# Patient Record
Sex: Male | Born: 1995 | Race: Black or African American | Hispanic: No | Marital: Single | Smoking: Current some day smoker
Health system: Southern US, Community
[De-identification: ages and names within clinical notes are randomized; demographics above are authoritative.]

## PROBLEM LIST (undated history)

## (undated) DIAGNOSIS — F32A Depression, unspecified: Secondary | ICD-10-CM

## (undated) DIAGNOSIS — Z931 Gastrostomy status: Secondary | ICD-10-CM

## (undated) HISTORY — DX: Gastrostomy status: Z93.1

---
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
## 2018-11-01 ENCOUNTER — Other Ambulatory Visit: Payer: Self-pay

## 2018-11-01 ENCOUNTER — Ambulatory Visit: Payer: Self-pay

## 2018-11-01 DIAGNOSIS — Z20822 Contact with and (suspected) exposure to covid-19: Secondary | ICD-10-CM

## 2018-11-02 LAB — NOVEL CORONAVIRUS, NAA: SARS-CoV-2, NAA: NOT DETECTED

## 2018-11-04 ENCOUNTER — Other Ambulatory Visit: Payer: Self-pay

## 2018-11-04 ENCOUNTER — Emergency Department
Admission: EM | Admit: 2018-11-04 | Discharge: 2018-11-04 | Disposition: A | Discharge status: Home / Self Care | Payer: Self-pay | Attending: Student in an Organized Health Care Education/Training Program | Admitting: Student in an Organized Health Care Education/Training Program

## 2018-11-04 ENCOUNTER — Encounter: Payer: Self-pay | Admitting: Emergency Medicine

## 2018-11-04 DIAGNOSIS — K0889 Other specified disorders of teeth and supporting structures: Secondary | ICD-10-CM | POA: Insufficient documentation

## 2018-11-04 MED ORDER — IBUPROFEN 600 MG PO TABS: 600.0000 mg | ORAL_TABLET | Freq: Three times a day (TID) | ORAL | 0 refills | Status: DC | PRN

## 2018-11-04 MED ORDER — IBUPROFEN 600 MG PO TABS
600.0000 mg | ORAL_TABLET | Freq: Once | ORAL | Status: AC
  Administered 2018-11-04: 11:00:00 600 mg via ORAL
  Filled 2018-11-04: qty 1

## 2018-11-04 MED ORDER — AMOXICILLIN 875 MG PO TABS: 875.0000 mg | ORAL_TABLET | Freq: Two times a day (BID) | ORAL | 0 refills | Status: DC

## 2018-11-04 NOTE — ED Triage Notes (Signed)
Pt arrives with complaints of dental pain and possible infection for the last week.

## 2018-11-04 NOTE — ED Provider Notes (Signed)
[facility] Emergency Department Provider Note   ____________________________________________   First MD Initiated Contact with Patient 11/04/18 1005     (approximate)  I have reviewed the triage vital signs and the nursing notes.   HISTORY  Chief Complaint Dental Pain   HPI Jeremy D Polo RileyWillis Jr. is a 23 y.o. male presents to the ED with complaint of dental pain.  Patient states that he chipped his tooth 2 months ago but has not seen a dentist since that time.  He now complains of pain and swelling in the gum surrounding this particular tooth.  He denies any fever or chills.  He continues to smoke cigarettes daily.  He rates his pain as 10/10.      History reviewed. No pertinent past medical history.  There are no active problems to display for this patient.   History reviewed. No pertinent surgical history.  Prior to Admission medications   Medication Sig Start Date End Date Taking? Authorizing Provider  amoxicillin (AMOXIL) 875 MG tablet Take 1 tablet (875 mg total) by mouth 2 (two) times daily. 11/04/18   Tommi RumpsSummers, Gunnison Chahal L, PA-C  ibuprofen (ADVIL) 600 MG tablet Take 1 tablet (600 mg total) by mouth every 8 (eight) hours as needed. 11/04/18   Tommi RumpsSummers, Ariea Rochin L, PA-C    Allergies Patient has no allergy information on record.  No family history on file.  Social History Social History   Tobacco Use  . Smoking status: Not on file  Substance Use Topics  . Alcohol use: Not on file  . Drug use: Not on file    Review of Systems Constitutional: No fever/chills Eyes: No visual changes. ENT: No sore throat.  Positive for dental pain. Cardiovascular: Denies chest pain. Respiratory: Denies shortness of breath. Musculoskeletal: Negative for muscle aches. Skin: Negative for rash. Neurological: Negative for headaches. ___________________________________________   PHYSICAL EXAM:  VITAL SIGNS: ED Triage Vitals  Enc Vitals Group     BP  11/04/18 0953 133/82     Pulse Rate 11/04/18 0953 69     Resp 11/04/18 0953 18     Temp 11/04/18 0953 99 F (37.2 C)     Temp Source 11/04/18 0953 Oral     SpO2 11/04/18 0953 99 %     Weight 11/04/18 0954 140 lb (63.5 kg)     Height 11/04/18 0954 6\' 2"  (1.88 m)     Head Circumference --      Peak Flow --      Pain Score 11/04/18 0951 10     Pain Loc --      Pain Edu? --      Excl. in GC? --     Constitutional: Alert and oriented. Well appearing and in no acute distress. Eyes: Conjunctivae are normal.  Head: Atraumatic. Mouth/Throat: Right lower molar the anterior portion of the tooth is avulsed with a cary present.  No obvious abscess or drainage is noted.  Minimal swelling with the gum in this area. Neck: No stridor.   Cardiovascular: Normal rate, regular rhythm. Grossly normal heart sounds.  Good peripheral circulation. Respiratory: Normal respiratory effort.  No retractions. Lungs CTAB. Musculoskeletal: Moves upper and lower extremities with any difficulty.  Normal gait was noted. Neurologic:  Normal speech and language. No gross focal neurologic deficits are appreciated.  Skin:  Skin is warm, dry and intact.  Psychiatric: Mood and affect are normal. Speech and behavior are normal.  ____________________________________________   LABS (all labs ordered are listed, but  only abnormal results are displayed)  Labs Reviewed - No data to display ____________________________________________    PROCEDURES  Procedure(s) performed (including Critical Care):  Procedures  ___________________________________________   INITIAL IMPRESSION / ASSESSMENT AND PLAN / ED COURSE  As part of my medical decision making, I reviewed the following data within the electronic MEDICAL RECORD NUMBER Notes from prior ED visits and [place] Controlled Substance Database  23 year old male presents to the ED with complaint of a dental injury that occurred 2 months ago and has had increased pain over the last  2 days which he feels is because of infection.  Patient has continued to smoke daily.  On exam there is a partial avulsion and cavity present.  There is no obvious edema or drainage noted at this area.  Patient was given prescription for amoxicillin 875 twice daily for 10 days and ibuprofen to be taken as needed for pain.  He was given a list of dental clinics in the area and encouraged strongly to follow-up with 1 of these.   ____________________________________________   FINAL CLINICAL IMPRESSION(S) / ED DIAGNOSES  Final diagnoses:  Pain, dental     ED Discharge Orders         Ordered    amoxicillin (AMOXIL) 875 MG tablet  2 times daily     11/04/18 1044    ibuprofen (ADVIL) 600 MG tablet  Every 8 hours PRN,   Status:  Discontinued     11/04/18 1044    ibuprofen (ADVIL) 600 MG tablet  Every 8 hours PRN     11/04/18 1045           Note:  This document was prepared using Dragon voice recognition software and may include unintentional dictation errors.    Johnn Hai, PA-C 11/04/18 1123    Merlyn Lot, MD 11/04/18 1341

## 2018-11-04 NOTE — Discharge Instructions (Addendum)
Begin taking the antibiotic and the ibuprofen for your dental pain.  A list of dental clinics are listed on your discharge papers.  Also [facility] has walk-in hours and is listed on a separate piece of paper.  OPTIONS FOR DENTAL FOLLOW UP CARE  [place] Department of Health and Human Services - Local Safety Net Dental Clinics TripDoors.comhttp://www.ncdhhs.gov/dph/oralhealth/services/safetynetclinics.htm   [facility] [phone])  [facility] [phone])  [facility] [place] [phone])  [facility] [phone])  [facility] [phone]) This clinic caters to the indigent population and is on a lottery system. Location: [employer], [address], [place] Clinic Hours: Wednesdays from 6pm - 9pm, patients seen by a lottery system. For dates, call or go to [email or web address] Services: Cleanings, fillings and simple extractions. Payment Options: DENTAL WORK IS FREE OF CHARGE. Bring proof of income or support. Best way to get seen: Arrive at 5:15 pm - this is a lottery, NOT first come/first serve, so arriving earlier will not increase your chances of being seen.     [facility] [phone] Select option 1 for emergencies   Location: [facility], [facility], [address], [place] Clinic Hours: No walk-ins accepted - call the day before to schedule an appointment. Check in times are 9:30 am and 1:30 pm. Services: Simple extractions, temporary fillings, pulpectomy/pulp debridement, uncomplicated abscess drainage. Payment Options: PAYMENT IS DUE AT THE TIME OF SERVICE.  Fee is usually $100-200, additional surgical procedures (e.g. abscess drainage) may be extra. Cash, checks, Visa/MasterCard accepted.  Can file Medicaid if patient is covered for dental - patient should call case worker to check. No discount for [facility] patients. Best way to get seen: MUST call the day before and get onto the schedule. Can usually be seen the next 1-2 days. No walk-ins accepted.     [facility] [phone]   Location: [facility], [address], [place] Clinic Hours: M, W, Th, F 8am or 1:30pm, Tues 9a or 1:30 - first come/first served. Services: Simple extractions, temporary fillings, uncomplicated abscess drainage.  You do not need to be an [place] resident. Payment Options: PAYMENT IS DUE AT THE TIME OF SERVICE. Dental insurance, otherwise sliding scale - bring proof of income or support. Depending on income and treatment needed, cost is usually $50-200. Best way to get seen: Arrive early as it is first come/first served.     [facility] [phone]   Location: [address] Clinic Hours: Mon-Thu 8a-5p Services: Most basic dental services including extractions and fillings. Payment Options: PAYMENT IS DUE AT THE TIME OF SERVICE. Sliding scale, up to 50% off - bring proof if income or support. Medicaid with dental option accepted. Best way to get seen: Call to schedule an appointment, can usually be seen within 2 weeks OR they will try to see walk-ins - show up at 8a or 2p (you may have to wait).     [facility] [phone] [place] RESIDENTS ONLY   Location: [facility], [address], [place], [place] [id number] Clinic Hours: By appointment only. Monday - Thursday 8am-5pm, Friday 8am-12pm Services: Cleanings, fillings, extractions. Payment Options: PAYMENT IS DUE AT THE TIME OF SERVICE. Cash, Visa or MasterCard. Sliding scale - $30 minimum per service. Best way to get seen: Come in to office, complete packet and make an appointment - need  proof of income or support monies for each household member and proof of [facility] residence. Usually takes  about a month to get in.     [facility] [phone]   Location: [address], [place] Clinic Hours: Walk-in Urgent Care Dental Services are offered Monday-Friday mornings only. The numbers of emergencies accepted daily is limited to the number of providers available. Maximum 15 - Mondays, Wednesdays & Thursdays Maximum 10 - Tuesdays & Fridays Services: You do not need to be a [facility] resident to be seen for a dental emergency. Emergencies are defined as pain, swelling, abnormal bleeding, or dental trauma. Walkins will receive x-rays if needed. NOTE: Dental cleaning is not an emergency. Payment Options: PAYMENT IS DUE AT THE TIME OF SERVICE. Minimum co-pay is $40.00 for uninsured patients. Minimum co-pay is $3.00 for Medicaid with dental coverage. Dental Insurance is accepted and must be presented at time of visit. Medicare does not cover dental. Forms of payment: Cash, credit card, checks. Best way to get seen: If not previously registered with the clinic, walk-in dental registration begins at 7:15 am and is on a first come/first serve basis. If previously registered with the clinic, call to make an appointment.     [facility] [place] ONLY   Location: [address] [place], [place] Clinic Hours: Mon-Thu 10a-2p Services: Extractions only! Payment Options: FREE (donations accepted) - bring proof of income or support Best way to get seen: Call and schedule an appointment OR come at 8am on the 1st Monday of every month (except for holidays) when it is first come/first served.     [facility] [phone]   Location: [place], [place] Clinic Hours: Friday mornings Services, Payment Options, Best way to get seen: Call for info

## 2018-11-04 NOTE — ED Notes (Signed)
See triage note  Presents with dental pain   Possible dental abscess  States he chipped a tooth about 2 months ago  But developed increased pain with some gum swelling couple of days ago

## 2018-11-05 ENCOUNTER — Ambulatory Visit: Payer: Self-pay

## 2018-11-06 ENCOUNTER — Ambulatory Visit: Payer: Self-pay

## 2019-07-22 ENCOUNTER — Emergency Department
Admission: EM | Admit: 2019-07-22 | Discharge: 2019-07-22 | Disposition: A | Discharge status: Home / Self Care | Payer: Self-pay | Attending: Emergency Medicine | Admitting: Emergency Medicine

## 2019-07-22 ENCOUNTER — Other Ambulatory Visit: Payer: Self-pay

## 2019-07-22 ENCOUNTER — Encounter: Payer: Self-pay | Admitting: Emergency Medicine

## 2019-07-22 DIAGNOSIS — K047 Periapical abscess without sinus: Secondary | ICD-10-CM

## 2019-07-22 DIAGNOSIS — F17210 Nicotine dependence, cigarettes, uncomplicated: Secondary | ICD-10-CM | POA: Insufficient documentation

## 2019-07-22 LAB — CBC WITH DIFFERENTIAL/PLATELET
Abs Immature Granulocytes: 0.03 10*3/uL (ref 0.00–0.07)
Basophils Absolute: 0 10*3/uL (ref 0.0–0.1)
Basophils Relative: 1 %
Eosinophils Absolute: 0.1 10*3/uL (ref 0.0–0.5)
Eosinophils Relative: 2 %
HCT: 45.5 % (ref 39.0–52.0)
Hemoglobin: 15.3 g/dL (ref 13.0–17.0)
Immature Granulocytes: 1 %
Lymphocytes Relative: 30 %
Lymphs Abs: 1.7 10*3/uL (ref 0.7–4.0)
MCH: 28.4 pg (ref 26.0–34.0)
MCHC: 33.6 g/dL (ref 30.0–36.0)
MCV: 84.6 fL (ref 80.0–100.0)
Monocytes Absolute: 0.7 10*3/uL (ref 0.1–1.0)
Monocytes Relative: 12 %
Neutro Abs: 3.2 10*3/uL (ref 1.7–7.7)
Neutrophils Relative %: 54 %
Platelets: 235 10*3/uL (ref 150–400)
RBC: 5.38 MIL/uL (ref 4.22–5.81)
RDW: 13.1 % (ref 11.5–15.5)
WBC: 5.8 10*3/uL (ref 4.0–10.5)
nRBC: 0 % (ref 0.0–0.2)

## 2019-07-22 LAB — BASIC METABOLIC PANEL
Anion gap: 8 (ref 5–15)
BUN: 10 mg/dL (ref 6–20)
CO2: 25 mmol/L (ref 22–32)
Calcium: 9 mg/dL (ref 8.9–10.3)
Chloride: 106 mmol/L (ref 98–111)
Creatinine, Ser: 0.83 mg/dL (ref 0.61–1.24)
GFR calc Af Amer: >60 mL/min (ref >60)
GFR calc non Af Amer: >60 mL/min (ref >60)
Glucose, Bld: 92 mg/dL (ref 70–99)
Potassium: 4.2 mmol/L (ref 3.5–5.1)
Sodium: 139 mmol/L (ref 135–145)

## 2019-07-22 MED ORDER — SODIUM CHLORIDE 0.9 % IV SOLN
1.0000 g | Freq: Once | INTRAVENOUS | Status: AC
  Administered 2019-07-22: 1 g via INTRAVENOUS
  Filled 2019-07-22: qty 10

## 2019-07-22 MED ORDER — HYDROCODONE-ACETAMINOPHEN 5-325 MG PO TABS: 1.0000 | ORAL_TABLET | Freq: Four times a day (QID) | ORAL | 0 refills | Status: DC | PRN

## 2019-07-22 MED ORDER — CLINDAMYCIN HCL 150 MG PO CAPS
300.0000 mg | ORAL_CAPSULE | Freq: Three times a day (TID) | ORAL | 0 refills | Status: DC
Start: 2019-07-22 — End: 2020-02-12

## 2019-07-22 NOTE — ED Triage Notes (Signed)
Pt here for jaw swelling.  Reports woke up this way.  Hx of dental problems on this side and has needed abx in past.  Has not went to dentist for teeth causing problems.  No fever. No vision changes.

## 2019-07-22 NOTE — Discharge Instructions (Addendum)
OPTIONS FOR DENTAL FOLLOW UP CARE ° °[place] Department of Health and Human Services - Local Safety Net Dental Clinics °http://www.ncdhhs.gov/dph/oralhealth/services/safetynetclinics.htm °  °[facility] ([phone]) ° °[facility] ([phone]) ° °[facility] ([phone]) ° °[facility] ([phone]) ° °[facility] ([phone]) °This clinic caters to the indigent population and is on a lottery system. °Location: °[facility], [facility], [address], [place] °Clinic Hours: °Wednesdays from 6pm - 9pm, patients seen by a lottery system. °For dates, call or go to [email or web address] °Services: °Cleanings, fillings and simple extractions. °Payment Options: °DENTAL WORK IS FREE OF CHARGE. Bring proof of income or support. °Best way to get seen: °Arrive at 5:15 pm - this is a lottery, NOT first come/first serve, so arriving earlier will not increase your chances of being seen. °  °  °[facility] °[phone] °Select option 1 for emergencies °  °Location: °[facility], [address], [place] °Clinic Hours: °No walk-ins accepted - call the day before to schedule an appointment. °Check in times are 9:30 am and 1:30 pm. °Services: °Simple extractions, temporary fillings, pulpectomy/pulp debridement, uncomplicated abscess drainage. °Payment Options: °PAYMENT IS DUE AT THE TIME OF SERVICE.  Fee is usually $100-200, additional surgical procedures (e.g. abscess drainage) may be extra. °Cash, checks, Visa/MasterCard accepted.  Can file Medicaid if patient is covered for dental - patient should call case worker to check. °No discount for [employer] patients. °Best way to get seen: °MUST call the day before and get onto the schedule. Can usually be seen the next 1-2 days. No walk-ins accepted. °  °  °[facility] °[phone] °   °Location: °[facility], [address], [place] °Clinic Hours: °M, W, Th, F 8am or 1:30pm, Tues 9a or 1:30 - first come/first served. °Services: °Simple extractions, temporary fillings, uncomplicated abscess drainage.  You do not need to be an [place] resident. °Payment Options: °PAYMENT IS DUE AT THE TIME OF SERVICE. °Dental insurance, otherwise sliding scale - bring proof of income or support. °Depending on income and treatment needed, cost is usually $50-200. °Best way to get seen: °Arrive early as it is first come/first served. °  °  °[facility] °[phone] °  °Location: °[address] °Clinic Hours: °Mon-Thu 8a-5p °Services: °Most basic dental services including extractions and fillings. °Payment Options: °PAYMENT IS DUE AT THE TIME OF SERVICE. °Sliding scale, up to 50% off - bring proof if income or support. °Medicaid with dental option accepted. °Best way to get seen: °Call to schedule an appointment, can usually be seen within 2 weeks OR they will try to see walk-ins - show up at 8a or 2p (you may have to wait). °  °  °[facility] °[phone] °[place] RESIDENTS ONLY °  °Location: °[facility], [address], [place],  [address] °Clinic Hours: By appointment only. °Monday - Thursday 8am-5pm, Friday 8am-12pm °Services: Cleanings, fillings, extractions. °Payment Options: °PAYMENT IS DUE AT THE TIME OF SERVICE. °Cash, Visa or MasterCard. Sliding scale - $30 minimum per service. °Best way to get seen: °Come in to office, complete packet and make an appointment - need proof of income °or support monies for each household member and proof of [place] residence. °Usually takes about a month to get in. °  °  °[facility] °[phone] °  °Location: °[address]   [place] °Clinic Hours: Walk-in Urgent Care Dental Services are offered Monday-Friday  mornings only. °The numbers of emergencies accepted daily is limited to the number of °providers available. °Maximum 15 - Mondays, Wednesdays & Thursdays °Maximum 10 - Tuesdays & Fridays °Services: °You do not need to be a [place] resident to be seen for a dental emergency. °Emergencies are defined as pain, swelling, abnormal bleeding, or dental trauma. Walkins will receive x-rays if needed. °NOTE: Dental cleaning is not an emergency. °Payment Options: °PAYMENT IS DUE AT THE TIME OF SERVICE. °Minimum co-pay is $40.00 for uninsured patients. °Minimum co-pay is $3.00 for Medicaid with dental coverage. °Dental Insurance is accepted and must be presented at time of visit. °Medicare does not cover dental. °Forms of payment: Cash, credit card, checks. °Best way to get seen: °If not previously registered with the clinic, walk-in dental registration begins at 7:15 am and is on a first come/first serve basis. °If previously registered with the clinic, call to make an appointment. °  °  °[facility] °[phone] °[place] RESIDENTS ONLY °  °Location: °[address], [place],  °Clinic Hours: °Mon-Thu 10a-2p °Services: Extractions only! °Payment Options: °FREE (donations accepted) - bring proof of income or support °Best way to get seen: °Call and schedule an appointment OR come at 8am on the 1st Monday of every month (except for holidays) when it is first come/first served. °  °  °[facility] °[phone] °  °Location: °[address], [place] °Clinic Hours: °Friday mornings °Services, Payment Options, Best way to get seen: °Call for info °

## 2019-07-22 NOTE — ED Provider Notes (Signed)
[facility] Emergency Department Provider Note  ____________________________________________   First MD Initiated Contact with Patient 07/22/19 1035     (approximate)  I have reviewed the triage vital signs and the nursing notes.   HISTORY  Chief Complaint Dental Problem    HPI Jeremy George. is a 24 y.o. male presents emergency department complaining of a swollen face due to a cracked tooth.  States been on amoxicillin for 2 days and swelling has gotten worse.  He denies any fever or chills.   No pain at this time   History reviewed. No pertinent past medical history.  There are no problems to display for this patient.   History reviewed. No pertinent surgical history.  Prior to Admission medications   Medication Sig Start Date End Date Taking? Authorizing Provider  clindamycin (CLEOCIN) 150 MG capsule Take 2 capsules (300 mg total) by mouth 3 (three) times daily. 07/22/19   Oiva Dibari, Roselyn Bering, PA-C  HYDROcodone-acetaminophen (NORCO/VICODIN) 5-325 MG tablet Take 1 tablet by mouth every 6 (six) hours as needed for moderate pain. 07/22/19   Faythe Ghee, PA-C    Allergies Tramadol  History reviewed. No pertinent family history.  Social History Social History   Tobacco Use  . Smoking status: Current Every Day Smoker  . Smokeless tobacco: Never Used  Substance Use Topics  . Alcohol use: Yes  . Drug use: Yes    Types: Marijuana    Review of Systems  Constitutional: No fever/chills Eyes: No visual changes. ENT: No sore throat.  Positive facial swelling Respiratory: Denies cough Cardiovascular: Denies chest pain Gastrointestinal: Denies abdominal pain Genitourinary: Negative for dysuria. Musculoskeletal: Negative for back pain. Skin: Negative for rash. Psychiatric: no mood changes,     ____________________________________________   PHYSICAL EXAM:  VITAL SIGNS: ED Triage Vitals  Enc Vitals Group     BP 07/22/19 1026  108/75     Pulse Rate 07/22/19 1025 93     Resp 07/22/19 1025 16     Temp 07/22/19 1025 98.9 F (37.2 C)     Temp Source 07/22/19 1025 Oral     SpO2 07/22/19 1025 98 %     Weight 07/22/19 1025 140 lb (63.5 kg)     Height 07/22/19 1025 6\' 2"  (1.88 m)     Head Circumference --      Peak Flow --      Pain Score 07/22/19 1025 0     Pain Loc --      Pain Edu? --      Excl. in GC? --     Constitutional: Alert and oriented. Well appearing and in no acute distress. Eyes: Conjunctivae are normal.  Head: Atraumatic.  Large swelling noted to the great side of the face along the right jaw maxillary area Nose: No congestion/rhinnorhea. Mouth/Throat: Mucous membranes are moist.  Poor dentition noted, swelling noted at the gumline and into the right cheek Neck:  supple no lymphadenopathy noted Cardiovascular: Normal rate, regular rhythm. Heart sounds are normal Respiratory: Normal respiratory effort.  No retractions, lungs c t a  GU: deferred Musculoskeletal: FROM all extremities, warm and well perfused Neurologic:  Normal speech and language.  Skin:  Skin is warm, dry and intact. No rash noted. Psychiatric: Mood and affect are normal. Speech and behavior are normal.  ____________________________________________   LABS (all labs ordered are listed, but only abnormal results are displayed)  Labs Reviewed  CBC WITH DIFFERENTIAL/PLATELET  BASIC METABOLIC PANEL   ____________________________________________  ____________________________________________  RADIOLOGY    ____________________________________________   PROCEDURES  Procedure(s) performed: No  Procedures    ____________________________________________   INITIAL IMPRESSION / ASSESSMENT AND PLAN / ED COURSE  Pertinent labs & imaging results that were available during my care of the patient were reviewed by me and considered in my medical decision making (see chart for details).   Patient is 24 year old male  presents emergency department with concerns of dental abscess.  Is been taking amoxicillin for 2 days.  No relief.  See HPI.  Physical exam is consistent with a large dental abscess.  The right side of the face is grossly swollen.  No lymphadenopathy noted in the cervical chain.  Remainder exams are unremarkable  Due to the amount of infection and the fact that the patient has been on amoxicillin we will do IV Rocephin.  Labs ordered for CBC and metabolic panel.  CBC metabolic panel normal which are reassuring.  Is given a prescription for clindamycin and hydrocodone.  He is to follow-up his regular doctor if not improving in 2 to 3 days.  Return emergency department worsening.  Encouraged him to follow-up with one of the discounted dental clinics and a list was provided for him.  He was discharged stable condition.   Jeremy George. was evaluated in Emergency Department on 07/22/2019 for the symptoms described in the history of present illness. He was evaluated in the context of the global COVID-19 pandemic, which necessitated consideration that the patient might be at risk for infection with the SARS-CoV-2 virus that causes COVID-19. Institutional protocols and algorithms that pertain to the evaluation of patients at risk for COVID-19 are in a state of rapid change based on information released by regulatory bodies including the CDC and federal and state organizations. These policies and algorithms were followed during the patient's care in the ED.   As part of my medical decision making, I reviewed the following data within the [place] notes reviewed and incorporated, Labs reviewed , Old chart reviewed, Notes from prior ED visits and [place] Controlled Substance Database  ____________________________________________   FINAL CLINICAL IMPRESSION(S) / ED DIAGNOSES  Final diagnoses:  Dental abscess      NEW MEDICATIONS STARTED DURING THIS VISIT:  New Prescriptions     CLINDAMYCIN (CLEOCIN) 150 MG CAPSULE    Take 2 capsules (300 mg total) by mouth 3 (three) times daily.   HYDROCODONE-ACETAMINOPHEN (NORCO/VICODIN) 5-325 MG TABLET    Take 1 tablet by mouth every 6 (six) hours as needed for moderate pain.     Note:  This document was prepared using Dragon voice recognition software and may include unintentional dictation errors.    Versie Starks, PA-C 07/22/19 1131    Harvest Dark, MD 07/22/19 [phone]

## 2019-07-22 NOTE — ED Notes (Signed)
See triage note  States he has a cracked tooth   Woke up with possible  Facial swelling

## 2019-10-23 ENCOUNTER — Other Ambulatory Visit: Payer: Self-pay

## 2019-10-23 ENCOUNTER — Other Ambulatory Visit: Payer: Self-pay | Admitting: Sleep Medicine

## 2019-10-23 DIAGNOSIS — I471 Supraventricular tachycardia, unspecified: Secondary | ICD-10-CM

## 2019-10-25 LAB — NOVEL CORONAVIRUS, NAA: SARS-CoV-2, NAA: NOT DETECTED

## 2020-02-12 ENCOUNTER — Emergency Department: Payer: Self-pay

## 2020-02-12 ENCOUNTER — Other Ambulatory Visit: Payer: Self-pay

## 2020-02-12 ENCOUNTER — Emergency Department
Admission: EM | Admit: 2020-02-12 | Discharge: 2020-02-12 | Disposition: A | Discharge status: Home / Self Care | Payer: Self-pay | Attending: Emergency Medicine | Admitting: Emergency Medicine

## 2020-02-12 DIAGNOSIS — R4182 Altered mental status, unspecified: Secondary | ICD-10-CM | POA: Insufficient documentation

## 2020-02-12 DIAGNOSIS — Z789 Other specified health status: Secondary | ICD-10-CM

## 2020-02-12 DIAGNOSIS — F17200 Nicotine dependence, unspecified, uncomplicated: Secondary | ICD-10-CM | POA: Insufficient documentation

## 2020-02-12 DIAGNOSIS — F1290 Cannabis use, unspecified, uncomplicated: Secondary | ICD-10-CM | POA: Insufficient documentation

## 2020-02-12 DIAGNOSIS — F1099 Alcohol use, unspecified with unspecified alcohol-induced disorder: Secondary | ICD-10-CM | POA: Insufficient documentation

## 2020-02-12 LAB — COMPREHENSIVE METABOLIC PANEL WITH GFR
ALT: 20 U/L (ref 0–44)
AST: 30 U/L (ref 15–41)
Albumin: 4.4 g/dL (ref 3.5–5.0)
Alkaline Phosphatase: 62 U/L (ref 38–126)
Anion gap: 10 (ref 5–15)
BUN: 10 mg/dL (ref 6–20)
CO2: 24 mmol/L (ref 22–32)
Calcium: 8.9 mg/dL (ref 8.9–10.3)
Chloride: 108 mmol/L (ref 98–111)
Creatinine, Ser: 0.79 mg/dL (ref 0.61–1.24)
GFR, Estimated: >60 mL/min
Glucose, Bld: 96 mg/dL (ref 70–99)
Potassium: 3.9 mmol/L (ref 3.5–5.1)
Sodium: 142 mmol/L (ref 135–145)
Total Bilirubin: 0.7 mg/dL (ref 0.3–1.2)
Total Protein: 7.6 g/dL (ref 6.5–8.1)

## 2020-02-12 LAB — CBC WITH DIFFERENTIAL/PLATELET
Abs Immature Granulocytes: 0.03 K/uL (ref 0.00–0.07)
Basophils Absolute: 0 K/uL (ref 0.0–0.1)
Basophils Relative: 1 %
Eosinophils Absolute: 0.1 K/uL (ref 0.0–0.5)
Eosinophils Relative: 2 %
HCT: 43.6 % (ref 39.0–52.0)
Hemoglobin: 14.6 g/dL (ref 13.0–17.0)
Immature Granulocytes: 1 %
Lymphocytes Relative: 34 %
Lymphs Abs: 1.6 K/uL (ref 0.7–4.0)
MCH: 28.9 pg (ref 26.0–34.0)
MCHC: 33.5 g/dL (ref 30.0–36.0)
MCV: 86.2 fL (ref 80.0–100.0)
Monocytes Absolute: 0.4 K/uL (ref 0.1–1.0)
Monocytes Relative: 7 %
Neutro Abs: 2.7 K/uL (ref 1.7–7.7)
Neutrophils Relative %: 55 %
Platelets: 234 K/uL (ref 150–400)
RBC: 5.06 MIL/uL (ref 4.22–5.81)
RDW: 13.5 % (ref 11.5–15.5)
WBC: 4.8 K/uL (ref 4.0–10.5)
nRBC: 0 % (ref 0.0–0.2)

## 2020-02-12 LAB — URINE DRUG SCREEN, QUALITATIVE (ARMC ONLY)
Amphetamines, Ur Screen: NOT DETECTED
Barbiturates, Ur Screen: NOT DETECTED
Benzodiazepine, Ur Scrn: NOT DETECTED
Cannabinoid 50 Ng, Ur ~~LOC~~: POSITIVE — AB
Cocaine Metabolite,Ur ~~LOC~~: NOT DETECTED
MDMA (Ecstasy)Ur Screen: NOT DETECTED
Methadone Scn, Ur: NOT DETECTED
Opiate, Ur Screen: NOT DETECTED
Phencyclidine (PCP) Ur S: NOT DETECTED
Tricyclic, Ur Screen: NOT DETECTED

## 2020-02-12 LAB — ETHANOL: Alcohol, Ethyl (B): 191 mg/dL — ABNORMAL HIGH

## 2020-02-12 NOTE — ED Notes (Signed)
[name] is at bedside, pt now in agreement with work up as planned

## 2020-02-12 NOTE — Discharge Instructions (Addendum)
Please seek medical attention for any high fevers, chest pain, shortness of breath, change in behavior, persistent vomiting, bloody stool or any other new or concerning symptoms.  

## 2020-02-12 NOTE — ED Notes (Signed)
Pt refusing to go get CT done; Dr. Derrill Kay notified

## 2020-02-12 NOTE — ED Notes (Signed)
Pt able to ambulate to toilet in room, provided urine specimen

## 2020-02-12 NOTE — ED Notes (Signed)
Pt given gingerale and graham crackers, encouraged to try some

## 2020-02-12 NOTE — ED Provider Notes (Signed)
[facility] Emergency Department Provider Note    ____________________________________________   I have reviewed the triage vital signs and the nursing notes.   HISTORY  Chief Complaint Altered Mental Status   History limited by and level 5 caveat due to: Altered Mental Status   HPI Jeremy George. is a 24 y.o. male who presents to the emergency department today via EMS from work because of concern for altered mental status. The patient himself either will not or cannot give any significant history. No similar presentations in [facility] EMR. Per report patient was given narcan during transport without any significant change.   Records reviewed. Per medical record review patient has been seen in the ED for dental pain but no recent record of any altered mental status.   Prior to Admission medications   Medication Sig Start Date End Date Taking? Authorizing Provider  clindamycin (CLEOCIN) 150 MG capsule Take 2 capsules (300 mg total) by mouth 3 (three) times daily. 07/22/19   Fisher, Roselyn Bering, PA-C  HYDROcodone-acetaminophen (NORCO/VICODIN) 5-325 MG tablet Take 1 tablet by mouth every 6 (six) hours as needed for moderate pain. 07/22/19   Faythe Ghee, PA-C    Allergies Tramadol  No family history on file.  Social History Social History   Tobacco Use   Smoking status: Current Every Day Smoker   Smokeless tobacco: Never Used  Substance Use Topics   Alcohol use: Yes   Drug use: Yes    Types: Marijuana    Review of Systems Unable to obtain secondary to altered mental status.  ____________________________________________   PHYSICAL EXAM:  VITAL SIGNS: ED Triage Vitals  Enc Vitals Group     BP 02/12/20 1503 112/70     Pulse Rate 02/12/20 1503 74     Resp 02/12/20 1503 16     Temp 02/12/20 1503 97.7 F (36.5 C)     Temp Source 02/12/20 1503 Oral     SpO2 02/12/20 1503 96 %     Weight --      Height 02/12/20 1506 6\' 2"  (1.88 m)      Head Circumference --      Peak Flow --      Pain Score 02/12/20 1506 0   Constitutional: Somnolent, will awaken to physical stimuli.   Eyes: Conjunctivae are normal. Pupils dilated and reactive.  ENT      Head: Normocephalic and atraumatic.      Nose: No congestion/rhinnorhea.      Mouth/Throat: Mucous membranes are moist.      Neck: No stridor. Hematological/Lymphatic/Immunilogical: No cervical lymphadenopathy. Cardiovascular: Normal rate, regular rhythm.  No murmurs, rubs, or gallops.  Respiratory: Normal respiratory effort without tachypnea nor retractions. Breath sounds are clear and equal bilaterally. No wheezes/rales/rhonchi. Gastrointestinal: Soft and non tender. No rebound. No guarding.  Genitourinary: Deferred Musculoskeletal: Normal range of motion in all extremities. No lower extremity edema. Neurologic:  Somnolent. Responds and awakens to physical stimuli. Will move all extremities. Skin:  Skin is warm, dry and intact. No rash noted.  ____________________________________________    LABS (pertinent positives/negatives)  UDS positive cannabinoid Ethanol 191 CMP wnl CBC wbc 4.8, hgb 14.6, plt 234   ____________________________________________    RADIOLOGY  CT head Normal non contrast head ct  ____________________________________________   PROCEDURES  Procedures  ____________________________________________   INITIAL IMPRESSION / ASSESSMENT AND PLAN / ED COURSE  Pertinent labs & imaging results that were available during my care of the patient were reviewed by me  and considered in my medical decision making (see chart for details).   Patient presented to the emergency department today because of concerns for altered mental status.  Patient was not able to give a good history when he first arrived.  Because this broad altered mental status work-up was initiated.  Head CT was negative for any acute bleed or mass.  Patient's blood work was notable for  elevated ethanol.  UDS positive for cannabinoid.  This point do think polysubstance use could explain the patient's symptoms. Patient was observed in the emergency department for a number of hours. Did become more responsive. Did try to encourage decreased substance use. Will give PCP follow up information.   ____________________________________________   FINAL CLINICAL IMPRESSION(S) / ED DIAGNOSES  Final diagnoses:  Altered mental status, unspecified altered mental status type  Alcohol use  Marijuana use     Note: This dictation was prepared with Dragon dictation. Any transcriptional errors that result from this process are unintentional     Phineas Semen, MD 02/12/20 2237

## 2020-02-12 NOTE — ED Notes (Signed)
Labs recollected and sent to lab 

## 2020-02-12 NOTE — ED Triage Notes (Signed)
Pt found to be with altered mental status at work, on EMS arrival is able to open eyes by not willing to participate in cares, not talking. Received 1mg  Narcan en route

## 2020-02-12 NOTE — ED Notes (Signed)
Patient transported to CT 

## 2020-03-29 ENCOUNTER — Inpatient Hospital Stay (HOSPITAL_COMMUNITY)
Admission: EM | Admit: 2020-03-29 | Discharge: 2020-04-27 | DRG: 003 | Disposition: A | Discharge status: Rehabilitation Facility | Payer: Medicaid Other | Attending: General Surgery | Admitting: General Surgery

## 2020-03-29 ENCOUNTER — Encounter (HOSPITAL_COMMUNITY): Admission: EM | Disposition: A | Discharge status: Rehabilitation Facility | Payer: Self-pay | Source: Home / Self Care

## 2020-03-29 ENCOUNTER — Emergency Department (HOSPITAL_COMMUNITY): Payer: Medicaid Other

## 2020-03-29 ENCOUNTER — Encounter (HOSPITAL_COMMUNITY): Payer: Self-pay

## 2020-03-29 ENCOUNTER — Inpatient Hospital Stay (HOSPITAL_COMMUNITY): Payer: Medicaid Other

## 2020-03-29 ENCOUNTER — Inpatient Hospital Stay (HOSPITAL_COMMUNITY): Payer: Medicaid Other | Admitting: Anesthesiology

## 2020-03-29 DIAGNOSIS — S01511A Laceration without foreign body of lip, initial encounter: Secondary | ICD-10-CM

## 2020-03-29 DIAGNOSIS — T1490XA Injury, unspecified, initial encounter: Secondary | ICD-10-CM

## 2020-03-29 DIAGNOSIS — R451 Restlessness and agitation: Secondary | ICD-10-CM | POA: Diagnosis not present

## 2020-03-29 DIAGNOSIS — J939 Pneumothorax, unspecified: Secondary | ICD-10-CM

## 2020-03-29 DIAGNOSIS — S42402A Unspecified fracture of lower end of left humerus, initial encounter for closed fracture: Secondary | ICD-10-CM

## 2020-03-29 DIAGNOSIS — Z931 Gastrostomy status: Secondary | ICD-10-CM

## 2020-03-29 DIAGNOSIS — R140 Abdominal distension (gaseous): Secondary | ICD-10-CM

## 2020-03-29 DIAGNOSIS — S02412A LeFort II fracture, initial encounter for closed fracture: Secondary | ICD-10-CM

## 2020-03-29 DIAGNOSIS — J9690 Respiratory failure, unspecified, unspecified whether with hypoxia or hypercapnia: Secondary | ICD-10-CM

## 2020-03-29 DIAGNOSIS — S02413A LeFort III fracture, initial encounter for closed fracture: Secondary | ICD-10-CM

## 2020-03-29 DIAGNOSIS — T07XXXA Unspecified multiple injuries, initial encounter: Principal | ICD-10-CM

## 2020-03-29 DIAGNOSIS — S0121XA Laceration without foreign body of nose, initial encounter: Secondary | ICD-10-CM

## 2020-03-29 DIAGNOSIS — S73015A Posterior dislocation of left hip, initial encounter: Secondary | ICD-10-CM

## 2020-03-29 DIAGNOSIS — T68XXXA Hypothermia, initial encounter: Secondary | ICD-10-CM

## 2020-03-29 DIAGNOSIS — S42402B Unspecified fracture of lower end of left humerus, initial encounter for open fracture: Secondary | ICD-10-CM

## 2020-03-29 DIAGNOSIS — Z930 Tracheostomy status: Secondary | ICD-10-CM

## 2020-03-29 DIAGNOSIS — S42412B Displaced simple supracondylar fracture without intercondylar fracture of left humerus, initial encounter for open fracture: Secondary | ICD-10-CM

## 2020-03-29 DIAGNOSIS — R739 Hyperglycemia, unspecified: Secondary | ICD-10-CM

## 2020-03-29 DIAGNOSIS — Z452 Encounter for adjustment and management of vascular access device: Secondary | ICD-10-CM

## 2020-03-29 DIAGNOSIS — S01412A Laceration without foreign body of left cheek and temporomandibular area, initial encounter: Secondary | ICD-10-CM

## 2020-03-29 DIAGNOSIS — D62 Acute posthemorrhagic anemia: Secondary | ICD-10-CM

## 2020-03-29 DIAGNOSIS — Z419 Encounter for procedure for purposes other than remedying health state, unspecified: Secondary | ICD-10-CM

## 2020-03-29 DIAGNOSIS — T148XXA Other injury of unspecified body region, initial encounter: Secondary | ICD-10-CM

## 2020-03-29 DIAGNOSIS — S42001A Fracture of unspecified part of right clavicle, initial encounter for closed fracture: Secondary | ICD-10-CM

## 2020-03-29 DIAGNOSIS — S32409A Unspecified fracture of unspecified acetabulum, initial encounter for closed fracture: Secondary | ICD-10-CM

## 2020-03-29 DIAGNOSIS — S062X9A Diffuse traumatic brain injury with loss of consciousness of unspecified duration, initial encounter: Secondary | ICD-10-CM

## 2020-03-29 DIAGNOSIS — K9423 Gastrostomy malfunction: Secondary | ICD-10-CM

## 2020-03-29 DIAGNOSIS — D72829 Elevated white blood cell count, unspecified: Secondary | ICD-10-CM

## 2020-03-29 DIAGNOSIS — S0292XA Unspecified fracture of facial bones, initial encounter for closed fracture: Secondary | ICD-10-CM

## 2020-03-29 DIAGNOSIS — S52272C Monteggia's fracture of left ulna, initial encounter for open fracture type IIIA, IIIB, or IIIC: Secondary | ICD-10-CM

## 2020-03-29 DIAGNOSIS — J189 Pneumonia, unspecified organism: Secondary | ICD-10-CM

## 2020-03-29 DIAGNOSIS — J9600 Acute respiratory failure, unspecified whether with hypoxia or hypercapnia: Secondary | ICD-10-CM

## 2020-03-29 DIAGNOSIS — R0682 Tachypnea, not elsewhere classified: Secondary | ICD-10-CM

## 2020-03-29 DIAGNOSIS — S73005A Unspecified dislocation of left hip, initial encounter: Secondary | ICD-10-CM

## 2020-03-29 DIAGNOSIS — S32422A Displaced fracture of posterior wall of left acetabulum, initial encounter for closed fracture: Secondary | ICD-10-CM

## 2020-03-29 DIAGNOSIS — L8990 Pressure ulcer of unspecified site, unspecified stage: Secondary | ICD-10-CM | POA: Insufficient documentation

## 2020-03-29 DIAGNOSIS — R030 Elevated blood-pressure reading, without diagnosis of hypertension: Secondary | ICD-10-CM

## 2020-03-29 DIAGNOSIS — S32402A Unspecified fracture of left acetabulum, initial encounter for closed fracture: Secondary | ICD-10-CM

## 2020-03-29 DIAGNOSIS — S81012A Laceration without foreign body, left knee, initial encounter: Secondary | ICD-10-CM

## 2020-03-29 DIAGNOSIS — S270XXA Traumatic pneumothorax, initial encounter: Secondary | ICD-10-CM | POA: Diagnosis not present

## 2020-03-29 DIAGNOSIS — R402212 Coma scale, best verbal response, none, at arrival to emergency department: Secondary | ICD-10-CM | POA: Diagnosis present

## 2020-03-29 DIAGNOSIS — S022XXA Fracture of nasal bones, initial encounter for closed fracture: Secondary | ICD-10-CM | POA: Diagnosis present

## 2020-03-29 DIAGNOSIS — S2231XA Fracture of one rib, right side, initial encounter for closed fracture: Secondary | ICD-10-CM | POA: Diagnosis not present

## 2020-03-29 DIAGNOSIS — X31XXXA Exposure to excessive natural cold, initial encounter: Secondary | ICD-10-CM | POA: Diagnosis not present

## 2020-03-29 DIAGNOSIS — R569 Unspecified convulsions: Secondary | ICD-10-CM | POA: Diagnosis not present

## 2020-03-29 DIAGNOSIS — R402112 Coma scale, eyes open, never, at arrival to emergency department: Secondary | ICD-10-CM | POA: Diagnosis present

## 2020-03-29 DIAGNOSIS — S27322A Contusion of lung, bilateral, initial encounter: Secondary | ICD-10-CM | POA: Diagnosis not present

## 2020-03-29 DIAGNOSIS — S42422B Displaced comminuted supracondylar fracture without intercondylar fracture of left humerus, initial encounter for open fracture: Secondary | ICD-10-CM | POA: Diagnosis not present

## 2020-03-29 DIAGNOSIS — Z20822 Contact with and (suspected) exposure to covid-19: Secondary | ICD-10-CM | POA: Diagnosis present

## 2020-03-29 DIAGNOSIS — R9431 Abnormal electrocardiogram [ECG] [EKG]: Secondary | ICD-10-CM | POA: Diagnosis present

## 2020-03-29 DIAGNOSIS — I959 Hypotension, unspecified: Secondary | ICD-10-CM | POA: Diagnosis present

## 2020-03-29 DIAGNOSIS — K567 Ileus, unspecified: Secondary | ICD-10-CM | POA: Diagnosis not present

## 2020-03-29 DIAGNOSIS — S42001D Fracture of unspecified part of right clavicle, subsequent encounter for fracture with routine healing: Secondary | ICD-10-CM | POA: Diagnosis not present

## 2020-03-29 DIAGNOSIS — S0285XA Fracture of orbit, unspecified, initial encounter for closed fracture: Secondary | ICD-10-CM | POA: Diagnosis not present

## 2020-03-29 DIAGNOSIS — S06A0XD Traumatic brain compression without herniation, subsequent encounter: Secondary | ICD-10-CM | POA: Diagnosis not present

## 2020-03-29 DIAGNOSIS — J45909 Unspecified asthma, uncomplicated: Secondary | ICD-10-CM | POA: Diagnosis present

## 2020-03-29 DIAGNOSIS — E162 Hypoglycemia, unspecified: Secondary | ICD-10-CM | POA: Diagnosis present

## 2020-03-29 DIAGNOSIS — S0240FA Zygomatic fracture, left side, initial encounter for closed fracture: Secondary | ICD-10-CM | POA: Diagnosis present

## 2020-03-29 DIAGNOSIS — Y92410 Unspecified street and highway as the place of occurrence of the external cause: Secondary | ICD-10-CM | POA: Diagnosis not present

## 2020-03-29 DIAGNOSIS — S52022B Displaced fracture of olecranon process without intraarticular extension of left ulna, initial encounter for open fracture type I or II: Secondary | ICD-10-CM | POA: Diagnosis not present

## 2020-03-29 DIAGNOSIS — R6339 Other feeding difficulties: Secondary | ICD-10-CM | POA: Diagnosis not present

## 2020-03-29 DIAGNOSIS — S42402K Unspecified fracture of lower end of left humerus, subsequent encounter for fracture with nonunion: Secondary | ICD-10-CM | POA: Diagnosis not present

## 2020-03-29 DIAGNOSIS — N179 Acute kidney failure, unspecified: Secondary | ICD-10-CM | POA: Diagnosis not present

## 2020-03-29 DIAGNOSIS — S0240CA Maxillary fracture, right side, initial encounter for closed fracture: Secondary | ICD-10-CM | POA: Diagnosis not present

## 2020-03-29 DIAGNOSIS — J9601 Acute respiratory failure with hypoxia: Secondary | ICD-10-CM | POA: Diagnosis not present

## 2020-03-29 DIAGNOSIS — Z430 Encounter for attention to tracheostomy: Secondary | ICD-10-CM | POA: Diagnosis not present

## 2020-03-29 DIAGNOSIS — S066X9A Traumatic subarachnoid hemorrhage with loss of consciousness of unspecified duration, initial encounter: Secondary | ICD-10-CM | POA: Diagnosis present

## 2020-03-29 DIAGNOSIS — S022XXD Fracture of nasal bones, subsequent encounter for fracture with routine healing: Secondary | ICD-10-CM | POA: Diagnosis not present

## 2020-03-29 DIAGNOSIS — I10 Essential (primary) hypertension: Secondary | ICD-10-CM | POA: Diagnosis present

## 2020-03-29 DIAGNOSIS — E876 Hypokalemia: Secondary | ICD-10-CM | POA: Diagnosis present

## 2020-03-29 DIAGNOSIS — S73005D Unspecified dislocation of left hip, subsequent encounter: Secondary | ICD-10-CM | POA: Diagnosis not present

## 2020-03-29 DIAGNOSIS — F10239 Alcohol dependence with withdrawal, unspecified: Secondary | ICD-10-CM | POA: Diagnosis not present

## 2020-03-29 DIAGNOSIS — S0240DA Maxillary fracture, left side, initial encounter for closed fracture: Secondary | ICD-10-CM | POA: Diagnosis not present

## 2020-03-29 DIAGNOSIS — Z23 Encounter for immunization: Secondary | ICD-10-CM

## 2020-03-29 DIAGNOSIS — S02412D LeFort II fracture, subsequent encounter for fracture with routine healing: Secondary | ICD-10-CM | POA: Diagnosis not present

## 2020-03-29 DIAGNOSIS — R402312 Coma scale, best motor response, none, at arrival to emergency department: Secondary | ICD-10-CM | POA: Diagnosis not present

## 2020-03-29 DIAGNOSIS — R404 Transient alteration of awareness: Secondary | ICD-10-CM | POA: Diagnosis not present

## 2020-03-29 DIAGNOSIS — R456 Violent behavior: Secondary | ICD-10-CM | POA: Diagnosis not present

## 2020-03-29 DIAGNOSIS — S52272A Monteggia's fracture of left ulna, initial encounter for closed fracture: Secondary | ICD-10-CM | POA: Diagnosis present

## 2020-03-29 DIAGNOSIS — D696 Thrombocytopenia, unspecified: Secondary | ICD-10-CM | POA: Diagnosis present

## 2020-03-29 DIAGNOSIS — Y906 Blood alcohol level of 120-199 mg/100 ml: Secondary | ICD-10-CM | POA: Diagnosis present

## 2020-03-29 DIAGNOSIS — R1312 Dysphagia, oropharyngeal phase: Secondary | ICD-10-CM | POA: Diagnosis present

## 2020-03-29 DIAGNOSIS — S32059D Unspecified fracture of fifth lumbar vertebra, subsequent encounter for fracture with routine healing: Secondary | ICD-10-CM | POA: Diagnosis not present

## 2020-03-29 DIAGNOSIS — D75839 Thrombocytosis, unspecified: Secondary | ICD-10-CM | POA: Diagnosis present

## 2020-03-29 DIAGNOSIS — S06899A Other specified intracranial injury with loss of consciousness of unspecified duration, initial encounter: Secondary | ICD-10-CM | POA: Diagnosis not present

## 2020-03-29 DIAGNOSIS — L89892 Pressure ulcer of other site, stage 2: Secondary | ICD-10-CM | POA: Diagnosis not present

## 2020-03-29 DIAGNOSIS — S42021A Displaced fracture of shaft of right clavicle, initial encounter for closed fracture: Secondary | ICD-10-CM | POA: Diagnosis present

## 2020-03-29 DIAGNOSIS — S062X9D Diffuse traumatic brain injury with loss of consciousness of unspecified duration, subsequent encounter: Secondary | ICD-10-CM | POA: Diagnosis not present

## 2020-03-29 DIAGNOSIS — E559 Vitamin D deficiency, unspecified: Secondary | ICD-10-CM | POA: Diagnosis present

## 2020-03-29 DIAGNOSIS — S32059A Unspecified fracture of fifth lumbar vertebra, initial encounter for closed fracture: Secondary | ICD-10-CM | POA: Diagnosis not present

## 2020-03-29 DIAGNOSIS — R0902 Hypoxemia: Secondary | ICD-10-CM | POA: Diagnosis not present

## 2020-03-29 DIAGNOSIS — R1310 Dysphagia, unspecified: Secondary | ICD-10-CM | POA: Diagnosis not present

## 2020-03-29 DIAGNOSIS — R4189 Other symptoms and signs involving cognitive functions and awareness: Secondary | ICD-10-CM | POA: Diagnosis present

## 2020-03-29 DIAGNOSIS — F17210 Nicotine dependence, cigarettes, uncomplicated: Secondary | ICD-10-CM | POA: Diagnosis present

## 2020-03-29 DIAGNOSIS — R4020 Unspecified coma: Secondary | ICD-10-CM | POA: Diagnosis not present

## 2020-03-29 HISTORY — PX: I & D EXTREMITY: SHX5045

## 2020-03-29 HISTORY — PX: APPLICATION OF WOUND VAC: SHX5189

## 2020-03-29 HISTORY — PX: IRRIGATION AND DEBRIDEMENT KNEE: SHX5185

## 2020-03-29 HISTORY — PX: HIP CLOSED REDUCTION: SHX983

## 2020-03-29 HISTORY — PX: LACERATION REPAIR: SHX5284

## 2020-03-29 HISTORY — PX: EXTERNAL FIXATION LEG: SHX1549

## 2020-03-29 HISTORY — DX: Unspecified fracture of lower end of left humerus, initial encounter for closed fracture: S42.402A

## 2020-03-29 HISTORY — DX: Fracture of unspecified part of right clavicle, initial encounter for closed fracture: S42.001A

## 2020-03-29 HISTORY — DX: Displaced fracture of posterior wall of left acetabulum, initial encounter for closed fracture: S32.422A

## 2020-03-29 HISTORY — DX: Displaced simple supracondylar fracture without intercondylar fracture of left humerus, initial encounter for open fracture: S42.412B

## 2020-03-29 HISTORY — DX: Monteggia's fracture of left ulna, initial encounter for open fracture type IIIA, IIIB, or IIIC: S52.272C

## 2020-03-29 HISTORY — DX: Unspecified dislocation of left hip, initial encounter: S73.005A

## 2020-03-29 LAB — URINALYSIS, ROUTINE W REFLEX MICROSCOPIC
Bilirubin Urine: NEGATIVE
Glucose, UA: 150 mg/dL — AB
Ketones, ur: NEGATIVE mg/dL
Leukocytes,Ua: NEGATIVE
Nitrite: NEGATIVE
Protein, ur: 100 mg/dL — AB
RBC / HPF: >50 RBC/hpf — ABNORMAL HIGH (ref 0–5)
Specific Gravity, Urine: >1.046 — ABNORMAL HIGH (ref 1.005–1.030)
pH: 5 (ref 5.0–8.0)

## 2020-03-29 LAB — I-STAT CHEM 8, ED
BUN: 10 mg/dL (ref 6–20)
Calcium, Ion: 0.97 mmol/L — ABNORMAL LOW (ref 1.15–1.40)
Chloride: 108 mmol/L (ref 98–111)
Creatinine, Ser: 1.3 mg/dL — ABNORMAL HIGH (ref 0.61–1.24)
Glucose, Bld: 275 mg/dL — ABNORMAL HIGH (ref 70–99)
HCT: 38 % — ABNORMAL LOW (ref 39.0–52.0)
Hemoglobin: 12.9 g/dL — ABNORMAL LOW (ref 13.0–17.0)
Potassium: 2.6 mmol/L — CL (ref 3.5–5.1)
Sodium: 142 mmol/L (ref 135–145)
TCO2: 15 mmol/L — ABNORMAL LOW (ref 22–32)

## 2020-03-29 LAB — CDS SEROLOGY

## 2020-03-29 LAB — I-STAT ARTERIAL BLOOD GAS, ED
Acid-base deficit: 15 mmol/L — ABNORMAL HIGH (ref 0.0–2.0)
Bicarbonate: 15.3 mmol/L — ABNORMAL LOW (ref 20.0–28.0)
Calcium, Ion: 1.12 mmol/L — ABNORMAL LOW (ref 1.15–1.40)
HCT: 32 % — ABNORMAL LOW (ref 39.0–52.0)
Hemoglobin: 10.9 g/dL — ABNORMAL LOW (ref 13.0–17.0)
O2 Saturation: 100 %
Patient temperature: 98
Potassium: 2.5 mmol/L — CL (ref 3.5–5.1)
Sodium: 141 mmol/L (ref 135–145)
TCO2: 17 mmol/L — ABNORMAL LOW (ref 22–32)
pCO2 arterial: 57.1 mmHg — ABNORMAL HIGH (ref 32.0–48.0)
pH, Arterial: 7.033 — CL (ref 7.350–7.450)
pO2, Arterial: 270 mmHg — ABNORMAL HIGH (ref 83.0–108.0)

## 2020-03-29 LAB — POCT I-STAT 7, (LYTES, BLD GAS, ICA,H+H)
Acid-Base Excess: 0 mmol/L (ref 0.0–2.0)
Acid-base deficit: 15 mmol/L — ABNORMAL HIGH (ref 0.0–2.0)
Bicarbonate: 14.5 mmol/L — ABNORMAL LOW (ref 20.0–28.0)
Bicarbonate: 23.6 mmol/L (ref 20.0–28.0)
Calcium, Ion: 1.13 mmol/L — ABNORMAL LOW (ref 1.15–1.40)
Calcium, Ion: 0.81 mmol/L — CL (ref 1.15–1.40)
HCT: 24 % — ABNORMAL LOW (ref 39.0–52.0)
HCT: 31 % — ABNORMAL LOW (ref 39.0–52.0)
Hemoglobin: 8.2 g/dL — ABNORMAL LOW (ref 13.0–17.0)
Hemoglobin: 10.5 g/dL — ABNORMAL LOW (ref 13.0–17.0)
O2 Saturation: 100 %
O2 Saturation: 100 %
Patient temperature: 33.6
Patient temperature: 38.7
Potassium: 4 mmol/L (ref 3.5–5.1)
Potassium: 6.1 mmol/L — ABNORMAL HIGH (ref 3.5–5.1)
Sodium: 145 mmol/L (ref 135–145)
Sodium: 153 mmol/L — ABNORMAL HIGH (ref 135–145)
TCO2: 16 mmol/L — ABNORMAL LOW (ref 22–32)
TCO2: 25 mmol/L (ref 22–32)
pCO2 arterial: 42.7 mmHg (ref 32.0–48.0)
pCO2 arterial: 35.2 mmHg (ref 32.0–48.0)
pH, Arterial: 7.118 — CL (ref 7.350–7.450)
pH, Arterial: 7.441 (ref 7.350–7.450)
pO2, Arterial: 459 mmHg — ABNORMAL HIGH (ref 83.0–108.0)
pO2, Arterial: 185 mmHg — ABNORMAL HIGH (ref 83.0–108.0)

## 2020-03-29 LAB — POCT I-STAT, CHEM 8
BUN: 7 mg/dL (ref 6–20)
Calcium, Ion: 1.06 mmol/L — ABNORMAL LOW (ref 1.15–1.40)
Chloride: 111 mmol/L (ref 98–111)
Creatinine, Ser: 0.9 mg/dL (ref 0.61–1.24)
Glucose, Bld: 96 mg/dL (ref 70–99)
HCT: 20 % — ABNORMAL LOW (ref 39.0–52.0)
Hemoglobin: 6.8 g/dL — CL (ref 13.0–17.0)
Potassium: 3.9 mmol/L (ref 3.5–5.1)
Sodium: 146 mmol/L — ABNORMAL HIGH (ref 135–145)
TCO2: 20 mmol/L — ABNORMAL LOW (ref 22–32)

## 2020-03-29 LAB — GLUCOSE, CAPILLARY
Glucose-Capillary: 57 mg/dL — ABNORMAL LOW (ref 70–99)
Glucose-Capillary: 131 mg/dL — ABNORMAL HIGH (ref 70–99)
Glucose-Capillary: 88 mg/dL (ref 70–99)

## 2020-03-29 LAB — PROTIME-INR
INR: 1.5 — ABNORMAL HIGH (ref 0.8–1.2)
INR: 1.4 — ABNORMAL HIGH (ref 0.8–1.2)
Prothrombin Time: 17.8 seconds — ABNORMAL HIGH (ref 11.4–15.2)
Prothrombin Time: 16.8 seconds — ABNORMAL HIGH (ref 11.4–15.2)

## 2020-03-29 LAB — COMPREHENSIVE METABOLIC PANEL
ALT: 43 U/L (ref 0–44)
AST: 142 U/L — ABNORMAL HIGH (ref 15–41)
Albumin: 2.7 g/dL — ABNORMAL LOW (ref 3.5–5.0)
Alkaline Phosphatase: 53 U/L (ref 38–126)
Anion gap: 18 — ABNORMAL HIGH (ref 5–15)
BUN: 9 mg/dL (ref 6–20)
CO2: 12 mmol/L — ABNORMAL LOW (ref 22–32)
Calcium: 7.1 mg/dL — ABNORMAL LOW (ref 8.9–10.3)
Chloride: 111 mmol/L (ref 98–111)
Creatinine, Ser: 1.07 mg/dL (ref 0.61–1.24)
GFR, Estimated: >60 mL/min (ref >60)
Glucose, Bld: 297 mg/dL — ABNORMAL HIGH (ref 70–99)
Potassium: 2.9 mmol/L — ABNORMAL LOW (ref 3.5–5.1)
Sodium: 141 mmol/L (ref 135–145)
Total Bilirubin: 0.3 mg/dL (ref 0.3–1.2)
Total Protein: 4.5 g/dL — ABNORMAL LOW (ref 6.5–8.1)

## 2020-03-29 LAB — CBC
HCT: 39.2 % (ref 39.0–52.0)
HCT: 31 % — ABNORMAL LOW (ref 39.0–52.0)
Hemoglobin: 12.1 g/dL — ABNORMAL LOW (ref 13.0–17.0)
Hemoglobin: 11 g/dL — ABNORMAL LOW (ref 13.0–17.0)
MCH: 28.6 pg (ref 26.0–34.0)
MCH: 30 pg (ref 26.0–34.0)
MCHC: 30.9 g/dL (ref 30.0–36.0)
MCHC: 35.5 g/dL (ref 30.0–36.0)
MCV: 92.7 fL (ref 80.0–100.0)
MCV: 84.5 fL (ref 80.0–100.0)
Platelets: 210 10*3/uL (ref 150–400)
Platelets: 118 10*3/uL — ABNORMAL LOW (ref 150–400)
RBC: 4.23 MIL/uL (ref 4.22–5.81)
RBC: 3.67 MIL/uL — ABNORMAL LOW (ref 4.22–5.81)
RDW: 13.2 % (ref 11.5–15.5)
RDW: 13.8 % (ref 11.5–15.5)
WBC: 30.7 10*3/uL — ABNORMAL HIGH (ref 4.0–10.5)
WBC: 7.7 10*3/uL (ref 4.0–10.5)
nRBC: 0 % (ref 0.0–0.2)
nRBC: 0 % (ref 0.0–0.2)

## 2020-03-29 LAB — ETHANOL: Alcohol, Ethyl (B): 184 mg/dL — ABNORMAL HIGH (ref <10)

## 2020-03-29 LAB — LACTIC ACID, PLASMA: Lactic Acid, Venous: 8.4 mmol/L (ref 0.5–1.9)

## 2020-03-29 LAB — ABO/RH: ABO/RH(D): O POS

## 2020-03-29 LAB — SARS CORONAVIRUS 2 BY RT PCR (HOSPITAL ORDER, PERFORMED IN ~~LOC~~ HOSPITAL LAB): SARS Coronavirus 2: NEGATIVE

## 2020-03-29 LAB — HEMOGLOBIN A1C
Hgb A1c MFr Bld: 5 % (ref 4.8–5.6)
Mean Plasma Glucose: 96.8 mg/dL

## 2020-03-29 IMAGING — CT CT CHEST-ABD-PELV W/ CM
2 of 5 series · 12 of 36 positions shown, 14 images · IV contrast (APPLIED)
Comparison: Trauma series radiographs today.

CLINICAL DATA: Study discussed by telephone with Dr. FANNI on

EXAM:
CT CHEST, ABDOMEN, AND PELVIS WITH CONTRAST
TECHNIQUE: Multidetector CT imaging of the chest, abdomen and pelvis was
performed following the standard protocol during bolus
administration of intravenous contrast.
CONTRAST:  125mL OMNIPAQUE IOHEXOL 350 MG/ML SOLN

[Series 4: coronal · coronal · 0.82mm/px · 3 of 134 slices shown]
[im 27/134  mediastinal]
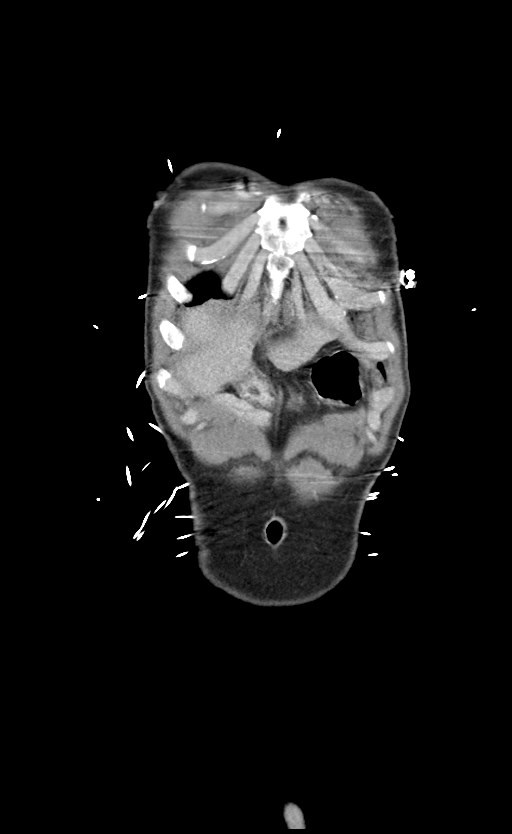
[im 54/134  mediastinal]
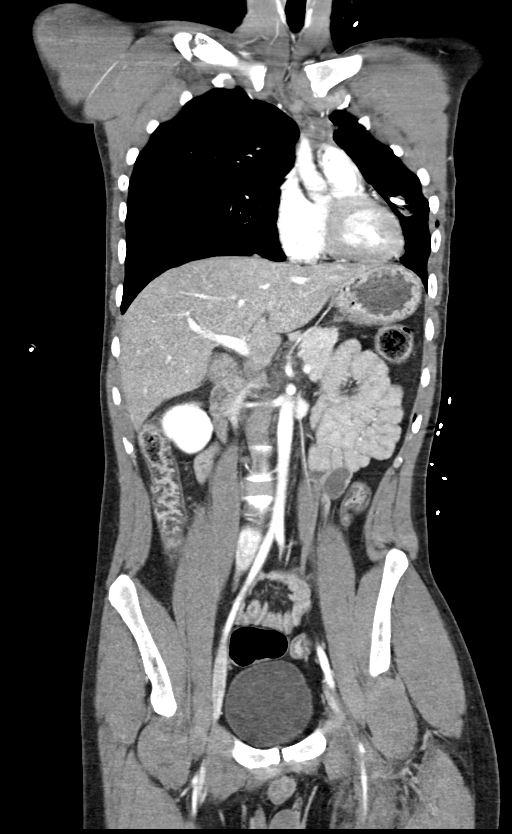
[im 80/134  mediastinal]
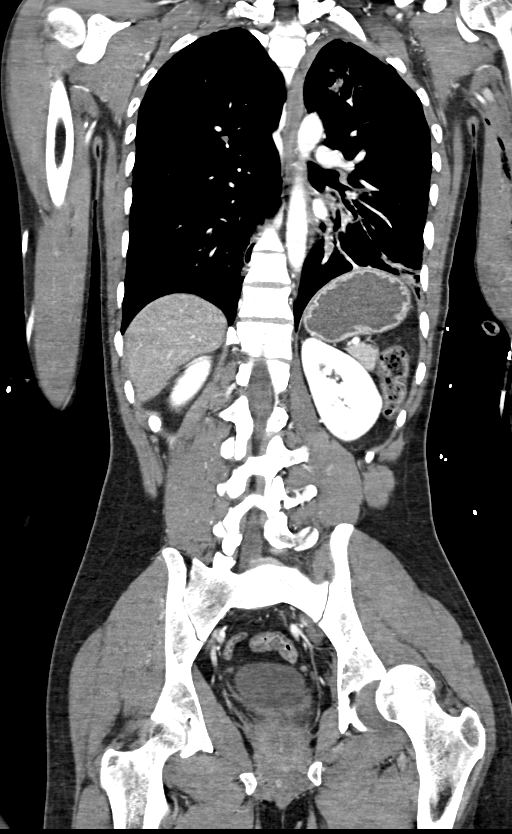

[Series 5: cap 5.0 i31f 2 · axial · 0.76mm/px · z∈[-798,-252]mm · 9 of 137 slices shown, 11 images]
[im 14/137  mediastinal]
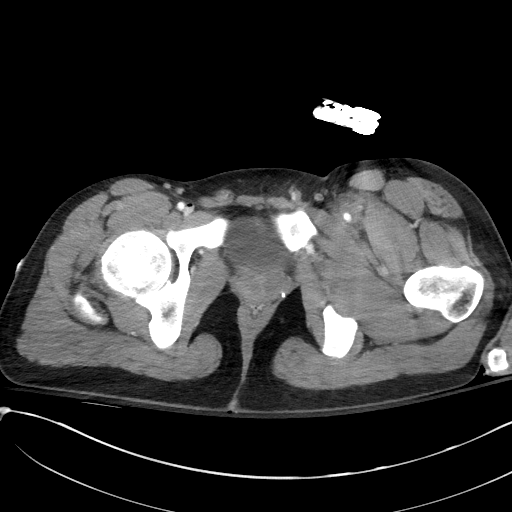
[im 14/137  bone]
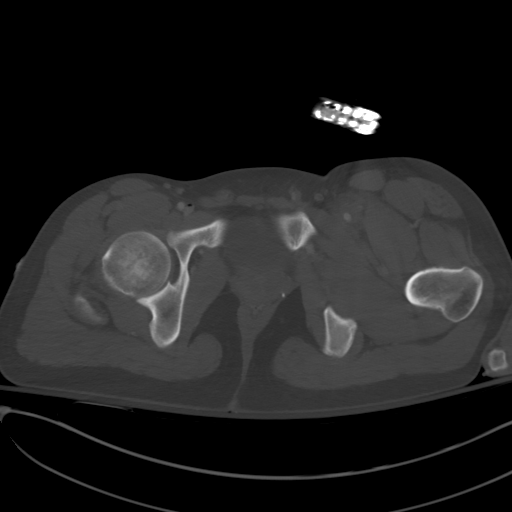
[im 28/137  mediastinal]
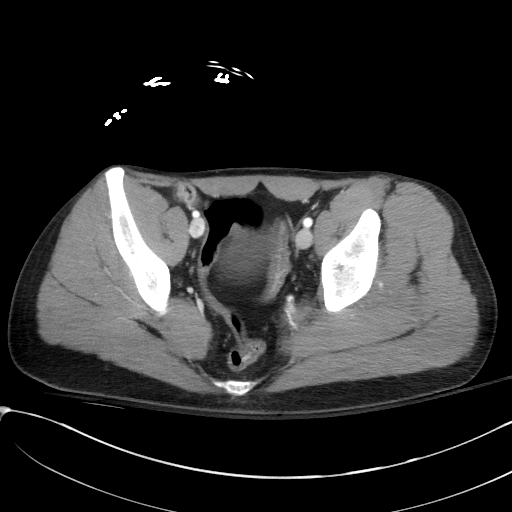
[im 41/137  mediastinal]
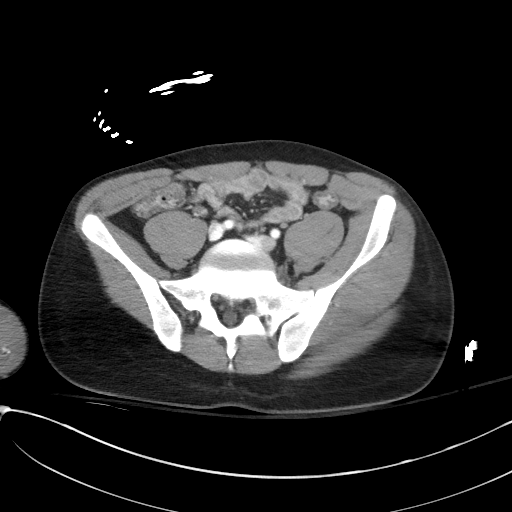
[im 55/137  mediastinal]
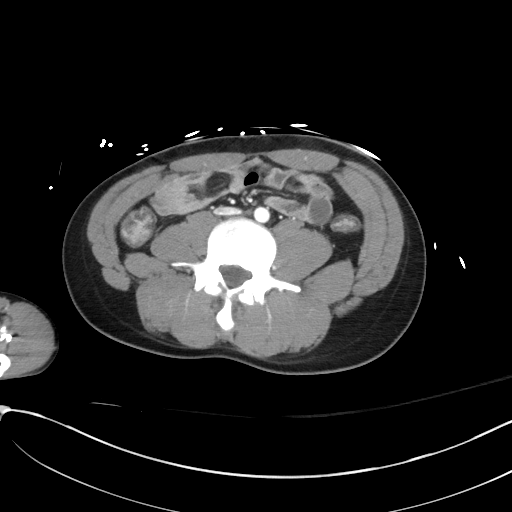
[im 69/137  mediastinal]
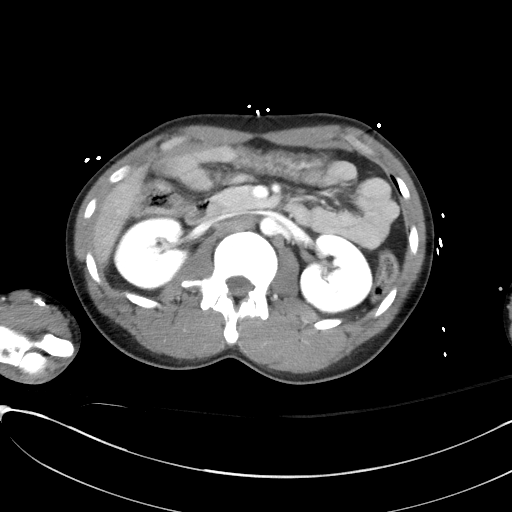
[im 82/137  mediastinal]
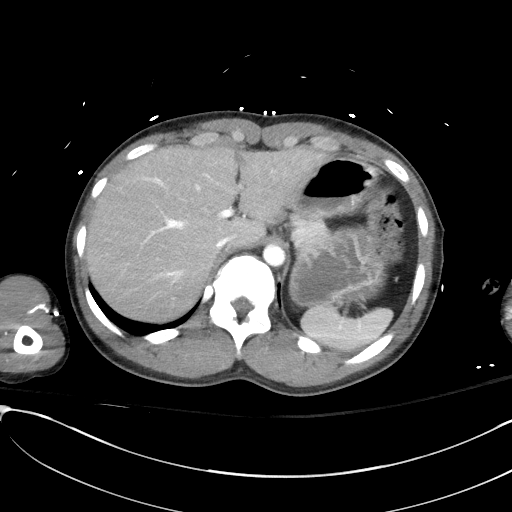
[im 96/137  mediastinal]
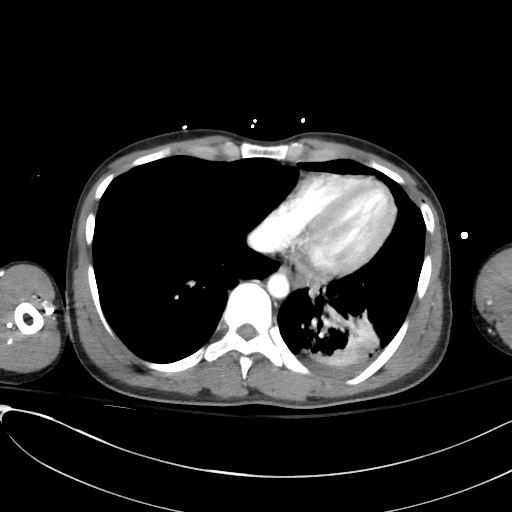
[im 109/137  mediastinal]
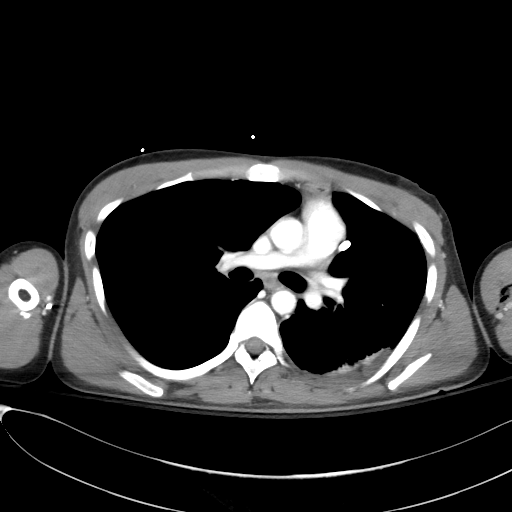
[im 123/137  mediastinal]
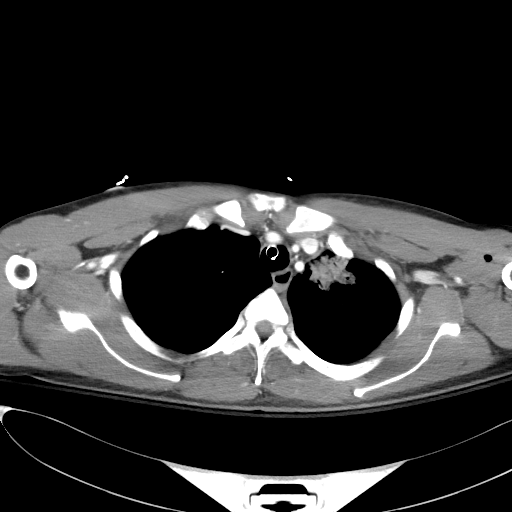
[im 123/137  bone]
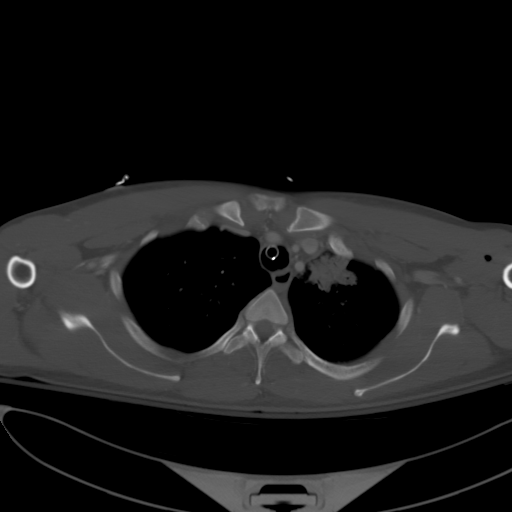

[12 of 36 positions shown; findings below may reference images not displayed]

Cervical spine CT
today reported separately. CTA left lower extremity reported
separately today.
FINDINGS: CT CHEST FINDINGS

Cardiovascular: No cardiomegaly or pericardial effusion. No thoracic
aortic injury identified. Central pulmonary arteries appear patent.

Mediastinum/Nodes: Leftward mediastinal shift. No mediastinal
hematoma identified.

Lungs/Pleura: Endotracheal tube terminates above the carina. Major
airways remain patent.

Confluent patchy contusion in the anterior left lung apex (series 6,
image 36). Additional enhancing left lower lobe consolidation. Left
lateral approach chest tube in place, terminates along the medial
left apex. Small volume residual left pneumothorax at the
cardiophrenic angle (series 6, image 90). Small volume left layering
hemothorax.

Small additional areas of scattered pulmonary contusion in the left
lung, including some along the left lingula course of the tube
(series 6, image 87).

Mildly hyperexpanded right lung with a medial right lower lobe
pulmonary laceration (series 6, image 110), small volume
posttraumatic gas and contusion at that site. But no superimposed
right pneumothorax.

Small areas of additional mild right lower lobe contusion.

Musculoskeletal: Mildly comminuted minimally displaced right mid
clavicle fracture.

Other visible shoulder osseous structures appear intact.

No thoracic vertebral fracture identified.

Motion artifact through the lower sternum. No convincing sternal
fracture.

No displaced left rib fracture. Small volume inter costal space gas
at the level of the left chest tube.

Mildly displaced greenstick type fracture of the right anterior 2nd
rib (series 6, image 53). No other right rib fracture identified.

CT ABDOMEN PELVIS FINDINGS

Hepatobiliary: Mild motion artifact. No liver or gallbladder injury
identified. No perihepatic free fluid.

Pancreas: Homogeneous enhancement, appears intact.

Spleen: Diminutive. No splenic injury identified. No perisplenic
fluid.

Adrenals/Urinary Tract: Normal adrenal glands.

Hyperenhancement of both kidneys. Symmetric renal contrast
excretion. No renal injury or obstruction. No perinephric stranding.
Proximal ureters appear within normal limits.

Diminutive urinary bladder appears intact.

Stomach/Bowel: Decompressed large and small bowel throughout the
abdomen and pelvis. No pneumoperitoneum. No free fluid identified.

Motion artifact at the hepatic flexure. No convincing hepatic
flexure injury.

Mild to moderate fluid distension of the stomach. No convincing
gastric or duodenal injury.

Vascular/Lymphatic: Abdominal aorta and major arterial structures in
the abdomen and pelvis are patent. There is irregularity of the
distal left external iliac artery which continues into the visible
proximal femoral arteries. See left lower extremity CTA reported
separately.

Portal venous system appears patent.  No lymphadenopathy.

Reproductive: Negative.

Other: No pelvic free fluid.

Musculoskeletal: Nondisplaced fracture left L5 transverse process.
Other lumbar levels appear intact.

Sacrum and SI joints intact.

Right hemipelvis, pubic symphysis, proximal right femur intact.

Posteriorly dislocated left femoral head, impacted on the posterior
left acetabulum. Superimposed small curvilinear avulsion bone
fragment is present along the roof of the left acetabulum, donor
site uncertain. No superimposed fracture of the proximal left femur
identified. No other fracture of the left acetabulum or left
hemipelvis. Pubic rami appear intact.

Asymmetric swelling of the musculature about the left hip and
proximal thigh compatible with intramuscular hematoma. Hemorrhage
within the left hip joint space.
IMPRESSION: CHEST:

1. Left chest tube in place with small residual pneumothorax, small
volume layering left hemothorax.
2. Multilobar left lung pulmonary contusions plus lower lobe
aspiration or atelectasis.
3. Medial right lower lobe pulmonary laceration. Scattered small
right lower lobe pulmonary contusions.
4. Right clavicle and anterior right 2nd rib fractures.
5. No other acute traumatic injury identified in the chest. ET tube
in good position.

ABDOMEN:

1.  No acute traumatic injury identified in the abdomen.
2. Mild to moderate fluid distension of the stomach.

PELVIS:

1. Posteriorly dislocated left femoral head, impacted on the
posterior acetabulum. Small curvilinear avulsion fragment along the
roof of the left acetabulum. Associated hemo arthrosis and regional
intramuscular hematoma.
2. Irregularity of left external iliac and proximal femoral
arteries. See Left Lower Extremity CTA reported separately.
3. Nondisplaced left L5 transverse process fracture.
4. No other acute traumatic injury identified in the pelvis.

Study discussed by telephone with Dr. FANNI on [DATE] at

## 2020-03-29 IMAGING — RF DG HIP (WITH PELVIS) OPERATIVE*L*
1 series · 4 of 4 positions shown · non-contrast
Comparison: [DATE]

CLINICAL DATA: Intraoperative images from left hip reduction.

EXAM:
OPERATIVE LEFT HIP (WITH PELVIS IF PERFORMED)
TECHNIQUE: Fluoroscopic spot image(s) were submitted for interpretation
post-operatively.

[Series 1: run · 4 of 4 slices shown]
[im 1/4]
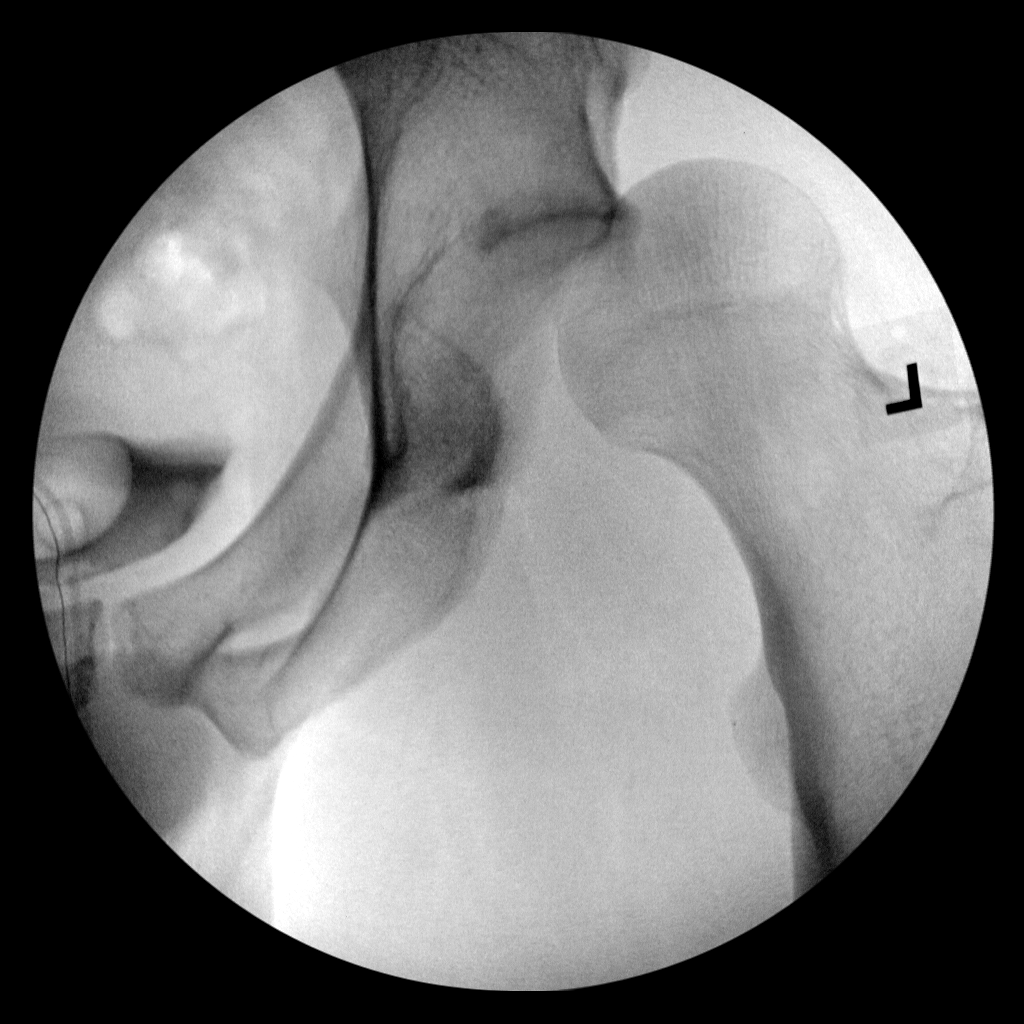
[im 2/4]
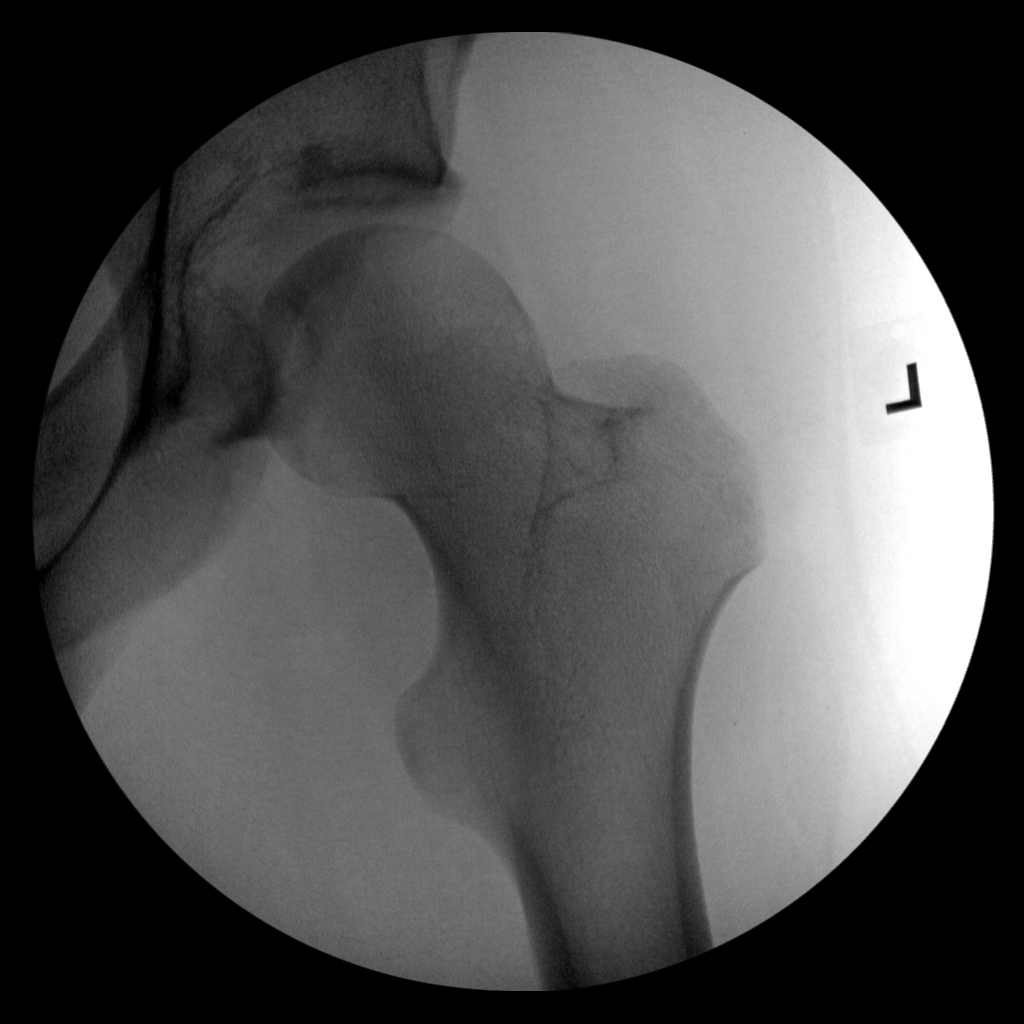
[im 3/4]
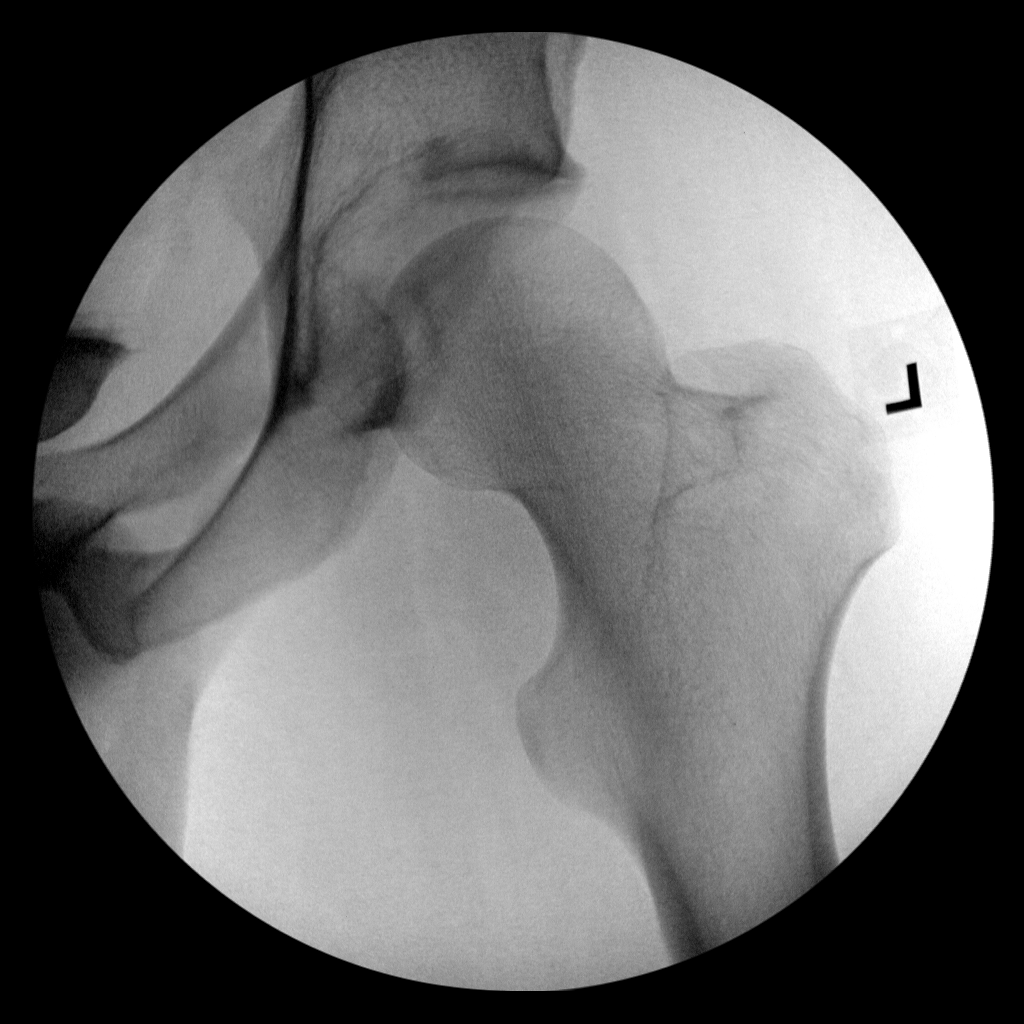
[im 4/4]
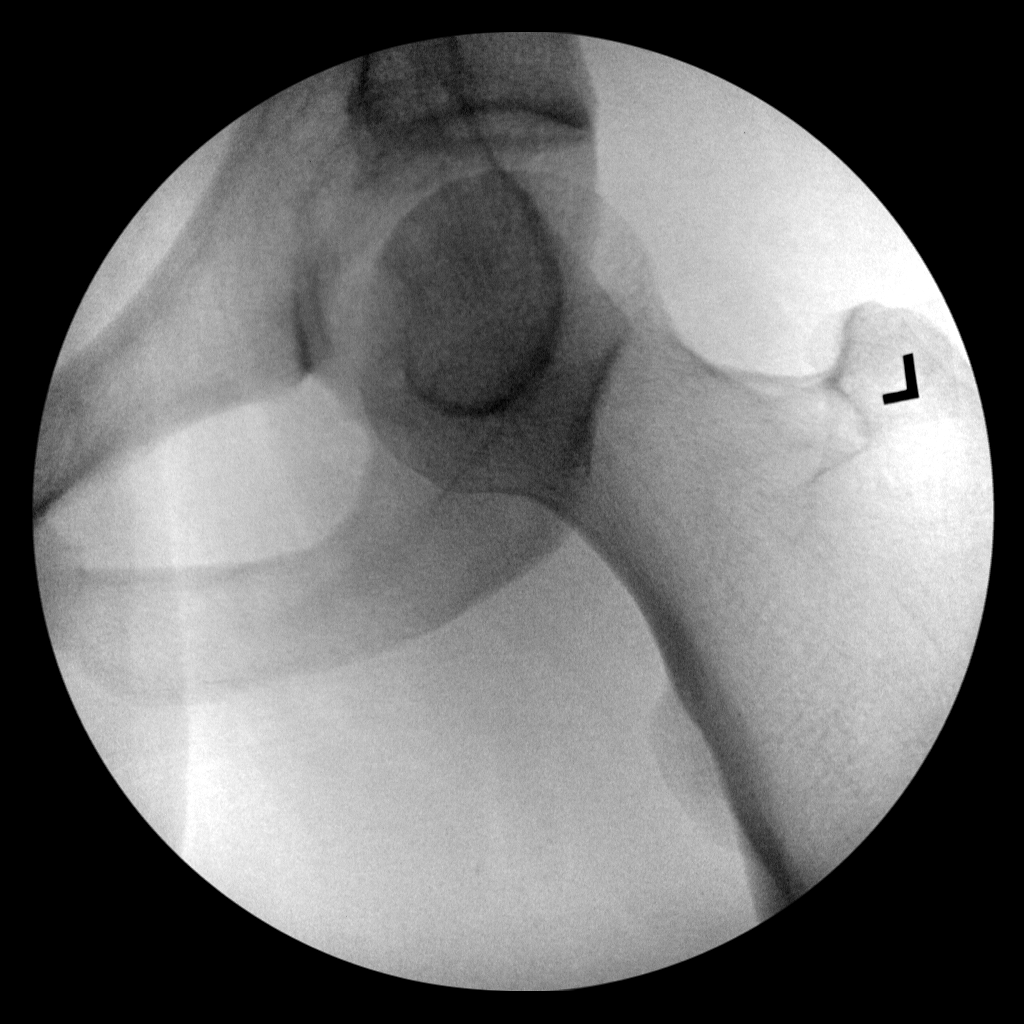

[4 of 4 positions shown; findings below may reference images not displayed]

FINDINGS: Several fluoroscopic intraoperative images demonstrate interval
reduction of previously demonstrated left hip subluxation. The
alignment is anatomic on the final image.

Fluoroscopy time 6 seconds.
IMPRESSION: Intraoperative images from left hip reduction.
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
## 2020-03-29 IMAGING — CT CT 3D ACQUISTION WKST
1 of 2 series · 1 of 30 positions shown · non-contrast
Comparison: Maxillofacial CT same day

CLINICAL DATA: Nonspecific (abnormal) findings on radiological and
other examination of musculoskeletal system. Complex skull fractures

EXAM:
3-DIMENSIONAL CT IMAGE RENDERING ON ACQUISITION WORKSTATION
TECHNIQUE: 3-dimensional CT images were rendered by post-processing of the
original CT data on an acquisition workstation. The 3-dimensional CT
images were interpreted and findings were reported in the
accompanying complete CT report for this study

[Series 603: spin · 1 of 19 slices shown]
[im 10/19]
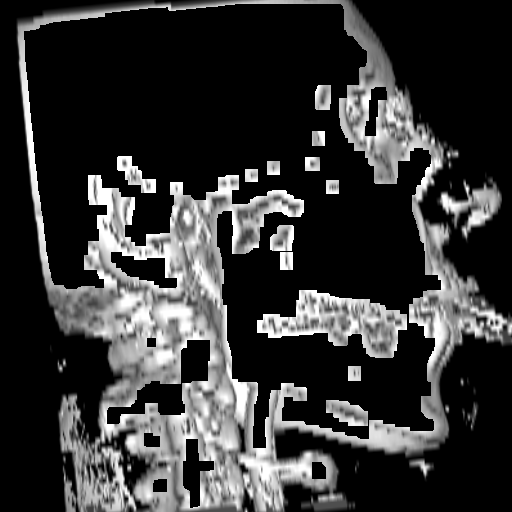

[1 of 30 positions shown; findings below may reference images not displayed]

FINDINGS: 3D images of the skull were generated, both by the technologist and
by the radiologist. 3D images further demonstrate the complex
bilateral facial fractures depicted on the earlier scan.
IMPRESSION: 3D images generated from maxillofacial CT dated [DATE].

Complex facial fractures including bilateral NOMASIBULELE fractures, as
previously described.
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
## 2020-03-29 IMAGING — CT CT HIP*L* W/O CM
2 of 3 series · 16 of 46 positions shown, 18 images · non-contrast
Comparison: None.

CLINICAL DATA: Posterior hip dislocation

EXAM:
CT OF THE LEFT HIP WITHOUT CONTRAST
TECHNIQUE: Multidetector CT imaging of the left hip was performed according to
the standard protocol. Multiplanar CT image reconstructions were
also generated.

[Series 5: soft tissue · axial · 0.47mm/px · z∈[+1018,+1280]mm · 13 of 151 slices shown, 15 images]
[im 10/151  soft-tissue]
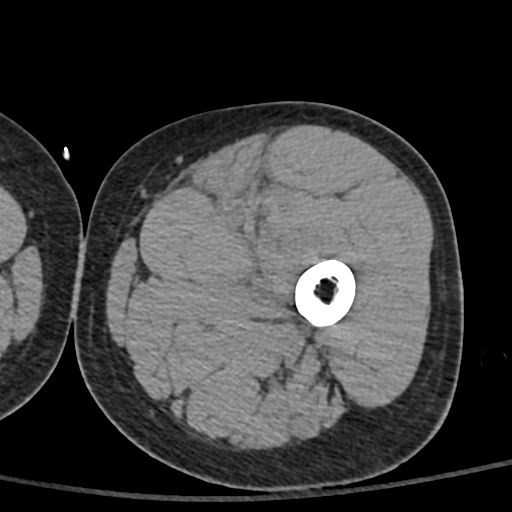
[im 10/151  bone]
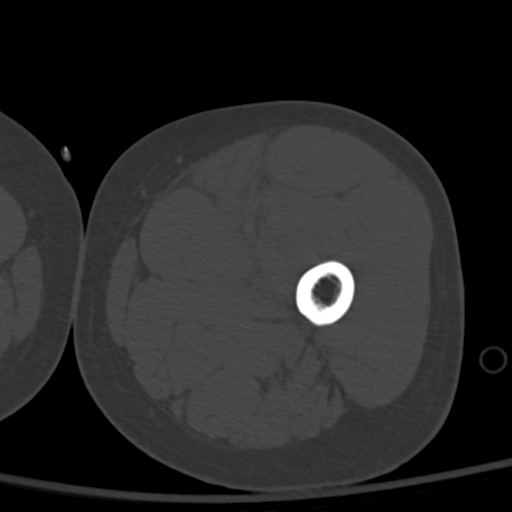
[im 20/151  soft-tissue]
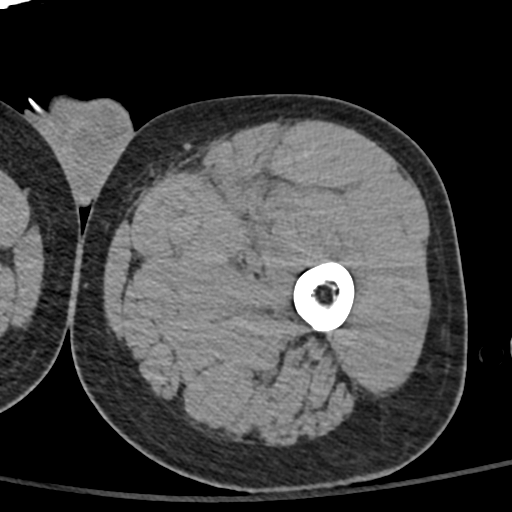
[im 30/151  soft-tissue]
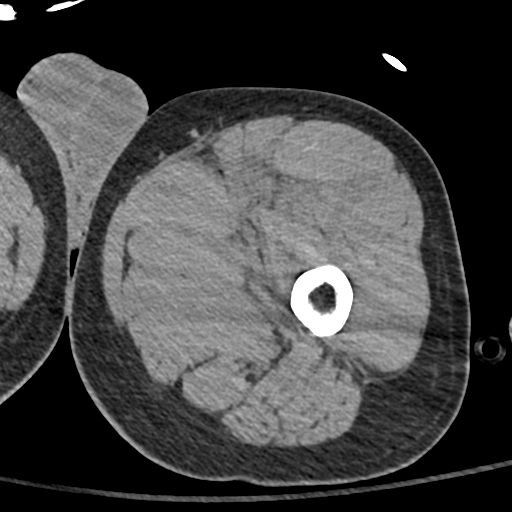
[im 44/151  soft-tissue]
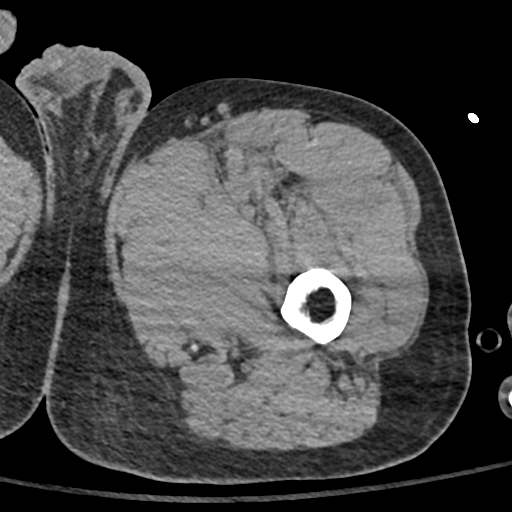
[im 54/151  soft-tissue]
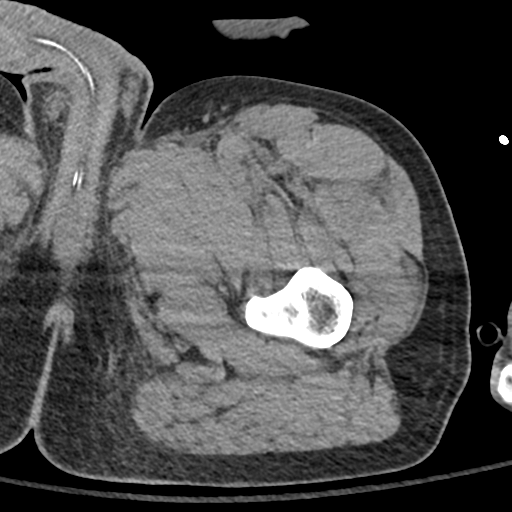
[im 63/151  soft-tissue]
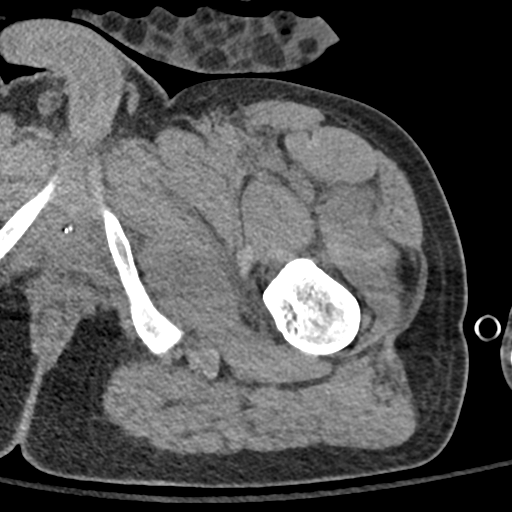
[im 78/151  soft-tissue]
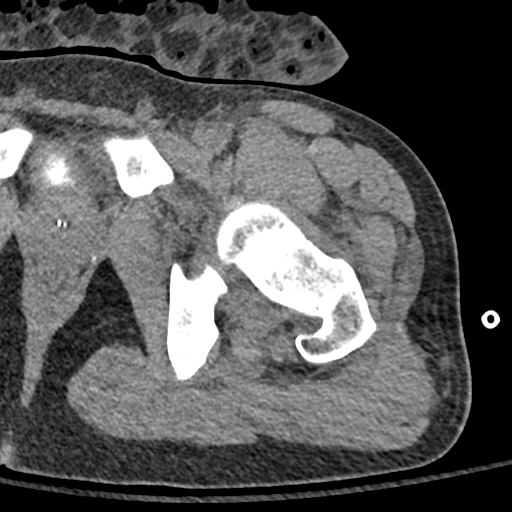
[im 88/151  soft-tissue]
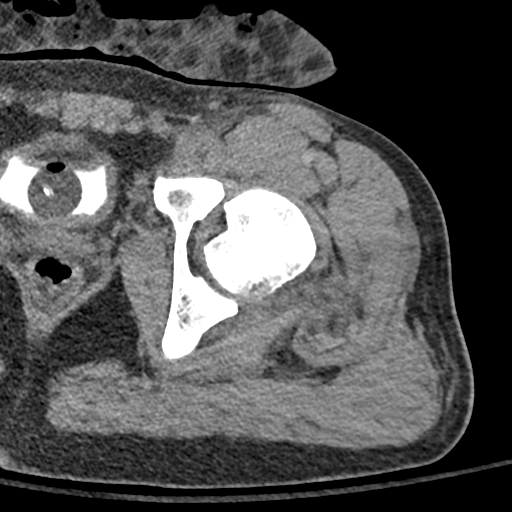
[im 97/151  soft-tissue]
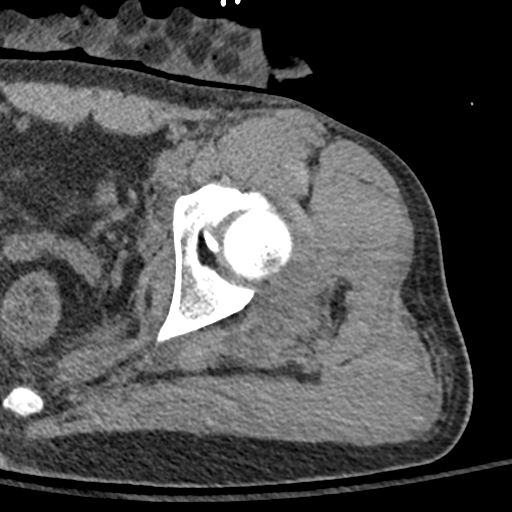
[im 97/151  bone]
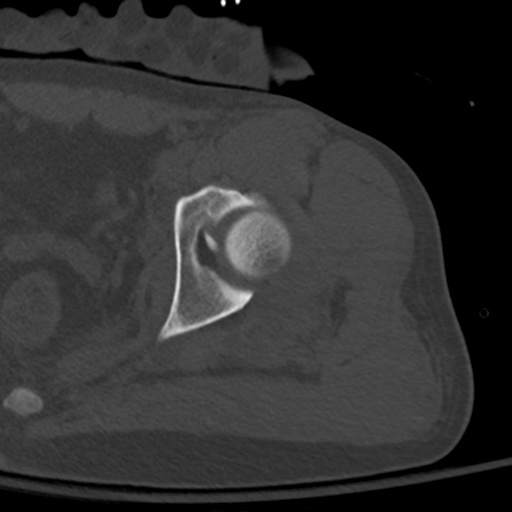
[im 107/151  soft-tissue]
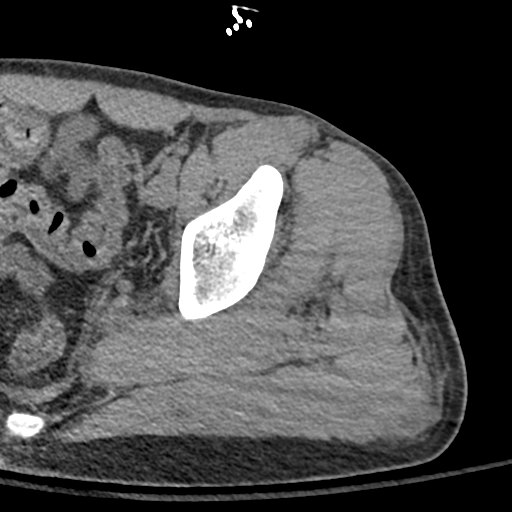
[im 121/151  soft-tissue]
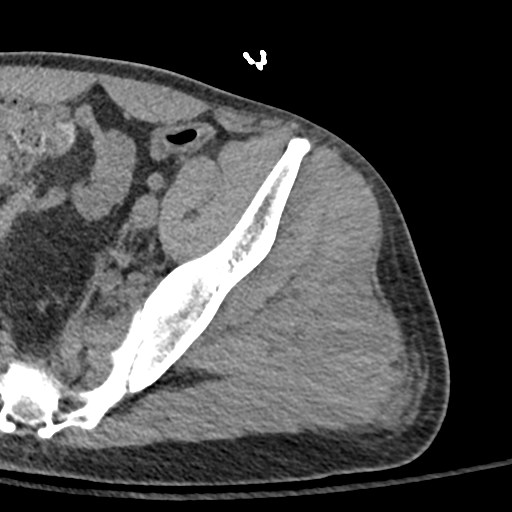
[im 131/151  soft-tissue]
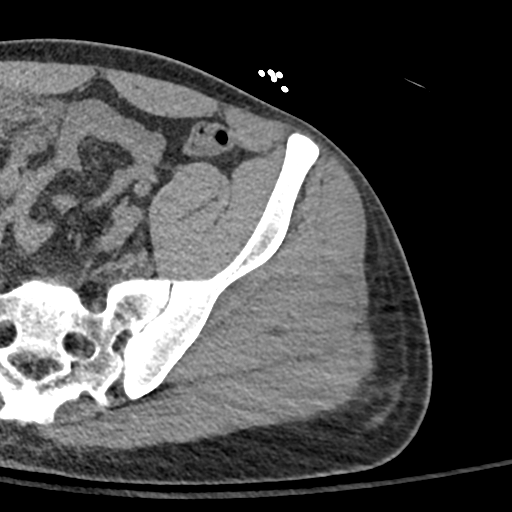
[im 141/151  soft-tissue]
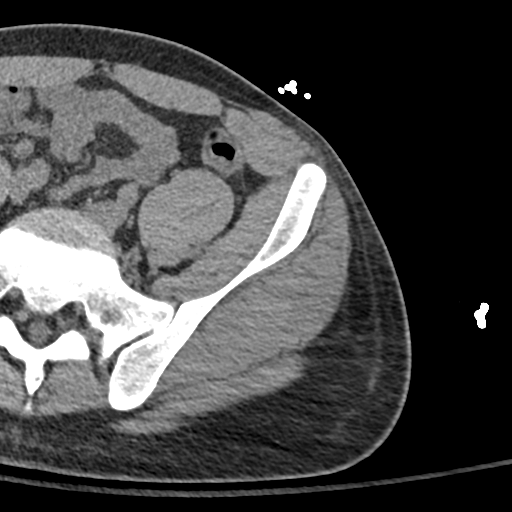

[Series 6: cor soft · coronal · 0.48mm/px · 3 of 120 slices shown]
[im 40/120  soft-tissue]
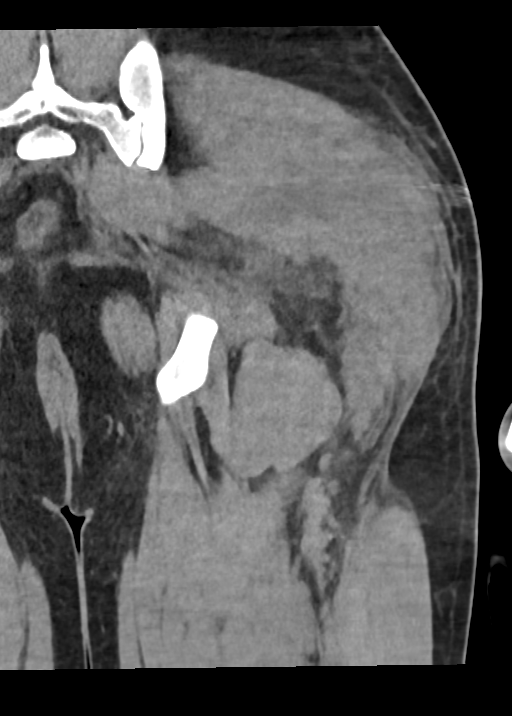
[im 53/120  soft-tissue]
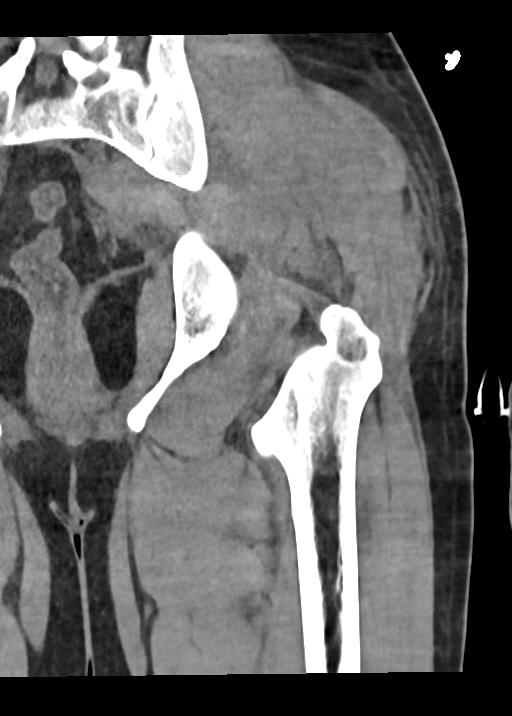
[im 67/120  soft-tissue]
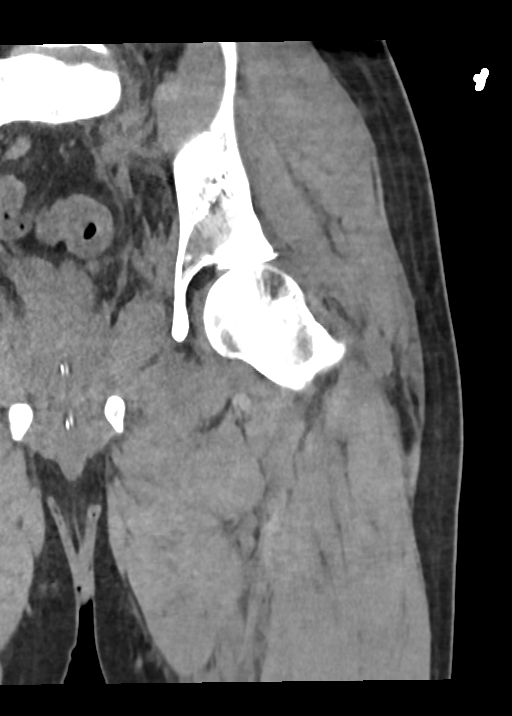

[16 of 46 positions shown; findings below may reference images not displayed]

FINDINGS: Bones/Joint/Cartilage

There is been interval reduction the left hip dislocation. Again
noted is calcification of long the superior posterior acetabular rim
likely calcification of the labrum. Tiny cortical irregularity seen
along the posteroinferior acetabulum which could represent a tiny
cortical fracture. No other fracture is identified. The sacroiliac
joint is intact.

Ligaments

Suboptimally assessed by CT.

Muscles and Tendons

There appears to be edema with probable small hematoma seen within
the piriformis and internal obturator musculature. The remainder of
the muscles appear to be intact. The visualized portions of the
tendons are intact.

Soft tissues

There is soft tissue swelling and contusion seen along the
anterolateral aspect of the hip.
IMPRESSION: Interval reduction of the posterior femoral head dislocation. Slight
cortical irregularity along the posterior acetabular rim which could
represent a tiny impaction fracture.

Calcification of the superior labrum.

Probable small intramuscular hematoma and edema within the
piriformis and internal obturator.
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
## 2020-03-29 IMAGING — DX DG ELBOW 2V*L*
2 series · 2 of 2 positions shown · non-contrast
Comparison: X-ray earlier in the same day.

CLINICAL DATA: External fixation

EXAM:
LEFT ELBOW - 2 VIEW

[elbow ap]
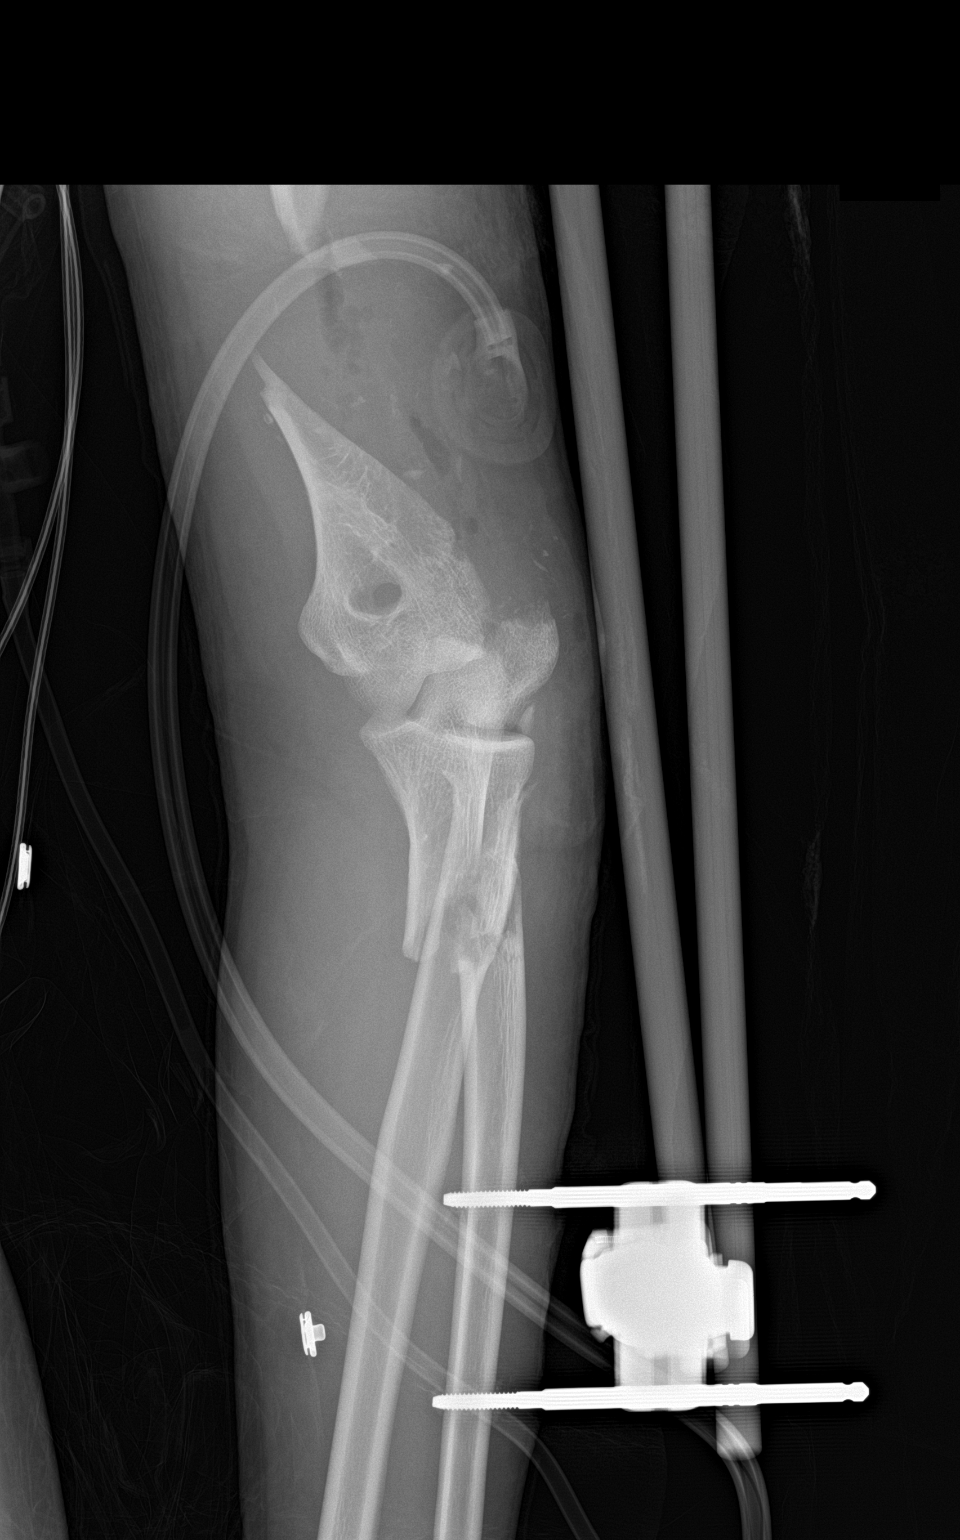

[elbow lat]
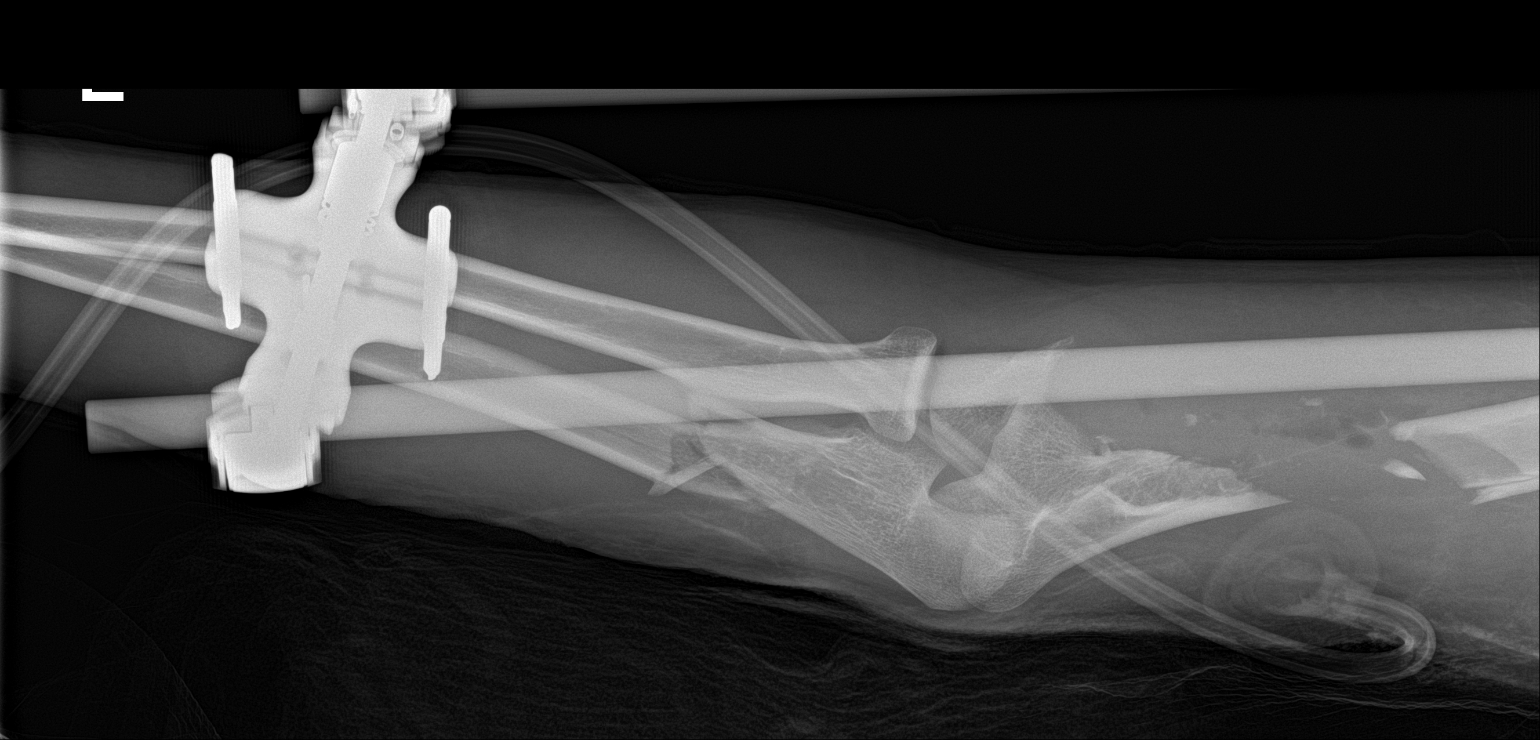

[2 of 2 positions shown; findings below may reference images not displayed]

FINDINGS: The patient has undergone external fixation. Again noted are
significantly displaced and comminuted fractures of the distal
humerus. There is an acute displaced comminuted fracture of the
proximal ulna. There is extensive surrounding soft tissue swelling.
There are pockets of subcutaneous gas consistent with an open
fracture.
IMPRESSION: Status post external fixation of the left elbow with persistently
displaced and comminuted fractures of the distal humerus and
proximal ulna.
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
## 2020-03-29 SURGERY — CLOSED REDUCTION, HIP
Anesthesia: General | Site: Knee

## 2020-03-29 MED ORDER — SODIUM BICARBONATE 8.4 % IV SOLN
INTRAVENOUS | Status: DC | PRN
  Administered 2020-03-29 (×2): 50 meq via INTRAVENOUS

## 2020-03-29 MED ORDER — ONDANSETRON HCL 4 MG/2ML IJ SOLN
4.0000 mg | Freq: Four times a day (QID) | INTRAMUSCULAR | Status: DC | PRN
  Administered 2020-04-09 – 2020-04-21 (×2): 4 mg via INTRAVENOUS
  Filled 2020-03-29 (×3): qty 2

## 2020-03-29 MED ORDER — PANTOPRAZOLE SODIUM 40 MG PO PACK
40.0000 mg | PACK | Freq: Every day | ORAL | Status: DC
  Administered 2020-03-30 – 2020-04-10 (×10): 40 mg
  Filled 2020-03-29 (×11): qty 20

## 2020-03-29 MED ORDER — THIAMINE HCL 100 MG PO TABS: 100.0000 mg | ORAL_TABLET | Freq: Every day | ORAL | Status: DC

## 2020-03-29 MED ORDER — SODIUM CHLORIDE 0.9 % IV SOLN
2.0000 g | INTRAVENOUS | Status: DC
  Filled 2020-03-29: qty 20

## 2020-03-29 MED ORDER — 0.9 % SODIUM CHLORIDE (POUR BTL) OPTIME
TOPICAL | Status: DC | PRN
  Administered 2020-03-29 (×2): 1000 mL

## 2020-03-29 MED ORDER — LEVETIRACETAM IN NACL 500 MG/100ML IV SOLN
500.0000 mg | Freq: Two times a day (BID) | INTRAVENOUS | Status: AC
  Administered 2020-03-29 – 2020-04-04 (×13): 500 mg via INTRAVENOUS
  Filled 2020-03-29 (×13): qty 100

## 2020-03-29 MED ORDER — MIDAZOLAM HCL 2 MG/2ML IJ SOLN
2.0000 mg | INTRAMUSCULAR | Status: DC | PRN
  Administered 2020-04-02 – 2020-04-07 (×14): 2 mg via INTRAVENOUS
  Filled 2020-03-29 (×17): qty 2

## 2020-03-29 MED ORDER — SUCCINYLCHOLINE CHLORIDE 200 MG/10ML IV SOSY
PREFILLED_SYRINGE | INTRAVENOUS | Status: AC
  Filled 2020-03-29: qty 10

## 2020-03-29 MED ORDER — ADULT MULTIVITAMIN W/MINERALS CH
1.0000 | ORAL_TABLET | Freq: Every day | ORAL | Status: DC
  Administered 2020-03-30 – 2020-04-27 (×27): 1
  Filled 2020-03-29 (×27): qty 1

## 2020-03-29 MED ORDER — CEFAZOLIN SODIUM-DEXTROSE 2-4 GM/100ML-% IV SOLN
2.0000 g | Freq: Once | INTRAVENOUS | Status: AC
  Administered 2020-03-29: 2 g via INTRAVENOUS
  Filled 2020-03-29: qty 100

## 2020-03-29 MED ORDER — FENTANYL CITRATE (PF) 250 MCG/5ML IJ SOLN: INTRAMUSCULAR | Status: DC | PRN

## 2020-03-29 MED ORDER — METOPROLOL TARTRATE 5 MG/5ML IV SOLN
5.0000 mg | Freq: Four times a day (QID) | INTRAVENOUS | Status: DC | PRN
  Administered 2020-04-08 – 2020-04-12 (×2): 5 mg via INTRAVENOUS
  Filled 2020-03-29 (×2): qty 5

## 2020-03-29 MED ORDER — POTASSIUM CHLORIDE 10 MEQ/100ML IV SOLN
INTRAVENOUS | Status: DC | PRN
  Administered 2020-03-29: 10 meq via INTRAVENOUS

## 2020-03-29 MED ORDER — DEXTROSE 50 % IV SOLN
INTRAVENOUS | Status: AC
  Administered 2020-03-29: 12.5 g via INTRAVENOUS
  Filled 2020-03-29: qty 50

## 2020-03-29 MED ORDER — VANCOMYCIN HCL 1000 MG IV SOLR
INTRAVENOUS | Status: AC
  Filled 2020-03-29: qty 1000

## 2020-03-29 MED ORDER — CHLORHEXIDINE GLUCONATE CLOTH 2 % EX PADS
6.0000 | MEDICATED_PAD | Freq: Every day | CUTANEOUS | Status: DC
  Administered 2020-03-31 – 2020-04-27 (×28): 6 via TOPICAL

## 2020-03-29 MED ORDER — ROCURONIUM BROMIDE 10 MG/ML (PF) SYRINGE
PREFILLED_SYRINGE | INTRAVENOUS | Status: DC | PRN
  Administered 2020-03-29: 100 mg via INTRAVENOUS
  Administered 2020-03-29: 10 mg via INTRAVENOUS

## 2020-03-29 MED ORDER — FENTANYL CITRATE (PF) 250 MCG/5ML IJ SOLN
INTRAMUSCULAR | Status: AC
  Filled 2020-03-29: qty 5

## 2020-03-29 MED ORDER — IOHEXOL 350 MG/ML SOLN
125.0000 mL | Freq: Once | INTRAVENOUS | Status: AC | PRN
  Administered 2020-03-29: 125 mL via INTRAVENOUS

## 2020-03-29 MED ORDER — SODIUM CHLORIDE 0.9 % IR SOLN
Status: DC | PRN
  Administered 2020-03-29: 3000 mL

## 2020-03-29 MED ORDER — POTASSIUM CHLORIDE 10 MEQ/100ML IV SOLN
10.0000 meq | INTRAVENOUS | Status: AC
  Administered 2020-03-29: 10 meq via INTRAVENOUS
  Filled 2020-03-29 (×4): qty 100

## 2020-03-29 MED ORDER — SODIUM CHLORIDE 0.9 % IV SOLN: INTRAVENOUS | Status: DC

## 2020-03-29 MED ORDER — FENTANYL CITRATE (PF) 100 MCG/2ML IJ SOLN
100.0000 ug | INTRAMUSCULAR | Status: DC | PRN
  Administered 2020-03-29 – 2020-03-30 (×5): 100 ug via INTRAVENOUS
  Filled 2020-03-29 (×5): qty 2

## 2020-03-29 MED ORDER — ORAL CARE MOUTH RINSE
15.0000 mL | OROMUCOSAL | Status: DC
  Administered 2020-03-29 – 2020-04-15 (×150): 15 mL via OROMUCOSAL

## 2020-03-29 MED ORDER — DEXTROSE 50 % IV SOLN: 12.5000 g | INTRAVENOUS | Status: AC

## 2020-03-29 MED ORDER — DEXTROSE 5 % IV SOLN
INTRAVENOUS | Status: DC | PRN
  Administered 2020-03-29: 2 g via INTRAVENOUS

## 2020-03-29 MED ORDER — INSULIN ASPART 100 UNIT/ML ~~LOC~~ SOLN
0.0000 [IU] | SUBCUTANEOUS | Status: DC
  Administered 2020-03-30: 2 [IU] via SUBCUTANEOUS
  Administered 2020-03-31 (×2): 3 [IU] via SUBCUTANEOUS
  Administered 2020-04-02 (×3): 2 [IU] via SUBCUTANEOUS
  Administered 2020-04-02: 3 [IU] via SUBCUTANEOUS
  Administered 2020-04-03 – 2020-04-04 (×5): 2 [IU] via SUBCUTANEOUS
  Administered 2020-04-05: 3 [IU] via SUBCUTANEOUS
  Administered 2020-04-05 (×3): 2 [IU] via SUBCUTANEOUS
  Administered 2020-04-05: 3 [IU] via SUBCUTANEOUS
  Administered 2020-04-05 – 2020-04-07 (×4): 2 [IU] via SUBCUTANEOUS
  Administered 2020-04-08: 3 [IU] via SUBCUTANEOUS
  Administered 2020-04-08 – 2020-04-09 (×6): 2 [IU] via SUBCUTANEOUS

## 2020-03-29 MED ORDER — PROPOFOL 500 MG/50ML IV EMUL
INTRAVENOUS | Status: DC | PRN
  Administered 2020-03-29: 20 ug/kg/min via INTRAVENOUS

## 2020-03-29 MED ORDER — CHLORHEXIDINE GLUCONATE CLOTH 2 % EX PADS
6.0000 | MEDICATED_PAD | Freq: Once | CUTANEOUS | Status: AC
  Administered 2020-03-30: 6 via TOPICAL

## 2020-03-29 MED ORDER — THIAMINE HCL 100 MG PO TABS
100.0000 mg | ORAL_TABLET | Freq: Every day | ORAL | Status: DC
  Administered 2020-03-30 – 2020-04-27 (×24): 100 mg
  Filled 2020-03-29 (×26): qty 1

## 2020-03-29 MED ORDER — TETANUS-DIPHTH-ACELL PERTUSSIS 5-2.5-18.5 LF-MCG/0.5 IM SUSY: 0.5000 mL | PREFILLED_SYRINGE | Freq: Once | INTRAMUSCULAR | Status: DC

## 2020-03-29 MED ORDER — TETANUS-DIPHTH-ACELL PERTUSSIS 5-2.5-18.5 LF-MCG/0.5 IM SUSY
0.5000 mL | PREFILLED_SYRINGE | Freq: Once | INTRAMUSCULAR | Status: AC
  Administered 2020-03-29: 0.5 mL via INTRAMUSCULAR
  Filled 2020-03-29: qty 0.5

## 2020-03-29 MED ORDER — PROPOFOL 10 MG/ML IV BOLUS
INTRAVENOUS | Status: AC
  Filled 2020-03-29: qty 20

## 2020-03-29 MED ORDER — PROPOFOL 1000 MG/100ML IV EMUL
INTRAVENOUS | Status: AC
  Administered 2020-03-29: 10 ug/kg/min via INTRAVENOUS
  Filled 2020-03-29: qty 100

## 2020-03-29 MED ORDER — CHLORHEXIDINE GLUCONATE 0.12% ORAL RINSE (MEDLINE KIT)
15.0000 mL | Freq: Two times a day (BID) | OROMUCOSAL | Status: DC
  Administered 2020-03-29 – 2020-04-26 (×53): 15 mL via OROMUCOSAL

## 2020-03-29 MED ORDER — FOLIC ACID 1 MG PO TABS
1.0000 mg | ORAL_TABLET | Freq: Every day | ORAL | Status: DC
  Administered 2020-03-30 – 2020-04-27 (×27): 1 mg
  Filled 2020-03-29 (×28): qty 1

## 2020-03-29 MED ORDER — LORAZEPAM 2 MG/ML IJ SOLN
1.0000 mg | INTRAMUSCULAR | Status: AC | PRN
Start: 2020-03-29 — End: 2020-04-01
  Administered 2020-03-30: 1 mg via INTRAVENOUS
  Filled 2020-03-29: qty 1

## 2020-03-29 MED ORDER — LORAZEPAM 1 MG PO TABS: 1.0000 mg | ORAL_TABLET | ORAL | Status: AC | PRN

## 2020-03-29 MED ORDER — SODIUM CHLORIDE 0.9 % IV SOLN
2.0000 g | INTRAVENOUS | Status: AC
  Administered 2020-03-29 – 2020-03-31 (×3): 2 g via INTRAVENOUS
  Filled 2020-03-29 (×2): qty 20
  Filled 2020-03-29: qty 0.1

## 2020-03-29 MED ORDER — CEFAZOLIN SODIUM-DEXTROSE 2-4 GM/100ML-% IV SOLN: 2.0000 g | INTRAVENOUS | Status: DC

## 2020-03-29 MED ORDER — CHLORHEXIDINE GLUCONATE CLOTH 2 % EX PADS
6.0000 | MEDICATED_PAD | Freq: Once | CUTANEOUS | Status: AC
  Administered 2020-03-29: 6 via TOPICAL

## 2020-03-29 MED ORDER — PROPOFOL 1000 MG/100ML IV EMUL
5.0000 ug/kg/min | INTRAVENOUS | Status: DC
  Administered 2020-03-29: 80 ug/kg/min via INTRAVENOUS
  Administered 2020-03-29: 10 ug/kg/min via INTRAVENOUS
  Administered 2020-03-29 – 2020-03-30 (×2): 50 ug/kg/min via INTRAVENOUS
  Administered 2020-03-30 (×2): 70 ug/kg/min via INTRAVENOUS
  Administered 2020-03-30: 30 ug/kg/min via INTRAVENOUS
  Administered 2020-03-30: 35 ug/kg/min via INTRAVENOUS
  Administered 2020-03-31: 25 ug/kg/min via INTRAVENOUS
  Administered 2020-03-31: 30 ug/kg/min via INTRAVENOUS
  Administered 2020-04-03: 10 ug/kg/min via INTRAVENOUS
  Administered 2020-04-04 (×2): 60 ug/kg/min via INTRAVENOUS
  Administered 2020-04-04: 30 ug/kg/min via INTRAVENOUS
  Administered 2020-04-04 – 2020-04-05 (×3): 60 ug/kg/min via INTRAVENOUS
  Administered 2020-04-05 (×2): 80 ug/kg/min via INTRAVENOUS
  Administered 2020-04-05 (×2): 60 ug/kg/min via INTRAVENOUS
  Administered 2020-04-06: 80 ug/kg/min via INTRAVENOUS
  Administered 2020-04-06: 40.85 ug/kg/min via INTRAVENOUS
  Administered 2020-04-06 – 2020-04-07 (×8): 80 ug/kg/min via INTRAVENOUS
  Administered 2020-04-07: 20 ug/kg/min via INTRAVENOUS
  Administered 2020-04-07 – 2020-04-08 (×2): 40 ug/kg/min via INTRAVENOUS
  Filled 2020-03-29: qty 100
  Filled 2020-03-29: qty 200
  Filled 2020-03-29 (×16): qty 100
  Filled 2020-03-29: qty 200
  Filled 2020-03-29 (×4): qty 100
  Filled 2020-03-29: qty 200
  Filled 2020-03-29 (×2): qty 100
  Filled 2020-03-29 (×2): qty 200
  Filled 2020-03-29 (×2): qty 100
  Filled 2020-03-29: qty 200
  Filled 2020-03-29: qty 100
  Filled 2020-03-29: qty 200

## 2020-03-29 MED ORDER — LACTATED RINGERS IV SOLN: INTRAVENOUS | Status: DC | PRN

## 2020-03-29 MED ORDER — ROCURONIUM BROMIDE 10 MG/ML (PF) SYRINGE
PREFILLED_SYRINGE | INTRAVENOUS | Status: AC
  Filled 2020-03-29: qty 10

## 2020-03-29 MED ORDER — MIDAZOLAM HCL 2 MG/2ML IJ SOLN
2.0000 mg | INTRAMUSCULAR | Status: AC | PRN
  Administered 2020-03-29 – 2020-04-03 (×3): 2 mg via INTRAVENOUS
  Filled 2020-03-29 (×3): qty 2

## 2020-03-29 MED ORDER — PROPOFOL 1000 MG/100ML IV EMUL: 5.0000 ug/kg/min | INTRAVENOUS | Status: DC

## 2020-03-29 MED ORDER — ALBUMIN HUMAN 5 % IV SOLN: INTRAVENOUS | Status: DC | PRN

## 2020-03-29 MED ORDER — ONDANSETRON 4 MG PO TBDP: 4.0000 mg | ORAL_TABLET | Freq: Four times a day (QID) | ORAL | Status: DC | PRN

## 2020-03-29 MED ORDER — FENTANYL CITRATE (PF) 100 MCG/2ML IJ SOLN
100.0000 ug | INTRAMUSCULAR | Status: AC | PRN
  Administered 2020-03-29: 100 ug via INTRAVENOUS
  Administered 2020-03-29: 250 ug via INTRAVENOUS
  Administered 2020-03-29: 100 ug via INTRAVENOUS
  Filled 2020-03-29 (×2): qty 2

## 2020-03-29 MED ORDER — ETOMIDATE 2 MG/ML IV SOLN
INTRAVENOUS | Status: AC
  Filled 2020-03-29: qty 20

## 2020-03-29 MED ORDER — DEXTROSE-NACL 5-0.9 % IV SOLN: INTRAVENOUS | Status: DC

## 2020-03-29 MED ORDER — SODIUM CHLORIDE 0.9 % IV BOLUS
1000.0000 mL | Freq: Once | INTRAVENOUS | Status: AC
  Administered 2020-03-29: 1000 mL via INTRAVENOUS

## 2020-03-29 MED ORDER — THIAMINE HCL 100 MG/ML IJ SOLN
100.0000 mg | Freq: Every day | INTRAMUSCULAR | Status: DC
  Administered 2020-04-04 – 2020-04-16 (×3): 100 mg via INTRAVENOUS
  Filled 2020-03-29 (×6): qty 2

## 2020-03-29 MED ORDER — MIDAZOLAM HCL 2 MG/2ML IJ SOLN
INTRAMUSCULAR | Status: AC
  Filled 2020-03-29: qty 2

## 2020-03-29 MED ORDER — VANCOMYCIN HCL 1000 MG IV SOLR
INTRAVENOUS | Status: DC | PRN
  Administered 2020-03-29: 1000 mg

## 2020-03-29 SURGICAL SUPPLY — 103 items
ADH SKN CLS APL DERMABOND .7 (GAUZE/BANDAGES/DRESSINGS) ×4
APL PRP STRL LF DISP 70% ISPRP (MISCELLANEOUS)
BAR EXFX 350X11 NS LF (EXFIX) ×4
BAR EXFX 400X11 NS LF (EXFIX) ×4
BAR GLASS FIBER EXFX 11X350 (EXFIX) ×1 IMPLANT
BAR GLASS FIBER EXFX 11X400 (EXFIX) ×1 IMPLANT
BIT DRILL 3.2 XTRAFIX BLUE (BIT) ×1 IMPLANT
BIT DRILL CANN MED FLUTE 4.0 (BIT) IMPLANT
BNDG CMPR MED 15X6 ELC VLCR LF (GAUZE/BANDAGES/DRESSINGS) ×4
BNDG COHESIVE 4X5 TAN STRL (GAUZE/BANDAGES/DRESSINGS) ×5 IMPLANT
BNDG CONFORM 2 STRL LF (GAUZE/BANDAGES/DRESSINGS) IMPLANT
BNDG ELASTIC 4X5.8 VLCR STR LF (GAUZE/BANDAGES/DRESSINGS) ×5 IMPLANT
BNDG ELASTIC 6X15 VLCR STRL LF (GAUZE/BANDAGES/DRESSINGS) ×1 IMPLANT
BNDG ELASTIC 6X5.8 VLCR STR LF (GAUZE/BANDAGES/DRESSINGS) ×4 IMPLANT
BNDG GAUZE ELAST 4 BULKY (GAUZE/BANDAGES/DRESSINGS) ×9 IMPLANT
BRUSH SCRUB EZ PLAIN DRY (MISCELLANEOUS) ×10 IMPLANT
CANISTER SUCT 3000ML PPV (MISCELLANEOUS) IMPLANT
CANISTER WOUND CARE 500ML ATS (WOUND CARE) ×1 IMPLANT
CATH ROBINSON RED A/P 16FR (CATHETERS) IMPLANT
CHLORAPREP W/TINT 26 (MISCELLANEOUS) ×4 IMPLANT
CLAMP PIN 45MM 1 BAR (EXFIX) ×2 IMPLANT
CLEANER TIP ELECTROSURG 2X2 (MISCELLANEOUS) ×5 IMPLANT
CNTNR URN SCR LID CUP LEK RST (MISCELLANEOUS) ×4 IMPLANT
CONT SPEC 4OZ STRL OR WHT (MISCELLANEOUS)
COVER MAYO STAND STRL (DRAPES) ×5 IMPLANT
COVER SURGICAL LIGHT HANDLE (MISCELLANEOUS) ×10 IMPLANT
COVER WAND RF STERILE (DRAPES) ×5 IMPLANT
DERMABOND ADVANCED (GAUZE/BANDAGES/DRESSINGS) ×1
DERMABOND ADVANCED .7 DNX12 (GAUZE/BANDAGES/DRESSINGS) IMPLANT
DRAIN PENROSE 1/4X12 LTX STRL (WOUND CARE) IMPLANT
DRAPE C-ARM 42X72 X-RAY (DRAPES) ×1 IMPLANT
DRAPE C-ARMOR (DRAPES) ×5 IMPLANT
DRAPE IMP U-DRAPE 54X76 (DRAPES) ×10 IMPLANT
DRAPE ORTHO SPLIT 77X108 STRL (DRAPES) ×10
DRAPE SURG 17X23 STRL (DRAPES) ×6 IMPLANT
DRAPE SURG ORHT 6 SPLT 77X108 (DRAPES) ×8 IMPLANT
DRAPE U-SHAPE 47X51 STRL (DRAPES) ×6 IMPLANT
DRESSING PEEL AND PLC PRVNA 13 (GAUZE/BANDAGES/DRESSINGS) IMPLANT
DRILL CANN 4.0MM (BIT) ×5
DRSG ADAPTIC 3X8 NADH LF (GAUZE/BANDAGES/DRESSINGS) ×4 IMPLANT
DRSG EMULSION OIL 3X3 NADH (GAUZE/BANDAGES/DRESSINGS) IMPLANT
DRSG MEPITEL 4X7.2 (GAUZE/BANDAGES/DRESSINGS) ×1 IMPLANT
DRSG PEEL AND PLACE PREVENA 13 (GAUZE/BANDAGES/DRESSINGS) ×5
ELECT COATED BLADE 2.86 ST (ELECTRODE) ×5 IMPLANT
ELECT NDL TIP 2.8 STRL (NEEDLE) IMPLANT
ELECT NEEDLE TIP 2.8 STRL (NEEDLE) ×5 IMPLANT
ELECT REM PT RETURN 9FT ADLT (ELECTROSURGICAL) ×5
ELECTRODE REM PT RTRN 9FT ADLT (ELECTROSURGICAL) ×4 IMPLANT
EVACUATOR 1/8 PVC DRAIN (DRAIN) IMPLANT
GAUZE 4X4 16PLY RFD (DISPOSABLE) IMPLANT
GAUZE SPONGE 4X4 12PLY STRL (GAUZE/BANDAGES/DRESSINGS) ×6 IMPLANT
GLOVE BIO SURGEON STRL SZ 6.5 (GLOVE) ×20 IMPLANT
GLOVE BIO SURGEON STRL SZ7.5 (GLOVE) ×20 IMPLANT
GLOVE BIOGEL PI IND STRL 7.5 (GLOVE) ×4 IMPLANT
GLOVE BIOGEL PI INDICATOR 7.5 (GLOVE) ×1
GLOVE SURG UNDER POLY LF SZ6.5 (GLOVE) ×5 IMPLANT
GOWN STRL REUS W/ TWL LRG LVL3 (GOWN DISPOSABLE) ×8 IMPLANT
GOWN STRL REUS W/TWL LRG LVL3 (GOWN DISPOSABLE) ×20
HANDPIECE INTERPULSE COAX TIP (DISPOSABLE) ×5
KIT TURNOVER KIT B (KITS) ×11 IMPLANT
MANIFOLD NEPTUNE II (INSTRUMENTS) ×5 IMPLANT
NDL 25GX 5/8IN NON SAFETY (NEEDLE) IMPLANT
NEEDLE 22X1 1/2 (OR ONLY) (NEEDLE) IMPLANT
NEEDLE 25GX 5/8IN NON SAFETY (NEEDLE) IMPLANT
NS IRRIG 1000ML POUR BTL (IV SOLUTION) ×10 IMPLANT
PACK ORTHO EXTREMITY (CUSTOM PROCEDURE TRAY) ×5 IMPLANT
PAD ARMBOARD 7.5X6 YLW CONV (MISCELLANEOUS) ×10 IMPLANT
PAD CAST 4YDX4 CTTN HI CHSV (CAST SUPPLIES) IMPLANT
PADDING CAST COTTON 4X4 STRL (CAST SUPPLIES) ×5
PADDING CAST COTTON 6X4 STRL (CAST SUPPLIES) ×9 IMPLANT
PENCIL BUTTON HOLSTER BLD 10FT (ELECTRODE) ×5 IMPLANT
PIN 4X100X20MM EXFIX LG BLUNT (EXFIX) ×2 IMPLANT
PIN HALF 5X160X35 BLUNT TIP (EXFIX) ×2 IMPLANT
PIN STEINMAN 2X229 (PIN) ×1 IMPLANT
SET HNDPC FAN SPRY TIP SCT (DISPOSABLE) IMPLANT
SPONGE LAP 18X18 RF (DISPOSABLE) ×5 IMPLANT
STAPLER VISISTAT 35W (STAPLE) ×4 IMPLANT
STRIP CLOSURE SKIN 1/2X4 (GAUZE/BANDAGES/DRESSINGS) IMPLANT
SUT CHROMIC 4 0 P 3 18 (SUTURE) ×5 IMPLANT
SUT ETHILON 2 0 FS 18 (SUTURE) ×8 IMPLANT
SUT ETHILON 3 0 PS 1 (SUTURE) ×9 IMPLANT
SUT ETHILON 4 0 PS 2 18 (SUTURE) ×5 IMPLANT
SUT ETHILON 5 0 P 3 18 (SUTURE) ×5
SUT MON AB 2-0 CT1 36 (SUTURE) ×4 IMPLANT
SUT MON AB 5-0 PS2 18 (SUTURE) ×2 IMPLANT
SUT NYLON ETHILON 5-0 P-3 1X18 (SUTURE) ×4 IMPLANT
SUT PDS AB 0 CT 36 (SUTURE) IMPLANT
SUT PROLENE 0 CT (SUTURE) IMPLANT
SUT SILK 4 0 (SUTURE) ×5
SUT SILK 4-0 18XBRD TIE 12 (SUTURE) ×4 IMPLANT
SUT VIC AB 4-0 PS2 27 (SUTURE) ×1 IMPLANT
SUT VIC AB 5-0 RB1 27 (SUTURE) ×1 IMPLANT
SWAB COLLECTION DEVICE MRSA (MISCELLANEOUS) IMPLANT
SWAB CULTURE ESWAB REG 1ML (MISCELLANEOUS) IMPLANT
SYR BULB IRRIG 60ML STRL (SYRINGE) IMPLANT
SYR TB 1ML LUER SLIP (SYRINGE) IMPLANT
TOWEL GREEN STERILE (TOWEL DISPOSABLE) ×10 IMPLANT
TOWEL GREEN STERILE FF (TOWEL DISPOSABLE) ×10 IMPLANT
TRAY ENT MC OR (CUSTOM PROCEDURE TRAY) ×5 IMPLANT
TUBE CONNECTING 12X1/4 (SUCTIONS) ×5 IMPLANT
UNDERPAD 30X36 HEAVY ABSORB (UNDERPADS AND DIAPERS) ×5 IMPLANT
WATER STERILE IRR 1000ML POUR (IV SOLUTION) ×5 IMPLANT
YANKAUER SUCT BULB TIP NO VENT (SUCTIONS) ×6 IMPLANT

## 2020-03-29 NOTE — Progress Notes (Signed)
Orthopedic Tech Progress Note Patient Details:  Jeremy George [birth year] [id number]  Musculoskeletal Traction Type of Traction: Skeletal (Balanced Suspension) Traction Location: Left Lower Extremity Traction Weight: 20 lbs       Gerald Stabs 03/29/2020, 3:51 PM
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
## 2020-03-29 NOTE — ED Notes (Signed)
Report called and given

## 2020-03-29 NOTE — Progress Notes (Signed)
CXR placed for Central line confirmation. Imaging results noted. Anesthesiologist aware. May use central line. Placement good.

## 2020-03-29 NOTE — Anesthesia Procedure Notes (Addendum)
Central Venous Catheter Insertion Performed by: Kipp Brood, MD, anesthesiologist Start/End2/07/2020 1:25 PM, 03/29/2020 1:30 PM Patient location: Pre-op. Preanesthetic checklist: patient identified, site marked, monitors and equipment checked, pre-op evaluation, timeout performed and anesthesia consent Lidocaine 1% used for infiltration and patient sedated Hand hygiene performed  and maximum sterile barriers used  Catheter size: 9 Fr Sheath introducer Procedure performed using ultrasound guided technique. Ultrasound Notes:anatomy identified, needle tip was noted to be adjacent to the nerve/plexus identified, no ultrasound evidence of intravascular and/or intraneural injection and image(s) printed for medical record Attempts: 1 Following insertion, line sutured and dressing applied. Post procedure assessment: blood return through all ports, free fluid flow and no air  Patient tolerated the procedure well with no immediate complications.
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
## 2020-03-29 NOTE — Progress Notes (Signed)
Left Chest Tube Insertion Procedure Note  Indications:  Clinically significant Pneumothorax  Pre-operative Diagnosis: Pneumothorax -left  Post-operative Diagnosis: same  Procedure Details  Informed consent was not obtained for the procedure due to the emergent nature of the situation. ,    After sterile skin prep, sterile cap/gloves/drape, using standard technique, a 14 French cook pigtail cook tube was placed in the left lateral 4th rib space. Secured with 0 silk x 2.   Findings: +air, min blood No air leak  Estimated Blood Loss:  Minimal         Specimens:  None              Complications:  None; patient tolerated the procedure well.         Disposition: ED         Condition: stable  Attending Attestation: I performed the procedure.  Mary Sella. Andrey Campanile, MD, FACS General, Bariatric, & Minimally Invasive Surgery [facility] Surgery, [place]

## 2020-03-29 NOTE — ED Notes (Signed)
Patient transported to CT 

## 2020-03-29 NOTE — ED Notes (Signed)
NS at bedside

## 2020-03-29 NOTE — Anesthesia Procedure Notes (Signed)
Arterial Line Insertion Start/End2/07/2020 1:35 PM, 03/29/2020 1:40 PM Performed by: Kipp Brood, MD  Preanesthetic checklist: patient identified, monitors and equipment checked, pre-op evaluation and timeout performed Right, brachial was placed  Attempts: 2 Procedure performed without using ultrasound guided technique. Following insertion, dressing applied and Biopatch. Patient tolerated the procedure well with no immediate complications.
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
## 2020-03-29 NOTE — TOC CAGE-AID Note (Signed)
Transition of Care [facility]) - CAGE-AID Screening   Patient Details  Name: Jeremy George MRN: [id number] Date of Birth: Jul 16, 1995   Contact:    Judie Bonus, RN Phone Number: 03/29/2020, 12:08 PM   Clinical Narrative:  Unable to participate, pt intubated, initial etoh 184  CAGE-AID Screening: Substance Abuse Screening unable to be completed due to: : Patient unable to participate

## 2020-03-29 NOTE — Progress Notes (Signed)
Hypoglycemic Event  CBG: 54  Treatment: D50 amp  Symptoms: N/a  Follow-up CBG: Time:1838 CBG Result: 131  Possible Reasons for Event: NPO  Comments/MD notified:IVF ordered    Jeremy George Wisam Siefring

## 2020-03-29 NOTE — Anesthesia Procedure Notes (Signed)
Date/Time: 03/29/2020 1:00 PM Performed by: Gwenyth Allegra, CRNA Pre-anesthesia Checklist: Patient identified, Emergency Drugs available, Suction available and Patient being monitored Patient Re-evaluated:Patient Re-evaluated prior to induction Oxygen Delivery Method: Circle system utilized Preoxygenation: Pre-oxygenation with 100% oxygen Placement Confirmation: positive ETCO2 and breath sounds checked- equal and bilateral Secured at: 23 cm Tube secured with: Tape Dental Injury: Teeth and Oropharynx as per pre-operative assessment
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
## 2020-03-29 NOTE — Progress Notes (Signed)
Abnormal ABG results reported to MD. Changed RR to 24

## 2020-03-29 NOTE — Transfer of Care (Signed)
Immediate Anesthesia Transfer of Care Note  Patient: Jeremy George  Procedure(s) Performed: CLOSED REDUCTION HIP (Left Hip) IRRIGATION AND DEBRIDEMENT LEFT ELBOW (Left Elbow) EXTERNAL FIXATION ELBOW (Left Elbow) IRRIGATION AND DEBRIDEMENT LEFT KNEE AND TRACTION PIN (Left Knee) APPLICATION OF WOUND VAC LEFT ELBOW (Left Elbow) REPAIR MULTIPLE LACERATIONS FACIAL (N/A )  Patient Location: ICU  Anesthesia Type:General  Level of Consciousness: sedated and Patient remains intubated per anesthesia plan  Airway & Oxygen Therapy: Patient remains intubated per anesthesia plan and Patient placed on Ventilator (see vital sign flow sheet for setting)  Post-op Assessment: Report given to RN and Post -op Vital signs reviewed and stable  Post vital signs: Reviewed and stable  Last Vitals:  Vitals Value Taken Time  BP 114/103 03/29/20 1609  Temp 35.3 C 03/29/20 1611  Pulse    Resp 18 03/29/20 1611  SpO2 100 % 03/29/20 1604  Vitals shown include unvalidated device data.  Last Pain:  Vitals:   03/29/20 1245  TempSrc: Bladder         Complications: No complications documented.

## 2020-03-29 NOTE — ED Notes (Signed)
Ortho consult done with Dr. [name] while he was in OR case, pt to be posted for OR asap.

## 2020-03-29 NOTE — ED Notes (Signed)
NS contacted by Dr. Andrey Campanile while patient in CT  [place], [place] [phone]

## 2020-03-29 NOTE — Anesthesia Procedure Notes (Addendum)
Central Venous Catheter Insertion Performed by: Kipp Brood, MD, anesthesiologist Start/End2/07/2020 1:25 PM, 03/29/2020 1:30 PM Patient location: OR. Preanesthetic checklist: patient identified, IV checked, site marked and timeout performed Position: supine Catheter size: 7 Fr Central line was placed.Triple lumen Procedure performed without using ultrasound guided technique. Attempts: 1 Following insertion, dressing applied and Biopatch. Post procedure assessment: blood return through all ports, free fluid flow and no air  Additional procedure comments: 3 lumen central line inserted through L. IJ 89F introducer.
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
## 2020-03-30 ENCOUNTER — Inpatient Hospital Stay (HOSPITAL_COMMUNITY): Payer: Medicaid Other

## 2020-03-30 ENCOUNTER — Encounter (HOSPITAL_COMMUNITY): Payer: Self-pay | Admitting: Student

## 2020-03-30 LAB — POCT I-STAT 7, (LYTES, BLD GAS, ICA,H+H)
Acid-base deficit: 4 mmol/L — ABNORMAL HIGH (ref 0.0–2.0)
Bicarbonate: 20.9 mmol/L (ref 20.0–28.0)
Calcium, Ion: 1.08 mmol/L — ABNORMAL LOW (ref 1.15–1.40)
HCT: 29 % — ABNORMAL LOW (ref 39.0–52.0)
Hemoglobin: 9.9 g/dL — ABNORMAL LOW (ref 13.0–17.0)
O2 Saturation: 95 %
Patient temperature: 37.8
Potassium: 3.6 mmol/L (ref 3.5–5.1)
Sodium: 147 mmol/L — ABNORMAL HIGH (ref 135–145)
TCO2: 22 mmol/L (ref 22–32)
pCO2 arterial: 35.6 mmHg (ref 32.0–48.0)
pH, Arterial: 7.38 (ref 7.350–7.450)
pO2, Arterial: 81 mmHg — ABNORMAL LOW (ref 83.0–108.0)

## 2020-03-30 LAB — CBC
HCT: 32.2 % — ABNORMAL LOW (ref 39.0–52.0)
HCT: 33.3 % — ABNORMAL LOW (ref 39.0–52.0)
HCT: 31 % — ABNORMAL LOW (ref 39.0–52.0)
Hemoglobin: 11.5 g/dL — ABNORMAL LOW (ref 13.0–17.0)
Hemoglobin: 11.5 g/dL — ABNORMAL LOW (ref 13.0–17.0)
Hemoglobin: 10.4 g/dL — ABNORMAL LOW (ref 13.0–17.0)
MCH: 30.4 pg (ref 26.0–34.0)
MCH: 29.6 pg (ref 26.0–34.0)
MCH: 29.1 pg (ref 26.0–34.0)
MCHC: 35.7 g/dL (ref 30.0–36.0)
MCHC: 34.5 g/dL (ref 30.0–36.0)
MCHC: 33.5 g/dL (ref 30.0–36.0)
MCV: 85.2 fL (ref 80.0–100.0)
MCV: 85.6 fL (ref 80.0–100.0)
MCV: 86.8 fL (ref 80.0–100.0)
Platelets: 129 10*3/uL — ABNORMAL LOW (ref 150–400)
Platelets: 120 10*3/uL — ABNORMAL LOW (ref 150–400)
Platelets: 102 10*3/uL — ABNORMAL LOW (ref 150–400)
RBC: 3.78 MIL/uL — ABNORMAL LOW (ref 4.22–5.81)
RBC: 3.89 MIL/uL — ABNORMAL LOW (ref 4.22–5.81)
RBC: 3.57 MIL/uL — ABNORMAL LOW (ref 4.22–5.81)
RDW: 14.3 % (ref 11.5–15.5)
RDW: 14.5 % (ref 11.5–15.5)
RDW: 14.3 % (ref 11.5–15.5)
WBC: 9.7 10*3/uL (ref 4.0–10.5)
WBC: 11.1 10*3/uL — ABNORMAL HIGH (ref 4.0–10.5)
WBC: 9.3 10*3/uL (ref 4.0–10.5)
nRBC: 0 % (ref 0.0–0.2)
nRBC: 0 % (ref 0.0–0.2)
nRBC: 0 % (ref 0.0–0.2)

## 2020-03-30 LAB — COMPREHENSIVE METABOLIC PANEL
ALT: 54 U/L — ABNORMAL HIGH (ref 0–44)
AST: 163 U/L — ABNORMAL HIGH (ref 15–41)
Albumin: 2.6 g/dL — ABNORMAL LOW (ref 3.5–5.0)
Alkaline Phosphatase: 31 U/L — ABNORMAL LOW (ref 38–126)
Anion gap: 10 (ref 5–15)
BUN: 9 mg/dL (ref 6–20)
CO2: 20 mmol/L — ABNORMAL LOW (ref 22–32)
Calcium: 7.4 mg/dL — ABNORMAL LOW (ref 8.9–10.3)
Chloride: 112 mmol/L — ABNORMAL HIGH (ref 98–111)
Creatinine, Ser: 0.87 mg/dL (ref 0.61–1.24)
GFR, Estimated: >60 mL/min (ref >60)
Glucose, Bld: 126 mg/dL — ABNORMAL HIGH (ref 70–99)
Potassium: 3.8 mmol/L (ref 3.5–5.1)
Sodium: 142 mmol/L (ref 135–145)
Total Bilirubin: 0.6 mg/dL (ref 0.3–1.2)
Total Protein: 4.3 g/dL — ABNORMAL LOW (ref 6.5–8.1)

## 2020-03-30 LAB — HIV ANTIBODY (ROUTINE TESTING W REFLEX): HIV Screen 4th Generation wRfx: NONREACTIVE

## 2020-03-30 LAB — GLUCOSE, CAPILLARY
Glucose-Capillary: 132 mg/dL — ABNORMAL HIGH (ref 70–99)
Glucose-Capillary: 75 mg/dL (ref 70–99)
Glucose-Capillary: 83 mg/dL (ref 70–99)
Glucose-Capillary: 85 mg/dL (ref 70–99)
Glucose-Capillary: 93 mg/dL (ref 70–99)
Glucose-Capillary: 95 mg/dL (ref 70–99)

## 2020-03-30 LAB — VITAMIN D 25 HYDROXY (VIT D DEFICIENCY, FRACTURES): Vit D, 25-Hydroxy: 12.29 ng/mL — ABNORMAL LOW (ref 30–100)

## 2020-03-30 LAB — LACTIC ACID, PLASMA: Lactic Acid, Venous: 1.7 mmol/L (ref 0.5–1.9)

## 2020-03-30 LAB — TRIGLYCERIDES: Triglycerides: 130 mg/dL (ref <150)

## 2020-03-30 LAB — BLOOD PRODUCT ORDER (VERBAL) VERIFICATION

## 2020-03-30 IMAGING — DX DG CHEST 1V PORT
2 series · 2 of 2 positions shown · non-contrast
Comparison: [DATE]

CLINICAL DATA: Pneumothorax

EXAM:
PORTABLE CHEST 1 VIEW

[chest ap (1 of 2)]
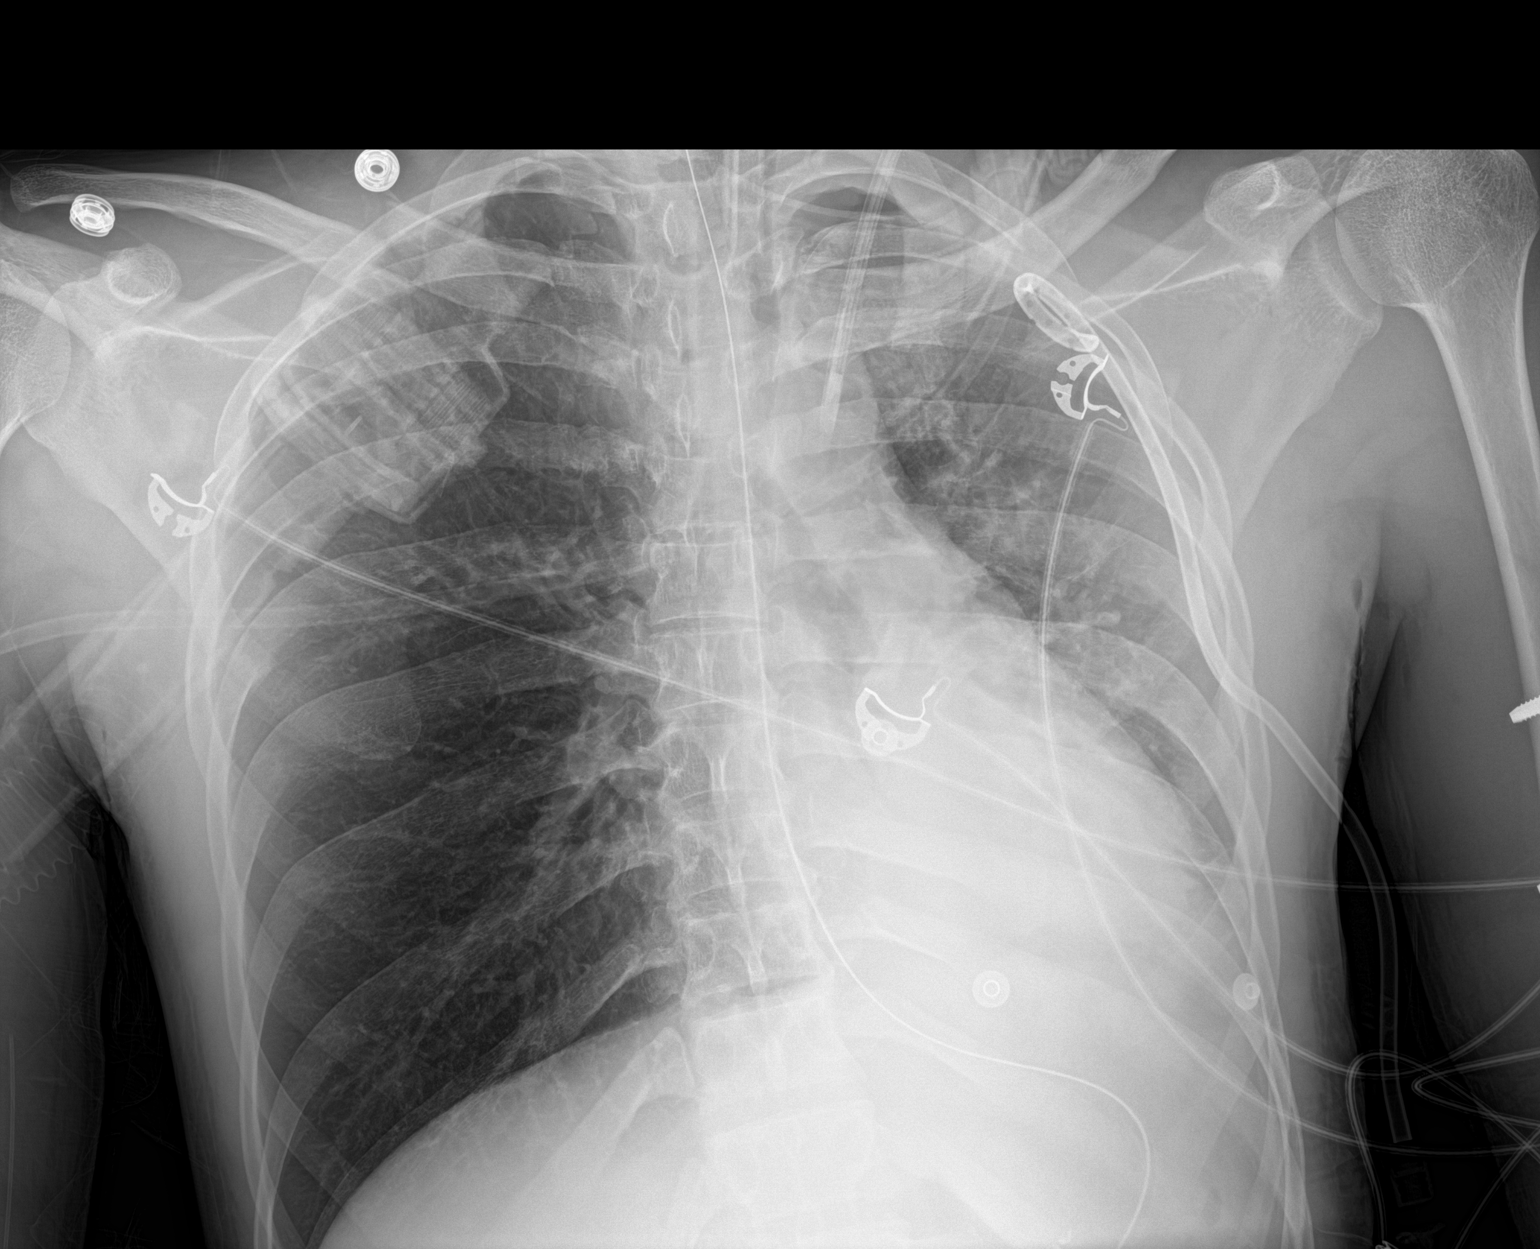

[chest ap (2 of 2)]
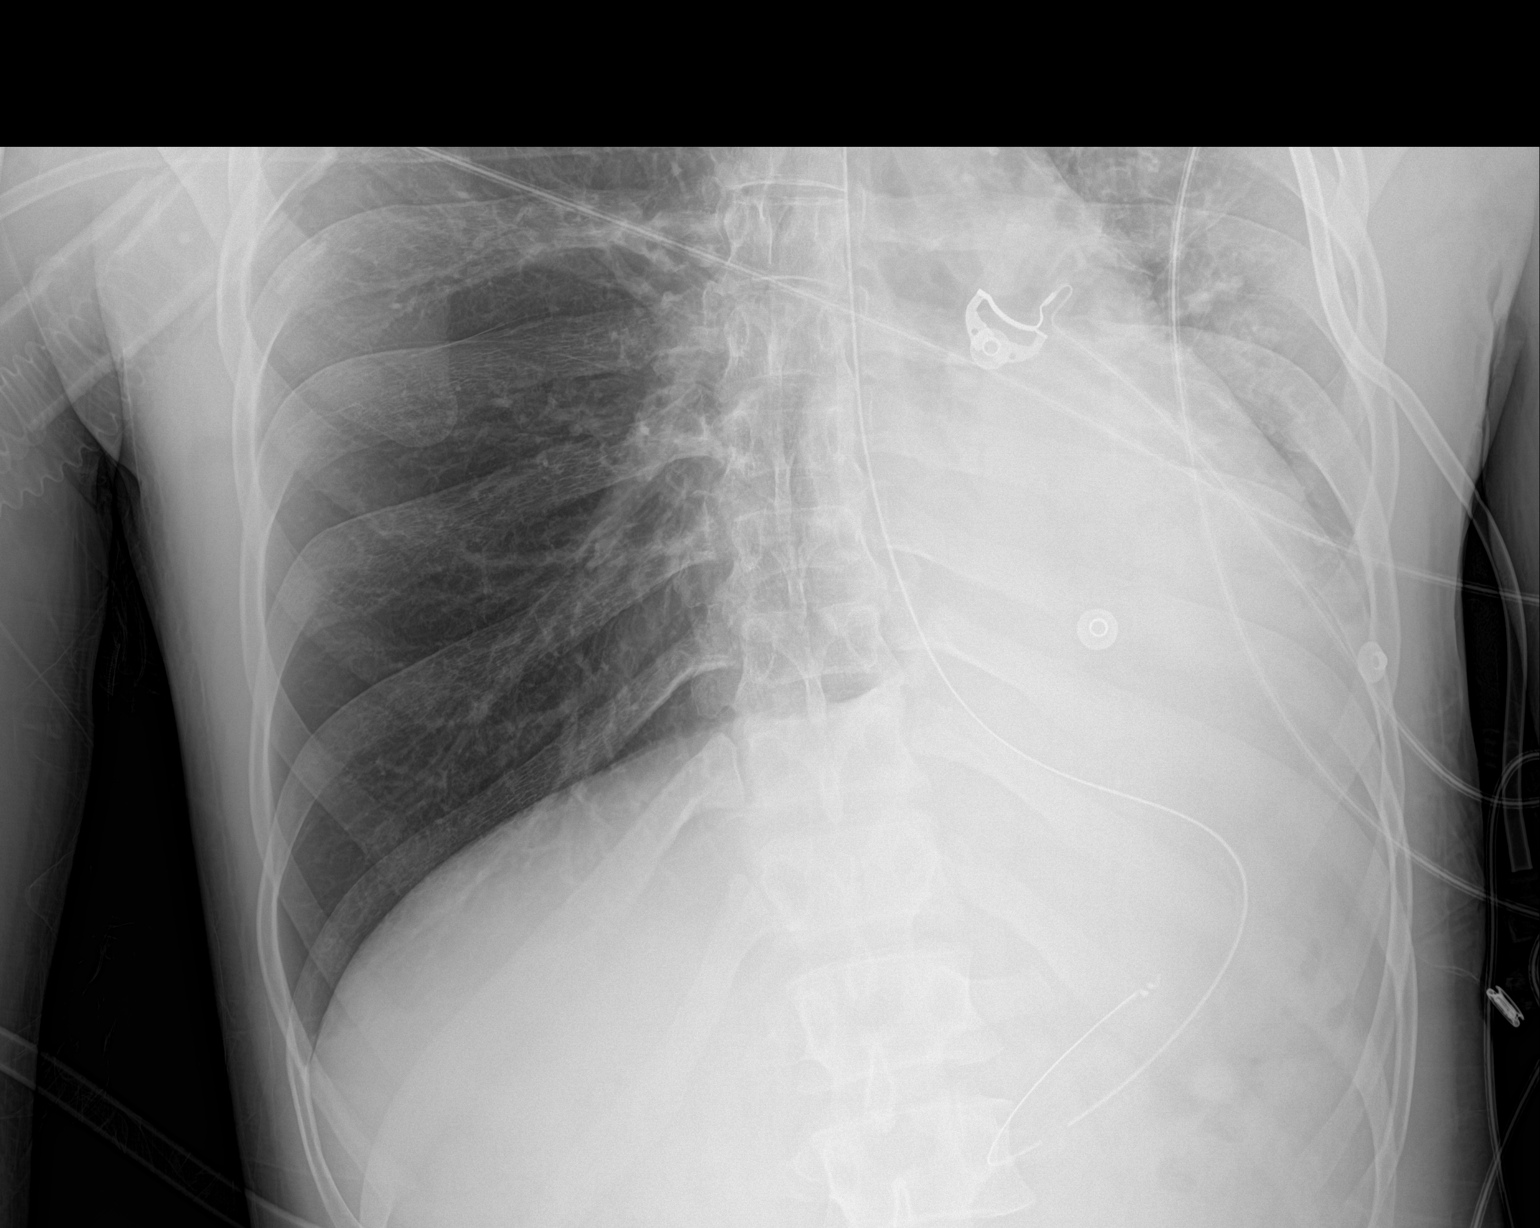

[2 of 2 positions shown; findings below may reference images not displayed]

FINDINGS: Left chest tube remains in place. Small left apical pneumothorax.
High left lung airspace disease most pronounced in the left lower
lobe. Endotracheal tube is 7 cm above the carina. NG tube is in the
stomach. Left central line tip is in stable position, not crossing
the midline. Exact position cannot be determined.
IMPRESSION: Left chest tube in place with small left apical pneumothorax.

Left lung airspace disease, increasing since prior study.

## 2020-03-30 MED ORDER — CLONAZEPAM 0.5 MG PO TABS
0.5000 mg | ORAL_TABLET | Freq: Two times a day (BID) | ORAL | Status: DC
  Administered 2020-03-30 – 2020-04-01 (×5): 0.5 mg
  Filled 2020-03-30 (×5): qty 1

## 2020-03-30 MED ORDER — ACETAMINOPHEN 160 MG/5ML PO SOLN
650.0000 mg | Freq: Four times a day (QID) | ORAL | Status: DC | PRN
  Administered 2020-03-30 – 2020-04-01 (×5): 650 mg
  Filled 2020-03-30 (×5): qty 20.3

## 2020-03-30 MED ORDER — ACETAMINOPHEN 160 MG/5ML PO SOLN: 650.0000 mg | Freq: Four times a day (QID) | ORAL | Status: DC | PRN

## 2020-03-30 MED ORDER — FENTANYL 2500MCG IN NS 250ML (10MCG/ML) PREMIX INFUSION
0.0000 ug/h | INTRAVENOUS | Status: DC
  Administered 2020-03-30 – 2020-04-01 (×3): 100 ug/h via INTRAVENOUS
  Administered 2020-04-02: 200 ug/h via INTRAVENOUS
  Administered 2020-04-02: 125 ug/h via INTRAVENOUS
  Administered 2020-04-03 – 2020-04-05 (×7): 200 ug/h via INTRAVENOUS
  Administered 2020-04-06 – 2020-04-07 (×6): 400 ug/h via INTRAVENOUS
  Administered 2020-04-08 (×2): 300 ug/h via INTRAVENOUS
  Administered 2020-04-09 – 2020-04-10 (×2): 50 ug/h via INTRAVENOUS
  Filled 2020-03-30 (×24): qty 250

## 2020-03-30 MED ORDER — POTASSIUM CHLORIDE IN NACL 20-0.9 MEQ/L-% IV SOLN
INTRAVENOUS | Status: DC
  Filled 2020-03-30 (×13): qty 1000

## 2020-03-30 MED ORDER — ALBUMIN HUMAN 5 % IV SOLN
INTRAVENOUS | Status: AC
  Administered 2020-03-30: 12.5 g via INTRAVENOUS
  Filled 2020-03-30: qty 250

## 2020-03-30 MED ORDER — SODIUM CHLORIDE 0.9 % IV SOLN: INTRAVENOUS | Status: DC

## 2020-03-30 MED ORDER — ALBUMIN HUMAN 5 % IV SOLN: 12.5000 g | Freq: Once | INTRAVENOUS | Status: AC

## 2020-03-30 MED ORDER — PIVOT 1.5 CAL PO LIQD
1000.0000 mL | ORAL | Status: DC
  Administered 2020-03-30: 1000 mL
  Filled 2020-03-30: qty 1000

## 2020-03-30 MED ORDER — OXYCODONE HCL 5 MG/5ML PO SOLN
10.0000 mg | ORAL | Status: DC | PRN
  Administered 2020-03-30 – 2020-04-08 (×13): 10 mg
  Filled 2020-03-30 (×14): qty 10

## 2020-03-30 MED ORDER — FENTANYL BOLUS VIA INFUSION
50.0000 ug | INTRAVENOUS | Status: DC | PRN
  Administered 2020-03-30 – 2020-03-31 (×2): 50 ug via INTRAVENOUS
  Filled 2020-03-30: qty 50

## 2020-03-30 MED ORDER — FENTANYL CITRATE (PF) 100 MCG/2ML IJ SOLN: 50.0000 ug | Freq: Once | INTRAMUSCULAR | Status: DC

## 2020-03-30 MED ORDER — QUETIAPINE FUMARATE 25 MG PO TABS
50.0000 mg | ORAL_TABLET | Freq: Two times a day (BID) | ORAL | Status: DC
  Administered 2020-03-30 – 2020-04-01 (×5): 50 mg
  Filled 2020-03-30 (×5): qty 2
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
## 2020-03-30 NOTE — Anesthesia Postprocedure Evaluation (Signed)
Anesthesia Post Note  Patient: Jeremy George  Procedure(s) Performed: CLOSED REDUCTION HIP (Left Hip) IRRIGATION AND DEBRIDEMENT LEFT ELBOW (Left Elbow) EXTERNAL FIXATION ELBOW (Left Elbow) IRRIGATION AND DEBRIDEMENT LEFT KNEE AND TRACTION PIN (Left Knee) APPLICATION OF WOUND VAC LEFT ELBOW (Left Elbow) REPAIR MULTIPLE LACERATIONS FACIAL (N/A )     Patient location during evaluation: NICU Anesthesia Type: General Level of consciousness: sedated and patient remains intubated per anesthesia plan Pain management: pain level controlled Vital Signs Assessment: post-procedure vital signs reviewed and stable Respiratory status: patient remains intubated per anesthesia plan and patient on ventilator - see flowsheet for VS Cardiovascular status: stable Postop Assessment: no apparent nausea or vomiting Anesthetic complications: no   No complications documented.  Last Vitals:  Vitals:   03/29/20 2045 03/29/20 2100  BP: (!) 142/99 (!) 154/95  Pulse:    Resp: (!) 24 (!) 24  Temp: (!) 38.8 C (!) 38.8 C  SpO2:      Last Pain:  Vitals:   03/29/20 1730  TempSrc: Bladder                 Christopherjohn Schiele COKER
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
## 2020-03-30 NOTE — Anesthesia Preprocedure Evaluation (Signed)
Anesthesia Evaluation  Patient identified by MRN, date of birth, ID band Patient unresponsive    Reviewed: Patient's Chart, lab work & pertinent test results, Unable to perform ROS - Chart review onlyPreop documentation limited or incomplete due to emergent nature of procedure.  Airway Mallampati: Intubated      Comment: Patient intubated in C-collar, multiple facial lacerations Dental   Pulmonary    + rhonchi  + decreased breath sounds      Cardiovascular  Rhythm:Regular Rate:Tachycardia     Neuro/Psych    GI/Hepatic   Endo/Other    Renal/GU      Musculoskeletal   Abdominal   Peds  Hematology   Anesthesia Other Findings   Reproductive/Obstetrics                             Anesthesia Physical Anesthesia Plan  ASA: IV and emergent  Anesthesia Plan: General   Post-op Pain Management:    Induction: Intravenous  PONV Risk Score and Plan:   Airway Management Planned: Oral ETT  Additional Equipment: Arterial line, CVP and Ultrasound Guidance Line Placement  Intra-op Plan:   Post-operative Plan: Post-operative intubation/ventilation  Informed Consent: I have reviewed the patients History and Physical, chart, labs and discussed the procedure including the risks, benefits and alternatives for the proposed anesthesia with the patient or authorized representative who has indicated his/her understanding and acceptance.       Plan Discussed with: CRNA and Anesthesiologist  Anesthesia Plan Comments:         Anesthesia Quick Evaluation
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
## 2020-03-30 NOTE — Progress Notes (Signed)
Paged Dr. Freida Busman regarding HR sustaining in 130's and fever of 102 after tylenol. New order for blood cultures and order to give 1 mg of ativan.
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
## 2020-03-31 ENCOUNTER — Inpatient Hospital Stay (HOSPITAL_COMMUNITY): Payer: Medicaid Other

## 2020-03-31 LAB — CBC
HCT: 28.6 % — ABNORMAL LOW (ref 39.0–52.0)
Hemoglobin: 9.4 g/dL — ABNORMAL LOW (ref 13.0–17.0)
MCH: 29.2 pg (ref 26.0–34.0)
MCHC: 32.9 g/dL (ref 30.0–36.0)
MCV: 88.8 fL (ref 80.0–100.0)
Platelets: 101 10*3/uL — ABNORMAL LOW (ref 150–400)
RBC: 3.22 MIL/uL — ABNORMAL LOW (ref 4.22–5.81)
RDW: 14.2 % (ref 11.5–15.5)
WBC: 9.8 10*3/uL (ref 4.0–10.5)
nRBC: 0 % (ref 0.0–0.2)

## 2020-03-31 LAB — BASIC METABOLIC PANEL
Anion gap: 8 (ref 5–15)
BUN: 7 mg/dL (ref 6–20)
CO2: 21 mmol/L — ABNORMAL LOW (ref 22–32)
Calcium: 7.7 mg/dL — ABNORMAL LOW (ref 8.9–10.3)
Chloride: 112 mmol/L — ABNORMAL HIGH (ref 98–111)
Creatinine, Ser: 0.86 mg/dL (ref 0.61–1.24)
GFR, Estimated: >60 mL/min (ref >60)
Glucose, Bld: 104 mg/dL — ABNORMAL HIGH (ref 70–99)
Potassium: 3.6 mmol/L (ref 3.5–5.1)
Sodium: 141 mmol/L (ref 135–145)

## 2020-03-31 LAB — GLUCOSE, CAPILLARY
Glucose-Capillary: 115 mg/dL — ABNORMAL HIGH (ref 70–99)
Glucose-Capillary: 163 mg/dL — ABNORMAL HIGH (ref 70–99)
Glucose-Capillary: 188 mg/dL — ABNORMAL HIGH (ref 70–99)
Glucose-Capillary: 86 mg/dL (ref 70–99)
Glucose-Capillary: 87 mg/dL (ref 70–99)
Glucose-Capillary: 93 mg/dL (ref 70–99)
Glucose-Capillary: 99 mg/dL (ref 70–99)

## 2020-03-31 IMAGING — DX DG CHEST 1V PORT
1 series · 1 of 1 positions shown · non-contrast
Comparison: [DATE]

CLINICAL DATA: Pneumothorax.

EXAM:
PORTABLE CHEST 1 VIEW

[chest ap]
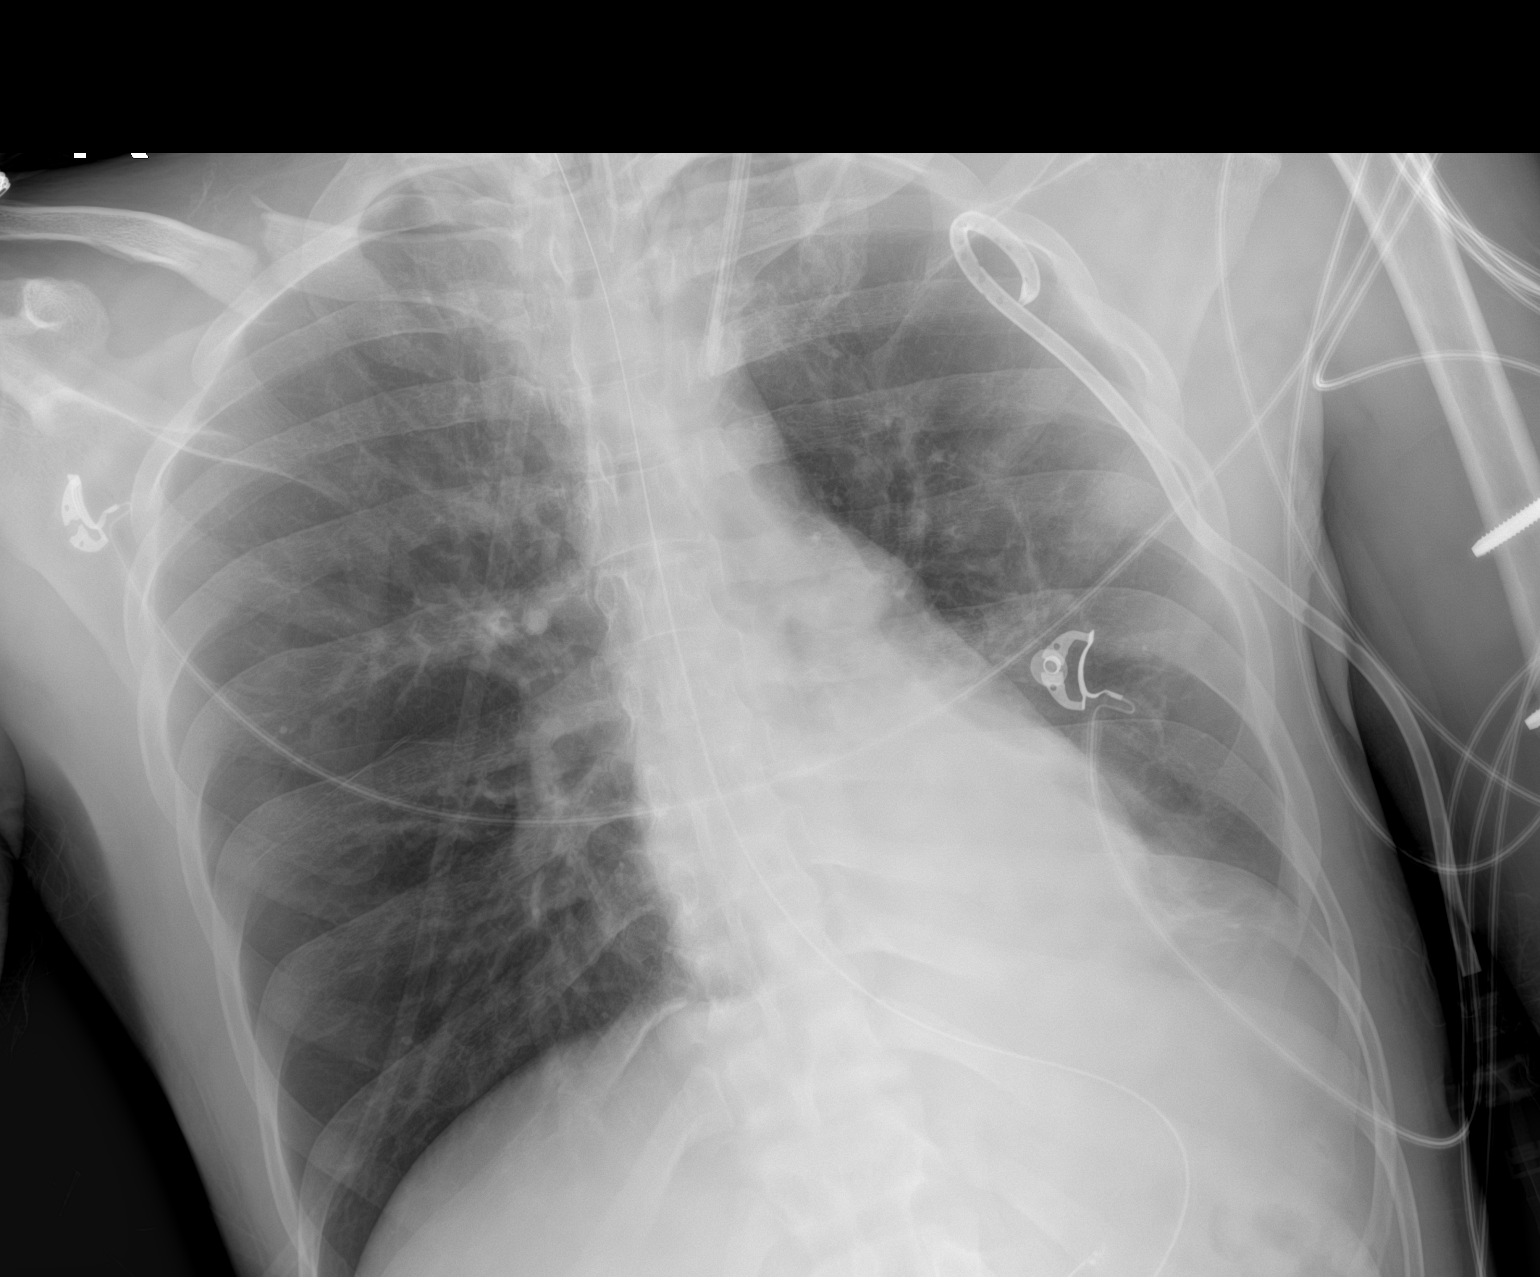

[1 of 1 positions shown; findings below may reference images not displayed]

FINDINGS: [45] hours. Left chest tube remains in place. The tiny apical left
pneumothorax seen previously has almost completely resolved in the
interval. Endotracheal tube tip is 8.3 cm above the base of the
carina. The NG tube passes into the stomach although the distal tip
position is not included on the film. Left central line again noted
in the supraclavicular region, tip position cannot be confirmed on
this film. Persistent retrocardiac left base collapse/consolidation.
Right lung clear.
IMPRESSION: 1. Near complete resolution of tiny left apical pneumothorax seen
previously.
2. Persistent retrocardiac left base collapse/consolidation.

## 2020-03-31 MED ORDER — PIVOT 1.5 CAL PO LIQD
1000.0000 mL | ORAL | Status: DC
  Administered 2020-03-31 – 2020-04-01 (×2): 1000 mL
  Filled 2020-03-31 (×2): qty 1000

## 2020-03-31 MED ORDER — DEXMEDETOMIDINE HCL IN NACL 400 MCG/100ML IV SOLN
0.4000 ug/kg/h | INTRAVENOUS | Status: DC
  Administered 2020-03-31: 0.4 ug/kg/h via INTRAVENOUS
  Administered 2020-03-31: 0.6 ug/kg/h via INTRAVENOUS
  Administered 2020-04-01 (×2): 1 ug/kg/h via INTRAVENOUS
  Administered 2020-04-01: 0.4 ug/kg/h via INTRAVENOUS
  Administered 2020-04-01: 0.9 ug/kg/h via INTRAVENOUS
  Administered 2020-04-02 (×5): 1.2 ug/kg/h via INTRAVENOUS
  Administered 2020-04-03: 1 ug/kg/h via INTRAVENOUS
  Administered 2020-04-03 – 2020-04-08 (×18): 1.2 ug/kg/h via INTRAVENOUS
  Administered 2020-04-08: 2 ug/kg/h via INTRAVENOUS
  Administered 2020-04-08: 1.4 ug/kg/h via INTRAVENOUS
  Administered 2020-04-08: 1.9 ug/kg/h via INTRAVENOUS
  Administered 2020-04-08 – 2020-04-09 (×11): 2 ug/kg/h via INTRAVENOUS
  Administered 2020-04-10: 1.6 ug/kg/h via INTRAVENOUS
  Administered 2020-04-10 (×5): 2 ug/kg/h via INTRAVENOUS
  Administered 2020-04-10: 1.6 ug/kg/h via INTRAVENOUS
  Administered 2020-04-10 – 2020-04-11 (×6): 2 ug/kg/h via INTRAVENOUS
  Administered 2020-04-11: 1.8 ug/kg/h via INTRAVENOUS
  Administered 2020-04-11 (×2): 2 ug/kg/h via INTRAVENOUS
  Administered 2020-04-11: 1.8 ug/kg/h via INTRAVENOUS
  Administered 2020-04-12 (×3): 2 ug/kg/h via INTRAVENOUS
  Administered 2020-04-12: 0.965 ug/kg/h via INTRAVENOUS
  Administered 2020-04-12 – 2020-04-14 (×16): 2 ug/kg/h via INTRAVENOUS
  Administered 2020-04-14: 1.7 ug/kg/h via INTRAVENOUS
  Administered 2020-04-14: 2 ug/kg/h via INTRAVENOUS
  Administered 2020-04-14: 1.6 ug/kg/h via INTRAVENOUS
  Administered 2020-04-14 (×2): 1.7 ug/kg/h via INTRAVENOUS
  Administered 2020-04-14 (×2): 2 ug/kg/h via INTRAVENOUS
  Administered 2020-04-15: 1.6 ug/kg/h via INTRAVENOUS
  Administered 2020-04-15: 2 ug/kg/h via INTRAVENOUS
  Administered 2020-04-15: 1 ug/kg/h via INTRAVENOUS
  Administered 2020-04-15: 1.8 ug/kg/h via INTRAVENOUS
  Administered 2020-04-15: 2 ug/kg/h via INTRAVENOUS
  Administered 2020-04-15: 1 ug/kg/h via INTRAVENOUS
  Administered 2020-04-15: 2 ug/kg/h via INTRAVENOUS
  Administered 2020-04-16: 1.2 ug/kg/h via INTRAVENOUS
  Administered 2020-04-16: 1 ug/kg/h via INTRAVENOUS
  Administered 2020-04-16: 0.7 ug/kg/h via INTRAVENOUS
  Administered 2020-04-16: 0.8 ug/kg/h via INTRAVENOUS
  Administered 2020-04-16: 1 ug/kg/h via INTRAVENOUS
  Administered 2020-04-17: 1.2 ug/kg/h via INTRAVENOUS
  Administered 2020-04-17: 0.7 ug/kg/h via INTRAVENOUS
  Administered 2020-04-17: 0.6 ug/kg/h via INTRAVENOUS
  Administered 2020-04-17: 0.8 ug/kg/h via INTRAVENOUS
  Administered 2020-04-18: 0.6 ug/kg/h via INTRAVENOUS
  Administered 2020-04-18: 0.4 ug/kg/h via INTRAVENOUS
  Administered 2020-04-19 (×3): 0.6 ug/kg/h via INTRAVENOUS
  Administered 2020-04-20: 0.3 ug/kg/h via INTRAVENOUS
  Administered 2020-04-20: 0.8 ug/kg/h via INTRAVENOUS
  Filled 2020-03-31: qty 100
  Filled 2020-03-31: qty 200
  Filled 2020-03-31 (×6): qty 100
  Filled 2020-03-31: qty 200
  Filled 2020-03-31 (×4): qty 100
  Filled 2020-03-31: qty 300
  Filled 2020-03-31: qty 200
  Filled 2020-03-31: qty 100
  Filled 2020-03-31: qty 200
  Filled 2020-03-31 (×4): qty 100
  Filled 2020-03-31: qty 200
  Filled 2020-03-31 (×7): qty 100
  Filled 2020-03-31: qty 200
  Filled 2020-03-31 (×2): qty 100
  Filled 2020-03-31: qty 200
  Filled 2020-03-31 (×9): qty 100
  Filled 2020-03-31 (×2): qty 200
  Filled 2020-03-31 (×12): qty 100
  Filled 2020-03-31: qty 200
  Filled 2020-03-31 (×14): qty 100
  Filled 2020-03-31 (×2): qty 200
  Filled 2020-03-31 (×5): qty 100
  Filled 2020-03-31 (×2): qty 200
  Filled 2020-03-31 (×8): qty 100
  Filled 2020-03-31: qty 200
  Filled 2020-03-31 (×3): qty 100
  Filled 2020-03-31 (×4): qty 200
  Filled 2020-03-31 (×2): qty 100
  Filled 2020-03-31 (×2): qty 200
  Filled 2020-03-31: qty 100

## 2020-03-31 MED ORDER — METOPROLOL TARTRATE 25 MG/10 ML ORAL SUSPENSION
25.0000 mg | Freq: Two times a day (BID) | ORAL | Status: DC
  Administered 2020-03-31 – 2020-04-26 (×51): 25 mg
  Filled 2020-03-31 (×52): qty 10

## 2020-03-31 MED ORDER — ENOXAPARIN SODIUM 30 MG/0.3ML ~~LOC~~ SOLN
30.0000 mg | Freq: Two times a day (BID) | SUBCUTANEOUS | Status: DC
Start: 2020-03-31 — End: 2020-04-01
  Administered 2020-03-31 (×2): 30 mg via SUBCUTANEOUS
  Filled 2020-03-31 (×2): qty 0.3
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
## 2020-03-31 NOTE — Progress Notes (Signed)
Patient ID: Jeremy George, male   DOB: [birth year], 25 y.o.   MRN: [id number] I spoke with his mother at the bedside and his father on the phone. I updated them and answered their questions. His mother reported that Jeremy George drinks several drinks daily, especially when hanging out with his uncle.  Patient examined and I agree with the assessment and plan  Violeta Gelinas, MD, MPH, FACS Please use [email or web address] to contact on call provider 03/31/2020 10:36 AM

## 2020-04-01 ENCOUNTER — Inpatient Hospital Stay (HOSPITAL_COMMUNITY): Payer: Medicaid Other

## 2020-04-01 ENCOUNTER — Inpatient Hospital Stay (HOSPITAL_COMMUNITY): Payer: Medicaid Other | Admitting: Anesthesiology

## 2020-04-01 ENCOUNTER — Encounter (HOSPITAL_COMMUNITY): Admission: EM | Disposition: A | Discharge status: Rehabilitation Facility | Payer: Self-pay | Source: Home / Self Care

## 2020-04-01 HISTORY — PX: OPEN REDUCTION INTERNAL FIXATION ACETABULUM POSTERIOR LATERAL: SHX6834

## 2020-04-01 HISTORY — PX: ORIF ELBOW FRACTURE: SHX5031

## 2020-04-01 LAB — POCT I-STAT 7, (LYTES, BLD GAS, ICA,H+H)
Acid-base deficit: 7 mmol/L — ABNORMAL HIGH (ref 0.0–2.0)
Acid-base deficit: 6 mmol/L — ABNORMAL HIGH (ref 0.0–2.0)
Acid-base deficit: 7 mmol/L — ABNORMAL HIGH (ref 0.0–2.0)
Acid-base deficit: 6 mmol/L — ABNORMAL HIGH (ref 0.0–2.0)
Bicarbonate: 20.5 mmol/L (ref 20.0–28.0)
Bicarbonate: 22 mmol/L (ref 20.0–28.0)
Bicarbonate: 21.2 mmol/L (ref 20.0–28.0)
Bicarbonate: 20.3 mmol/L (ref 20.0–28.0)
Calcium, Ion: 1.23 mmol/L (ref 1.15–1.40)
Calcium, Ion: 1.19 mmol/L (ref 1.15–1.40)
Calcium, Ion: 1.15 mmol/L (ref 1.15–1.40)
Calcium, Ion: 1.12 mmol/L — ABNORMAL LOW (ref 1.15–1.40)
HCT: 24 % — ABNORMAL LOW (ref 39.0–52.0)
HCT: 23 % — ABNORMAL LOW (ref 39.0–52.0)
HCT: 23 % — ABNORMAL LOW (ref 39.0–52.0)
HCT: 21 % — ABNORMAL LOW (ref 39.0–52.0)
Hemoglobin: 8.2 g/dL — ABNORMAL LOW (ref 13.0–17.0)
Hemoglobin: 7.8 g/dL — ABNORMAL LOW (ref 13.0–17.0)
Hemoglobin: 7.8 g/dL — ABNORMAL LOW (ref 13.0–17.0)
Hemoglobin: 7.1 g/dL — ABNORMAL LOW (ref 13.0–17.0)
O2 Saturation: 76 %
O2 Saturation: 93 %
O2 Saturation: 93 %
O2 Saturation: 94 %
Patient temperature: 38.3
Patient temperature: 36.5
Patient temperature: 35.5
Patient temperature: 35.6
Potassium: 3.6 mmol/L (ref 3.5–5.1)
Potassium: 3.8 mmol/L (ref 3.5–5.1)
Potassium: 4.2 mmol/L (ref 3.5–5.1)
Potassium: 4.1 mmol/L (ref 3.5–5.1)
Sodium: 145 mmol/L (ref 135–145)
Sodium: 143 mmol/L (ref 135–145)
Sodium: 143 mmol/L (ref 135–145)
Sodium: 143 mmol/L (ref 135–145)
TCO2: 22 mmol/L (ref 22–32)
TCO2: 24 mmol/L (ref 22–32)
TCO2: 23 mmol/L (ref 22–32)
TCO2: 22 mmol/L (ref 22–32)
pCO2 arterial: 51.4 mmHg — ABNORMAL HIGH (ref 32.0–48.0)
pCO2 arterial: 59.5 mmHg — ABNORMAL HIGH (ref 32.0–48.0)
pCO2 arterial: 50.8 mmHg — ABNORMAL HIGH (ref 32.0–48.0)
pCO2 arterial: 38.6 mmHg (ref 32.0–48.0)
pH, Arterial: 7.216 — ABNORMAL LOW (ref 7.350–7.450)
pH, Arterial: 7.173 — CL (ref 7.350–7.450)
pH, Arterial: 7.219 — ABNORMAL LOW (ref 7.350–7.450)
pH, Arterial: 7.322 — ABNORMAL LOW (ref 7.350–7.450)
pO2, Arterial: 53 mmHg — ABNORMAL LOW (ref 83.0–108.0)
pO2, Arterial: 83 mmHg (ref 83.0–108.0)
pO2, Arterial: 75 mmHg — ABNORMAL LOW (ref 83.0–108.0)
pO2, Arterial: 69 mmHg — ABNORMAL LOW (ref 83.0–108.0)

## 2020-04-01 LAB — BASIC METABOLIC PANEL
Anion gap: 7 (ref 5–15)
BUN: 9 mg/dL (ref 6–20)
CO2: 21 mmol/L — ABNORMAL LOW (ref 22–32)
Calcium: 7.8 mg/dL — ABNORMAL LOW (ref 8.9–10.3)
Chloride: 112 mmol/L — ABNORMAL HIGH (ref 98–111)
Creatinine, Ser: 0.82 mg/dL (ref 0.61–1.24)
GFR, Estimated: >60 mL/min (ref >60)
Glucose, Bld: 98 mg/dL (ref 70–99)
Potassium: 3.3 mmol/L — ABNORMAL LOW (ref 3.5–5.1)
Sodium: 140 mmol/L (ref 135–145)

## 2020-04-01 LAB — CBC
HCT: 23.9 % — ABNORMAL LOW (ref 39.0–52.0)
Hemoglobin: 7.8 g/dL — ABNORMAL LOW (ref 13.0–17.0)
MCH: 28.9 pg (ref 26.0–34.0)
MCHC: 32.6 g/dL (ref 30.0–36.0)
MCV: 88.5 fL (ref 80.0–100.0)
Platelets: 90 10*3/uL — ABNORMAL LOW (ref 150–400)
RBC: 2.7 MIL/uL — ABNORMAL LOW (ref 4.22–5.81)
RDW: 13.9 % (ref 11.5–15.5)
WBC: 6.9 10*3/uL (ref 4.0–10.5)
nRBC: 0 % (ref 0.0–0.2)

## 2020-04-01 LAB — PREPARE RBC (CROSSMATCH)

## 2020-04-01 LAB — TYPE AND SCREEN
ABO/RH(D): O POS
Antibody Screen: NEGATIVE

## 2020-04-01 LAB — HEMOGLOBIN AND HEMATOCRIT, BLOOD
HCT: 27.3 % — ABNORMAL LOW (ref 39.0–52.0)
Hemoglobin: 9.4 g/dL — ABNORMAL LOW (ref 13.0–17.0)

## 2020-04-01 LAB — GLUCOSE, CAPILLARY
Glucose-Capillary: 94 mg/dL (ref 70–99)
Glucose-Capillary: 84 mg/dL (ref 70–99)
Glucose-Capillary: 84 mg/dL (ref 70–99)
Glucose-Capillary: 97 mg/dL (ref 70–99)
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
## 2020-04-01 IMAGING — RF DG C-ARM 1-60 MIN
1 series · 10 of 10 positions shown · non-contrast
Comparison: CT and radiographs [DATE]

CLINICAL DATA: Open reduction internal fixation of a left
acetabular fracture

EXAM:
OPERATIVE LEFT HIP (WITH PELVIS IF PERFORMED) 10 VIEWS
TECHNIQUE: Fluoroscopic spot image(s) were submitted for interpretation
post-operatively.
Fluoroscopy Time:  1 minutes, 27 seconds
Exposure index: 10.2 mGy
Number of Acquired Images:  10

[Series 1: run · 10 of 10 slices shown]
[im 1/10]
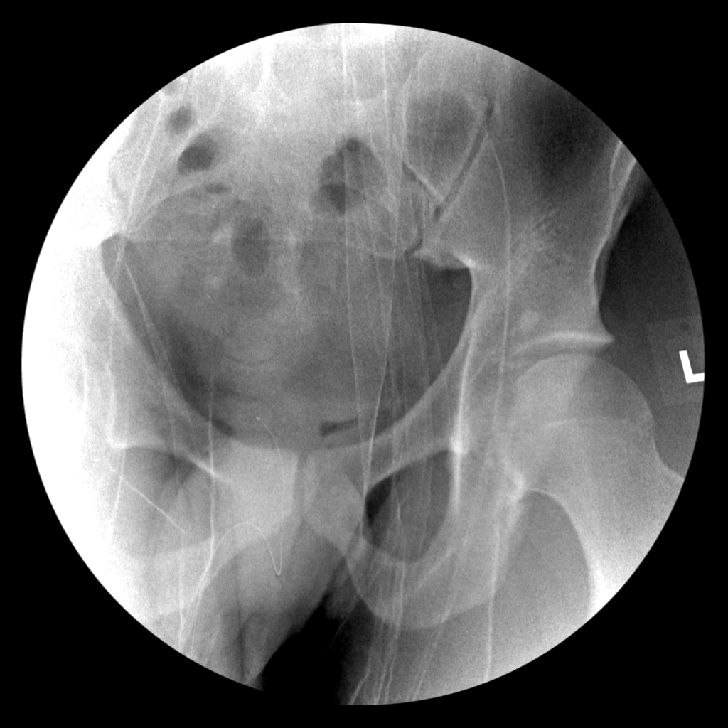
[im 2/10]
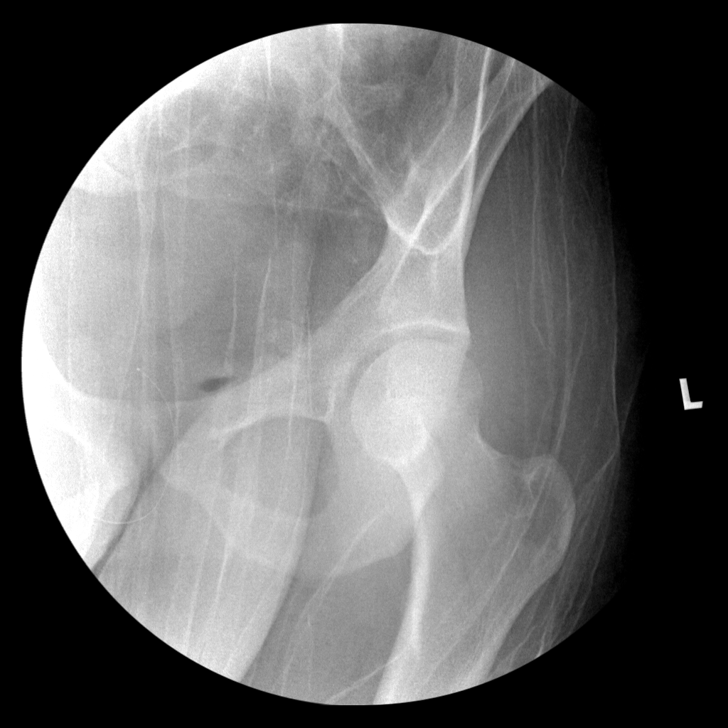
[im 3/10]
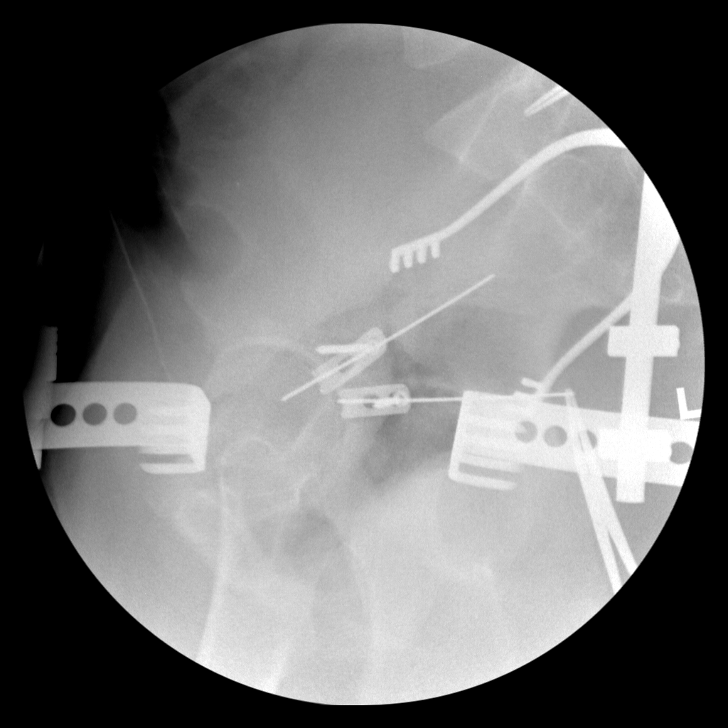
[im 4/10]
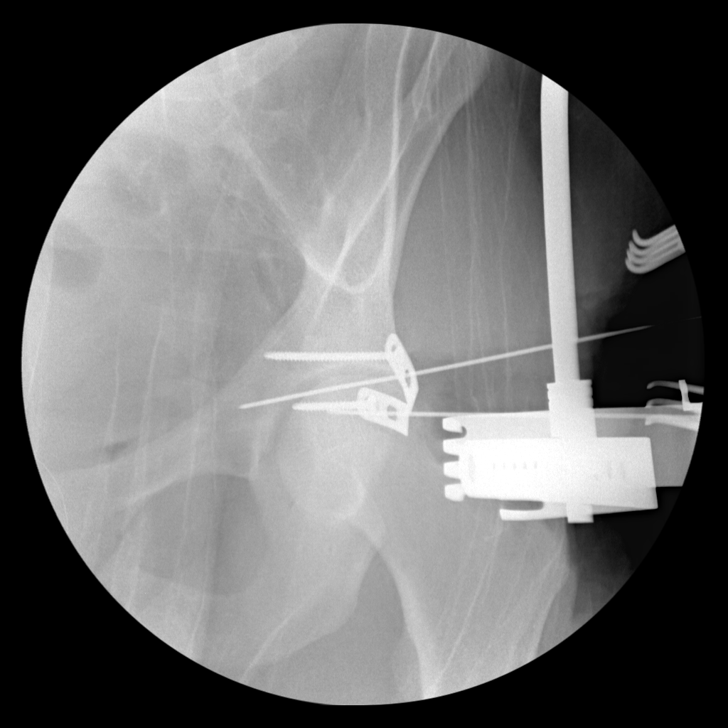
[im 5/10]
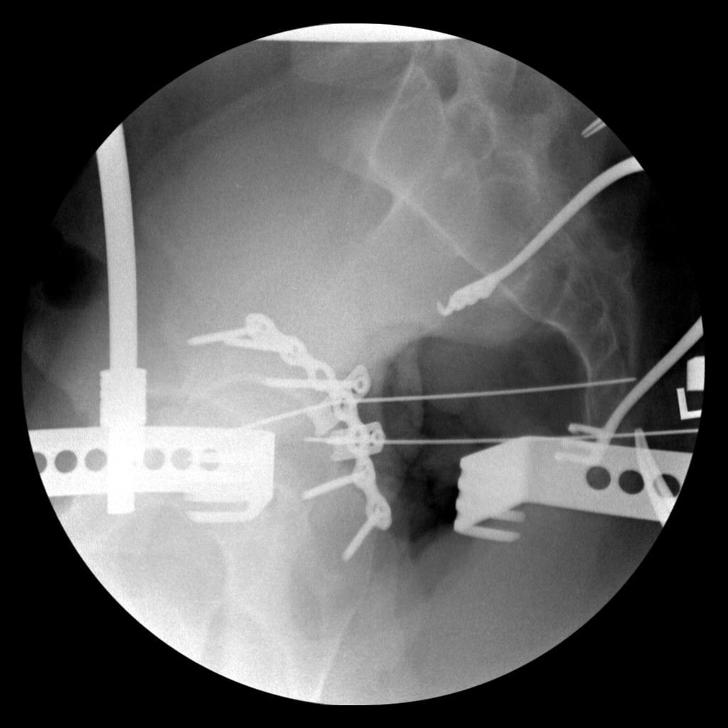
[im 6/10]
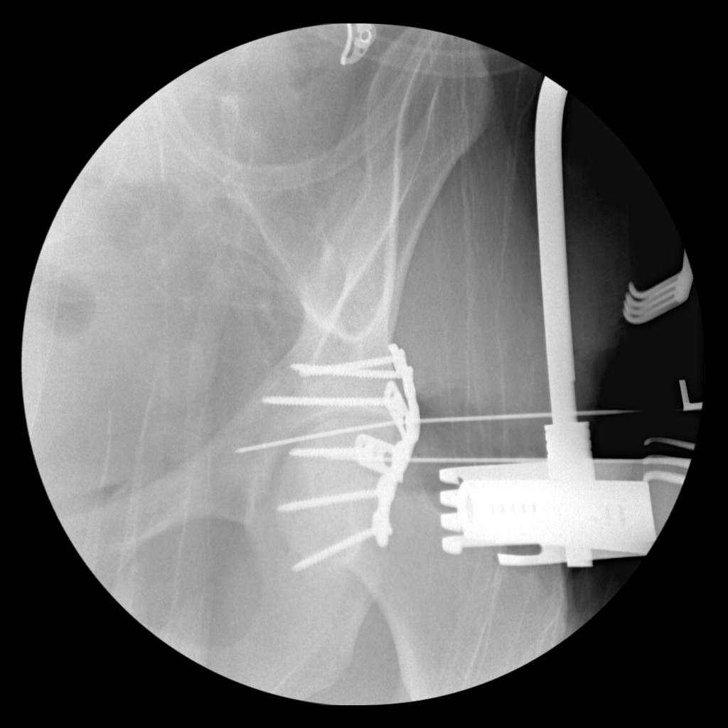
[im 7/10]
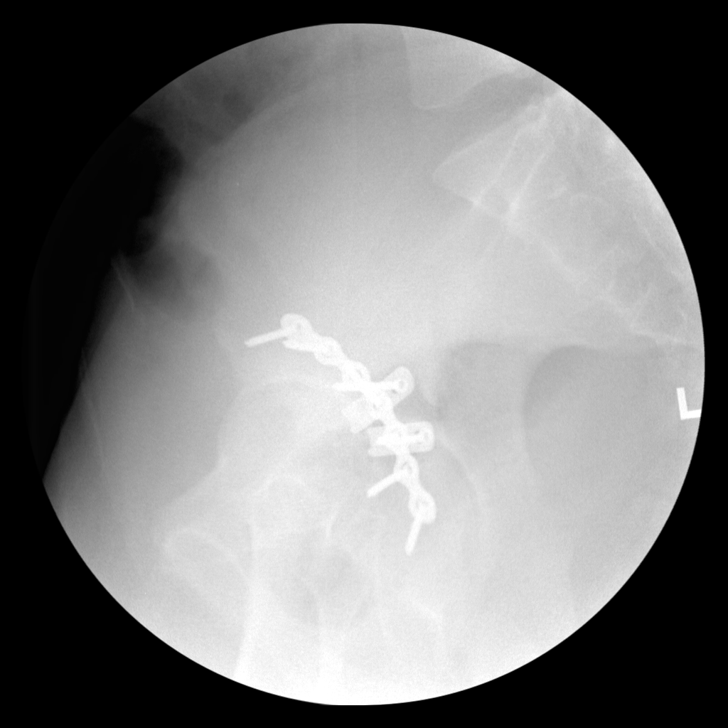
[im 8/10]
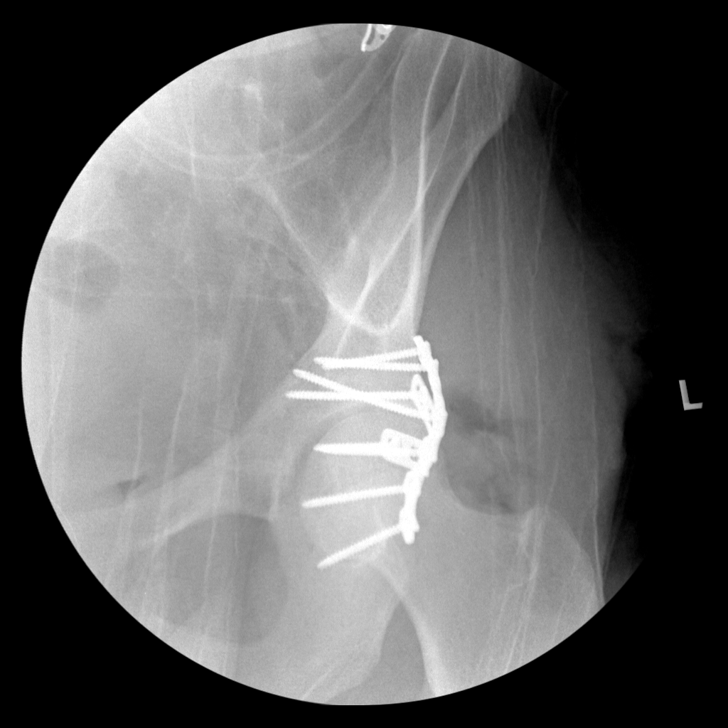
[im 9/10]
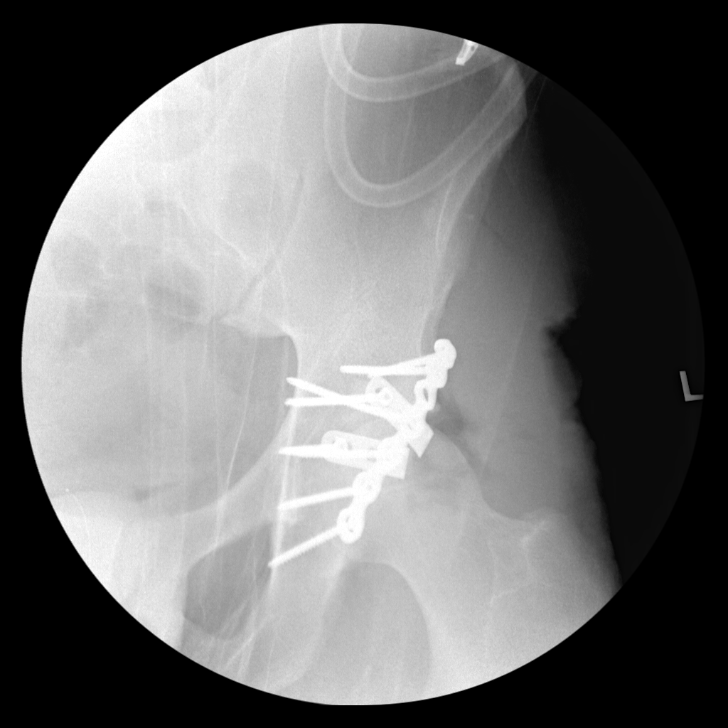
[im 10/10]
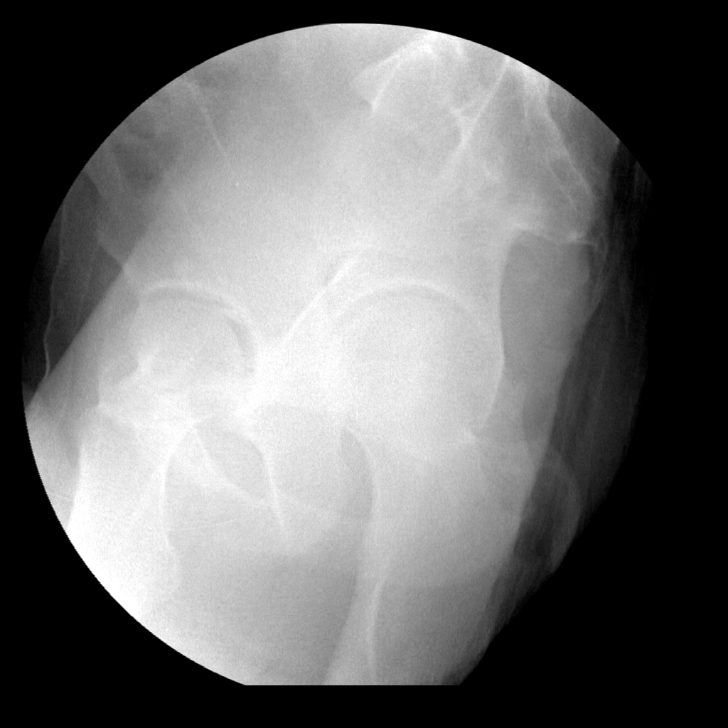

[10 of 10 positions shown; findings below may reference images not displayed]

FINDINGS: Multiple sequential fluoroscopic images were obtained during the
open reduction, internal fixation of the posterior left acetabulum.
Initial images redemonstrate the irregular mineralization along the
superior acetabular margin with normally located femoral heads.
Foley catheter in place. Subsequent images demonstrate placement of
a plate and screw fixation construct along the posterosuperior
acetabular margins without acute complication.
IMPRESSION: Intraoperative fluoroscopy during ORIF of a left acetabular
fracture. No acute complication is evident. Correlate with operative
report.

## 2020-04-01 IMAGING — RF DG HIP (WITH PELVIS) OPERATIVE*L*
1 series · 10 of 10 positions shown · non-contrast
Comparison: CT and radiographs [DATE]

CLINICAL DATA: Open reduction internal fixation of a left
acetabular fracture

EXAM:
OPERATIVE LEFT HIP (WITH PELVIS IF PERFORMED) 10 VIEWS
TECHNIQUE: Fluoroscopic spot image(s) were submitted for interpretation
post-operatively.
Fluoroscopy Time:  1 minutes, 27 seconds
Exposure index: 10.2 mGy
Number of Acquired Images:  10

[Series 1: run · 10 of 10 slices shown]
[im 1/10]
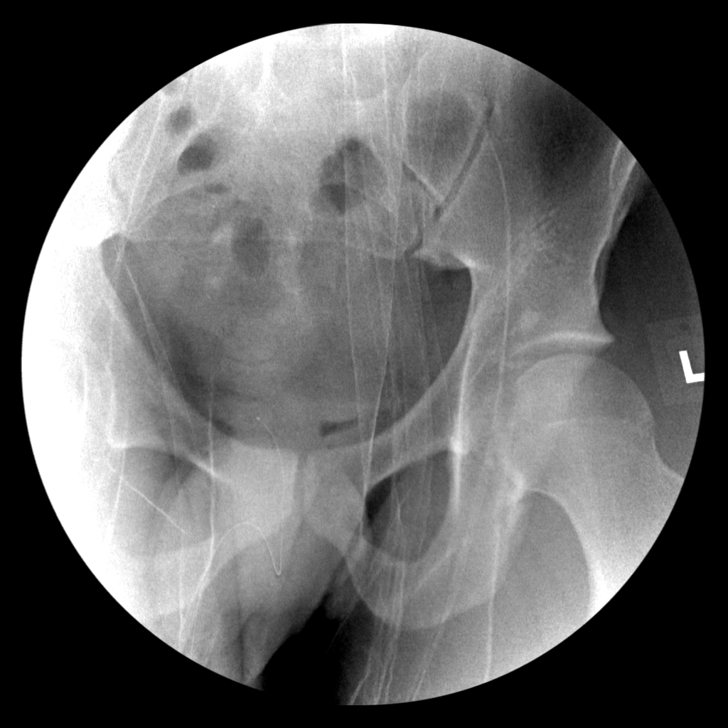
[im 2/10]
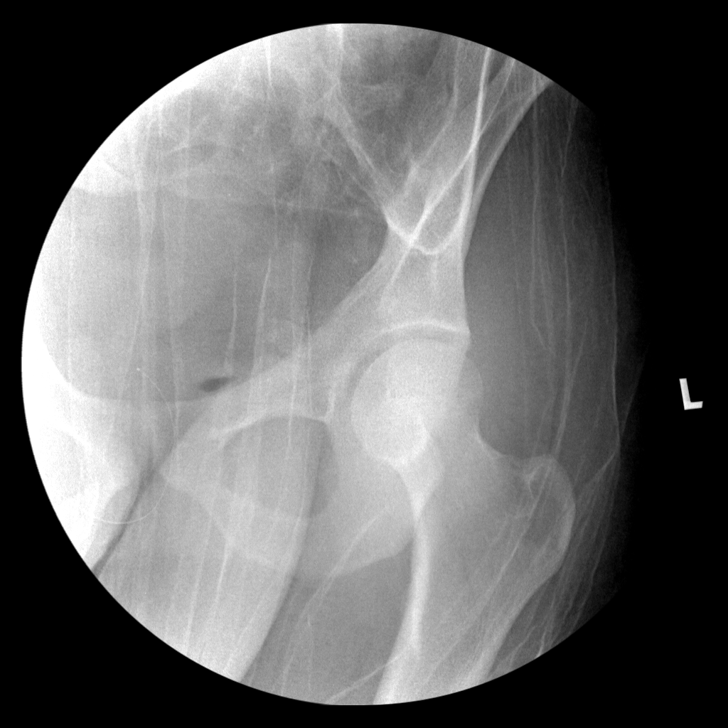
[im 3/10]
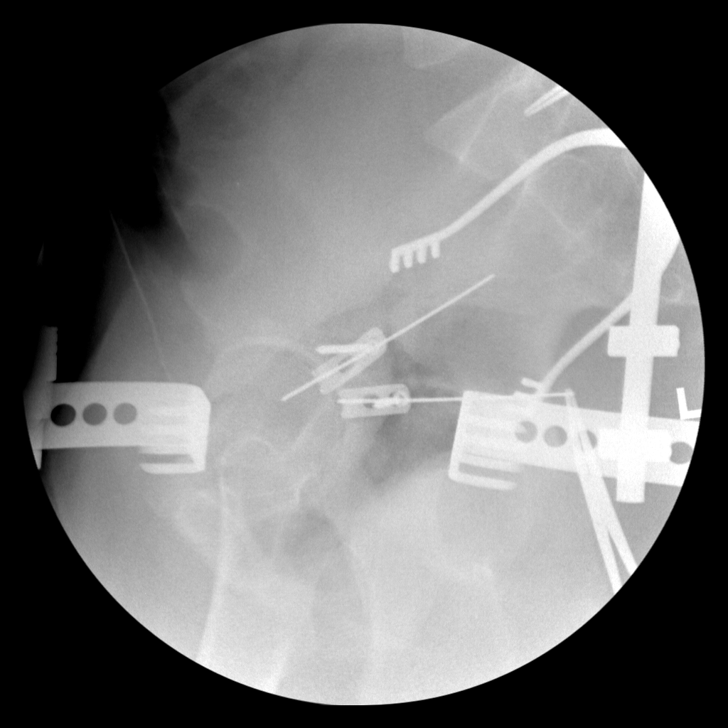
[im 4/10]
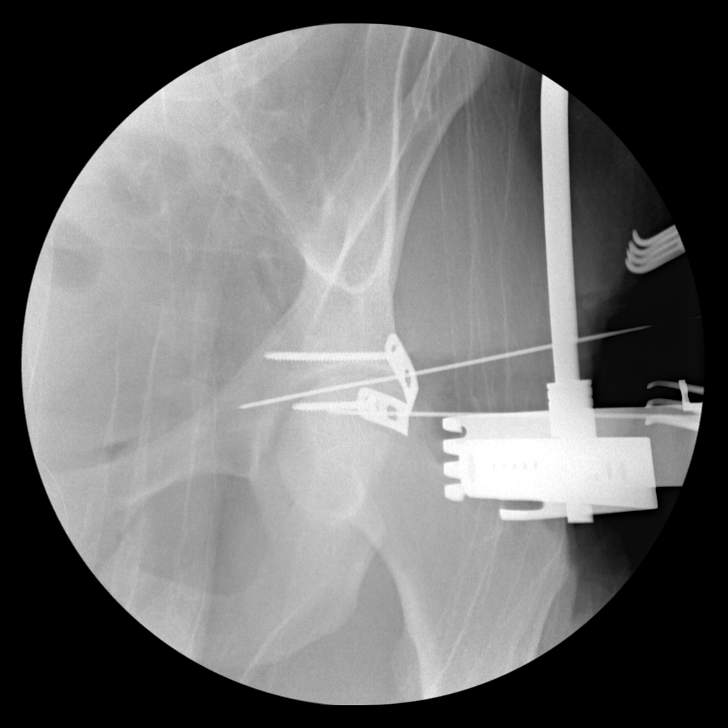
[im 5/10]
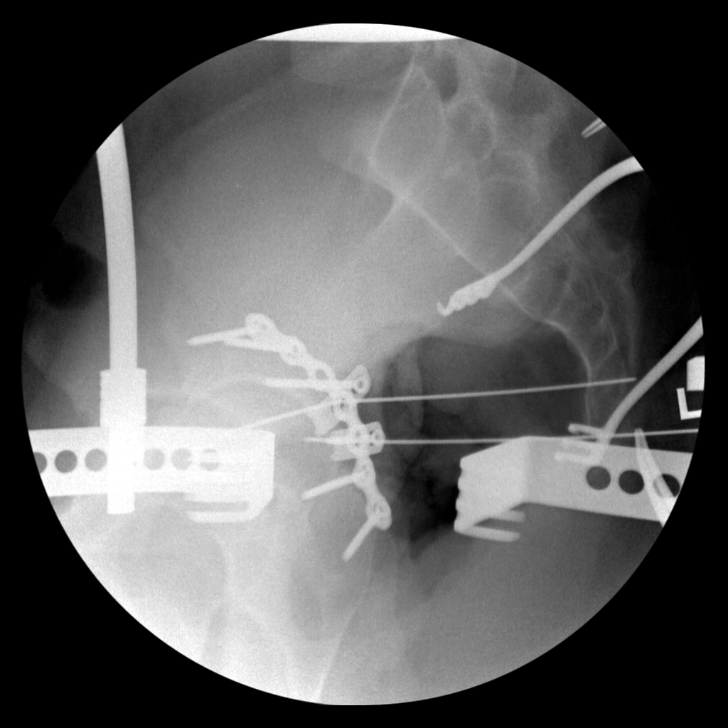
[im 6/10]
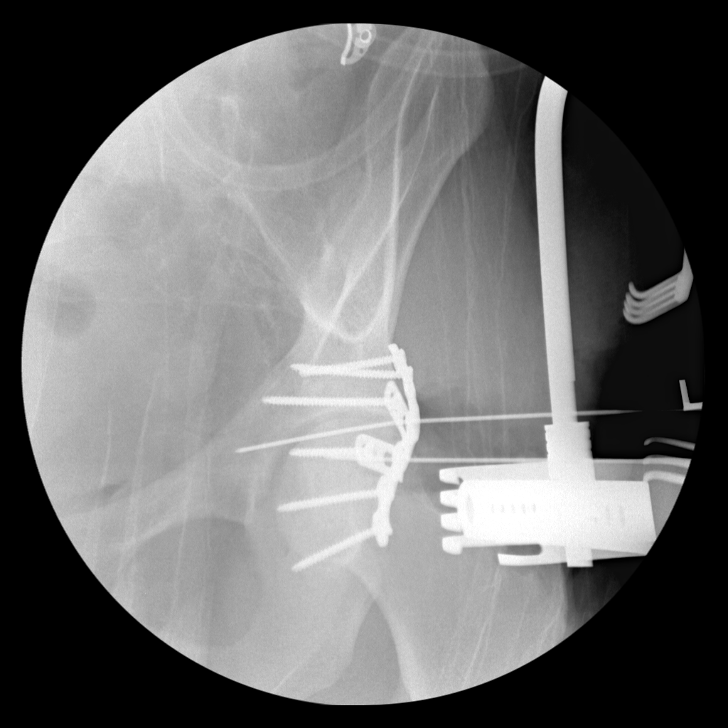
[im 7/10]
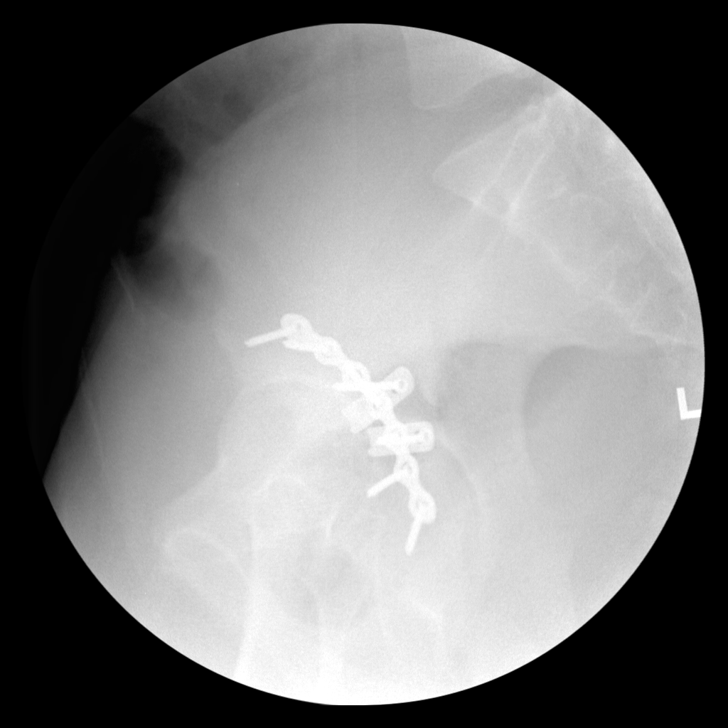
[im 8/10]
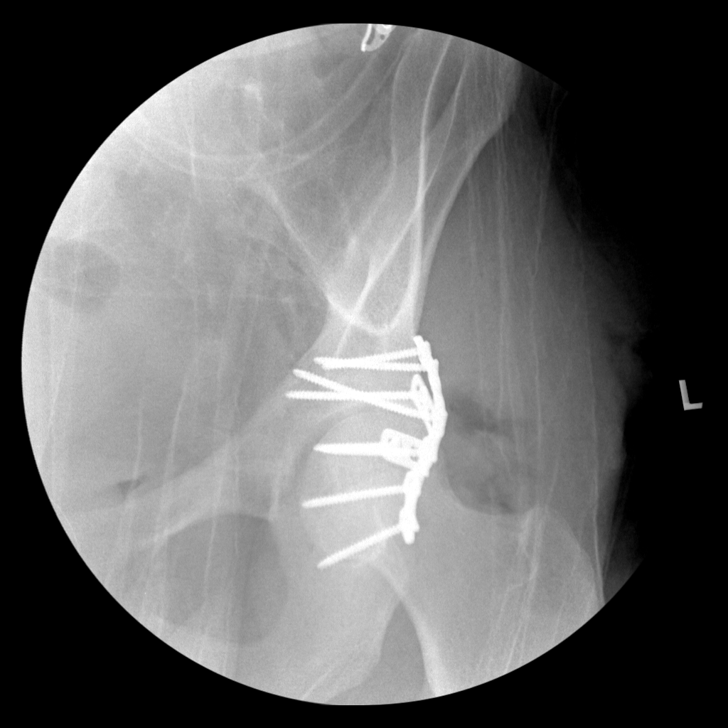
[im 9/10]
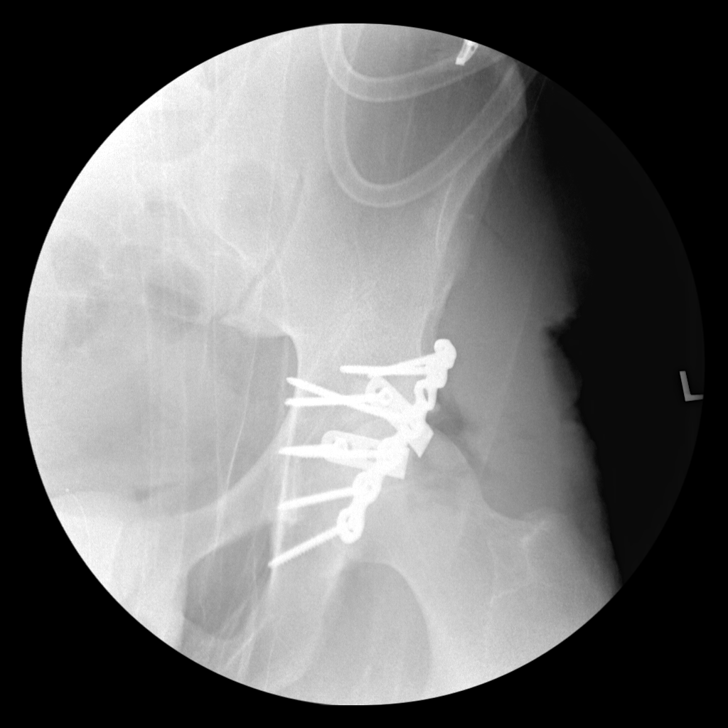
[im 10/10]
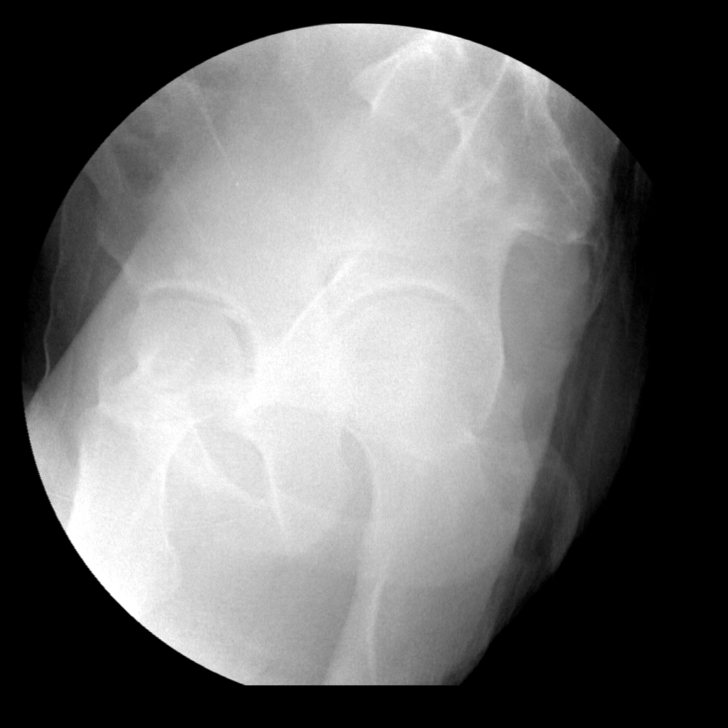

[10 of 10 positions shown; findings below may reference images not displayed]

FINDINGS: Multiple sequential fluoroscopic images were obtained during the
open reduction, internal fixation of the posterior left acetabulum.
Initial images redemonstrate the irregular mineralization along the
superior acetabular margin with normally located femoral heads.
Foley catheter in place. Subsequent images demonstrate placement of
a plate and screw fixation construct along the posterosuperior
acetabular margins without acute complication.
IMPRESSION: Intraoperative fluoroscopy during ORIF of a left acetabular
fracture. No acute complication is evident. Correlate with operative
report.
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
## 2020-04-01 IMAGING — DX DG PELVIS 3+V JUDET
3 series · 3 of 3 positions shown · non-contrast
Comparison: Radiographs [DATE], CT [DATE]

CLINICAL DATA: Pain, prior ORIF

EXAM:
JUDET PELVIS - 3+ VIEW

[pelvis ap]
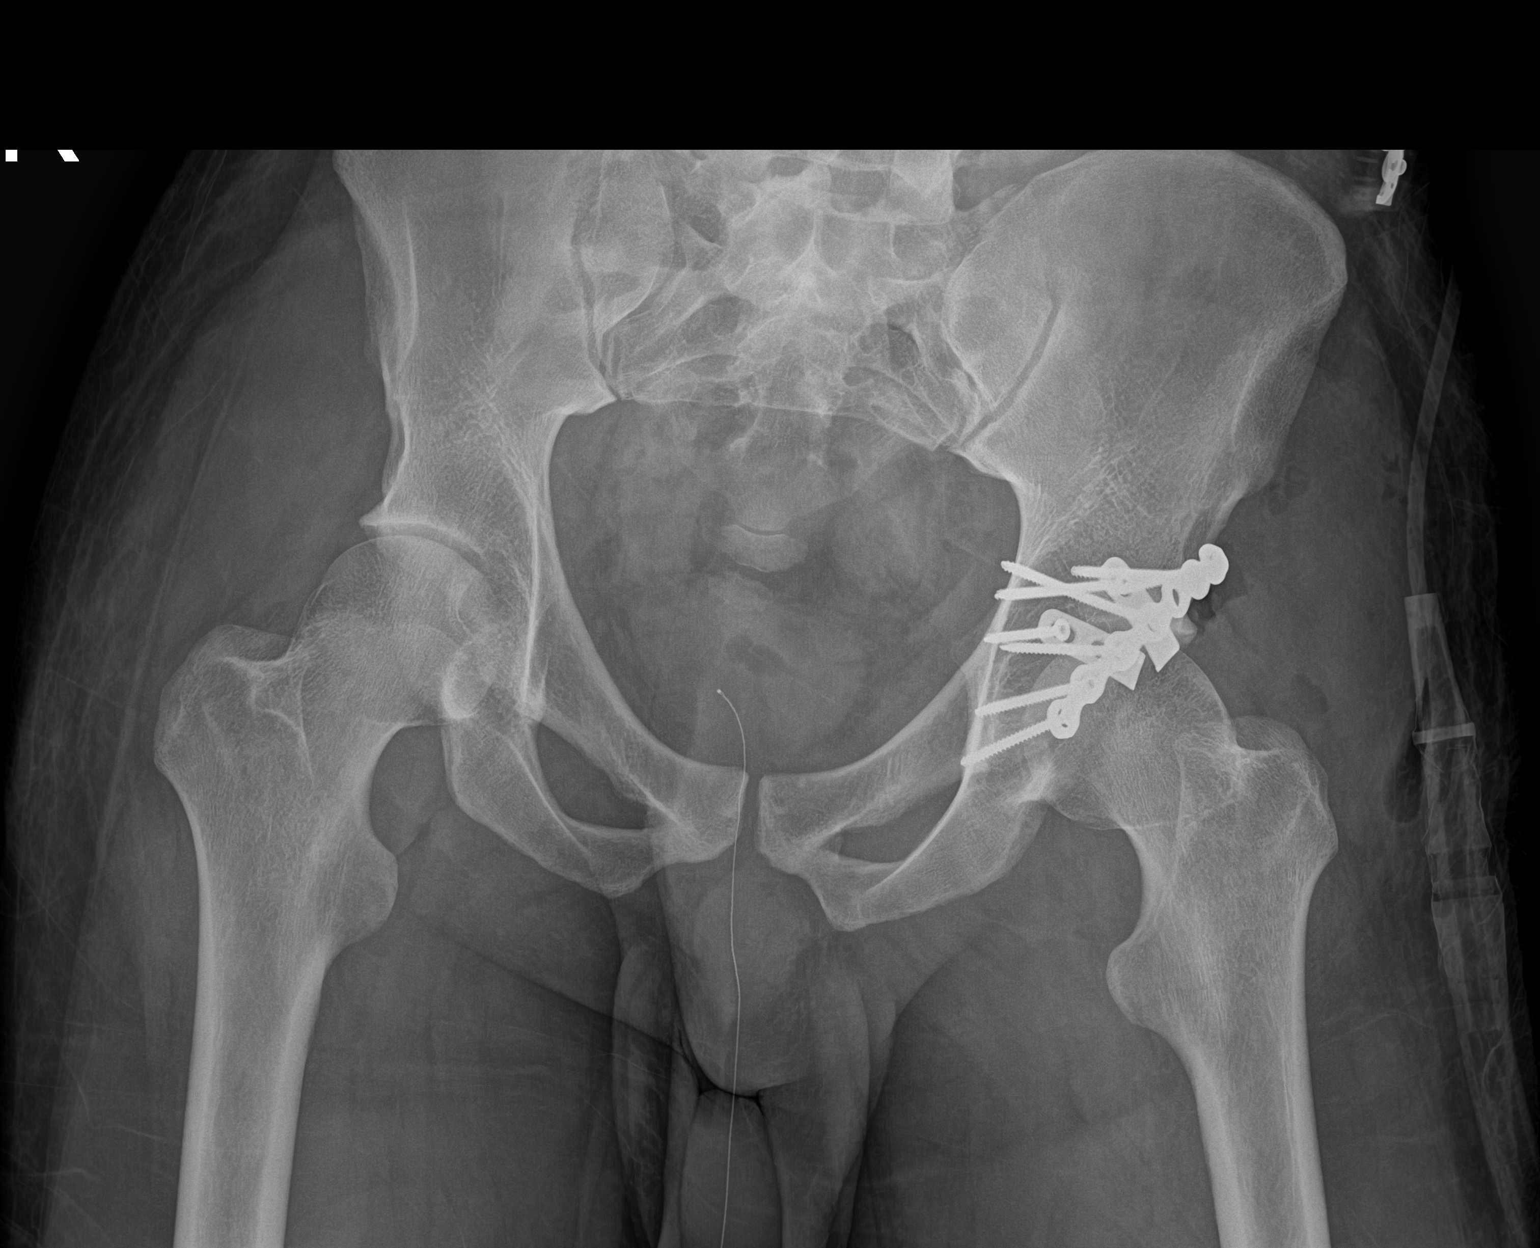

[pelvis obl (1 of 2)]
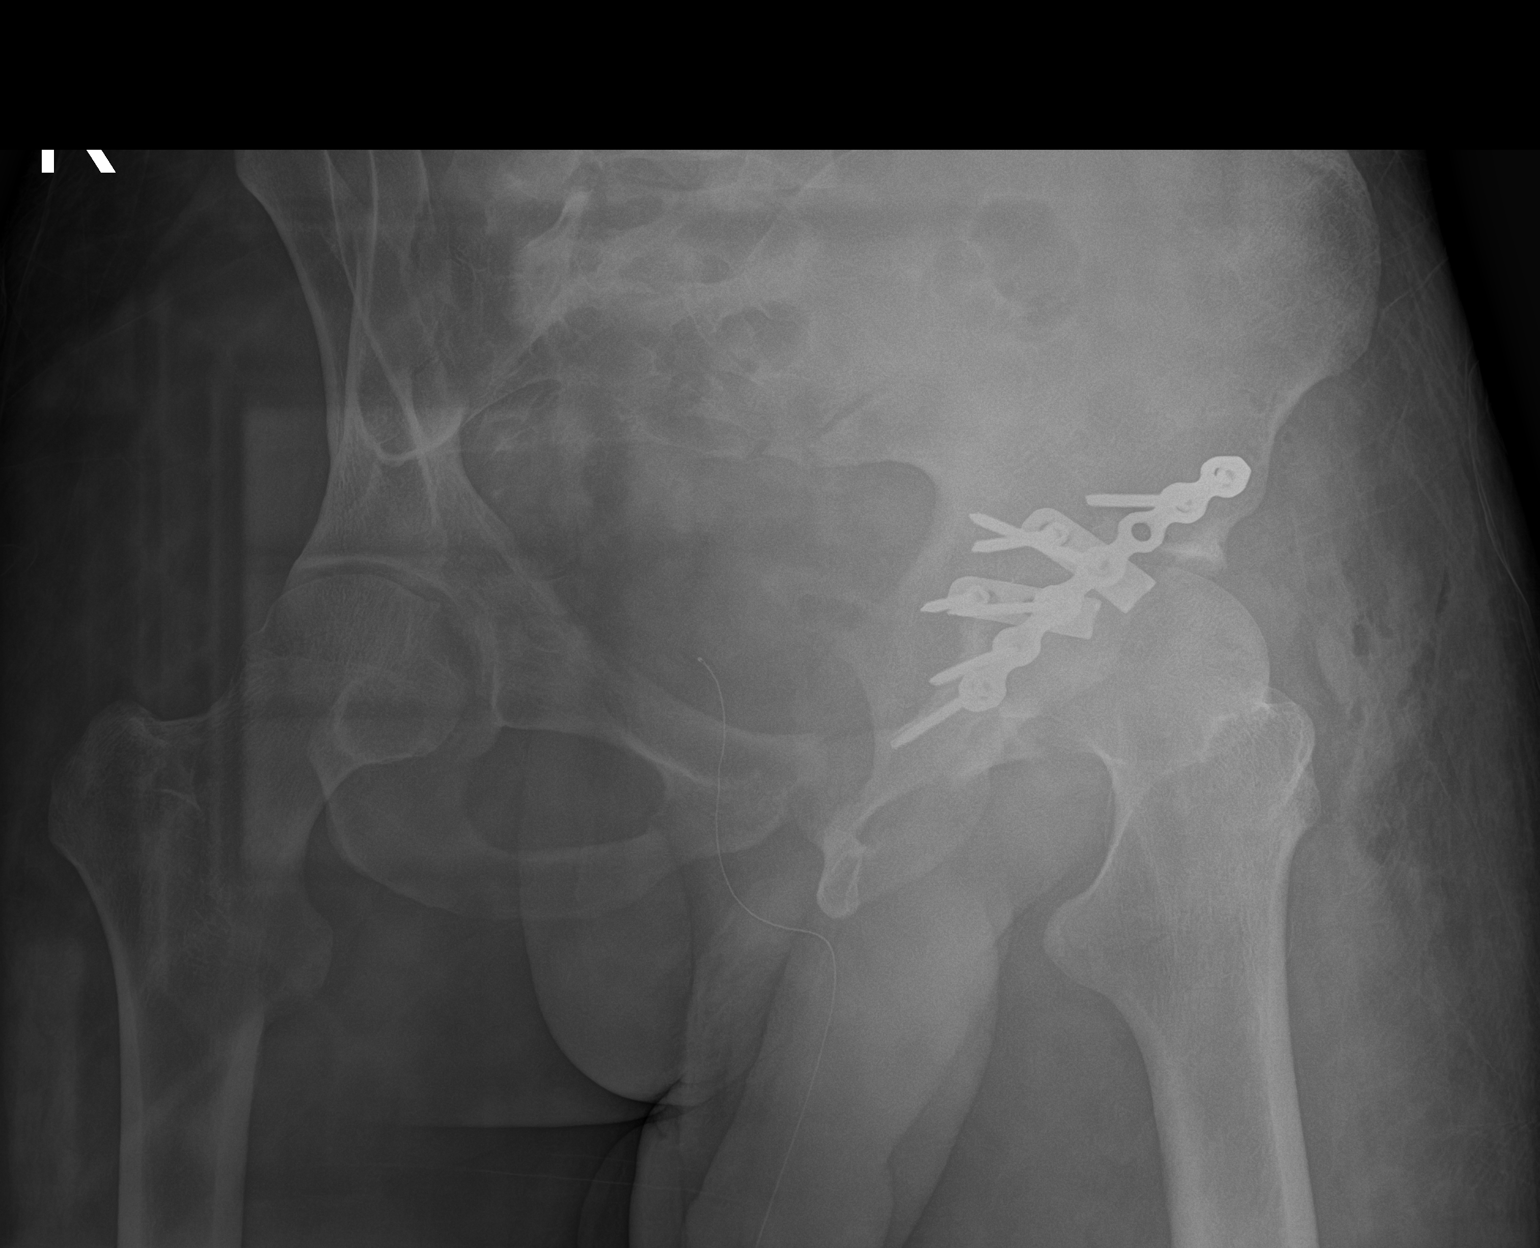

[pelvis obl (2 of 2)]
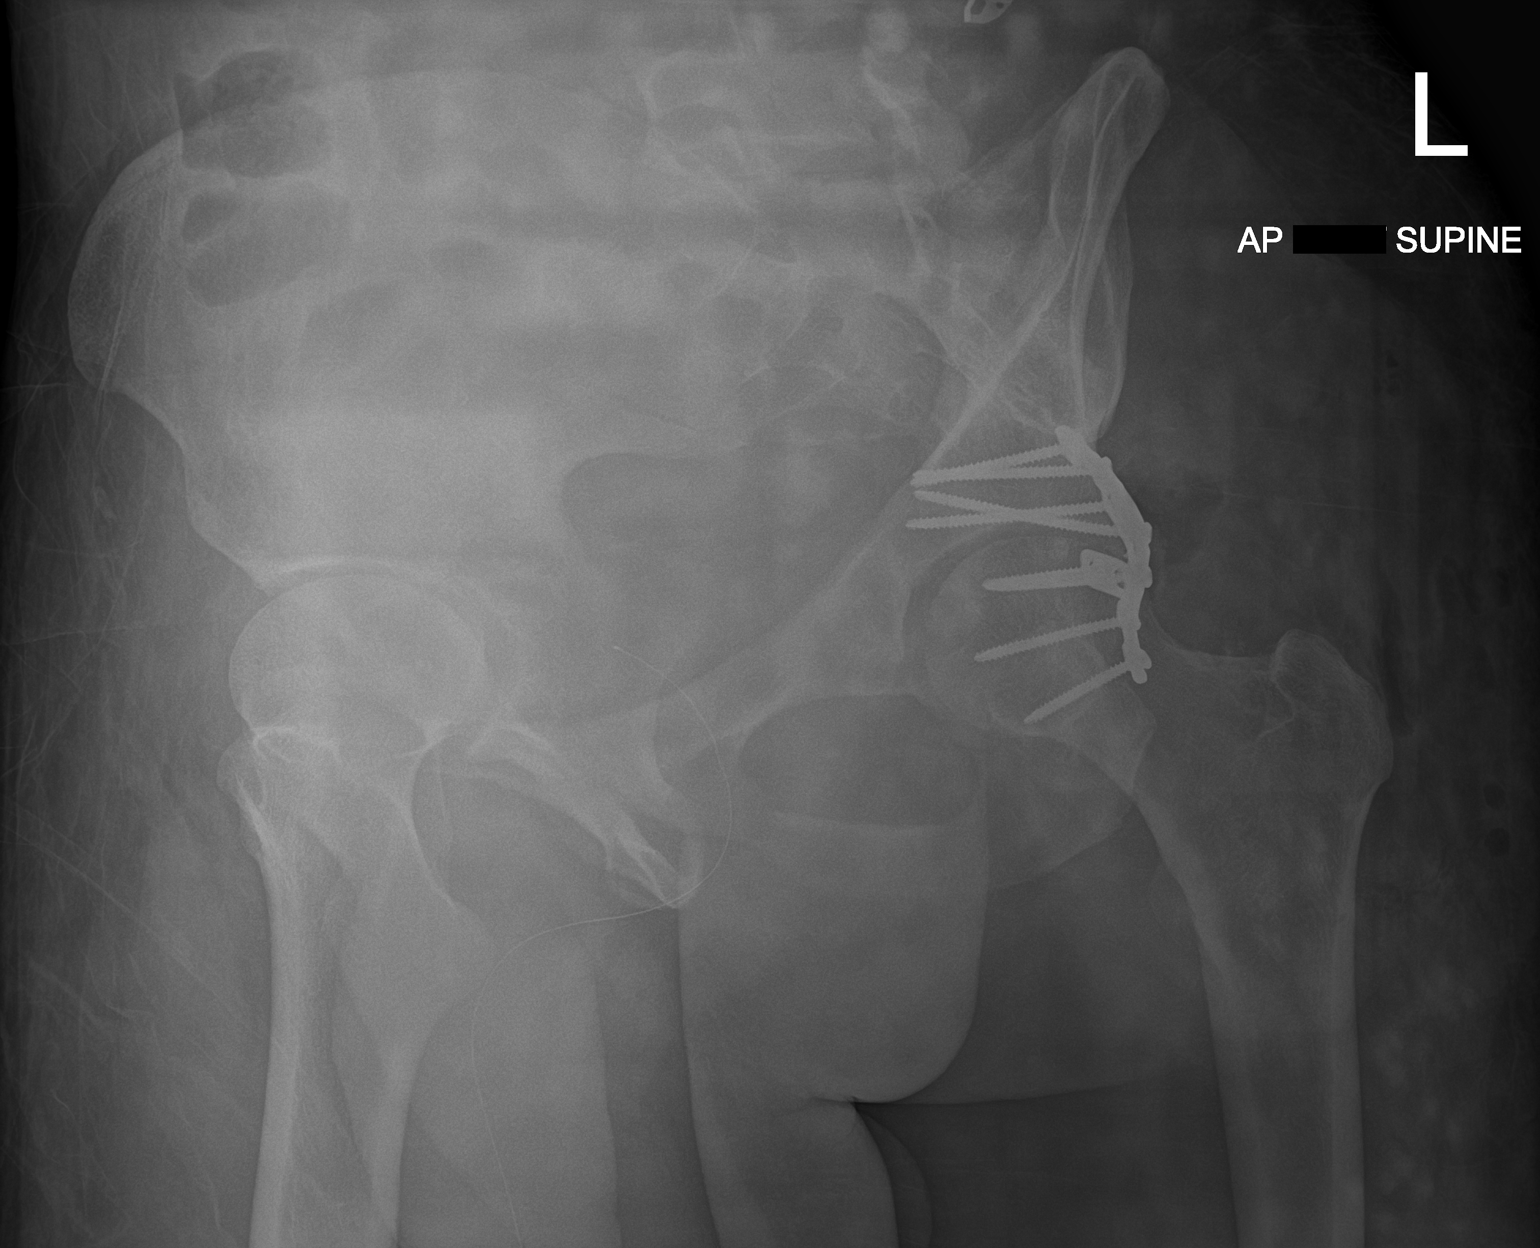

[3 of 3 positions shown; findings below may reference images not displayed]

FINDINGS: Postsurgical changes from ORIF of the previously described left
acetabular fracture. No acute hardware complication or failure.
Expected postsurgical soft tissue changes including swelling, soft
tissue and intra-articular gas at the left hip. No other acute or
conspicuous osseous abnormalities are identified.
IMPRESSION: Postsurgical changes from ORIF of the previously described left
acetabular fracture. No acute hardware complication or failure.

## 2020-04-01 SURGERY — OPEN REDUCTION INTERNAL FIXATION ACETABULUM POSTERIOR LATERAL
Anesthesia: General | Site: Pelvis | Laterality: Left

## 2020-04-01 MED ORDER — VANCOMYCIN HCL 1000 MG IV SOLR
INTRAVENOUS | Status: AC
  Filled 2020-04-01: qty 1000

## 2020-04-01 MED ORDER — VANCOMYCIN HCL 1000 MG IV SOLR
INTRAVENOUS | Status: DC | PRN
  Administered 2020-04-01: 2000 mg via TOPICAL
  Administered 2020-04-01: 1000 mg via TOPICAL

## 2020-04-01 MED ORDER — ACETAMINOPHEN 10 MG/ML IV SOLN
INTRAVENOUS | Status: AC
  Filled 2020-04-01: qty 100

## 2020-04-01 MED ORDER — ROCURONIUM BROMIDE 10 MG/ML (PF) SYRINGE
PREFILLED_SYRINGE | INTRAVENOUS | Status: DC | PRN
  Administered 2020-04-01: 30 mg via INTRAVENOUS
  Administered 2020-04-01: 40 mg via INTRAVENOUS
  Administered 2020-04-01 (×2): 30 mg via INTRAVENOUS
  Administered 2020-04-01: 50 mg via INTRAVENOUS
  Administered 2020-04-01 (×2): 30 mg via INTRAVENOUS
  Administered 2020-04-01: 40 mg via INTRAVENOUS
  Administered 2020-04-01: 50 mg via INTRAVENOUS

## 2020-04-01 MED ORDER — ROCURONIUM BROMIDE 10 MG/ML (PF) SYRINGE
PREFILLED_SYRINGE | INTRAVENOUS | Status: AC
  Filled 2020-04-01: qty 10

## 2020-04-01 MED ORDER — SODIUM CHLORIDE 0.9 % IV SOLN
2.0000 g | Freq: Three times a day (TID) | INTRAVENOUS | Status: DC
  Administered 2020-04-01 – 2020-04-03 (×5): 2 g via INTRAVENOUS
  Filled 2020-04-01 (×5): qty 2

## 2020-04-01 MED ORDER — ACETAMINOPHEN 10 MG/ML IV SOLN
INTRAVENOUS | Status: DC | PRN
  Administered 2020-04-01: 1000 mg via INTRAVENOUS

## 2020-04-01 MED ORDER — FENTANYL CITRATE (PF) 100 MCG/2ML IJ SOLN
INTRAMUSCULAR | Status: DC | PRN
  Administered 2020-04-01 (×2): 100 ug via INTRAVENOUS
  Administered 2020-04-01: 50 ug via INTRAVENOUS

## 2020-04-01 MED ORDER — PHENYLEPHRINE 40 MCG/ML (10ML) SYRINGE FOR IV PUSH (FOR BLOOD PRESSURE SUPPORT)
PREFILLED_SYRINGE | INTRAVENOUS | Status: DC | PRN
  Administered 2020-04-01: 120 ug via INTRAVENOUS
  Administered 2020-04-01 (×6): 80 ug via INTRAVENOUS
  Administered 2020-04-01: 40 ug via INTRAVENOUS
  Administered 2020-04-01 (×2): 80 ug via INTRAVENOUS

## 2020-04-01 MED ORDER — SODIUM CHLORIDE 0.9% IV SOLUTION: Freq: Once | INTRAVENOUS | Status: DC

## 2020-04-01 MED ORDER — PHENYLEPHRINE 40 MCG/ML (10ML) SYRINGE FOR IV PUSH (FOR BLOOD PRESSURE SUPPORT)
PREFILLED_SYRINGE | INTRAVENOUS | Status: AC
  Filled 2020-04-01: qty 20

## 2020-04-01 MED ORDER — METHYLENE BLUE 0.5 % INJ SOLN
INTRAVENOUS | Status: AC
  Filled 2020-04-01: qty 10

## 2020-04-01 MED ORDER — ALBUTEROL SULFATE HFA 108 (90 BASE) MCG/ACT IN AERS
INHALATION_SPRAY | RESPIRATORY_TRACT | Status: DC | PRN
  Administered 2020-04-01: 6 via RESPIRATORY_TRACT

## 2020-04-01 MED ORDER — ROCURONIUM BROMIDE 10 MG/ML (PF) SYRINGE
PREFILLED_SYRINGE | INTRAVENOUS | Status: AC
  Filled 2020-04-01: qty 20

## 2020-04-01 MED ORDER — TOBRAMYCIN SULFATE 1.2 G IJ SOLR
INTRAMUSCULAR | Status: AC
  Filled 2020-04-01: qty 1.2

## 2020-04-01 MED ORDER — EPHEDRINE SULFATE-NACL 50-0.9 MG/10ML-% IV SOSY
PREFILLED_SYRINGE | INTRAVENOUS | Status: DC | PRN
  Administered 2020-04-01 (×2): 10 mg via INTRAVENOUS
  Administered 2020-04-01: 15 mg via INTRAVENOUS

## 2020-04-01 MED ORDER — POTASSIUM CHLORIDE 10 MEQ/50ML IV SOLN
10.0000 meq | INTRAVENOUS | Status: AC
  Administered 2020-04-01 (×2): 10 meq via INTRAVENOUS
  Filled 2020-04-01 (×2): qty 50

## 2020-04-01 MED ORDER — EPHEDRINE 5 MG/ML INJ
INTRAVENOUS | Status: AC
  Filled 2020-04-01: qty 10

## 2020-04-01 MED ORDER — DEXMEDETOMIDINE HCL IN NACL 400 MCG/100ML IV SOLN
0.4000 ug/kg/h | INTRAVENOUS | Status: DC
  Filled 2020-04-01: qty 100

## 2020-04-01 MED ORDER — 0.9 % SODIUM CHLORIDE (POUR BTL) OPTIME
TOPICAL | Status: DC | PRN
  Administered 2020-04-01: 1000 mL

## 2020-04-01 MED ORDER — VANCOMYCIN HCL 1000 MG IV SOLR
INTRAVENOUS | Status: AC
  Filled 2020-04-01: qty 2000

## 2020-04-01 MED ORDER — TOBRAMYCIN SULFATE 1.2 G IJ SOLR
INTRAMUSCULAR | Status: AC
  Filled 2020-04-01: qty 2.4

## 2020-04-01 MED ORDER — LACTATED RINGERS IV SOLN: INTRAVENOUS | Status: DC | PRN

## 2020-04-01 MED ORDER — TOBRAMYCIN SULFATE 1.2 G IJ SOLR
INTRAMUSCULAR | Status: DC | PRN
  Administered 2020-04-01: 2.4 g via TOPICAL

## 2020-04-01 MED ORDER — SODIUM CHLORIDE 0.9 % IV SOLN: INTRAVENOUS | Status: DC | PRN

## 2020-04-01 MED ORDER — SODIUM CHLORIDE 0.9 % IV SOLN: 2.0000 g | Freq: Two times a day (BID) | INTRAVENOUS | Status: DC

## 2020-04-01 MED ORDER — DEXMEDETOMIDINE HCL IN NACL 200 MCG/50ML IV SOLN
INTRAVENOUS | Status: AC
  Filled 2020-04-01: qty 50

## 2020-04-01 MED ORDER — PROPOFOL 10 MG/ML IV BOLUS
INTRAVENOUS | Status: AC
  Filled 2020-04-01: qty 20

## 2020-04-01 MED ORDER — FENTANYL CITRATE (PF) 250 MCG/5ML IJ SOLN
INTRAMUSCULAR | Status: AC
  Filled 2020-04-01: qty 5

## 2020-04-01 MED ORDER — ALBUMIN HUMAN 5 % IV SOLN: INTRAVENOUS | Status: DC | PRN

## 2020-04-01 MED ORDER — PHENYLEPHRINE HCL-NACL 10-0.9 MG/250ML-% IV SOLN
INTRAVENOUS | Status: DC | PRN
  Administered 2020-04-01: 25 ug/min via INTRAVENOUS

## 2020-04-01 SURGICAL SUPPLY — 145 items
APL PRP STRL LF DISP 70% ISPRP (MISCELLANEOUS) ×8
APL SKNCLS STERI-STRIP NONHPOA (GAUZE/BANDAGES/DRESSINGS)
BENZOIN TINCTURE PRP APPL 2/3 (GAUZE/BANDAGES/DRESSINGS) IMPLANT
BIT DRILL 2.5 X LONG (BIT) ×2
BIT DRILL 2.8 (BIT) ×2
BIT DRILL CALIBR QC 2.5X250 (BIT) ×1 IMPLANT
BIT DRILL CANN QC 2.8X165 (BIT) IMPLANT
BIT DRILL LCP QC 2X140 (BIT) ×2 IMPLANT
BIT DRILL X LONG 2.5 (BIT) IMPLANT
BLADE AVERAGE 25X9 (BLADE) IMPLANT
BLADE CLIPPER SURG (BLADE) ×2 IMPLANT
BLADE SURG 10 STRL SS (BLADE) IMPLANT
BNDG CMPR 9X4 STRL LF SNTH (GAUZE/BANDAGES/DRESSINGS)
BNDG COHESIVE 4X5 TAN STRL (GAUZE/BANDAGES/DRESSINGS) ×3 IMPLANT
BNDG ELASTIC 3X5.8 VLCR STR LF (GAUZE/BANDAGES/DRESSINGS) ×3 IMPLANT
BNDG ELASTIC 4X5.8 VLCR STR LF (GAUZE/BANDAGES/DRESSINGS) ×3 IMPLANT
BNDG ELASTIC 6X5.8 VLCR STR LF (GAUZE/BANDAGES/DRESSINGS) ×2 IMPLANT
BNDG ESMARK 4X9 LF (GAUZE/BANDAGES/DRESSINGS) IMPLANT
BNDG GAUZE ELAST 4 BULKY (GAUZE/BANDAGES/DRESSINGS) ×2 IMPLANT
BOWL SMART MIX CTS (DISPOSABLE) ×1 IMPLANT
CEMENT BONE R 1X40 (Cement) ×1 IMPLANT
CHLORAPREP W/TINT 26 (MISCELLANEOUS) ×6 IMPLANT
CLEANER TIP ELECTROSURG 2X2 (MISCELLANEOUS) ×3 IMPLANT
COVER SURGICAL LIGHT HANDLE (MISCELLANEOUS) ×6 IMPLANT
CUFF TOURN SGL QUICK 18X4 (TOURNIQUET CUFF) IMPLANT
CUFF TOURN SGL QUICK 24 (TOURNIQUET CUFF)
CUFF TRNQT CYL 24X4X16.5-23 (TOURNIQUET CUFF) IMPLANT
DECANTER SPIKE VIAL GLASS SM (MISCELLANEOUS) IMPLANT
DRAPE C-ARM 42X72 X-RAY (DRAPES) ×4 IMPLANT
DRAPE C-ARMOR (DRAPES) ×4 IMPLANT
DRAPE IMP U-DRAPE 54X76 (DRAPES) ×1 IMPLANT
DRAPE INCISE IOBAN 66X45 STRL (DRAPES) ×2 IMPLANT
DRAPE INCISE IOBAN 85X60 (DRAPES) ×1 IMPLANT
DRAPE ORTHO SPLIT 77X108 STRL (DRAPES) ×12
DRAPE SURG ORHT 6 SPLT 77X108 (DRAPES) ×4 IMPLANT
DRAPE U-SHAPE 47X51 STRL (DRAPES) ×3 IMPLANT
DRILL BIT 2.8MM (BIT) ×3
DRILL BIT X LONG 2.5 (BIT) ×3
DRSG ADAPTIC 3X8 NADH LF (GAUZE/BANDAGES/DRESSINGS) IMPLANT
DRSG MEPILEX BORDER 4X12 (GAUZE/BANDAGES/DRESSINGS) ×1 IMPLANT
DRSG PAD ABDOMINAL 8X10 ST (GAUZE/BANDAGES/DRESSINGS) ×2 IMPLANT
ELECT REM PT RETURN 9FT ADLT (ELECTROSURGICAL) ×3
ELECTRODE REM PT RTRN 9FT ADLT (ELECTROSURGICAL) ×2 IMPLANT
GAUZE SPONGE 4X4 12PLY STRL (GAUZE/BANDAGES/DRESSINGS) ×4 IMPLANT
GAUZE XEROFORM 5X9 LF (GAUZE/BANDAGES/DRESSINGS) ×2 IMPLANT
GLOVE BIO SURGEON STRL SZ 6.5 (GLOVE) ×9 IMPLANT
GLOVE BIO SURGEON STRL SZ7.5 (GLOVE) ×12 IMPLANT
GLOVE BIOGEL PI IND STRL 7.5 (GLOVE) ×2 IMPLANT
GLOVE BIOGEL PI INDICATOR 7.5 (GLOVE) ×2
GLOVE SURG UNDER POLY LF SZ6.5 (GLOVE) ×4 IMPLANT
GOWN STRL REUS W/ TWL LRG LVL3 (GOWN DISPOSABLE) ×4 IMPLANT
GOWN STRL REUS W/TWL LRG LVL3 (GOWN DISPOSABLE) ×18
HANDPIECE INTERPULSE COAX TIP (DISPOSABLE) ×3
K-WIRE 1.6X150 (WIRE) ×3
KIT BASIN OR (CUSTOM PROCEDURE TRAY) ×3 IMPLANT
KIT TURNOVER KIT B (KITS) ×3 IMPLANT
KWIRE 1.6X150 (WIRE) IMPLANT
LOOP VESSEL MAXI BLUE (MISCELLANEOUS) ×1 IMPLANT
MANIFOLD NEPTUNE II (INSTRUMENTS) ×3 IMPLANT
NDL FILTER BLUNT 18X1 1/2 (NEEDLE) IMPLANT
NEEDLE FILTER BLUNT 18X 1/2SAF (NEEDLE) ×1
NEEDLE FILTER BLUNT 18X1 1/2 (NEEDLE) ×2 IMPLANT
NS IRRIG 1000ML POUR BTL (IV SOLUTION) ×3 IMPLANT
PACK ORTHO EXTREMITY (CUSTOM PROCEDURE TRAY) ×3 IMPLANT
PACK UNIVERSAL I (CUSTOM PROCEDURE TRAY) ×1 IMPLANT
PAD ARMBOARD 7.5X6 YLW CONV (MISCELLANEOUS) ×6 IMPLANT
PAD CAST 4YDX4 CTTN HI CHSV (CAST SUPPLIES) ×2 IMPLANT
PADDING CAST COTTON 4X4 STRL (CAST SUPPLIES)
PLATE BONE 91MM 7HOLE PELVIC (Plate) ×1 IMPLANT
PLATE DIST HUM LCP 3.5X230 10H (Plate) ×1 IMPLANT
PLATE DIST HUM VA 2.7X199 11H (Plate) ×1 IMPLANT
PLATE LOCK VA-LCP F2.7X142 6H (Plate) ×1 IMPLANT
PLATE SPRING 2H 3.5MM PELVIC (Plate) ×2 IMPLANT
RETRIEVER SUT HEWSON (MISCELLANEOUS) ×1 IMPLANT
SCREW CORTEX 3.5 16MM (Screw) ×2 IMPLANT
SCREW CORTEX 3.5 18MM (Screw) ×2 IMPLANT
SCREW CORTEX 3.5 22MM (Screw) ×1 IMPLANT
SCREW CORTEX 3.5 24MM (Screw) ×1 IMPLANT
SCREW CORTEX 3.5 26MM (Screw) ×2 IMPLANT
SCREW CORTEX 3.5 28MM (Screw) ×3 IMPLANT
SCREW CORTEX 3.5 36MM (Screw) ×2 IMPLANT
SCREW CORTEX 3.5 38MM (Screw) ×1 IMPLANT
SCREW CORTEX 3.5 40MM (Screw) ×1 IMPLANT
SCREW CORTEX 3.5X45MM (Screw) ×1 IMPLANT
SCREW LOCK CORT ST 3.5X16 (Screw) IMPLANT
SCREW LOCK CORT ST 3.5X18 (Screw) IMPLANT
SCREW LOCK CORT ST 3.5X22 (Screw) IMPLANT
SCREW LOCK CORT ST 3.5X24 (Screw) IMPLANT
SCREW LOCK CORT ST 3.5X26 (Screw) IMPLANT
SCREW LOCK CORT ST 3.5X28 (Screw) IMPLANT
SCREW LOCK CORT ST 3.5X36 (Screw) IMPLANT
SCREW LOCK CORT ST 3.5X38 (Screw) IMPLANT
SCREW LOCK CORT ST 3.5X40 (Screw) IMPLANT
SCREW LOCK T15 FT 24X3.5X2.9X (Screw) IMPLANT
SCREW LOCK T15 FT 28X3.5X2.9X (Screw) IMPLANT
SCREW LOCK T8 22X2.7XST VA (Screw) IMPLANT
SCREW LOCK T8 24X2.7XSTVA (Screw) IMPLANT
SCREW LOCK VA ST 2.7X14 (Screw) ×1 IMPLANT
SCREW LOCK VA ST 2.7X20 (Screw) ×1 IMPLANT
SCREW LOCK VA ST 2.7X26 (Screw) ×2 IMPLANT
SCREW LOCKING 2.7X16MM VA (Screw) ×1 IMPLANT
SCREW LOCKING 2.7X22MM (Screw) ×6 IMPLANT
SCREW LOCKING 2.7X24MM (Screw) ×9 IMPLANT
SCREW LOCKING 3.5X24 (Screw) ×3 IMPLANT
SCREW LOCKING 3.5X26 (Screw) ×2 IMPLANT
SCREW LOCKING 3.5X28 (Screw) ×3 IMPLANT
SCREW LOCKING VA 2.7X40MM (Screw) ×2 IMPLANT
SCREW LOCKING VA 2.7X42 (Screw) ×1 IMPLANT
SCREW METAPHYSCAL 34MM (Screw) ×1 IMPLANT
SCREW METAPHYSCAL 46MM (Screw) ×1 IMPLANT
SCREW METAPHYSEAL 2.7X42MM (Screw) ×1 IMPLANT
SCREW SELF-TAP 2.7X26MM (Screw) ×1 IMPLANT
SET HNDPC FAN SPRY TIP SCT (DISPOSABLE) IMPLANT
SLEEVE SURGEON STRL (DRAPES) ×1 IMPLANT
SPONGE LAP 18X18 RF (DISPOSABLE) ×9 IMPLANT
STAPLER VISISTAT 35W (STAPLE) ×3 IMPLANT
STRIP CLOSURE SKIN 1/2X4 (GAUZE/BANDAGES/DRESSINGS) IMPLANT
SUCTION FRAZIER HANDLE 10FR (MISCELLANEOUS) ×3
SUCTION FRAZIER TIP 8 FR DISP (SUCTIONS) ×3
SUCTION TUBE FRAZIER 10FR DISP (MISCELLANEOUS) ×2 IMPLANT
SUCTION TUBE FRAZIER 8FR DISP (SUCTIONS) IMPLANT
SUT ETHILON 3 0 PS 1 (SUTURE) ×5 IMPLANT
SUT FIBERWIRE #2 38 REV NDL BL (SUTURE) ×6
SUT MNCRL AB 3-0 PS2 18 (SUTURE) ×2 IMPLANT
SUT MNCRL AB 3-0 PS2 27 (SUTURE) ×1 IMPLANT
SUT MON AB 2-0 CT1 36 (SUTURE) ×6 IMPLANT
SUT PDS AB 0 CT1 27 (SUTURE) ×2 IMPLANT
SUT PDS AB 2-0 CT1 27 (SUTURE) IMPLANT
SUT PROLENE 0 CT (SUTURE) IMPLANT
SUT PROLENE 3 0 PS 2 (SUTURE) ×4 IMPLANT
SUT VIC AB 0 CT1 27 (SUTURE) ×15
SUT VIC AB 0 CT1 27XBRD ANBCTR (SUTURE) ×4 IMPLANT
SUT VIC AB 0 CT1 36 (SUTURE) ×1 IMPLANT
SUT VIC AB 2-0 CT1 27 (SUTURE) ×6
SUT VIC AB 2-0 CT1 TAPERPNT 27 (SUTURE) ×4 IMPLANT
SUT VIC AB 2-0 CT3 27 (SUTURE) IMPLANT
SUTURE FIBERWR#2 38 REV NDL BL (SUTURE) IMPLANT
SYR 3ML LL SCALE MARK (SYRINGE) ×1 IMPLANT
SYR CONTROL 10ML LL (SYRINGE) ×3 IMPLANT
TOWEL GREEN STERILE (TOWEL DISPOSABLE) ×6 IMPLANT
TOWEL GREEN STERILE FF (TOWEL DISPOSABLE) IMPLANT
TUBE CONNECTING 12X1/4 (SUCTIONS) ×4 IMPLANT
UNDERPAD 30X36 HEAVY ABSORB (UNDERPADS AND DIAPERS) ×4 IMPLANT
WATER STERILE IRR 1000ML POUR (IV SOLUTION) ×3 IMPLANT
YANKAUER SUCT BULB TIP NO VENT (SUCTIONS) ×4 IMPLANT

## 2020-04-01 NOTE — Anesthesia Postprocedure Evaluation (Signed)
Anesthesia Post Note  Patient: [name]  Procedure(s) Performed: OPEN REDUCTION INTERNAL FIXATION ACETABULUM POSTERIOR LATERAL (Left Pelvis) OPEN REDUCTION INTERNAL FIXATION (ORIF) ELBOW/OLECRANON FRACTURE (Left Elbow)     Patient location during evaluation: PACU Anesthesia Type: General Level of consciousness: awake and alert Pain management: pain level controlled Vital Signs Assessment: post-procedure vital signs reviewed and stable Respiratory status: spontaneous breathing, nonlabored ventilation, respiratory function stable and patient connected to nasal cannula oxygen Cardiovascular status: blood pressure returned to baseline and stable Postop Assessment: no apparent nausea or vomiting Anesthetic complications: no   No complications documented.  Last Vitals:  Vitals:   04/01/20 1900 04/01/20 2007  BP:  113/79  Pulse: 81 82  Resp: (!) 24 (!) 25  Temp: (!) 35.8 C (!) 36.2 C  SpO2: 100% 100%    Last Pain:  Vitals:   04/01/20 1026  TempSrc: Core                 Jeremy George
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
## 2020-04-01 NOTE — Transfer of Care (Signed)
Immediate Anesthesia Transfer of Care Note  Patient: Jeremy George  Procedure(s) Performed: OPEN REDUCTION INTERNAL FIXATION ACETABULUM POSTERIOR LATERAL (Left Pelvis) OPEN REDUCTION INTERNAL FIXATION (ORIF) ELBOW/OLECRANON FRACTURE (Left Elbow)  Patient Location: ICU  Anesthesia Type:General  Level of Consciousness: Patient remains intubated per anesthesia plan  Airway & Oxygen Therapy: Patient remains intubated per anesthesia plan and Patient placed on Ventilator (see vital sign flow sheet for setting)  Post-op Assessment: Report given to RN  Post vital signs: Reviewed and stable  Last Vitals:  Vitals Value Taken Time  BP 121/88 04/01/20 1744  Temp 35.7 C 04/01/20 1745  Pulse 85 04/01/20 1745  Resp 16 04/01/20 1745  SpO2 100 % 04/01/20 1745  Vitals shown include unvalidated device data.  Last Pain:  Vitals:   04/01/20 1026  TempSrc: Core         Complications: No complications documented.
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
## 2020-04-01 NOTE — Progress Notes (Signed)
Pt left for the OR at 1130 and returned at 1730.  Restraint was re applied to his right hand once he was back in his room.

## 2020-04-01 NOTE — Interval H&P Note (Signed)
We will plan to perform open reduction internal fixation of his left acetabulum and likely open reduction internal fixation of his left elbow. Risks and benefits were discussed with the patient's mother at bedside. She agrees to proceed with surgery and consent was obtained. If we have time we will potentially fix his clavicle today as well depending on the time it takes to fix his acetabulum and elbow. The patient has been febrile however his blood cultures have remained negative. It potentially is from his head bleed. There is also potential coming from a pulmonary source. He has been on ceftriaxone for open fracture prophylaxis. His incisional wound VAC is put out no output. We will continue to follow this but I do not feel that delaying the surgery is warranted. I do not feel that he is bacteremic as his white blood cell count is within normal limits.  Roby Lofts, MD [facility] [phone] (office) [email or web address]

## 2020-04-02 ENCOUNTER — Inpatient Hospital Stay (HOSPITAL_COMMUNITY): Payer: Medicaid Other

## 2020-04-02 ENCOUNTER — Encounter (HOSPITAL_COMMUNITY): Payer: Self-pay | Admitting: Student

## 2020-04-02 LAB — CBC
HCT: 26.3 % — ABNORMAL LOW (ref 39.0–52.0)
Hemoglobin: 8.7 g/dL — ABNORMAL LOW (ref 13.0–17.0)
MCH: 29.3 pg (ref 26.0–34.0)
MCHC: 33.1 g/dL (ref 30.0–36.0)
MCV: 88.6 fL (ref 80.0–100.0)
Platelets: 102 10*3/uL — ABNORMAL LOW (ref 150–400)
RBC: 2.97 MIL/uL — ABNORMAL LOW (ref 4.22–5.81)
RDW: 14.2 % (ref 11.5–15.5)
WBC: 5.1 10*3/uL (ref 4.0–10.5)
nRBC: 0 % (ref 0.0–0.2)

## 2020-04-02 LAB — BASIC METABOLIC PANEL
Anion gap: 10 (ref 5–15)
BUN: 11 mg/dL (ref 6–20)
CO2: 19 mmol/L — ABNORMAL LOW (ref 22–32)
Calcium: 6.9 mg/dL — ABNORMAL LOW (ref 8.9–10.3)
Chloride: 110 mmol/L (ref 98–111)
Creatinine, Ser: 0.78 mg/dL (ref 0.61–1.24)
GFR, Estimated: >60 mL/min (ref >60)
Glucose, Bld: 110 mg/dL — ABNORMAL HIGH (ref 70–99)
Potassium: 3.7 mmol/L (ref 3.5–5.1)
Sodium: 139 mmol/L (ref 135–145)

## 2020-04-02 LAB — GLUCOSE, CAPILLARY
Glucose-Capillary: 116 mg/dL — ABNORMAL HIGH (ref 70–99)
Glucose-Capillary: 123 mg/dL — ABNORMAL HIGH (ref 70–99)
Glucose-Capillary: 127 mg/dL — ABNORMAL HIGH (ref 70–99)
Glucose-Capillary: 145 mg/dL — ABNORMAL HIGH (ref 70–99)
Glucose-Capillary: 151 mg/dL — ABNORMAL HIGH (ref 70–99)
Glucose-Capillary: 96 mg/dL (ref 70–99)

## 2020-04-02 LAB — TRIGLYCERIDES: Triglycerides: 260 mg/dL — ABNORMAL HIGH (ref <150)

## 2020-04-02 IMAGING — DX DG CHEST 1V PORT
1 series · 1 of 1 positions shown · non-contrast
Comparison: [DATE]

CLINICAL DATA: Left pneumothorax.

EXAM:
PORTABLE CHEST 1 VIEW

[chest ap]
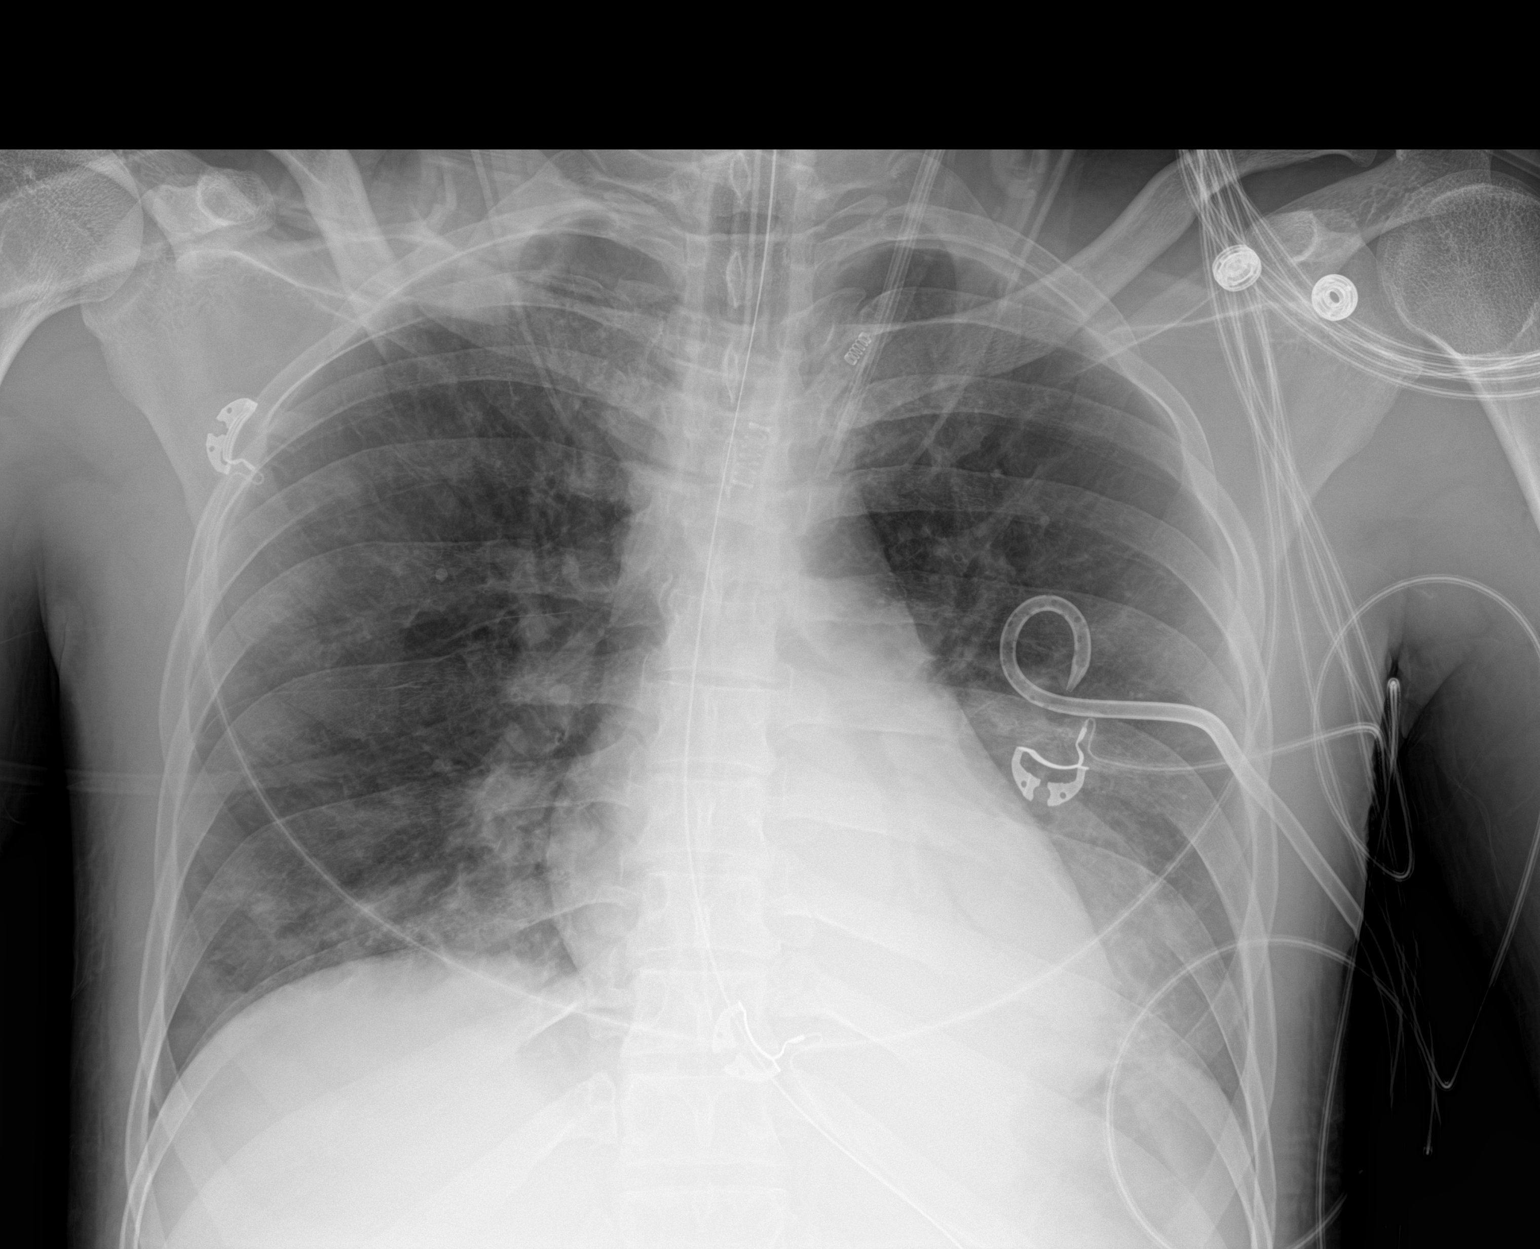

[1 of 1 positions shown; findings below may reference images not displayed]

FINDINGS: Pigtail chest tube on the left remains in place. No pneumothorax.
Left lower lobe consolidation with mild progression. Minimal left
effusion.

Endotracheal tube in good position. NG tube enters the stomach.
Right external jugular catheter tip in the region of the right
innominate vein. Left jugular central venous catheter tip in the
left innominate vein.

Progression of right lower lobe airspace disease.
IMPRESSION: Left chest tube in place.  No pneumothorax

Progression of bibasilar airspace disease left greater than right.

## 2020-04-02 MED ORDER — PIVOT 1.5 CAL PO LIQD
1000.0000 mL | ORAL | Status: DC
  Administered 2020-04-02 – 2020-04-21 (×19): 1000 mL
  Filled 2020-04-02: qty 1000

## 2020-04-02 MED ORDER — CLONAZEPAM 1 MG PO TABS
1.0000 mg | ORAL_TABLET | Freq: Two times a day (BID) | ORAL | Status: DC
  Administered 2020-04-02 – 2020-04-07 (×10): 1 mg
  Filled 2020-04-02 (×10): qty 1

## 2020-04-02 MED ORDER — ACETAMINOPHEN 160 MG/5ML PO SOLN
650.0000 mg | Freq: Four times a day (QID) | ORAL | Status: DC
  Administered 2020-04-02 – 2020-04-08 (×21): 650 mg
  Filled 2020-04-02 (×21): qty 20.3

## 2020-04-02 MED ORDER — CALCIUM GLUCONATE-NACL 2-0.675 GM/100ML-% IV SOLN
2.0000 g | Freq: Once | INTRAVENOUS | Status: AC
  Administered 2020-04-02: 2000 mg via INTRAVENOUS
  Filled 2020-04-02: qty 100

## 2020-04-02 MED ORDER — QUETIAPINE FUMARATE 100 MG PO TABS
100.0000 mg | ORAL_TABLET | Freq: Two times a day (BID) | ORAL | Status: DC
  Administered 2020-04-02 – 2020-04-07 (×10): 100 mg
  Filled 2020-04-02 (×10): qty 1

## 2020-04-02 NOTE — Progress Notes (Signed)
Orthopaedic Trauma Progress Note ° °SUBJECTIVE: Intubated and sedated. No acute events overnight.  ° °OBJECTIVE:  °Vitals:  ° 04/02/20 0800 04/02/20 0900  °BP: 115/63 (!) 112/57  °Pulse: 83 90  °Resp: (!) 24 (!) 24  °Temp: 99.5 °F (37.5 °C) 100.22 °F (37.9 °C)  °SpO2: 100% 97%  ° ° °General: Intubated and sedated °Respiratory: Mechanically ventilated, no increased work of breathing.  °LUE: Dressing CDI.  Compartments soft and compressible.  Unable to obtain reliable motor or sensory exam.  + Radial pulse °LLE: Dressings clean, dry, intact.  Unable to obtain reliable motor or sensory exam.  Compartments are soft and compressible.  +DP pulse ° °IMAGING: Stable post op imaging.  ° °LABS:  °Results for orders placed or performed during the hospital encounter of 03/29/20 (from the past 24 hour(s))  °I-STAT 7, (LYTES, BLD GAS, ICA, H+H)     Status: Abnormal  ° Collection Time: 04/01/20 11:36 AM  °Result Value Ref Range  ° pH, Arterial 7.216 (L) 7.350 - 7.450  ° pCO2 arterial 51.4 (H) 32.0 - 48.0 mmHg  ° pO2, Arterial 53 (L) 83.0 - 108.0 mmHg  ° Bicarbonate 20.5 20.0 - 28.0 mmol/L  ° TCO2 22 22 - 32 mmol/L  ° O2 Saturation 76.0 %  ° Acid-base deficit 7.0 (H) 0.0 - 2.0 mmol/L  ° Sodium 145 135 - 145 mmol/L  ° Potassium 3.6 3.5 - 5.1 mmol/L  ° Calcium, Ion 1.23 1.15 - 1.40 mmol/L  ° HCT 24.0 (L) 39.0 - 52.0 %  ° Hemoglobin 8.2 (L) 13.0 - 17.0 g/dL  ° Patient temperature 38.3 C   ° Sample type ARTERIAL   °I-STAT 7, (LYTES, BLD GAS, ICA, H+H)     Status: Abnormal  ° Collection Time: 04/01/20  1:22 PM  °Result Value Ref Range  ° pH, Arterial 7.173 (LL) 7.350 - 7.450  ° pCO2 arterial 59.5 (H) 32.0 - 48.0 mmHg  ° pO2, Arterial 83 83.0 - 108.0 mmHg  ° Bicarbonate 22.0 20.0 - 28.0 mmol/L  ° TCO2 24 22 - 32 mmol/L  ° O2 Saturation 93.0 %  ° Acid-base deficit 6.0 (H) 0.0 - 2.0 mmol/L  ° Sodium 143 135 - 145 mmol/L  ° Potassium 3.8 3.5 - 5.1 mmol/L  ° Calcium, Ion 1.19 1.15 - 1.40 mmol/L  ° HCT 23.0 (L) 39.0 - 52.0 %  ° Hemoglobin 7.8  (L) 13.0 - 17.0 g/dL  ° Patient temperature 36.5 C   ° Sample type ARTERIAL   ° Comment VALUES EXPECTED, NO REPEAT   °Prepare RBC (crossmatch)     Status: None  ° Collection Time: 04/01/20  1:25 PM  °Result Value Ref Range  ° Order Confirmation    °  ORDER PROCESSED BY BLOOD BANK °Performed at [facility], [address] , [place] [address] °  °I-STAT 7, (LYTES, BLD GAS, ICA, H+H)     Status: Abnormal  ° Collection Time: 04/01/20  3:26 PM  °Result Value Ref Range  ° pH, Arterial 7.219 (L) 7.350 - 7.450  ° pCO2 arterial 50.8 (H) 32.0 - 48.0 mmHg  ° pO2, Arterial 75 (L) 83.0 - 108.0 mmHg  ° Bicarbonate 21.2 20.0 - 28.0 mmol/L  ° TCO2 23 22 - 32 mmol/L  ° O2 Saturation 93.0 %  ° Acid-base deficit 7.0 (H) 0.0 - 2.0 mmol/L  ° Sodium 143 135 - 145 mmol/L  ° Potassium 4.2 3.5 - 5.1 mmol/L  ° Calcium, Ion 1.15 1.15 - 1.40 mmol/L  °   HCT 23.0 (L) 39.0 - 52.0 %  ° Hemoglobin 7.8 (L) 13.0 - 17.0 g/dL  ° Patient temperature 35.5 C   ° Sample type ARTERIAL   °I-STAT 7, (LYTES, BLD GAS, ICA, H+H)     Status: Abnormal  ° Collection Time: 04/01/20  4:30 PM  °Result Value Ref Range  ° pH, Arterial 7.322 (L) 7.350 - 7.450  ° pCO2 arterial 38.6 32.0 - 48.0 mmHg  ° pO2, Arterial 69 (L) 83.0 - 108.0 mmHg  ° Bicarbonate 20.3 20.0 - 28.0 mmol/L  ° TCO2 22 22 - 32 mmol/L  ° O2 Saturation 94.0 %  ° Acid-base deficit 6.0 (H) 0.0 - 2.0 mmol/L  ° Sodium 143 135 - 145 mmol/L  ° Potassium 4.1 3.5 - 5.1 mmol/L  ° Calcium, Ion 1.12 (L) 1.15 - 1.40 mmol/L  ° HCT 21.0 (L) 39.0 - 52.0 %  ° Hemoglobin 7.1 (L) 13.0 - 17.0 g/dL  ° Patient temperature 35.6 C   ° Sample type ARTERIAL   °Prepare RBC (crossmatch)     Status: None  ° Collection Time: 04/01/20  4:47 PM  °Result Value Ref Range  ° Order Confirmation    °  ORDER PROCESSED BY BLOOD BANK °Performed at [facility], [address] [place], [place] [address] °  °Hemoglobin and hematocrit, blood     Status: Abnormal  ° Collection Time: 04/01/20  6:53 PM  °Result Value Ref Range   ° Hemoglobin 9.4 (L) 13.0 - 17.0 g/dL  ° HCT 27.3 (L) 39.0 - 52.0 %  °Type and screen     Status: None  ° Collection Time: 04/01/20  7:29 PM  °Result Value Ref Range  ° ABO/RH(D) O POS   ° Antibody Screen NEG   ° Sample Expiration    °  04/04/2020,2359 °Performed at [facility], [address] , [place] [address] °  °Glucose, capillary     Status: None  ° Collection Time: 04/01/20  7:36 PM  °Result Value Ref Range  ° Glucose-Capillary 84 70 - 99 mg/dL  °Glucose, capillary     Status: None  ° Collection Time: 04/01/20 11:16 PM  °Result Value Ref Range  ° Glucose-Capillary 97 70 - 99 mg/dL  °Glucose, capillary     Status: Abnormal  ° Collection Time: 04/02/20  3:29 AM  °Result Value Ref Range  ° Glucose-Capillary 127 (H) 70 - 99 mg/dL  °Triglycerides     Status: Abnormal  ° Collection Time: 04/02/20  5:45 AM  °Result Value Ref Range  ° Triglycerides 260 (H) <150 mg/dL  °CBC     Status: Abnormal  ° Collection Time: 04/02/20  5:45 AM  °Result Value Ref Range  ° WBC 5.1 4.0 - 10.5 K/uL  ° RBC 2.97 (L) 4.22 - 5.81 MIL/uL  ° Hemoglobin 8.7 (L) 13.0 - 17.0 g/dL  ° HCT 26.3 (L) 39.0 - 52.0 %  ° MCV 88.6 80.0 - 100.0 fL  ° MCH 29.3 26.0 - 34.0 pg  ° MCHC 33.1 30.0 - 36.0 g/dL  ° RDW 14.2 11.5 - 15.5 %  ° Platelets 102 (L) 150 - 400 K/uL  ° nRBC 0.0 0.0 - 0.2 %  °Basic metabolic panel     Status: Abnormal  ° Collection Time: 04/02/20  5:45 AM  °Result Value Ref Range  ° Sodium 139 135 - 145 mmol/L  ° Potassium 3.7 3.5 - 5.1 mmol/L  ° Chloride 110 98 - 111 mmol/L  ° CO2 19 (  L) 22 - 32 mmol/L  ° Glucose, Bld 110 (H) 70 - 99 mg/dL  ° BUN 11 6 - 20 mg/dL  ° Creatinine, Ser 0.78 0.61 - 1.24 mg/dL  ° Calcium 6.9 (L) 8.9 - 10.3 mg/dL  ° GFR, Estimated >60 >60 mL/min  ° Anion gap 10 5 - 15  °Glucose, capillary     Status: Abnormal  ° Collection Time: 04/02/20  7:52 AM  °Result Value Ref Range  ° Glucose-Capillary 145 (H) 70 - 99 mg/dL  ° ° °ASSESSMENT: Jeremy George is a 25 y.o. male, 1 Day Post-Op s/p  Procedure(s): °CLOSED REDUCTION HIP °IRRIGATION AND DEBRIDEMENT LEFT ELBOW °EXTERNAL FIXATION ELBOW °REPAIR MULTIPLE LACERATIONS FACIAL °IRRIGATION AND DEBRIDEMENT LEFT KNEE AND TRACTION PIN °APPLICATION OF WOUND VAC LEFT ELBOW ° °CV/Blood loss: Acute blood loss anemia, Hgb 8.7 this morning. Received total of 5 units PRBCs 04/01/20 perioperative. ° °PLAN: °Weightbearing: Bedrest °Incisional and dressing care: Plan to change dressings tomorrow °Orthopedic device(s): None  °Pain management: per primary team °VTE prophylaxis: Lovenox once cleared by trauma team, SCDs °ID: Continue Cefepime. No additional abx needed from ortho standpoint °Foley/Lines: foley, continue IVFs °Impediments to Fracture Healing: Polytrauma, significant bone loss. Vit D level 12, will start supplementation  °Dispo: Intubated and sedated currently. Patient will require open reduction internal fixation of his right clavicle fracture, plan proceed with this Friday 04/03/20  °Follow - up plan: TBD ° °Contact information:  Jeremy Haddix MD, Jeremy Trower PA-C. After hours and holidays please check [email or web address] for group call information for [facility] ° ° °Jeremy Lafauci A. Arcadia Gorgas, PA-C °[phone] (office) °[email or web address] ° °

## 2020-04-02 NOTE — Progress Notes (Signed)
Foley discontinued at this time with 250cc urine noted. External cath placed. Will monitor for void.

## 2020-04-02 NOTE — Plan of Care (Signed)
  Problem: Nutrition: Goal: Adequate nutrition will be maintained Outcome: Progressing   
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
## 2020-04-02 NOTE — H&P (View-Only) (Signed)
Orthopaedic Trauma Progress Note  SUBJECTIVE: Intubated and sedated. No acute events overnight.   OBJECTIVE:  Vitals:   04/02/20 0800 04/02/20 0900  BP: 115/63 (!) 112/57  Pulse: 83 90  Resp: (!) 24 (!) 24  Temp: 99.5 F (37.5 C) 100.22 F (37.9 C)  SpO2: 100% 97%    General: Intubated and sedated Respiratory: Mechanically ventilated, no increased work of breathing.  LUE: Dressing CDI.  Compartments soft and compressible.  Unable to obtain reliable motor or sensory exam.  + Radial pulse LLE: Dressings clean, dry, intact.  Unable to obtain reliable motor or sensory exam.  Compartments are soft and compressible.  +DP pulse  IMAGING: Stable post op imaging.   LABS:  Results for orders placed or performed during the hospital encounter of 03/29/20 (from the past 24 hour(s))  I-STAT 7, (LYTES, BLD GAS, ICA, H+H)     Status: Abnormal   Collection Time: 04/01/20 11:36 AM  Result Value Ref Range   pH, Arterial 7.216 (L) 7.350 - 7.450   pCO2 arterial 51.4 (H) 32.0 - 48.0 mmHg   pO2, Arterial 53 (L) 83.0 - 108.0 mmHg   Bicarbonate 20.5 20.0 - 28.0 mmol/L   TCO2 22 22 - 32 mmol/L   O2 Saturation 76.0 %   Acid-base deficit 7.0 (H) 0.0 - 2.0 mmol/L   Sodium 145 135 - 145 mmol/L   Potassium 3.6 3.5 - 5.1 mmol/L   Calcium, Ion 1.23 1.15 - 1.40 mmol/L   HCT 24.0 (L) 39.0 - 52.0 %   Hemoglobin 8.2 (L) 13.0 - 17.0 g/dL   Patient temperature 62.7 C    Sample type ARTERIAL   I-STAT 7, (LYTES, BLD GAS, ICA, H+H)     Status: Abnormal   Collection Time: 04/01/20  1:22 PM  Result Value Ref Range   pH, Arterial 7.173 (LL) 7.350 - 7.450   pCO2 arterial 59.5 (H) 32.0 - 48.0 mmHg   pO2, Arterial 83 83.0 - 108.0 mmHg   Bicarbonate 22.0 20.0 - 28.0 mmol/L   TCO2 24 22 - 32 mmol/L   O2 Saturation 93.0 %   Acid-base deficit 6.0 (H) 0.0 - 2.0 mmol/L   Sodium 143 135 - 145 mmol/L   Potassium 3.8 3.5 - 5.1 mmol/L   Calcium, Ion 1.19 1.15 - 1.40 mmol/L   HCT 23.0 (L) 39.0 - 52.0 %   Hemoglobin 7.8  (L) 13.0 - 17.0 g/dL   Patient temperature 03.5 C    Sample type ARTERIAL    Comment VALUES EXPECTED, NO REPEAT   Prepare RBC (crossmatch)     Status: None   Collection Time: 04/01/20  1:25 PM  Result Value Ref Range   Order Confirmation      ORDER PROCESSED BY BLOOD BANK Performed at [facility], [address], [place], [place] [address]   I-STAT 7, (LYTES, BLD GAS, ICA, H+H)     Status: Abnormal   Collection Time: 04/01/20  3:26 PM  Result Value Ref Range   pH, Arterial 7.219 (L) 7.350 - 7.450   pCO2 arterial 50.8 (H) 32.0 - 48.0 mmHg   pO2, Arterial 75 (L) 83.0 - 108.0 mmHg   Bicarbonate 21.2 20.0 - 28.0 mmol/L   TCO2 23 22 - 32 mmol/L   O2 Saturation 93.0 %   Acid-base deficit 7.0 (H) 0.0 - 2.0 mmol/L   Sodium 143 135 - 145 mmol/L   Potassium 4.2 3.5 - 5.1 mmol/L   Calcium, Ion 1.15 1.15 - 1.40 mmol/L  HCT 23.0 (L) 39.0 - 52.0 %   Hemoglobin 7.8 (L) 13.0 - 17.0 g/dL   Patient temperature 40.9 C    Sample type ARTERIAL   I-STAT 7, (LYTES, BLD GAS, ICA, H+H)     Status: Abnormal   Collection Time: 04/01/20  4:30 PM  Result Value Ref Range   pH, Arterial 7.322 (L) 7.350 - 7.450   pCO2 arterial 38.6 32.0 - 48.0 mmHg   pO2, Arterial 69 (L) 83.0 - 108.0 mmHg   Bicarbonate 20.3 20.0 - 28.0 mmol/L   TCO2 22 22 - 32 mmol/L   O2 Saturation 94.0 %   Acid-base deficit 6.0 (H) 0.0 - 2.0 mmol/L   Sodium 143 135 - 145 mmol/L   Potassium 4.1 3.5 - 5.1 mmol/L   Calcium, Ion 1.12 (L) 1.15 - 1.40 mmol/L   HCT 21.0 (L) 39.0 - 52.0 %   Hemoglobin 7.1 (L) 13.0 - 17.0 g/dL   Patient temperature 73.5 C    Sample type ARTERIAL   Prepare RBC (crossmatch)     Status: None   Collection Time: 04/01/20  4:47 PM  Result Value Ref Range   Order Confirmation      ORDER PROCESSED BY BLOOD BANK Performed at [facility], [address], [place], [place] [address]   Hemoglobin and hematocrit, blood     Status: Abnormal   Collection Time: 04/01/20  6:53 PM  Result Value Ref Range    Hemoglobin 9.4 (L) 13.0 - 17.0 g/dL   HCT 42.6 (L) 83.4 - 19.6 %  Type and screen     Status: None   Collection Time: 04/01/20  7:29 PM  Result Value Ref Range   ABO/RH(D) O POS    Antibody Screen NEG    Sample Expiration      04/04/2020,2359 Performed at [facility], [address], [place], [place] [address]   Glucose, capillary     Status: None   Collection Time: 04/01/20  7:36 PM  Result Value Ref Range   Glucose-Capillary 84 70 - 99 mg/dL  Glucose, capillary     Status: None   Collection Time: 04/01/20 11:16 PM  Result Value Ref Range   Glucose-Capillary 97 70 - 99 mg/dL  Glucose, capillary     Status: Abnormal   Collection Time: 04/02/20  3:29 AM  Result Value Ref Range   Glucose-Capillary 127 (H) 70 - 99 mg/dL  Triglycerides     Status: Abnormal   Collection Time: 04/02/20  5:45 AM  Result Value Ref Range   Triglycerides 260 (H) <150 mg/dL  CBC     Status: Abnormal   Collection Time: 04/02/20  5:45 AM  Result Value Ref Range   WBC 5.1 4.0 - 10.5 K/uL   RBC 2.97 (L) 4.22 - 5.81 MIL/uL   Hemoglobin 8.7 (L) 13.0 - 17.0 g/dL   HCT 98.9 (L) 21.1 - 94.1 %   MCV 88.6 80.0 - 100.0 fL   MCH 29.3 26.0 - 34.0 pg   MCHC 33.1 30.0 - 36.0 g/dL   RDW 74.0 81.4 - 48.1 %   Platelets 102 (L) 150 - 400 K/uL   nRBC 0.0 0.0 - 0.2 %  Basic metabolic panel     Status: Abnormal   Collection Time: 04/02/20  5:45 AM  Result Value Ref Range   Sodium 139 135 - 145 mmol/L   Potassium 3.7 3.5 - 5.1 mmol/L   Chloride 110 98 - 111 mmol/L   CO2 19 (  L) 22 - 32 mmol/L   Glucose, Bld 110 (H) 70 - 99 mg/dL   BUN 11 6 - 20 mg/dL   Creatinine, Ser 5.70 0.61 - 1.24 mg/dL   Calcium 6.9 (L) 8.9 - 10.3 mg/dL   GFR, Estimated >17 >79 mL/min   Anion gap 10 5 - 15  Glucose, capillary     Status: Abnormal   Collection Time: 04/02/20  7:52 AM  Result Value Ref Range   Glucose-Capillary 145 (H) 70 - 99 mg/dL    ASSESSMENT: Jeremy George is a 25 y.o. male, 1 Day Post-Op s/p  Procedure(s): CLOSED REDUCTION HIP IRRIGATION AND DEBRIDEMENT LEFT ELBOW EXTERNAL FIXATION ELBOW REPAIR MULTIPLE LACERATIONS FACIAL IRRIGATION AND DEBRIDEMENT LEFT KNEE AND TRACTION PIN APPLICATION OF WOUND VAC LEFT ELBOW  CV/Blood loss: Acute blood loss anemia, Hgb 8.7 this morning. Received total of 5 units PRBCs 04/01/20 perioperative.  PLAN: Weightbearing: Bedrest Incisional and dressing care: Plan to change dressings tomorrow Orthopedic device(s): None  Pain management: per primary team VTE prophylaxis: Lovenox once cleared by trauma team, SCDs ID: Continue Cefepime. No additional abx needed from ortho standpoint Foley/Lines: foley, continue IVFs Impediments to Fracture Healing: Polytrauma, significant bone loss. Vit D level 12, will start supplementation  Dispo: Intubated and sedated currently. Patient will require open reduction internal fixation of his right clavicle fracture, plan proceed with this Friday 04/03/20  Follow - up plan: TBD  Contact information:  Truitt Merle MD, Ulyses Southward PA-C. After hours and holidays please check [email or web address] for group call information for [facility]   Decorey Wahlert A. Michaelyn Barter, PA-C [phone] (office) [email or web address]

## 2020-04-03 ENCOUNTER — Inpatient Hospital Stay (HOSPITAL_COMMUNITY): Payer: Medicaid Other

## 2020-04-03 ENCOUNTER — Inpatient Hospital Stay: Payer: Self-pay

## 2020-04-03 ENCOUNTER — Inpatient Hospital Stay (HOSPITAL_COMMUNITY): Payer: Medicaid Other | Admitting: Anesthesiology

## 2020-04-03 ENCOUNTER — Encounter (HOSPITAL_COMMUNITY): Admission: EM | Disposition: A | Discharge status: Rehabilitation Facility | Payer: Self-pay | Source: Home / Self Care

## 2020-04-03 HISTORY — PX: ORIF CLAVICULAR FRACTURE: SHX5055

## 2020-04-03 LAB — BPAM RBC
Blood Product Expiration Date: 202202222359
Blood Product Expiration Date: 202203082359
Blood Product Expiration Date: 202203082359
Blood Product Expiration Date: 202203082359
Blood Product Expiration Date: 202203082359
Blood Product Expiration Date: 202203102359
Blood Product Expiration Date: 202203102359
Blood Product Expiration Date: 202203112359
ISSUE DATE / TIME: 202202060905
ISSUE DATE / TIME: 202202061458
ISSUE DATE / TIME: 202202061458
ISSUE DATE / TIME: 202202061608
ISSUE DATE / TIME: 202202062005
ISSUE DATE / TIME: 202202091003
ISSUE DATE / TIME: 202202091333
ISSUE DATE / TIME: 202202091649
Unit Type and Rh: 5100
Unit Type and Rh: 5100
Unit Type and Rh: 5100
Unit Type and Rh: 5100
Unit Type and Rh: 5100
Unit Type and Rh: 5100
Unit Type and Rh: 5100
Unit Type and Rh: 5100

## 2020-04-03 LAB — TYPE AND SCREEN
ABO/RH(D): O POS
Antibody Screen: NEGATIVE
Unit division: 0
Unit division: 0
Unit division: 0
Unit division: 0
Unit division: 0
Unit division: 0
Unit division: 0
Unit division: 0

## 2020-04-03 LAB — BASIC METABOLIC PANEL
Anion gap: 7 (ref 5–15)
BUN: 13 mg/dL (ref 6–20)
CO2: 21 mmol/L — ABNORMAL LOW (ref 22–32)
Calcium: 7.8 mg/dL — ABNORMAL LOW (ref 8.9–10.3)
Chloride: 114 mmol/L — ABNORMAL HIGH (ref 98–111)
Creatinine, Ser: 0.83 mg/dL (ref 0.61–1.24)
GFR, Estimated: >60 mL/min (ref >60)
Glucose, Bld: 129 mg/dL — ABNORMAL HIGH (ref 70–99)
Potassium: 3.2 mmol/L — ABNORMAL LOW (ref 3.5–5.1)
Sodium: 142 mmol/L (ref 135–145)

## 2020-04-03 LAB — CBC
HCT: 24.6 % — ABNORMAL LOW (ref 39.0–52.0)
Hemoglobin: 8.6 g/dL — ABNORMAL LOW (ref 13.0–17.0)
MCH: 30.5 pg (ref 26.0–34.0)
MCHC: 35 g/dL (ref 30.0–36.0)
MCV: 87.2 fL (ref 80.0–100.0)
Platelets: 148 10*3/uL — ABNORMAL LOW (ref 150–400)
RBC: 2.82 MIL/uL — ABNORMAL LOW (ref 4.22–5.81)
RDW: 14.3 % (ref 11.5–15.5)
WBC: 8.9 10*3/uL (ref 4.0–10.5)
nRBC: 0 % (ref 0.0–0.2)

## 2020-04-03 LAB — CULTURE, RESPIRATORY W GRAM STAIN

## 2020-04-03 LAB — GLUCOSE, CAPILLARY
Glucose-Capillary: 122 mg/dL — ABNORMAL HIGH (ref 70–99)
Glucose-Capillary: 115 mg/dL — ABNORMAL HIGH (ref 70–99)
Glucose-Capillary: 86 mg/dL (ref 70–99)
Glucose-Capillary: 88 mg/dL (ref 70–99)

## 2020-04-03 IMAGING — MR MR CERVICAL SPINE W/O CM
2 series · 30 of 30 positions shown · non-contrast
Comparison: None.

CLINICAL DATA: Trauma to the head and neck.

EXAM:
MRI CERVICAL SPINE WITHOUT CONTRAST
TECHNIQUE: Multiplanar, multisequence MR imaging of the cervical spine was
performed. No intravenous contrast was administered.

[Series 13: T1 · sagittal · 3.0mm · 0.78mm/px · 15 of 15 slices shown]
[im 1/15]
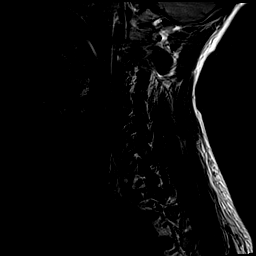
[im 2/15]
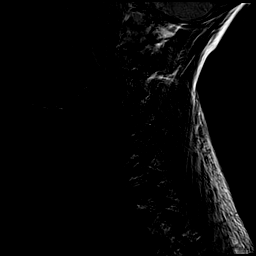
[im 3/15]
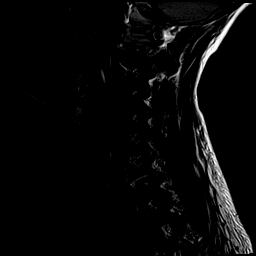
[im 4/15]
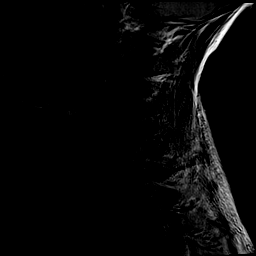
[im 5/15]
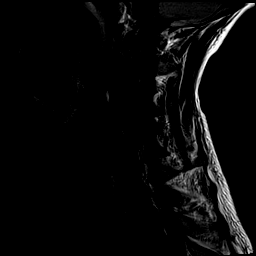
[im 6/15]
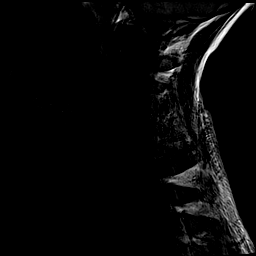
[im 7/15]
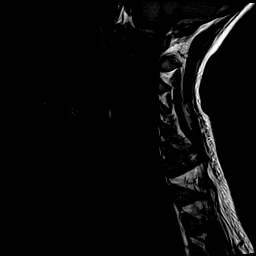
[im 8/15]
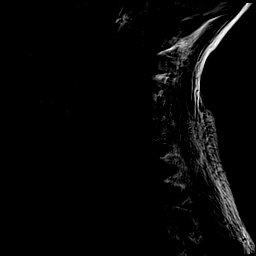
[im 9/15]
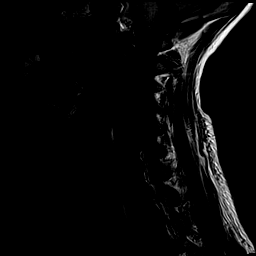
[im 10/15]
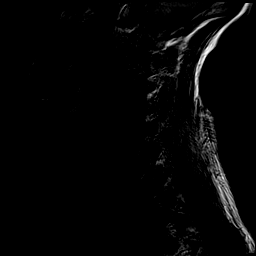
[im 11/15]
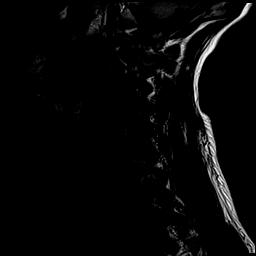
[im 12/15]
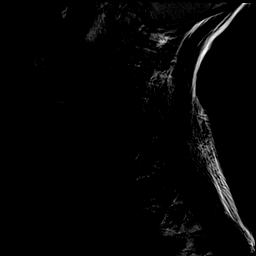
[im 13/15]
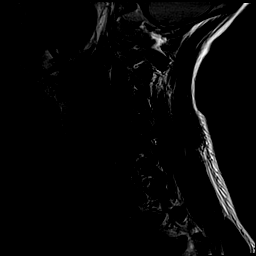
[im 14/15]
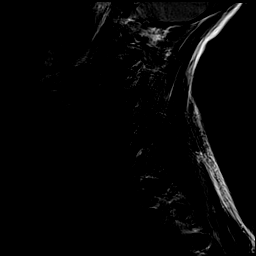
[im 15/15]
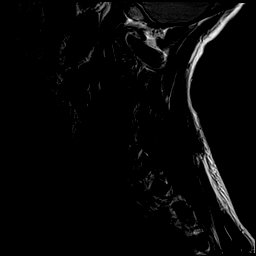

[Series 14: T2 · sagittal · 3.0mm · 0.66mm/px · 15 of 15 slices shown]
[im 1/15]
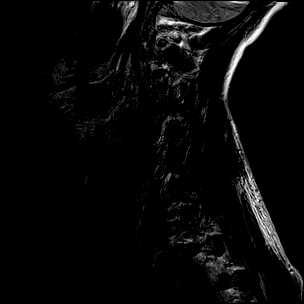
[im 2/15]
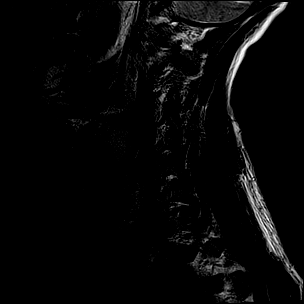
[im 3/15]
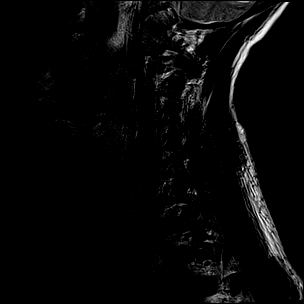
[im 4/15]
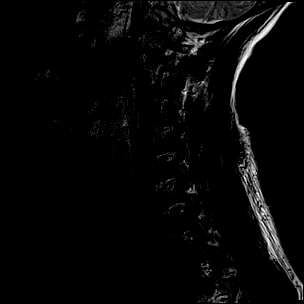
[im 5/15]
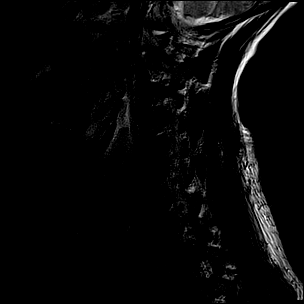
[im 6/15]
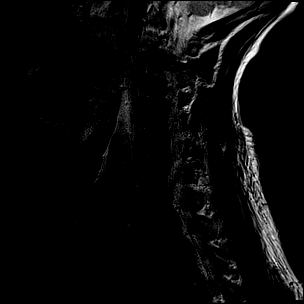
[im 7/15]
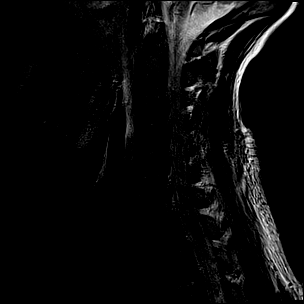
[im 8/15]
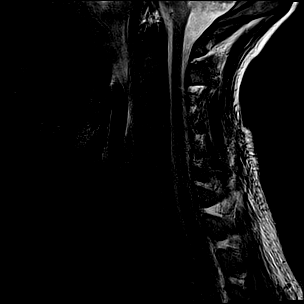
[im 9/15]
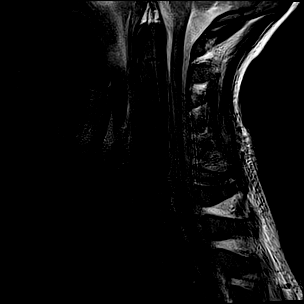
[im 10/15]
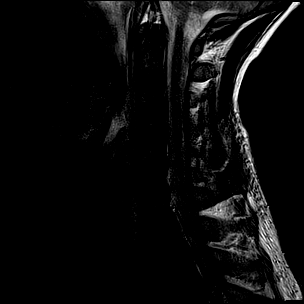
[im 11/15]
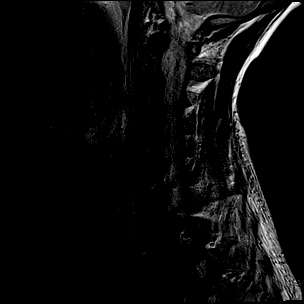
[im 12/15]
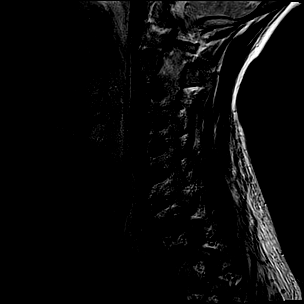
[im 13/15]
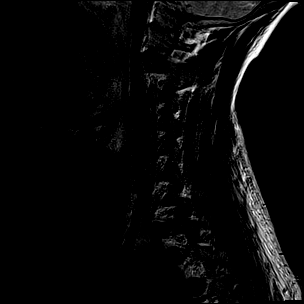
[im 14/15]
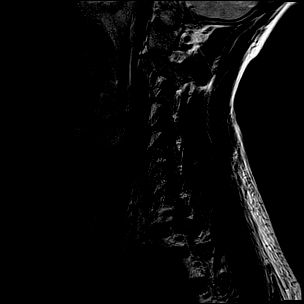
[im 15/15]
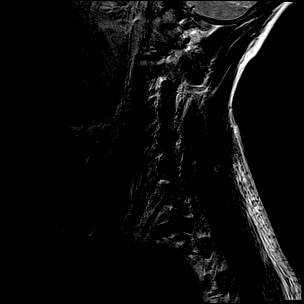

[30 of 30 positions shown; findings below may reference images not displayed]

FINDINGS: The study is limited only to sagittal T1 and sagittal T2 imaging.
There is considerable motion degradation. Water sensitive imaging
was not achieved.

Alignment: No malalignment

Vertebrae: No evidence of fracture.

Cord: No sign of cord injury or hemorrhage.

Posterior Fossa, vertebral arteries, paraspinal tissues: See results
of brain study.

Disc levels:

No evidence of degenerative disc disease or traumatic disc
herniation.
IMPRESSION: Motion degraded and abbreviated exam. No evidence of cord injury or
hemorrhage. No significant degenerative disease.

## 2020-04-03 IMAGING — RF DG C-ARM 1-60 MIN
1 series · 7 of 7 positions shown · non-contrast
Comparison: Film from earlier in the same day.

CLINICAL DATA: Right clavicular fracture

EXAM:
DG C-ARM 1-60 MIN; RIGHT CLAVICLE - 2+ VIEWS

[Series 1: run · 7 of 7 slices shown]
[im 1/7]
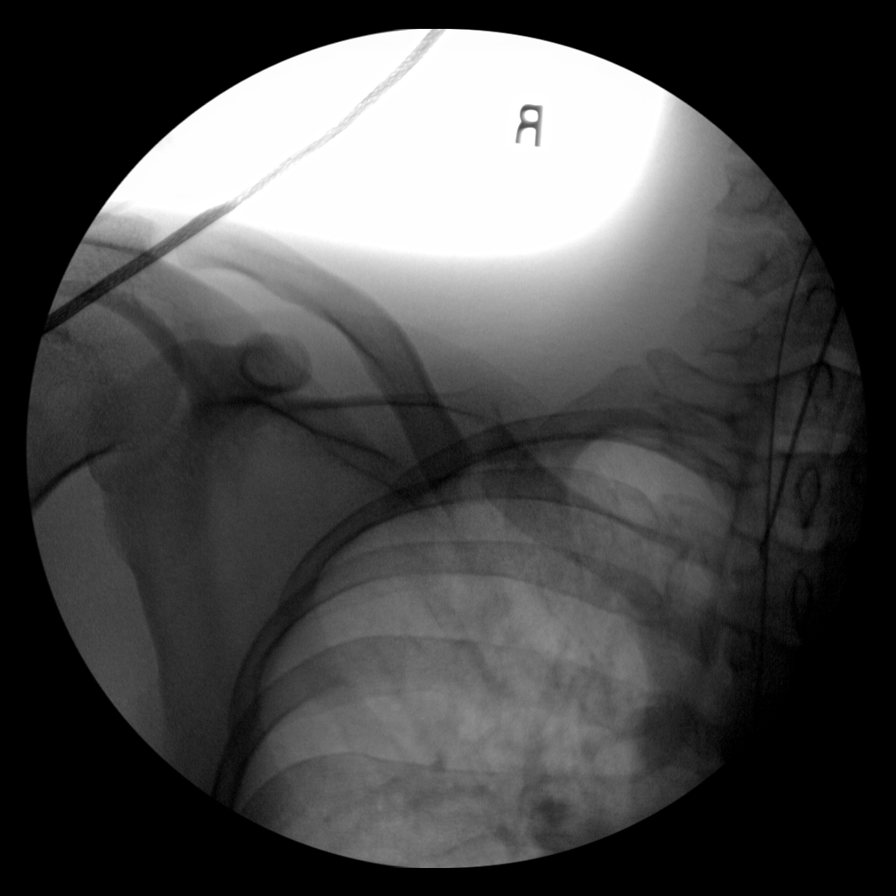
[im 2/7]
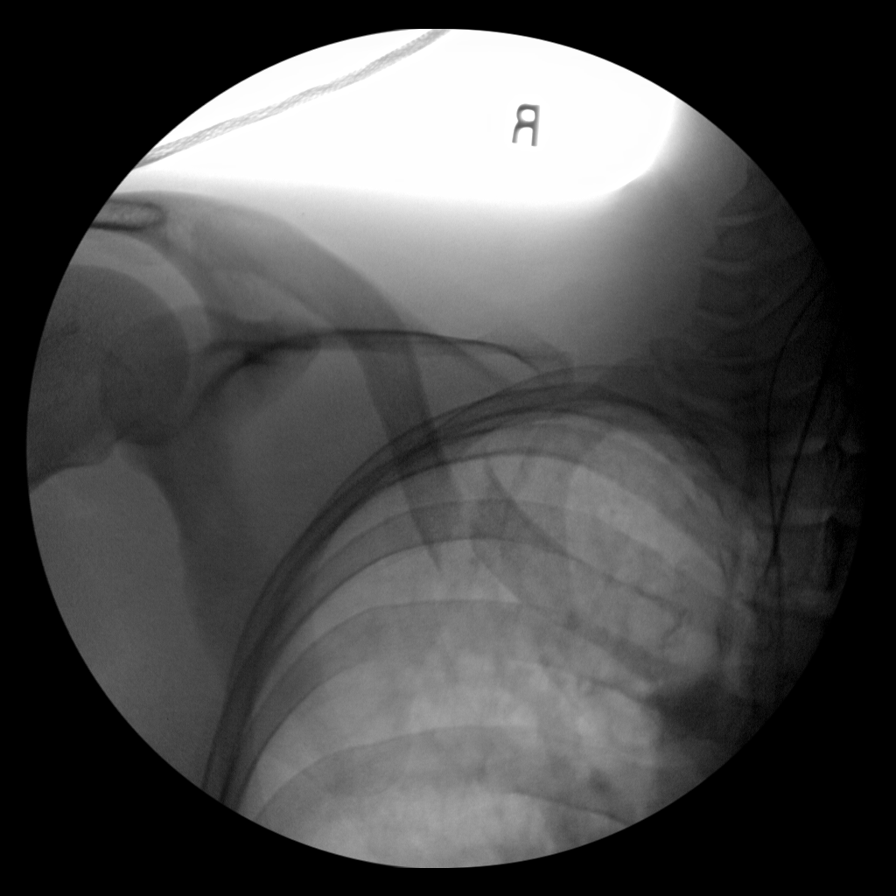
[im 3/7]
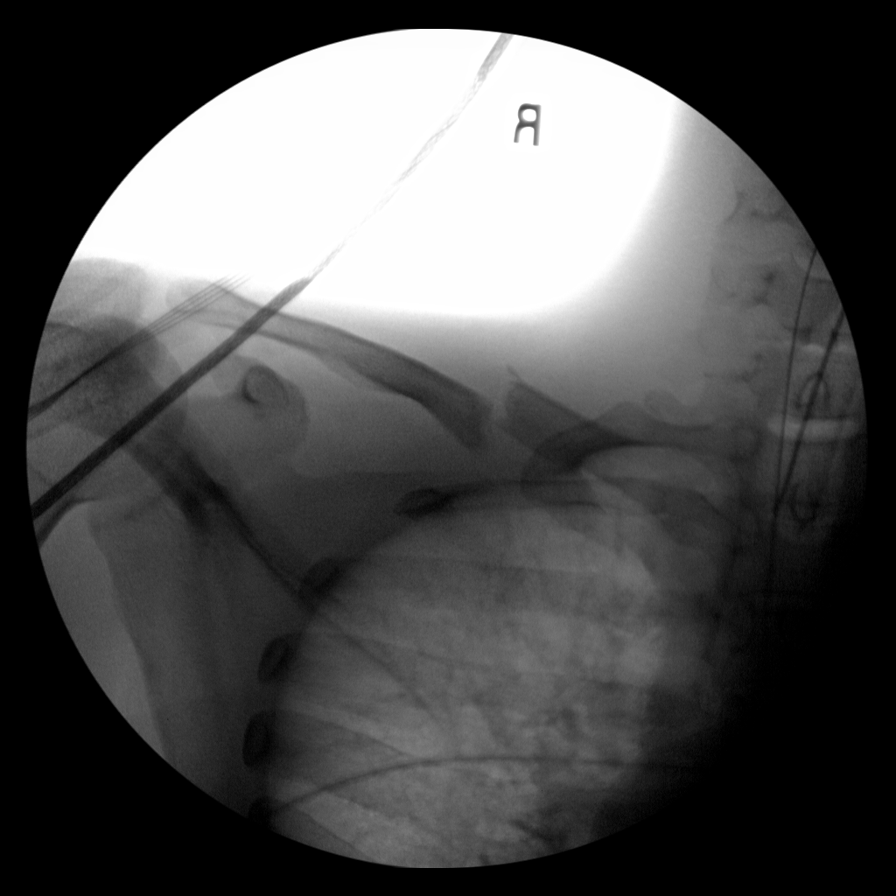
[im 4/7]
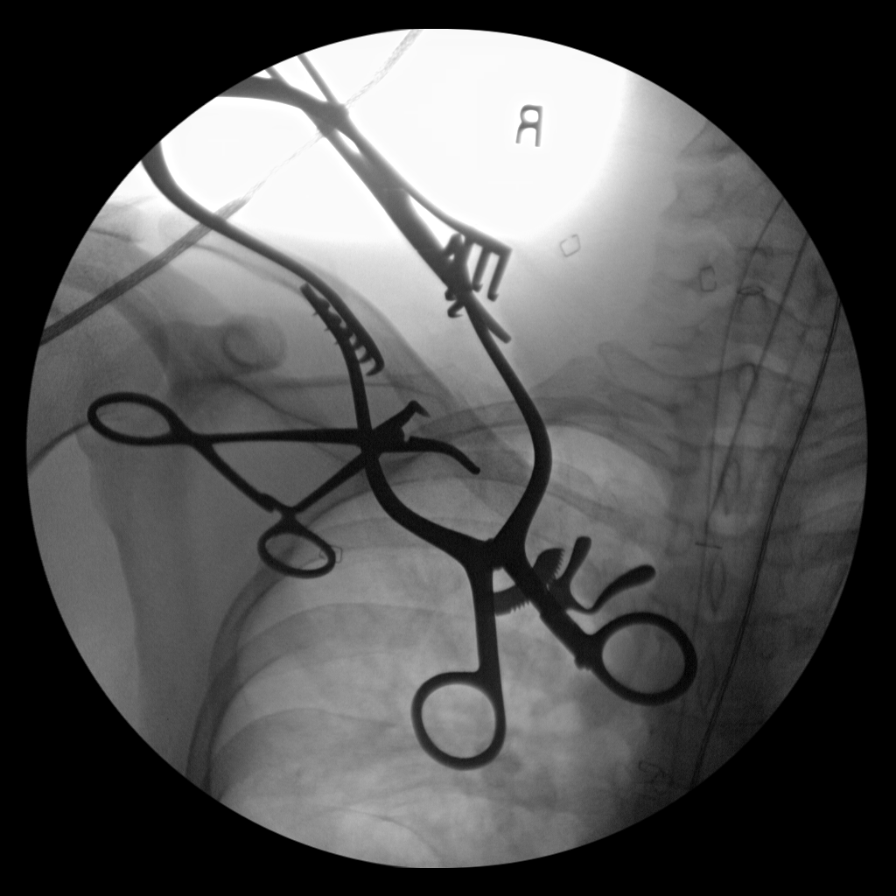
[im 5/7]
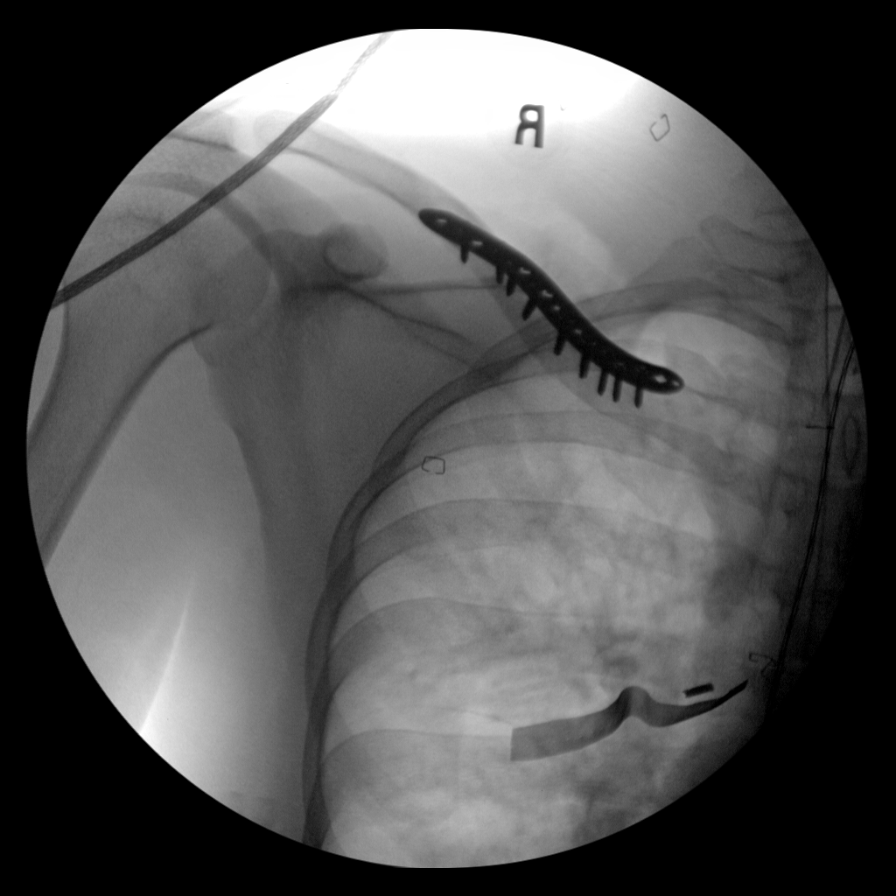
[im 6/7]
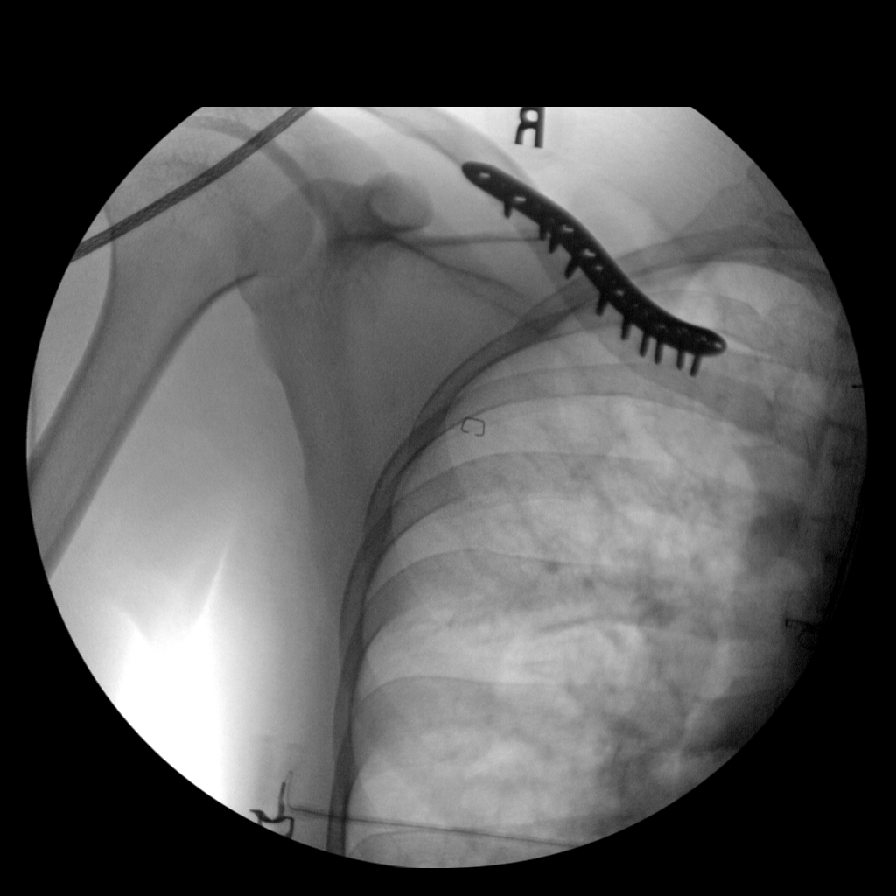
[im 7/7]
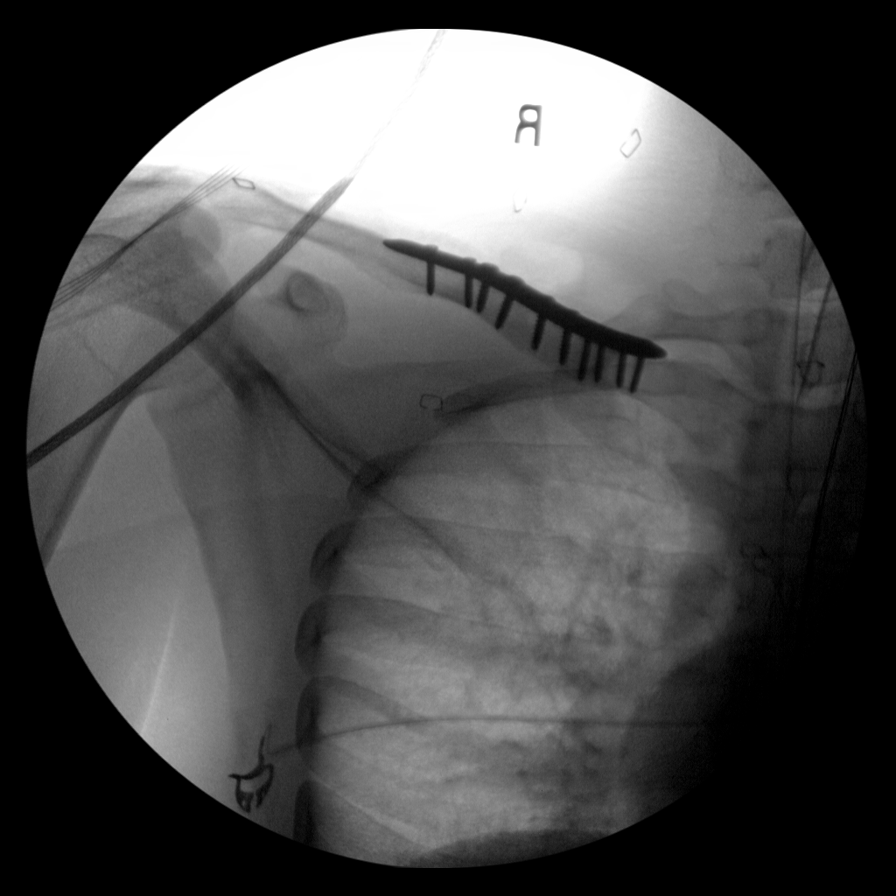

[7 of 7 positions shown; findings below may reference images not displayed]

FLUOROSCOPY TIME:  Fluoroscopy Time:  7 seconds

Radiation Exposure Index (if provided by the fluoroscopic device):
0.67 mGy

Number of Acquired Spot Images: 7
FINDINGS: Initial images again demonstrate the right midshaft clavicular
fracture with mild downward displacement of the distal fracture
fragment. Fracture fragments were aligned and fixation plate placed.
IMPRESSION: ORIF of right clavicular fracture.

## 2020-04-03 IMAGING — MR MR HEAD W/O CM
11 series · 48 of 48 positions shown · non-contrast
Comparison: Head CT [DATE]

CLINICAL DATA: Head trauma.  Abnormal mental status.

EXAM:
MRI HEAD WITHOUT CONTRAST
TECHNIQUE: Multiplanar, multiecho pulse sequences of the brain and surrounding
structures were obtained without intravenous contrast.

[Series 5: DWI · axial · 3.0mm · 0.88mm/px · z∈[-87,+60]mm · 10 of 100 slices shown (1 of 4)]
[im 1/100]
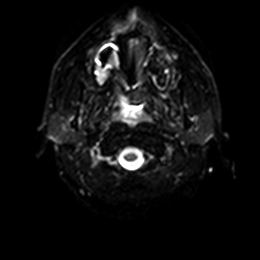
[im 12/100]
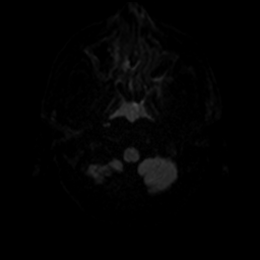
[im 23/100]
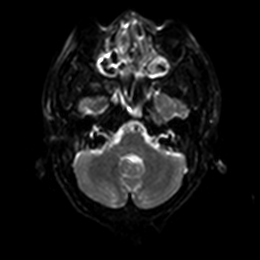
[im 34/100]
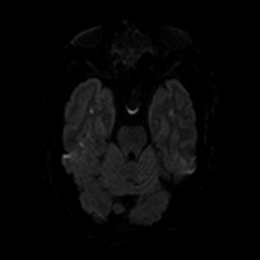
[im 45/100]
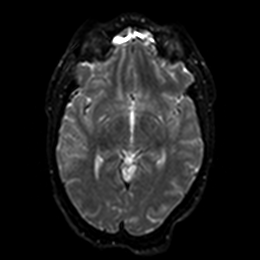
[im 56/100]
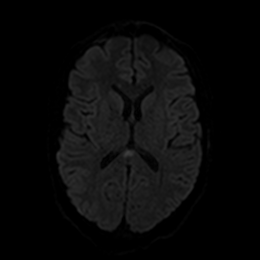
[im 67/100]
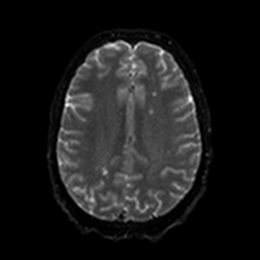
[im 78/100]
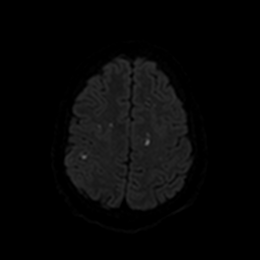
[im 89/100]
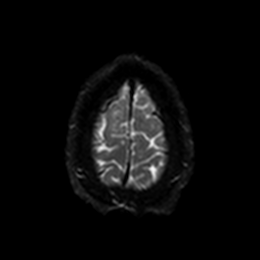
[im 100/100]
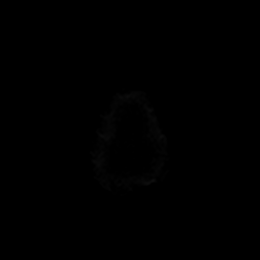

[Series 5: FLAIR · axial · 5.0mm · 0.90mm/px · z∈[-86,+58]mm · 3 of 25 slices shown (1 of 3)]
[im 1/25]
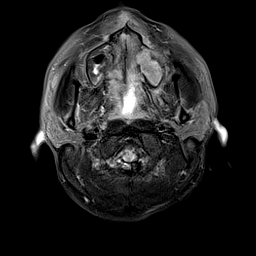
[im 13/25]
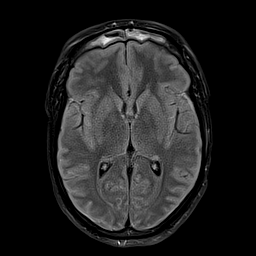
[im 25/25]
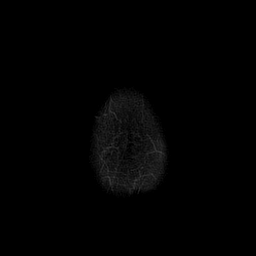

[Series 6: ax hemo · axial · 5.0mm · 0.86mm/px · z∈[-86,+58]mm · 3 of 25 slices shown]
[im 1/25]
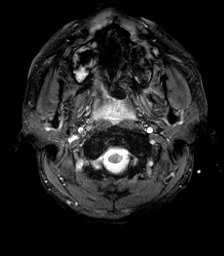
[im 13/25]
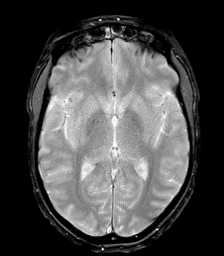
[im 25/25]
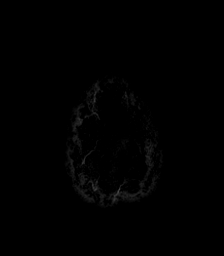

[Series 6: DWI · axial · 3.0mm · 0.88mm/px · z∈[-87,+60]mm · 5 of 50 slices shown (2 of 4)]
[im 1/50]
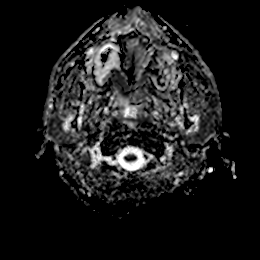
[im 13/50]
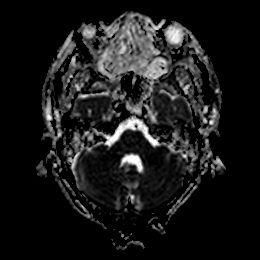
[im 25/50]
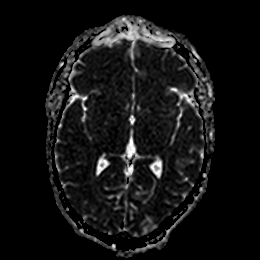
[im 37/50]
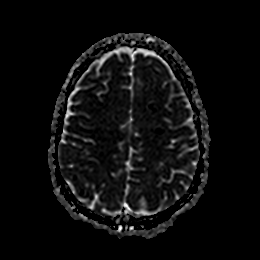
[im 50/50]
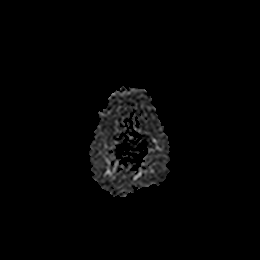

[Series 7: FLAIR · axial · 5.0mm · 0.45mm/px · z∈[-85,+58]mm · 3 of 25 slices shown (2 of 3)]
[im 1/25]
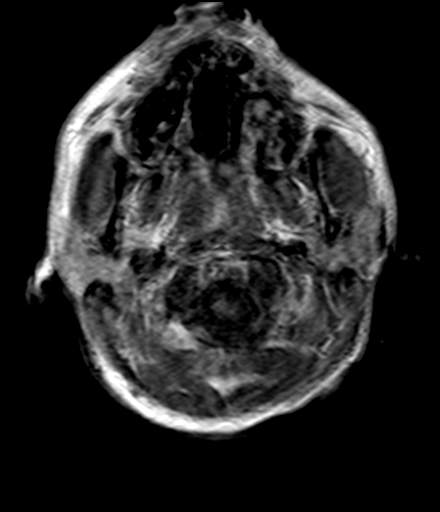
[im 13/25]
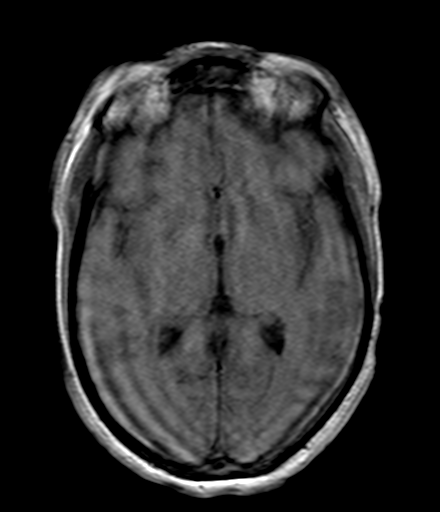
[im 25/25]
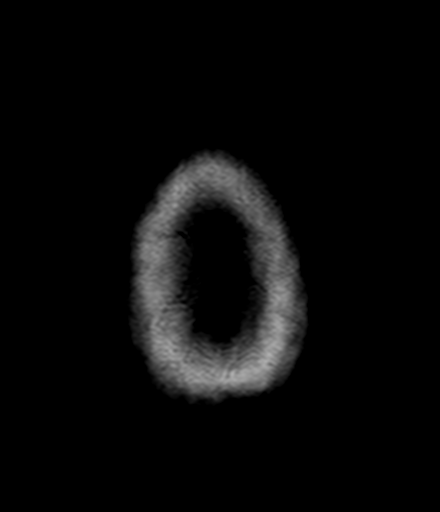

[Series 7: DWI · coronal · 4.0mm · 0.88mm/px · 8 of 72 slices shown (3 of 4)]
[im 1/72]
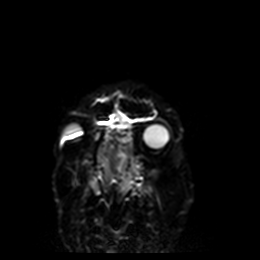
[im 11/72]
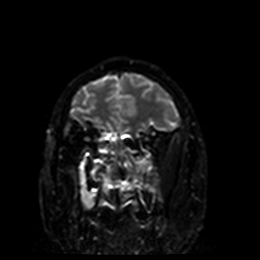
[im 21/72]
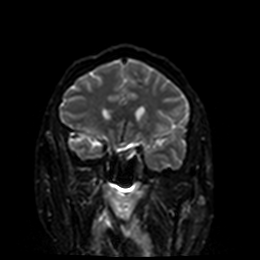
[im 31/72]
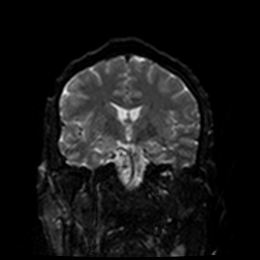
[im 41/72]
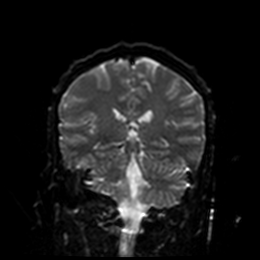
[im 51/72]
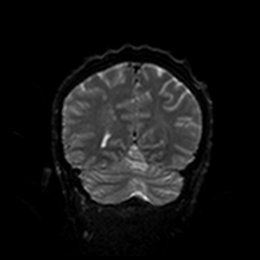
[im 61/72]
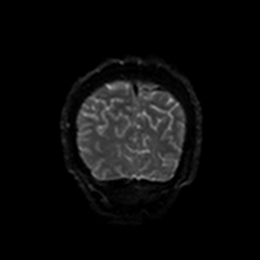
[im 72/72]
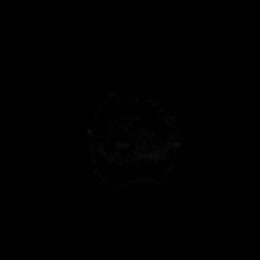

[Series 8: DWI · coronal · 4.0mm · 0.88mm/px · 4 of 36 slices shown (4 of 4)]
[im 1/36]
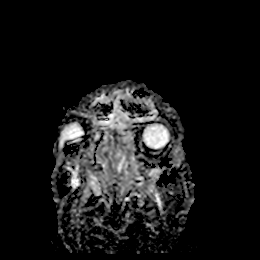
[im 12/36]
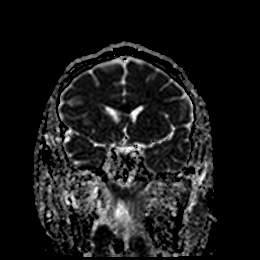
[im 24/36]
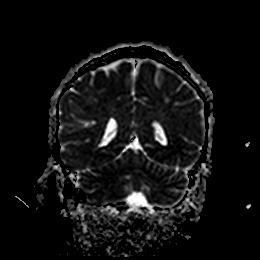
[im 36/36]
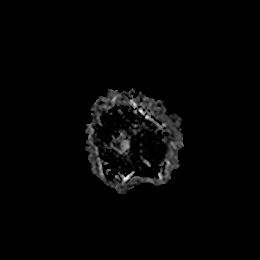

[Series 8: T2 · coronal · 5.0mm · 0.72mm/px · 3 of 31 slices shown (1 of 2)]
[im 1/31]
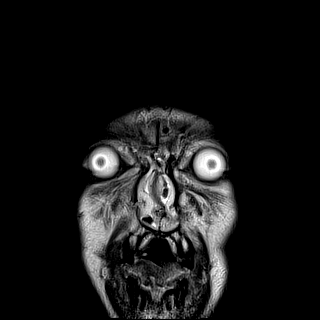
[im 16/31]
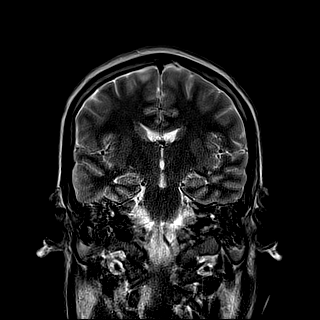
[im 31/31]
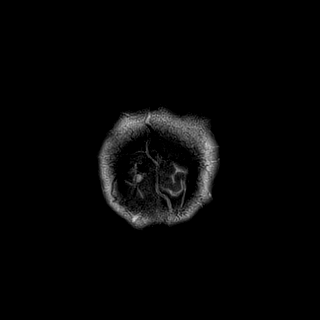

[Series 13: FLAIR · axial · 5.0mm · 0.45mm/px · z∈[-85,+59]mm · 3 of 25 slices shown (3 of 3)]
[im 1/25]
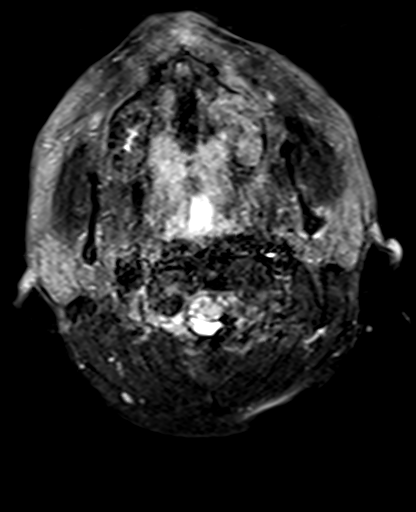
[im 13/25]
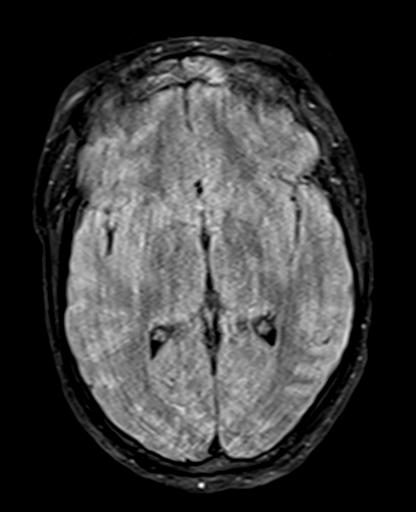
[im 25/25]
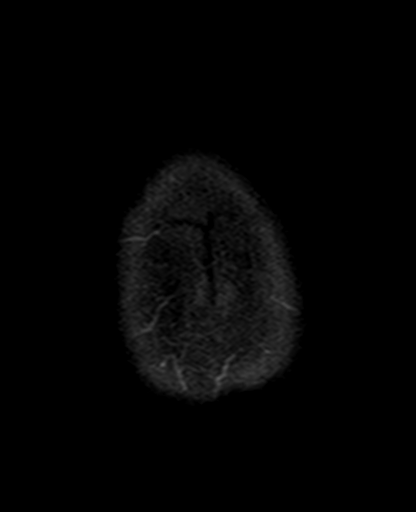

[Series 14: T1 · sagittal · 5.0mm · 0.90mm/px · 3 of 25 slices shown]
[im 1/25]
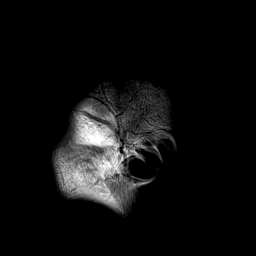
[im 13/25]
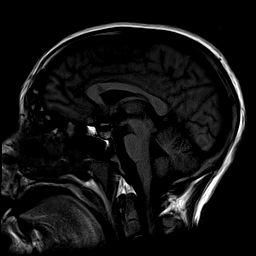
[im 25/25]
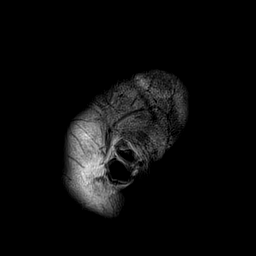

[Series 15: T2 · axial · 5.0mm · 0.72mm/px · z∈[-85,+58]mm · 3 of 25 slices shown (2 of 2)]
[im 1/25]
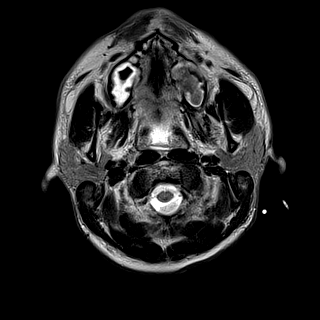
[im 13/25]
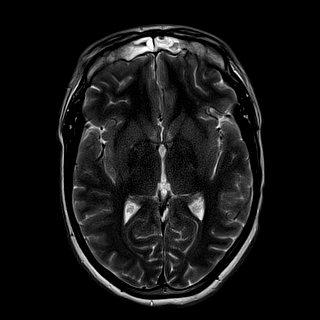
[im 25/25]
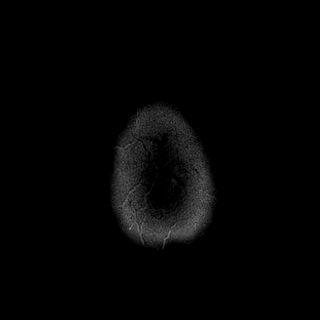

[48 of 48 positions shown; findings below may reference images not displayed]

FINDINGS: Brain: The study suffers from motion degradation. No abnormality is
seen affecting the brainstem or cerebellum. Within the cerebral
hemispheres, there are widespread shear injuries consistent with
diffuse axonal injury. Foci restricted diffusion, T2 and FLAIR
signal and focal hemosiderin deposition present scattered throughout
the brain with involvement of all lobes on both sides as well as the
corpus callosum. No evidence large intraparenchymal hematoma. No
asymmetric mass effect or shift. No subdural or epidural hematoma.
No visible subarachnoid or intraventricular blood products on
today's exam.

Vascular: Major vessels at the base of the brain show flow.

Skull and upper cervical spine: Negative

Sinuses/Orbits: Mucosal inflammatory change in opacification
throughout the paranasal sinuses. Bilateral mastoid effusions.
Orbits negative.

Other: None
IMPRESSION: 1. Severely motion degraded exam.
2. Widespread shear injuries throughout the brain with involvement
of all lobes as well as the corpus callosum. Scattered foci of
restricted diffusion, T2 and FLAIR signal and focal hemosiderin
deposition consistent with diffuse axonal injury. No large
intraparenchymal hematoma. No mass effect or shift. No brainstem
involvement. Findings consistent with grade 2 injury.
3. Mucosal inflammatory change throughout the paranasal sinuses.
Bilateral mastoid effusions.

## 2020-04-03 IMAGING — DX DG CHEST 1V PORT
1 series · 1 of 1 positions shown · non-contrast
Comparison: [DATE]

CLINICAL DATA: History of left pneumothorax.

EXAM:
PORTABLE CHEST 1 VIEW

[chest ap]
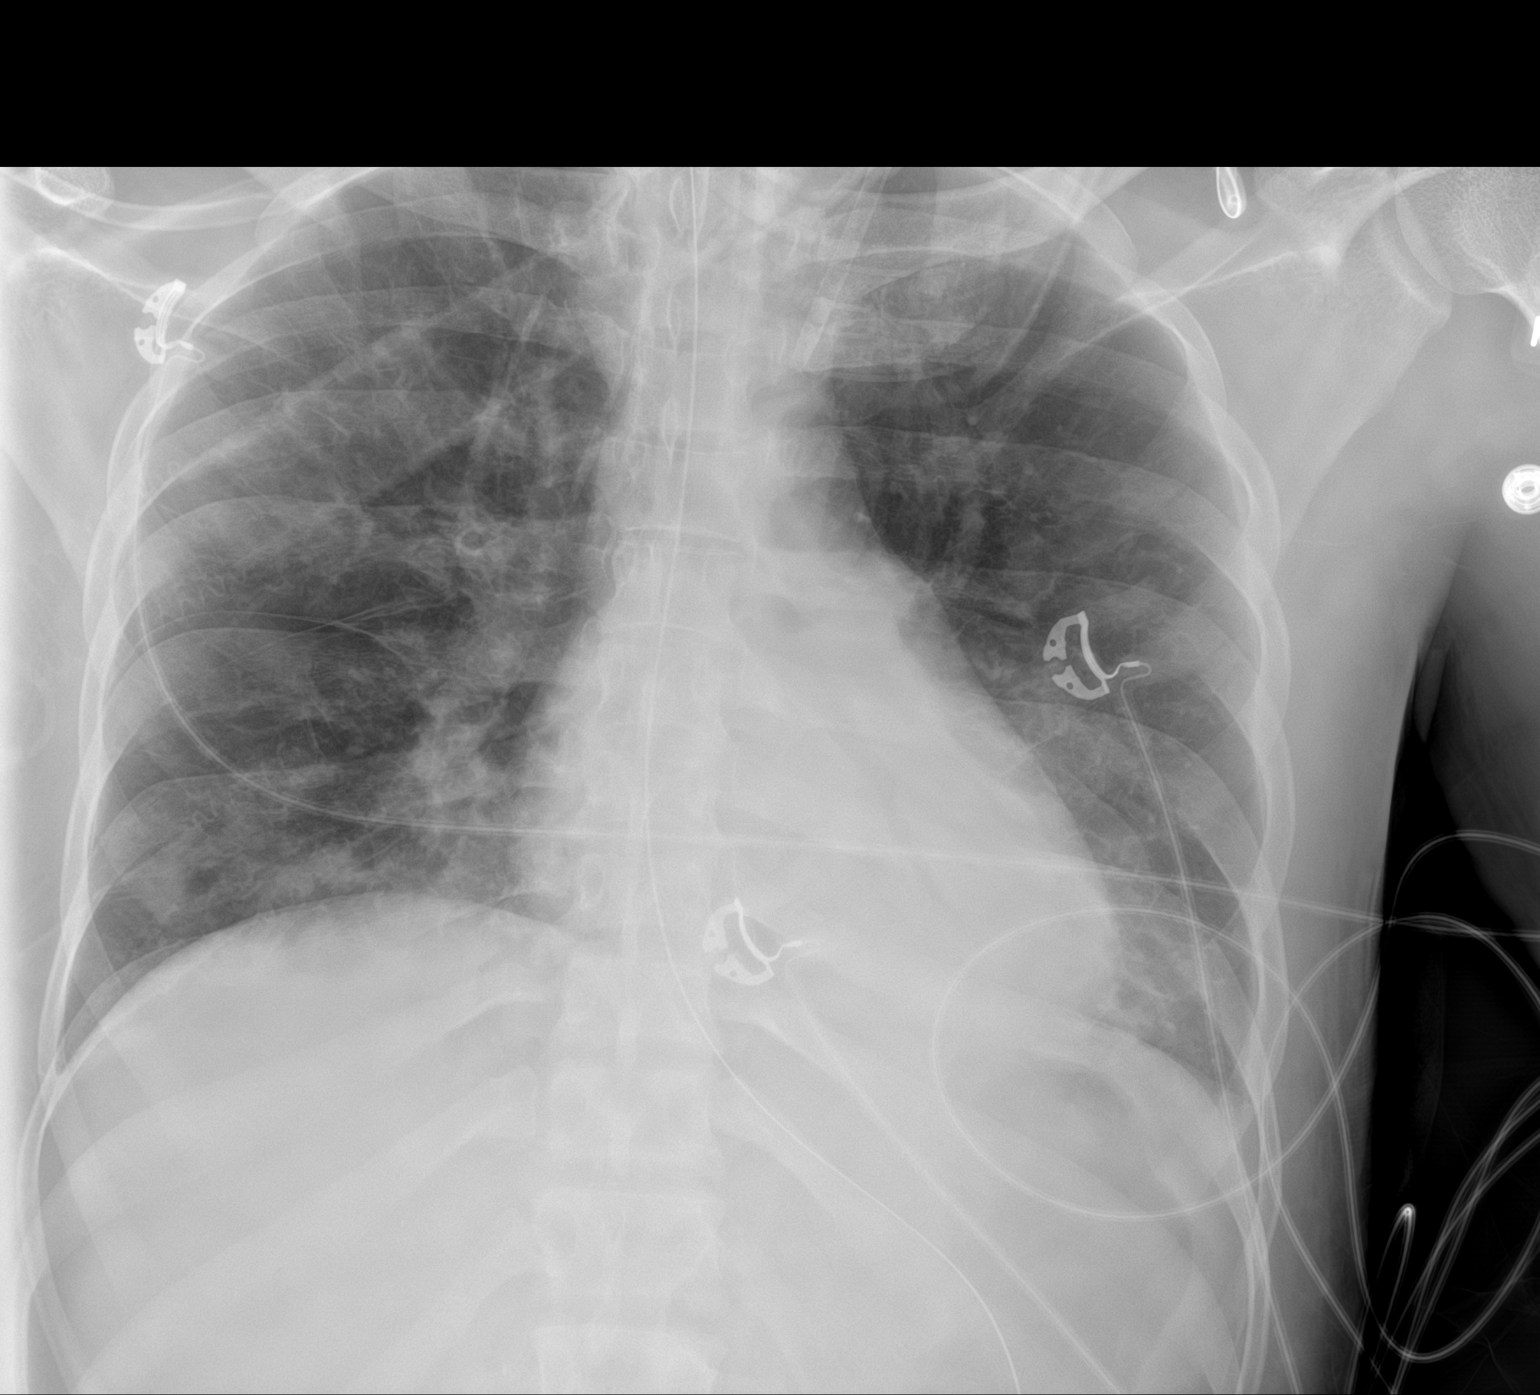

[1 of 1 positions shown; findings below may reference images not displayed]

FINDINGS: [N6] hours. Slight interval increase in patchy airspace disease at
the right base with similar retrocardiac left base
collapse/consolidative opacity. No substantialu pleural effusion.
Endotracheal tube tip is 7.5 cm above the base of the carina. NG
tube noted with distal tip positioned in the stomach. Left IJ sheath
again noted. Left chest tube has been removed in the interval
without evidence for residual pneumothorax.
IMPRESSION: 1. Interval removal of left chest tube without evidence for residual
pneumothorax.
2. Slight interval increase in patchy airspace disease at the right
base.

## 2020-04-03 IMAGING — DX DG CLAVICLE*R*
2 series · 2 of 2 positions shown · non-contrast
Comparison: Intraoperative films from earlier in the same day.

CLINICAL DATA: Status post ORIF of right clavicular fracture

EXAM:
RIGHT CLAVICLE - 2+ VIEWS

[clavicle ap]
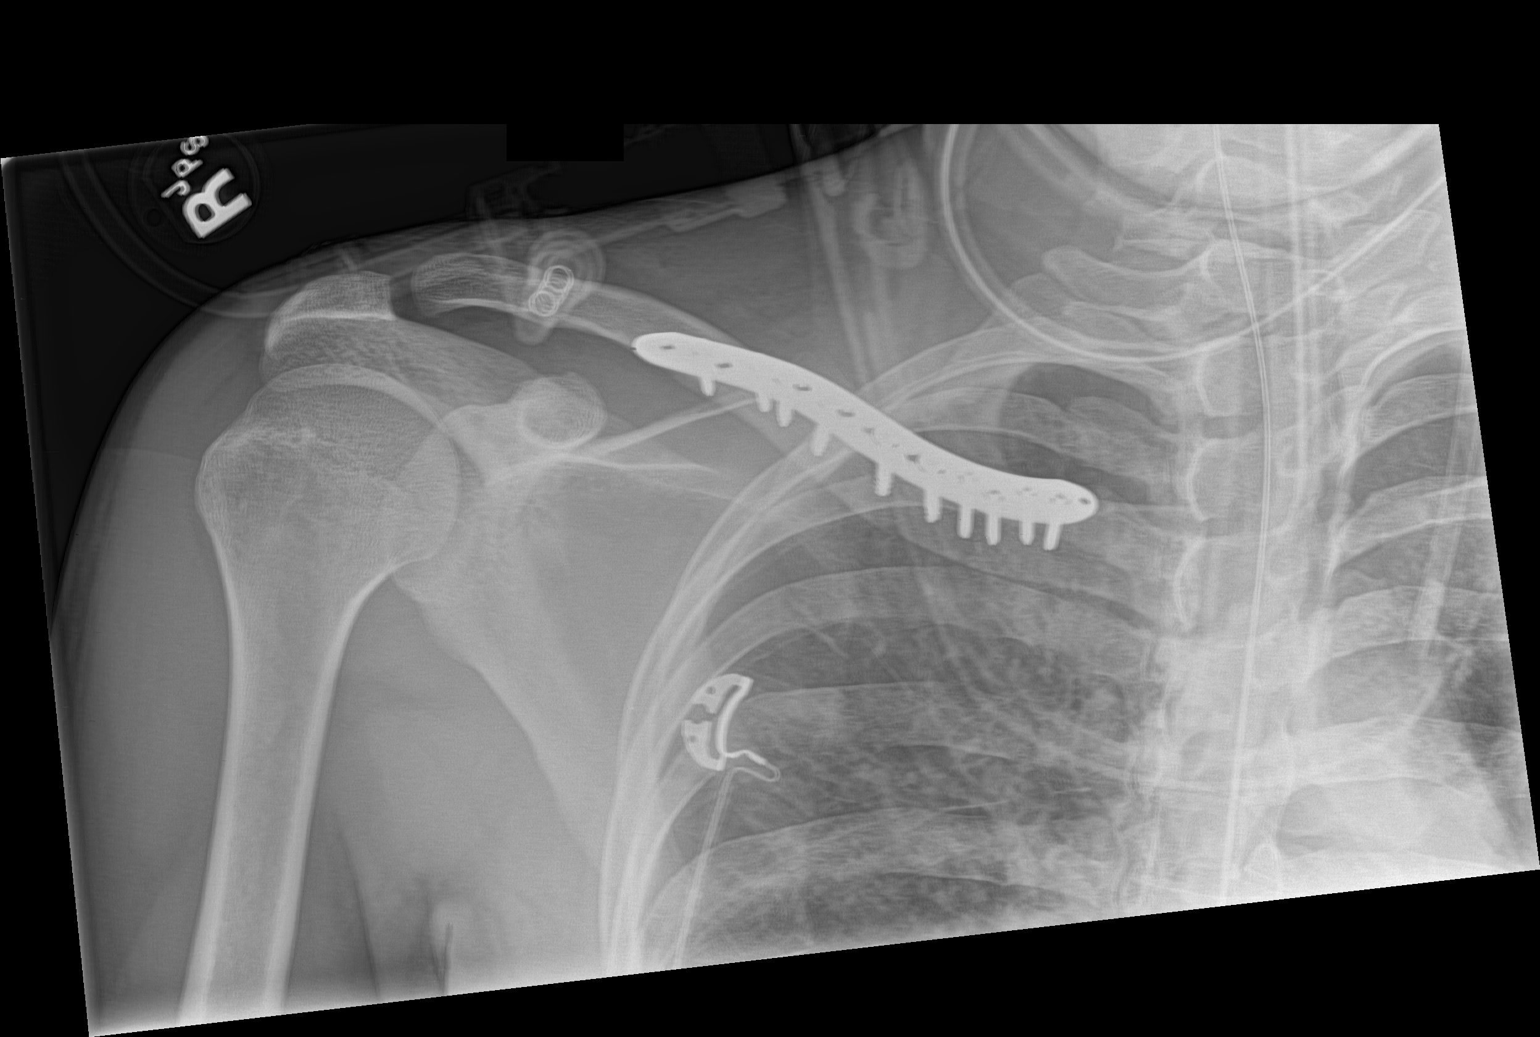

[clavicle axial]
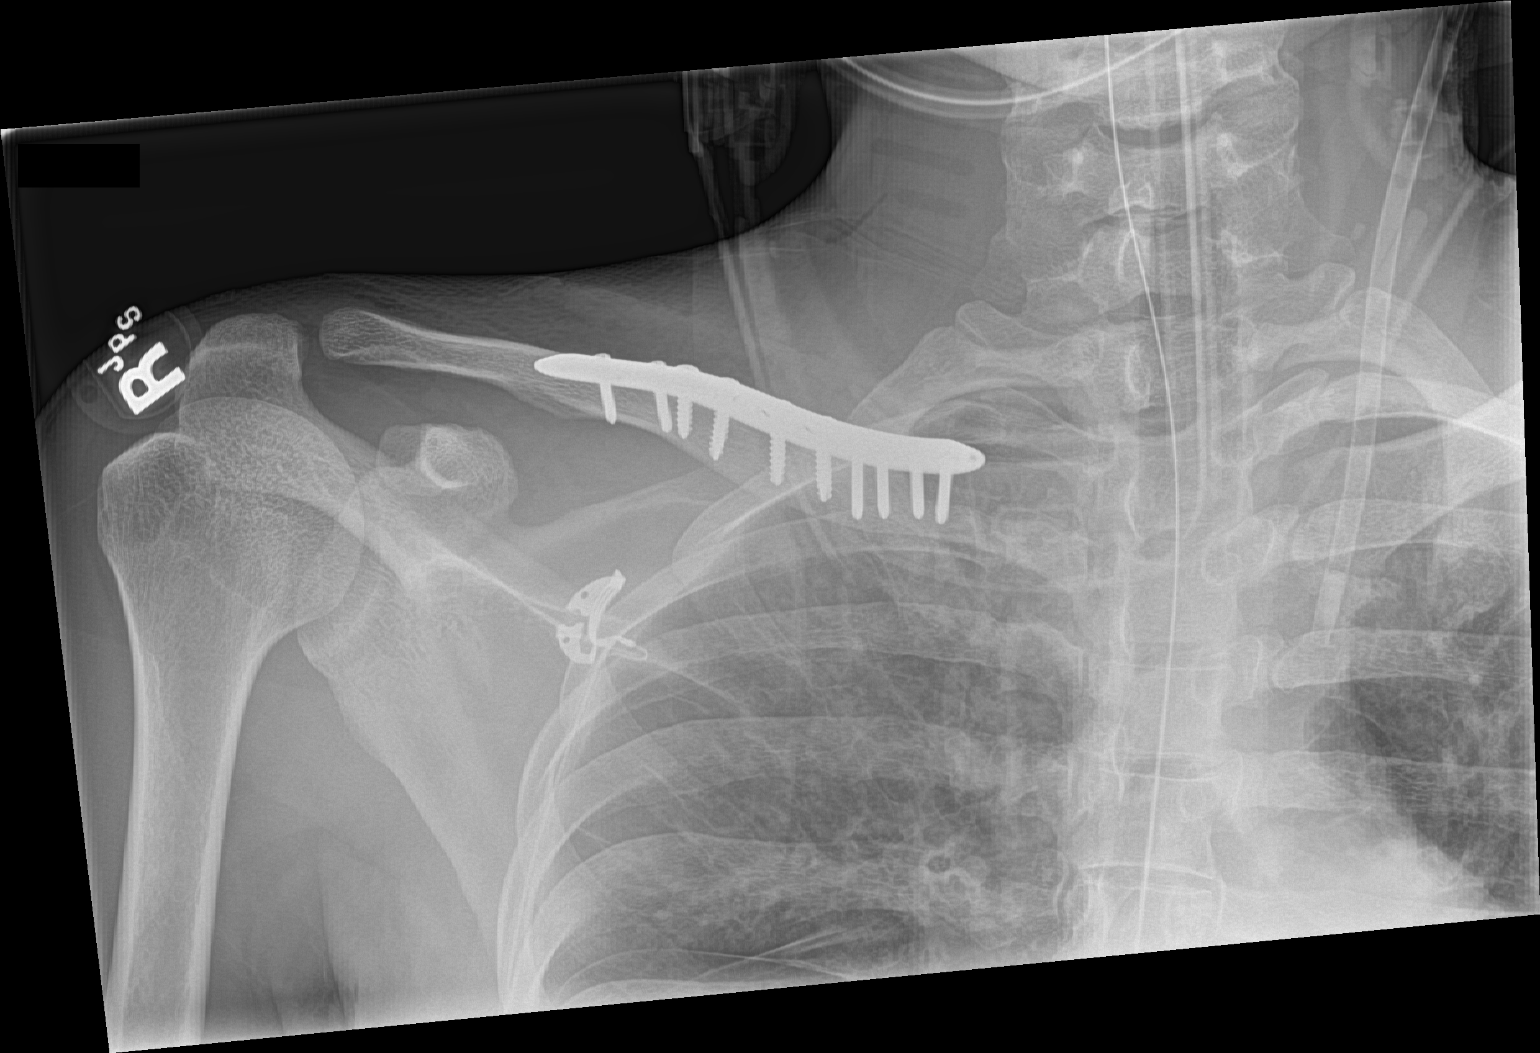

[2 of 2 positions shown; findings below may reference images not displayed]

FINDINGS: Fixation sideplate is seen in the midshaft of the right clavicle.
Clavicle fracture fragments are in anatomic alignment. No soft
tissue abnormality is seen. Endotracheal tube and gastric catheter
are noted.
IMPRESSION: Status post ORIF of right clavicular fracture.

## 2020-04-03 IMAGING — RF DG CLAVICLE*R*
1 series · 7 of 7 positions shown · non-contrast
Comparison: Film from earlier in the same day.

CLINICAL DATA: Right clavicular fracture

EXAM:
DG C-ARM 1-60 MIN; RIGHT CLAVICLE - 2+ VIEWS

[Series 1: run · 7 of 7 slices shown]
[im 1/7]
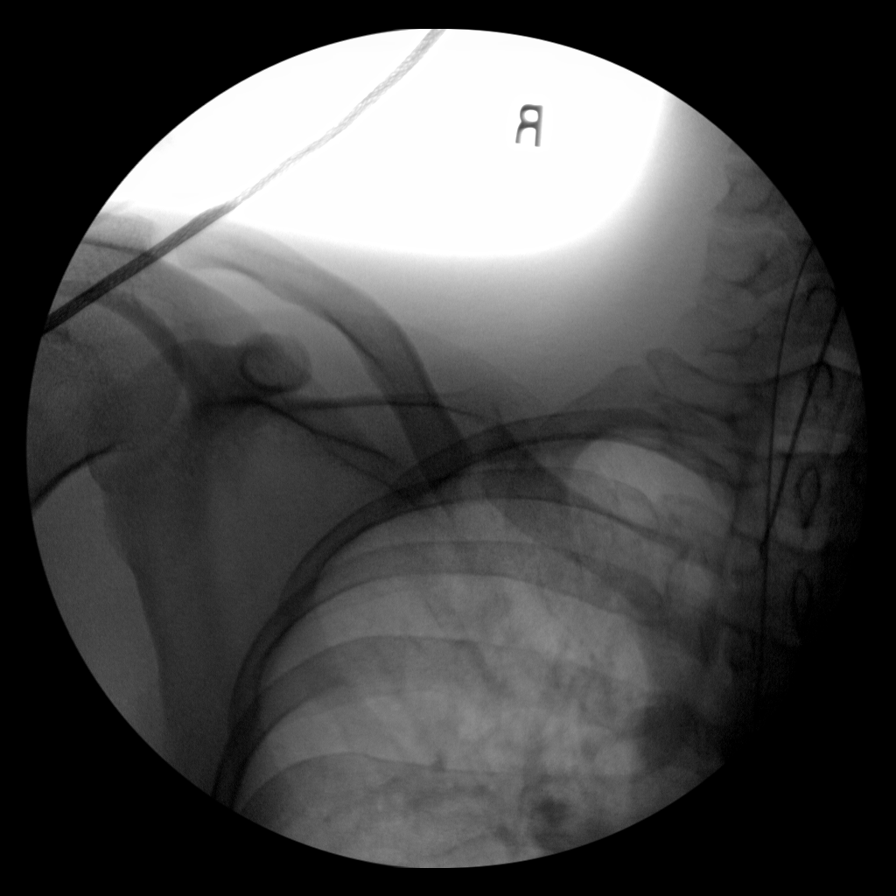
[im 2/7]
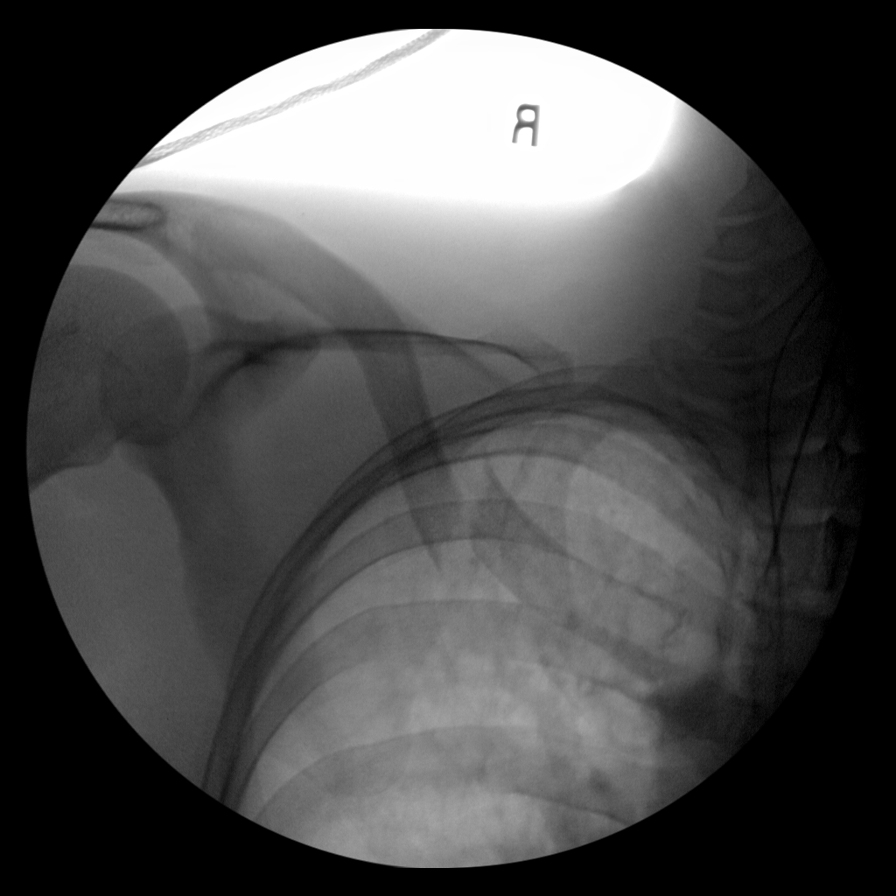
[im 3/7]
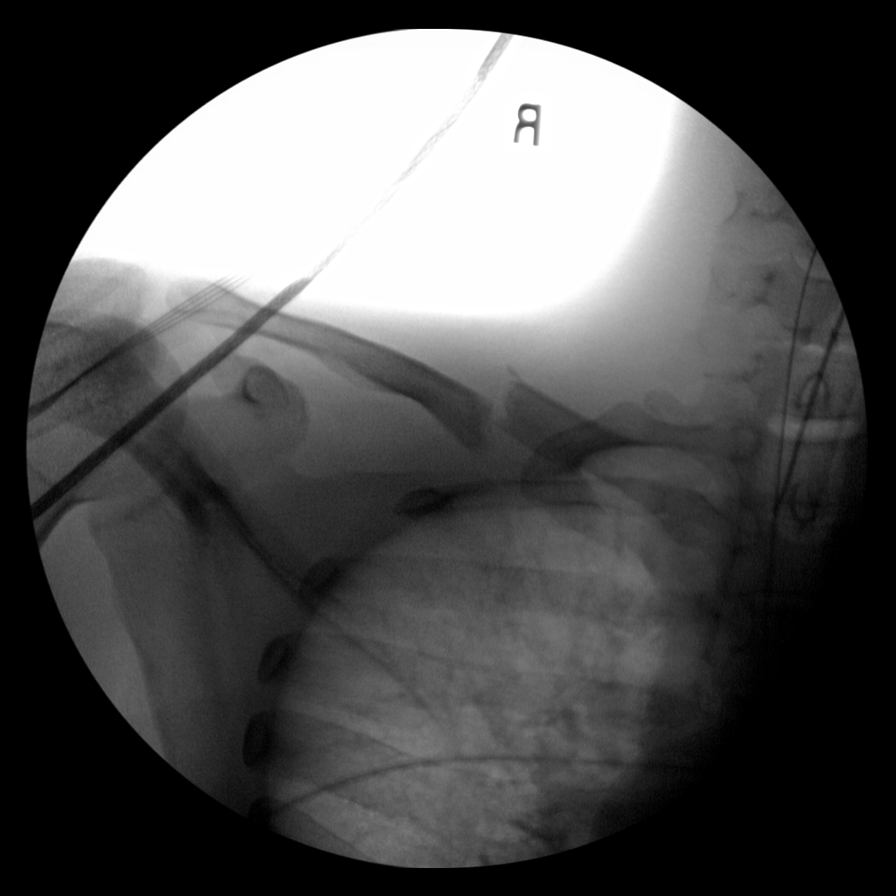
[im 4/7]
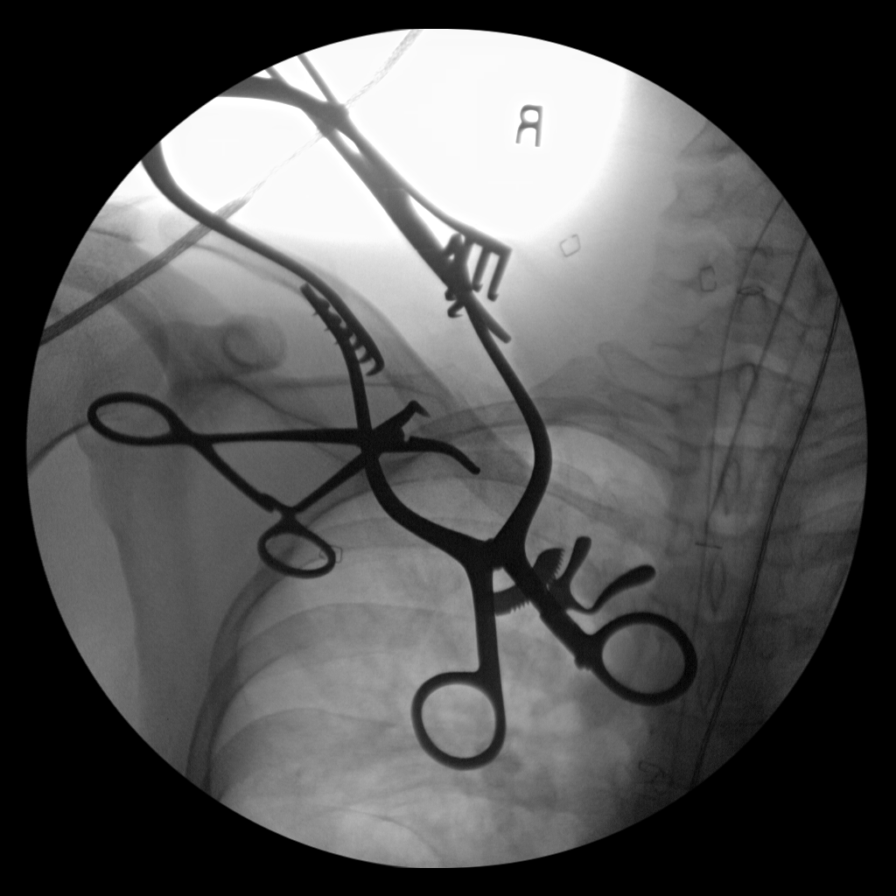
[im 5/7]
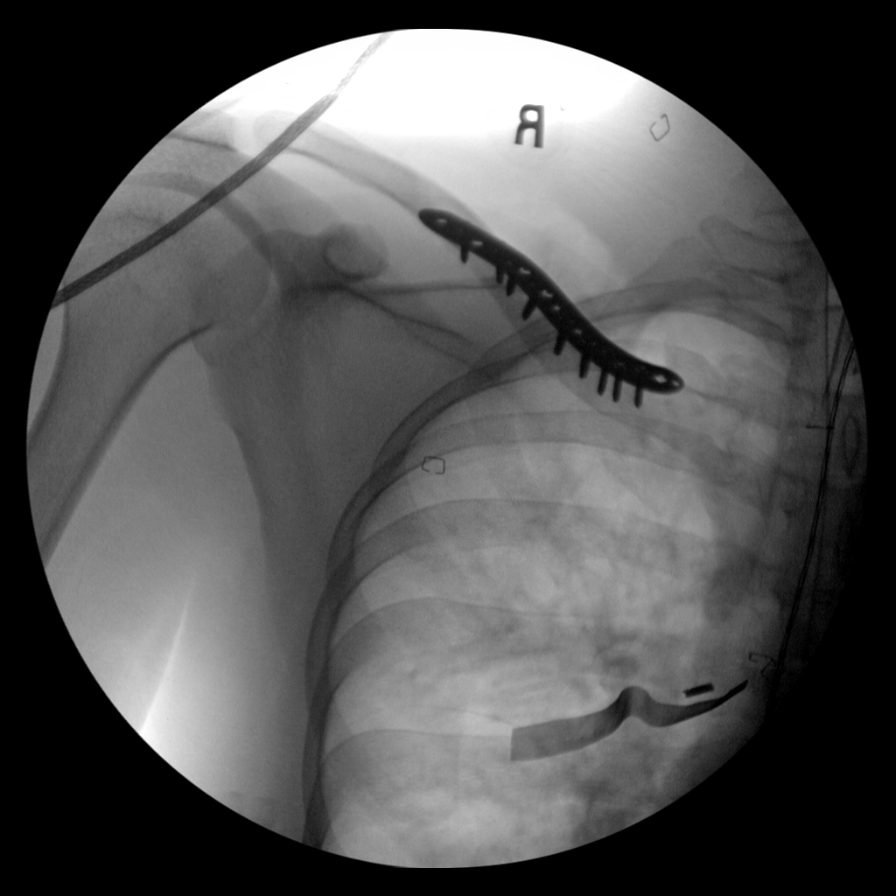
[im 6/7]
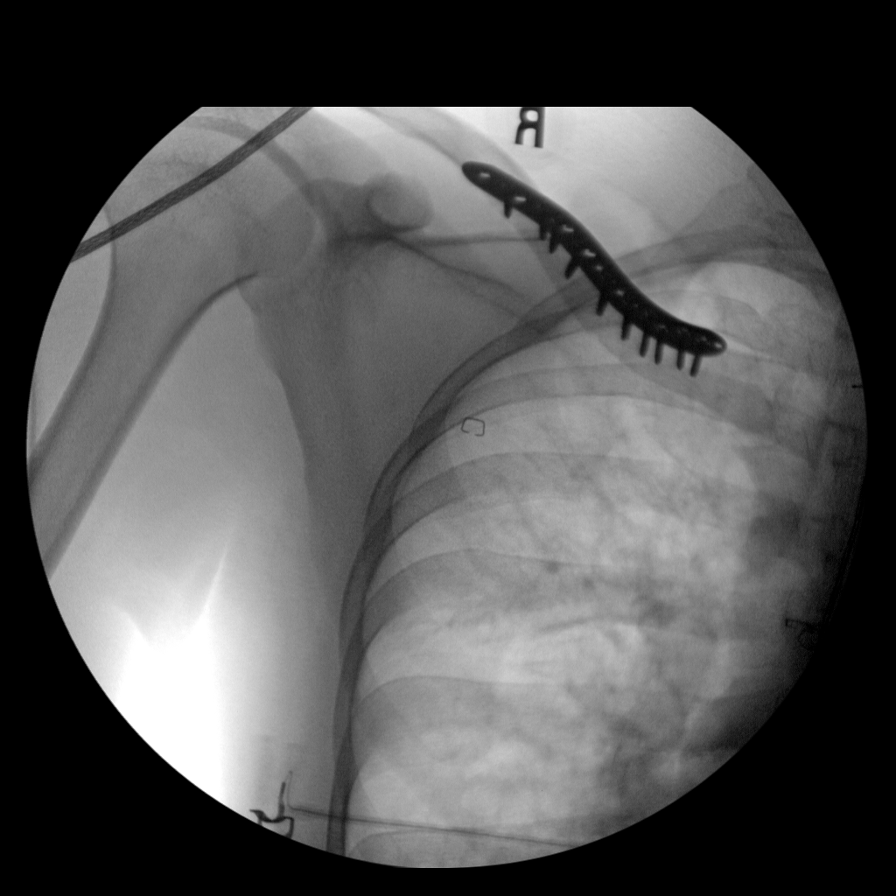
[im 7/7]
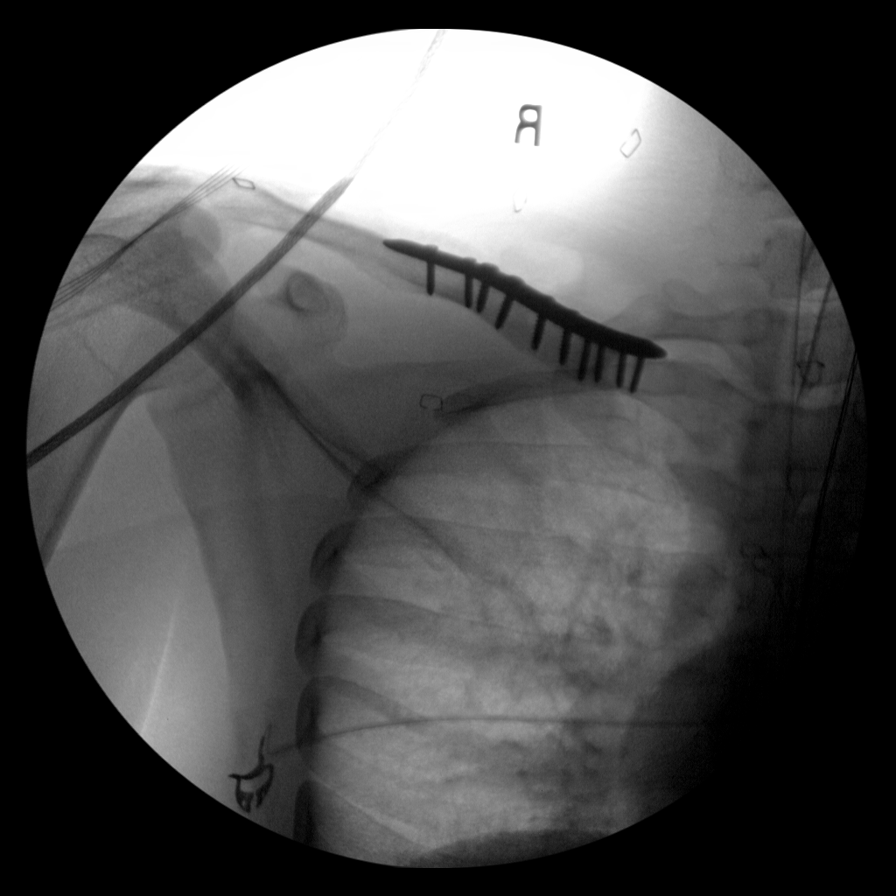

[7 of 7 positions shown; findings below may reference images not displayed]

FLUOROSCOPY TIME:  Fluoroscopy Time:  7 seconds

Radiation Exposure Index (if provided by the fluoroscopic device):
0.67 mGy

Number of Acquired Spot Images: 7
FINDINGS: Initial images again demonstrate the right midshaft clavicular
fracture with mild downward displacement of the distal fracture
fragment. Fracture fragments were aligned and fixation plate placed.
IMPRESSION: ORIF of right clavicular fracture.

## 2020-04-03 SURGERY — OPEN REDUCTION INTERNAL FIXATION (ORIF) CLAVICULAR FRACTURE
Anesthesia: General | Laterality: Right

## 2020-04-03 MED ORDER — POTASSIUM CHLORIDE 20 MEQ PO PACK
40.0000 meq | PACK | Freq: Once | ORAL | Status: AC
  Administered 2020-04-03: 40 meq
  Filled 2020-04-03: qty 2

## 2020-04-03 MED ORDER — SODIUM CHLORIDE 0.9% FLUSH
10.0000 mL | Freq: Two times a day (BID) | INTRAVENOUS | Status: DC
  Administered 2020-04-03: 20 mL
  Administered 2020-04-03: 10 mL
  Administered 2020-04-04: 40 mL
  Administered 2020-04-04: 10 mL
  Administered 2020-04-05: 40 mL
  Administered 2020-04-05 – 2020-04-15 (×21): 10 mL
  Administered 2020-04-16: 20 mL
  Administered 2020-04-17: 10 mL
  Administered 2020-04-17: 20 mL

## 2020-04-03 MED ORDER — PHENYLEPHRINE HCL-NACL 10-0.9 MG/250ML-% IV SOLN
INTRAVENOUS | Status: DC | PRN
  Administered 2020-04-03: 50 ug/min via INTRAVENOUS

## 2020-04-03 MED ORDER — ACETAMINOPHEN 10 MG/ML IV SOLN
INTRAVENOUS | Status: DC | PRN
  Administered 2020-04-03: 1000 mg via INTRAVENOUS

## 2020-04-03 MED ORDER — DEXMEDETOMIDINE HCL IN NACL 200 MCG/50ML IV SOLN
0.4000 ug/kg/h | INTRAVENOUS | Status: DC
  Filled 2020-04-03: qty 50

## 2020-04-03 MED ORDER — LACTATED RINGERS IV SOLN: INTRAVENOUS | Status: DC | PRN

## 2020-04-03 MED ORDER — ROCURONIUM 10MG/ML (10ML) SYRINGE FOR MEDFUSION PUMP - OPTIME
INTRAVENOUS | Status: DC | PRN
  Administered 2020-04-03: 100 mg via INTRAVENOUS

## 2020-04-03 MED ORDER — SUFENTANIL CITRATE 50 MCG/ML IV SOLN
INTRAVENOUS | Status: AC
  Filled 2020-04-03: qty 1

## 2020-04-03 MED ORDER — PROPOFOL 10 MG/ML IV BOLUS
INTRAVENOUS | Status: AC
  Filled 2020-04-03: qty 20

## 2020-04-03 MED ORDER — ALBUMIN HUMAN 5 % IV SOLN: INTRAVENOUS | Status: DC | PRN

## 2020-04-03 MED ORDER — CEFAZOLIN SODIUM-DEXTROSE 2-4 GM/100ML-% IV SOLN
2.0000 g | Freq: Three times a day (TID) | INTRAVENOUS | Status: AC
  Administered 2020-04-03 – 2020-04-04 (×5): 2 g via INTRAVENOUS
  Filled 2020-04-03 (×5): qty 100

## 2020-04-03 MED ORDER — MIDAZOLAM HCL 2 MG/2ML IJ SOLN
INTRAMUSCULAR | Status: AC
  Filled 2020-04-03: qty 2

## 2020-04-03 MED ORDER — MIDAZOLAM HCL 2 MG/2ML IJ SOLN
INTRAMUSCULAR | Status: DC | PRN
  Administered 2020-04-03: 2 mg via INTRAVENOUS

## 2020-04-03 MED ORDER — 0.9 % SODIUM CHLORIDE (POUR BTL) OPTIME
TOPICAL | Status: DC | PRN
  Administered 2020-04-03: 1000 mL

## 2020-04-03 MED ORDER — DOCUSATE SODIUM 50 MG/5ML PO LIQD
100.0000 mg | Freq: Two times a day (BID) | ORAL | Status: DC
  Administered 2020-04-03 – 2020-04-16 (×19): 100 mg
  Filled 2020-04-03 (×17): qty 10

## 2020-04-03 MED ORDER — POLYETHYLENE GLYCOL 3350 17 G PO PACK
17.0000 g | PACK | Freq: Every day | ORAL | Status: DC
  Administered 2020-04-03 – 2020-04-05 (×3): 17 g via ORAL
  Filled 2020-04-03 (×5): qty 1

## 2020-04-03 MED ORDER — VANCOMYCIN HCL 1000 MG IV SOLR
INTRAVENOUS | Status: DC | PRN
  Administered 2020-04-03: 1000 mg

## 2020-04-03 MED ORDER — SODIUM CHLORIDE 0.9% FLUSH: 10.0000 mL | INTRAVENOUS | Status: DC | PRN

## 2020-04-03 MED ORDER — VANCOMYCIN HCL 1000 MG IV SOLR
INTRAVENOUS | Status: AC
  Filled 2020-04-03: qty 1000

## 2020-04-03 SURGICAL SUPPLY — 69 items
ADH SKN CLS APL DERMABOND .7 (GAUZE/BANDAGES/DRESSINGS) ×2
ADH SKN CLS LQ APL DERMABOND (GAUZE/BANDAGES/DRESSINGS) ×1
APL PRP STRL LF DISP 70% ISPRP (MISCELLANEOUS) ×1
BIT DRILL QC 2X140 (BIT) ×1 IMPLANT
BIT DRILL QC SFS 2.5X170 (BIT) ×1 IMPLANT
BNDG COHESIVE 4X5 TAN STRL (GAUZE/BANDAGES/DRESSINGS) IMPLANT
BNDG ELASTIC 3X5.8 VLCR STR LF (GAUZE/BANDAGES/DRESSINGS) ×1 IMPLANT
BNDG ELASTIC 4X5.8 VLCR STR LF (GAUZE/BANDAGES/DRESSINGS) ×1 IMPLANT
BNDG ELASTIC 6X5.8 VLCR STR LF (GAUZE/BANDAGES/DRESSINGS) ×1 IMPLANT
BRUSH SCRUB EZ PLAIN DRY (MISCELLANEOUS) ×3 IMPLANT
CHLORAPREP W/TINT 26 (MISCELLANEOUS) ×2 IMPLANT
COVER SURGICAL LIGHT HANDLE (MISCELLANEOUS) ×4 IMPLANT
DERMABOND ADHESIVE PROPEN (GAUZE/BANDAGES/DRESSINGS) ×1
DERMABOND ADVANCED (GAUZE/BANDAGES/DRESSINGS) ×2
DERMABOND ADVANCED .7 DNX12 (GAUZE/BANDAGES/DRESSINGS) ×2 IMPLANT
DERMABOND ADVANCED .7 DNX6 (GAUZE/BANDAGES/DRESSINGS) IMPLANT
DRAPE C-ARM 42X72 X-RAY (DRAPES) ×2 IMPLANT
DRAPE INCISE IOBAN 66X45 STRL (DRAPES) ×2 IMPLANT
DRAPE ORTHO SPLIT 77X108 STRL (DRAPES) ×4
DRAPE SURG 17X23 STRL (DRAPES) ×3 IMPLANT
DRAPE SURG ORHT 6 SPLT 77X108 (DRAPES) ×2 IMPLANT
DRAPE U-SHAPE 47X51 STRL (DRAPES) ×4 IMPLANT
DRSG MEPILEX BORDER 4X8 (GAUZE/BANDAGES/DRESSINGS) ×2 IMPLANT
ELECT REM PT RETURN 9FT ADLT (ELECTROSURGICAL) ×2
ELECTRODE REM PT RTRN 9FT ADLT (ELECTROSURGICAL) ×1 IMPLANT
GAUZE SPONGE 4X4 12PLY STRL (GAUZE/BANDAGES/DRESSINGS) ×1 IMPLANT
GLOVE BIO SURGEON STRL SZ 6.5 (GLOVE) ×6 IMPLANT
GLOVE BIO SURGEON STRL SZ7.5 (GLOVE) ×6 IMPLANT
GLOVE BIOGEL PI IND STRL 7.5 (GLOVE) ×1 IMPLANT
GLOVE BIOGEL PI INDICATOR 7.5 (GLOVE) ×1
GLOVE SURG UNDER POLY LF SZ6.5 (GLOVE) ×2 IMPLANT
GOWN STRL REUS W/ TWL LRG LVL3 (GOWN DISPOSABLE) ×2 IMPLANT
GOWN STRL REUS W/TWL LRG LVL3 (GOWN DISPOSABLE) ×4
KIT BASIN OR (CUSTOM PROCEDURE TRAY) ×2 IMPLANT
KIT TURNOVER KIT B (KITS) ×2 IMPLANT
MANIFOLD NEPTUNE II (INSTRUMENTS) ×2 IMPLANT
NDL HYPO 25GX1X1/2 BEV (NEEDLE) IMPLANT
NEEDLE HYPO 25GX1X1/2 BEV (NEEDLE) IMPLANT
NS IRRIG 1000ML POUR BTL (IV SOLUTION) ×2 IMPLANT
PACK GENERAL/GYN (CUSTOM PROCEDURE TRAY) ×2 IMPLANT
PAD ABD 7.5X8 STRL (GAUZE/BANDAGES/DRESSINGS) ×1 IMPLANT
PAD ARMBOARD 7.5X6 YLW CONV (MISCELLANEOUS) ×4 IMPLANT
PADDING CAST ABS 6INX4YD NS (CAST SUPPLIES) ×1
PADDING CAST ABS COTTON 6X4 NS (CAST SUPPLIES) IMPLANT
PADDING CAST SYNTHETIC 4 (CAST SUPPLIES) ×1
PADDING CAST SYNTHETIC 4X4 STR (CAST SUPPLIES) IMPLANT
PLATE VA LCP CS2 RT (Plate) ×1 IMPLANT
PLATE VA-LCP CS1 RT (Plate) ×1 IMPLANT
SCREW CORTEX 2.7X14 (Screw) ×2 IMPLANT
SCREW CORTEX 3.5 14MM (Screw) ×1 IMPLANT
SCREW CORTEX 3.5 16MM (Screw) ×1 IMPLANT
SCREW LOCK CORT ST 3.5X14 (Screw) IMPLANT
SCREW LOCK CORT ST 3.5X16 (Screw) IMPLANT
SCREW METAPHYSCAL 18MM (Screw) ×1 IMPLANT
SCREW NON LOCK 2.7X16MM (Screw) ×2 IMPLANT
SLING ARM IMMOBILIZER LRG (SOFTGOODS) IMPLANT
SLING ARM IMMOBILIZER MED (SOFTGOODS) IMPLANT
STAPLER VISISTAT 35W (STAPLE) ×2 IMPLANT
STOCKINETTE IMPERVIOUS 9X36 MD (GAUZE/BANDAGES/DRESSINGS) IMPLANT
SUCTION FRAZIER HANDLE 10FR (MISCELLANEOUS) ×2
SUCTION TUBE FRAZIER 10FR DISP (MISCELLANEOUS) ×1 IMPLANT
SUT MNCRL AB 3-0 PS2 27 (SUTURE) ×2 IMPLANT
SUT VIC AB 0 CT1 27 (SUTURE) ×2
SUT VIC AB 0 CT1 27XBRD ANBCTR (SUTURE) ×1 IMPLANT
SUT VIC AB 2-0 CT1 27 (SUTURE) ×2
SUT VIC AB 2-0 CT1 TAPERPNT 27 (SUTURE) ×1 IMPLANT
SYR CONTROL 10ML LL (SYRINGE) IMPLANT
TOWEL GREEN STERILE (TOWEL DISPOSABLE) ×2 IMPLANT
WATER STERILE IRR 1000ML POUR (IV SOLUTION) ×2 IMPLANT

## 2020-04-03 NOTE — Transfer of Care (Signed)
Immediate Anesthesia Transfer of Care Note  Patient: Jeremy George  Procedure(s) Performed: OPEN REDUCTION INTERNAL FIXATION (ORIF) CLAVICULAR FRACTURE (Right )  Patient Location: SICU  Anesthesia Type:General  Level of Consciousness: sedated and Patient remains intubated per anesthesia plan  Airway & Oxygen Therapy: Patient remains intubated per anesthesia plan and Patient placed on Ventilator (see vital sign flow sheet for setting)  Post-op Assessment: Report given to RN and Post -op Vital signs reviewed and stable  Post vital signs: Reviewed and stable  Last Vitals:  Vitals Value Taken Time  BP 91/50 04/03/20 0931  Temp    Pulse 64 04/03/20 0941  Resp 24 04/03/20 0941  SpO2 100 % 04/03/20 0941  Vitals shown include unvalidated device data.  Last Pain:  Vitals:   04/03/20 0400  TempSrc: Oral         Complications: No complications documented.

## 2020-04-03 NOTE — Progress Notes (Signed)
Peripherally Inserted Central Catheter Placement  The IV Nurse has discussed with the patient and/or persons authorized to consent for the patient, the purpose of this procedure and the potential benefits and risks involved with this procedure.  The benefits include less needle sticks, lab draws from the catheter, and the patient may be discharged home with the catheter. Risks include, but not limited to, infection, bleeding, blood clot (thrombus formation), and puncture of an artery; nerve damage and irregular heartbeat and possibility to perform a PICC exchange if needed/ordered by physician.  Alternatives to this procedure were also discussed.  Bard Power PICC patient education guide, fact sheet on infection prevention and patient information card has been provided to patient /or left at bedside.    PICC Placement Documentation  PICC Triple Lumen 04/03/20 Right Brachial 40 cm 1 cm (Active)  Site Assessment Clean;Dry;Intact 04/03/20 1428  Lumen #1 Status Blood return noted;Flushed;Saline locked 04/03/20 1428  Lumen #2 Status Blood return noted;Flushed;Saline locked 04/03/20 1428  Lumen #3 Status Flushed;Blood return noted;Saline locked 04/03/20 1428  Dressing Type Transparent;Securing device 04/03/20 1428  Dressing Status Clean;Dry;Intact 04/03/20 1428  Antimicrobial disc in place? Yes 04/03/20 1428  Safety Lock Not Applicable 04/03/20 1428  Dressing Change Due 04/10/20 04/03/20 1428       Romie Jumper 04/03/2020, 2:30 PM

## 2020-04-03 NOTE — Progress Notes (Signed)
   Providing Compassionate, Quality Care - Together  NEUROSURGERY PROGRESS NOTE   S: No issues overnight. Back from OR now  O: EXAM:  BP 116/62   Pulse 76   Temp 99.4 F (37.4 C) (Oral)   Resp (!) 24   Ht 6' (1.829 m)   Wt 87.3 kg   SpO2 99%   BMI 26.10 kg/m   Intubated Sedated Eyes closed  PERRL Collar in place Min motor response to pain, LUE/LLE dressing/wrapped  ASSESSMENT:  25 y.o. male with   1. DAI/TBI  PLAN: - continue supportive care, does wake up but is heavily sedated - MRI brain/C spine for eval of injury as cannot clear his neck, able to obtain now that ex fix devices are removed - dvt ppx  - further recs once mri completed    Thank you for allowing me to participate in this patient's care.  Please do not hesitate to call with questions or concerns.   Monia Pouch, DO Neurosurgeon [facility] Cell: [phone]
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
## 2020-04-03 NOTE — Interval H&P Note (Signed)
History and Physical Interval Note:  04/03/2020 7:23 AM  Jeremy George  has presented today for surgery, with the diagnosis of Right clavicle fracture.  The various methods of treatment have been discussed with the patient and family. After consideration of risks, benefits and other options for treatment, the patient has consented to  Procedure(s): OPEN REDUCTION INTERNAL FIXATION (ORIF) CLAVICULAR FRACTURE (Right) as a surgical intervention.  The patient's history has been reviewed, patient examined, no change in status, stable for surgery.  I have reviewed the patient's chart and labs.  Questions were answered to the patient's satisfaction.     Caryn Bee P Saud Bail

## 2020-04-03 NOTE — Anesthesia Postprocedure Evaluation (Signed)
Anesthesia Post Note  Patient: Jeremy George  Procedure(s) Performed: OPEN REDUCTION INTERNAL FIXATION (ORIF) CLAVICULAR FRACTURE (Right )     Patient location during evaluation: SICU Anesthesia Type: General Level of consciousness: sedated Pain management: pain level controlled Vital Signs Assessment: post-procedure vital signs reviewed and stable Respiratory status: patient remains intubated per anesthesia plan Cardiovascular status: stable Postop Assessment: no apparent nausea or vomiting Anesthetic complications: no   No complications documented.  Last Vitals:  Vitals:   04/03/20 0700 04/03/20 0723  BP: 115/67 116/62  Pulse: 70 76  Resp: (!) 24 (!) 24  Temp:    SpO2: 100% 99%    Last Pain:  Vitals:   04/03/20 0400  TempSrc: Oral                 Lucretia Kern

## 2020-04-03 NOTE — Addendum Note (Signed)
Addendum  created 04/03/20 1107 by Elliot Dally, CRNA   Intraprocedure Staff edited
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
## 2020-04-04 ENCOUNTER — Inpatient Hospital Stay (HOSPITAL_COMMUNITY): Payer: Medicaid Other

## 2020-04-04 LAB — CULTURE, BLOOD (ROUTINE X 2)
Culture: NO GROWTH
Culture: NO GROWTH
Special Requests: ADEQUATE

## 2020-04-04 LAB — BASIC METABOLIC PANEL
Anion gap: 8 (ref 5–15)
BUN: 12 mg/dL (ref 6–20)
CO2: 22 mmol/L (ref 22–32)
Calcium: 7.9 mg/dL — ABNORMAL LOW (ref 8.9–10.3)
Chloride: 111 mmol/L (ref 98–111)
Creatinine, Ser: 0.78 mg/dL (ref 0.61–1.24)
GFR, Estimated: >60 mL/min (ref >60)
Glucose, Bld: 123 mg/dL — ABNORMAL HIGH (ref 70–99)
Potassium: 3.4 mmol/L — ABNORMAL LOW (ref 3.5–5.1)
Sodium: 141 mmol/L (ref 135–145)

## 2020-04-04 LAB — CBC
HCT: 26.3 % — ABNORMAL LOW (ref 39.0–52.0)
Hemoglobin: 8.7 g/dL — ABNORMAL LOW (ref 13.0–17.0)
MCH: 29.4 pg (ref 26.0–34.0)
MCHC: 33.1 g/dL (ref 30.0–36.0)
MCV: 88.9 fL (ref 80.0–100.0)
Platelets: 192 10*3/uL (ref 150–400)
RBC: 2.96 MIL/uL — ABNORMAL LOW (ref 4.22–5.81)
RDW: 14.7 % (ref 11.5–15.5)
WBC: 10.8 10*3/uL — ABNORMAL HIGH (ref 4.0–10.5)
nRBC: 0 % (ref 0.0–0.2)

## 2020-04-04 LAB — GLUCOSE, CAPILLARY
Glucose-Capillary: 105 mg/dL — ABNORMAL HIGH (ref 70–99)
Glucose-Capillary: 131 mg/dL — ABNORMAL HIGH (ref 70–99)
Glucose-Capillary: 132 mg/dL — ABNORMAL HIGH (ref 70–99)
Glucose-Capillary: 146 mg/dL — ABNORMAL HIGH (ref 70–99)
Glucose-Capillary: 150 mg/dL — ABNORMAL HIGH (ref 70–99)
Glucose-Capillary: 151 mg/dL — ABNORMAL HIGH (ref 70–99)
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
## 2020-04-04 MED ORDER — POTASSIUM CHLORIDE 20 MEQ PO PACK
20.0000 meq | PACK | Freq: Once | ORAL | Status: AC
  Administered 2020-04-04: 20 meq
  Filled 2020-04-04: qty 1

## 2020-04-04 MED ORDER — BISACODYL 10 MG RE SUPP
10.0000 mg | Freq: Every day | RECTAL | Status: DC | PRN
  Administered 2020-04-07: 10 mg via RECTAL
  Filled 2020-04-04 (×2): qty 1

## 2020-04-04 MED ORDER — BISACODYL 10 MG RE SUPP
10.0000 mg | Freq: Once | RECTAL | Status: AC
  Administered 2020-04-04: 10 mg via RECTAL
  Filled 2020-04-04: qty 1
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
## 2020-04-05 LAB — GLUCOSE, CAPILLARY
Glucose-Capillary: 123 mg/dL — ABNORMAL HIGH (ref 70–99)
Glucose-Capillary: 126 mg/dL — ABNORMAL HIGH (ref 70–99)
Glucose-Capillary: 132 mg/dL — ABNORMAL HIGH (ref 70–99)
Glucose-Capillary: 138 mg/dL — ABNORMAL HIGH (ref 70–99)
Glucose-Capillary: 144 mg/dL — ABNORMAL HIGH (ref 70–99)
Glucose-Capillary: 162 mg/dL — ABNORMAL HIGH (ref 70–99)

## 2020-04-05 LAB — CBC
HCT: 25.8 % — ABNORMAL LOW (ref 39.0–52.0)
Hemoglobin: 8.7 g/dL — ABNORMAL LOW (ref 13.0–17.0)
MCH: 29.9 pg (ref 26.0–34.0)
MCHC: 33.7 g/dL (ref 30.0–36.0)
MCV: 88.7 fL (ref 80.0–100.0)
Platelets: 263 10*3/uL (ref 150–400)
RBC: 2.91 MIL/uL — ABNORMAL LOW (ref 4.22–5.81)
RDW: 15.1 % (ref 11.5–15.5)
WBC: 12.2 10*3/uL — ABNORMAL HIGH (ref 4.0–10.5)
nRBC: 0 % (ref 0.0–0.2)

## 2020-04-05 LAB — BASIC METABOLIC PANEL
Anion gap: 8 (ref 5–15)
BUN: 14 mg/dL (ref 6–20)
CO2: 24 mmol/L (ref 22–32)
Calcium: 7.7 mg/dL — ABNORMAL LOW (ref 8.9–10.3)
Chloride: 110 mmol/L (ref 98–111)
Creatinine, Ser: 0.73 mg/dL (ref 0.61–1.24)
GFR, Estimated: >60 mL/min (ref >60)
Glucose, Bld: 154 mg/dL — ABNORMAL HIGH (ref 70–99)
Potassium: 3.6 mmol/L (ref 3.5–5.1)
Sodium: 142 mmol/L (ref 135–145)

## 2020-04-05 LAB — TRIGLYCERIDES: Triglycerides: 344 mg/dL — ABNORMAL HIGH (ref <150)

## 2020-04-05 MED ORDER — ENOXAPARIN SODIUM 30 MG/0.3ML ~~LOC~~ SOLN
30.0000 mg | Freq: Two times a day (BID) | SUBCUTANEOUS | Status: DC
Start: 2020-04-05 — End: 2020-04-27
  Administered 2020-04-05 – 2020-04-27 (×43): 30 mg via SUBCUTANEOUS
  Filled 2020-04-05 (×42): qty 0.3

## 2020-04-05 NOTE — Progress Notes (Signed)
Witnessed patient's mother providing informed consent for trach/PEG placement tomorrow by Dr. Bedelia Person.   Verified that blood consent had already been completed in preparation for tomorrow's procedures.  Placed forms in chart.
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
## 2020-04-06 ENCOUNTER — Inpatient Hospital Stay (HOSPITAL_COMMUNITY): Payer: Medicaid Other | Admitting: Certified Registered Nurse Anesthetist

## 2020-04-06 ENCOUNTER — Encounter (HOSPITAL_COMMUNITY): Admission: EM | Disposition: A | Discharge status: Rehabilitation Facility | Payer: Self-pay | Source: Home / Self Care

## 2020-04-06 ENCOUNTER — Inpatient Hospital Stay (HOSPITAL_COMMUNITY): Payer: Medicaid Other

## 2020-04-06 DIAGNOSIS — S0240BB Malar fracture, left side, initial encounter for open fracture: Secondary | ICD-10-CM

## 2020-04-06 DIAGNOSIS — S022XXA Fracture of nasal bones, initial encounter for closed fracture: Secondary | ICD-10-CM

## 2020-04-06 DIAGNOSIS — S02412B LeFort II fracture, initial encounter for open fracture: Secondary | ICD-10-CM

## 2020-04-06 HISTORY — PX: ORIF ORBITAL FRACTURE: SHX5312

## 2020-04-06 HISTORY — PX: TRACHEOSTOMY TUBE PLACEMENT: SHX814

## 2020-04-06 HISTORY — PX: PEG PLACEMENT: SHX5437

## 2020-04-06 HISTORY — PX: ESOPHAGOGASTRODUODENOSCOPY: SHX5428

## 2020-04-06 HISTORY — PX: ORIF NASAL FRACTURE: SHX5359

## 2020-04-06 HISTORY — PX: ORIF MANDIBULAR FRACTURE: SHX2127

## 2020-04-06 LAB — CBC
HCT: 27.1 % — ABNORMAL LOW (ref 39.0–52.0)
Hemoglobin: 9.1 g/dL — ABNORMAL LOW (ref 13.0–17.0)
MCH: 30.2 pg (ref 26.0–34.0)
MCHC: 33.6 g/dL (ref 30.0–36.0)
MCV: 90 fL (ref 80.0–100.0)
Platelets: 363 10*3/uL (ref 150–400)
RBC: 3.01 MIL/uL — ABNORMAL LOW (ref 4.22–5.81)
RDW: 15.3 % (ref 11.5–15.5)
WBC: 15.5 10*3/uL — ABNORMAL HIGH (ref 4.0–10.5)
nRBC: 0 % (ref 0.0–0.2)

## 2020-04-06 LAB — BASIC METABOLIC PANEL
Anion gap: 9 (ref 5–15)
BUN: 16 mg/dL (ref 6–20)
CO2: 24 mmol/L (ref 22–32)
Calcium: 8 mg/dL — ABNORMAL LOW (ref 8.9–10.3)
Chloride: 109 mmol/L (ref 98–111)
Creatinine, Ser: 0.62 mg/dL (ref 0.61–1.24)
GFR, Estimated: >60 mL/min (ref >60)
Glucose, Bld: 119 mg/dL — ABNORMAL HIGH (ref 70–99)
Potassium: 3.8 mmol/L (ref 3.5–5.1)
Sodium: 142 mmol/L (ref 135–145)

## 2020-04-06 LAB — GLUCOSE, CAPILLARY
Glucose-Capillary: 103 mg/dL — ABNORMAL HIGH (ref 70–99)
Glucose-Capillary: 83 mg/dL (ref 70–99)
Glucose-Capillary: 99 mg/dL (ref 70–99)
Glucose-Capillary: 97 mg/dL (ref 70–99)

## 2020-04-06 LAB — TRIGLYCERIDES: Triglycerides: 427 mg/dL — ABNORMAL HIGH (ref <150)

## 2020-04-06 LAB — TYPE AND SCREEN
ABO/RH(D): O POS
Antibody Screen: NEGATIVE

## 2020-04-06 IMAGING — DX DG CHEST 1V PORT
1 series · 1 of 1 positions shown · non-contrast
Comparison: [DATE]

CLINICAL DATA: Respiratory failure.

EXAM:
PORTABLE CHEST 1 VIEW

[chest ap]
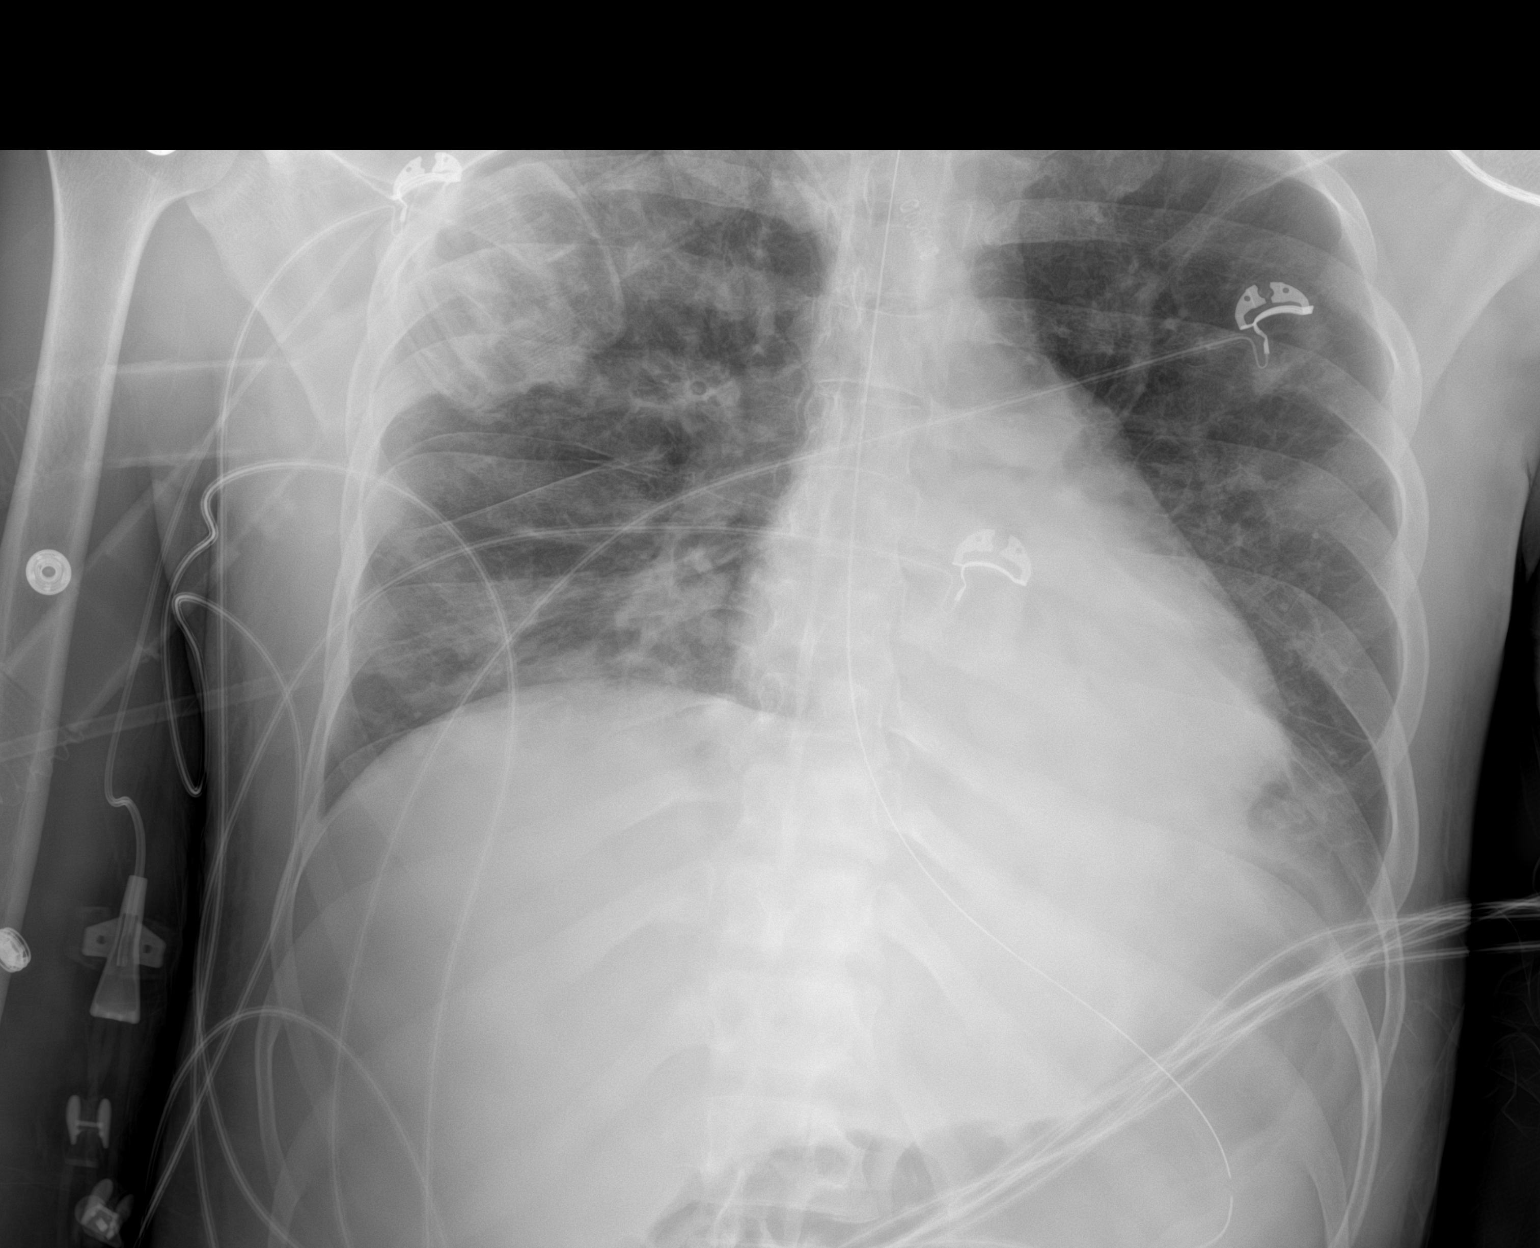

[1 of 1 positions shown; findings below may reference images not displayed]

FINDINGS: Gastric tube courses below the diaphragm with the tip outside of the
field of view. Endotracheal tube tip is at the level of clavicular
heads. Low lung volumes. Similar versus slightly improved left
greater than right bibasilar opacities, more confluent at the left
lung base. No visible pleural effusions or pneumothorax.
Cardiomediastinal silhouette is within normal limits. Partially
imaged right clavicular fixation.
IMPRESSION: Similar versus slightly improved left greater than right opacities
(more confluent at the left lung base), concerning for pneumonia. A
component of left lower lobe atelectasis/collapse is possible.
Recommend imaging follow-up to resolution.

## 2020-04-06 SURGERY — EGD (ESOPHAGOGASTRODUODENOSCOPY)
Anesthesia: Moderate Sedation

## 2020-04-06 SURGERY — OPEN REDUCTION INTERNAL FIXATION (ORIF) MANDIBULAR FRACTURE
Anesthesia: General | Site: Nose

## 2020-04-06 MED ORDER — LIDOCAINE-EPINEPHRINE 1 %-1:100000 IJ SOLN
INTRAMUSCULAR | Status: DC | PRN
  Administered 2020-04-06: 30 mL

## 2020-04-06 MED ORDER — ONDANSETRON HCL 4 MG/2ML IJ SOLN
INTRAMUSCULAR | Status: AC
  Filled 2020-04-06: qty 2

## 2020-04-06 MED ORDER — ROCURONIUM BROMIDE 10 MG/ML (PF) SYRINGE
PREFILLED_SYRINGE | INTRAVENOUS | Status: AC
  Filled 2020-04-06: qty 10

## 2020-04-06 MED ORDER — 0.9 % SODIUM CHLORIDE (POUR BTL) OPTIME
TOPICAL | Status: DC | PRN
  Administered 2020-04-06: 1000 mL

## 2020-04-06 MED ORDER — MIDAZOLAM HCL 2 MG/2ML IJ SOLN
20.0000 mg | Freq: Once | INTRAMUSCULAR | Status: AC
  Administered 2020-04-06: 4 mg via INTRAVENOUS
  Filled 2020-04-06: qty 20

## 2020-04-06 MED ORDER — ROCURONIUM BROMIDE 100 MG/10ML IV SOLN
INTRAVENOUS | Status: DC | PRN
  Administered 2020-04-06: 50 mg via INTRAVENOUS
  Administered 2020-04-06: 40 mg via INTRAVENOUS
  Administered 2020-04-06: 60 mg via INTRAVENOUS
  Administered 2020-04-06: 40 mg via INTRAVENOUS

## 2020-04-06 MED ORDER — FENTANYL BOLUS VIA INFUSION
100.0000 ug | INTRAVENOUS | Status: DC | PRN
  Administered 2020-04-06 (×2): 100 ug via INTRAVENOUS
  Filled 2020-04-06: qty 100

## 2020-04-06 MED ORDER — OXYMETAZOLINE HCL 0.05 % NA SOLN
NASAL | Status: AC
  Filled 2020-04-06: qty 30

## 2020-04-06 MED ORDER — MIDAZOLAM HCL 2 MG/2ML IJ SOLN
INTRAMUSCULAR | Status: AC
  Filled 2020-04-06: qty 2

## 2020-04-06 MED ORDER — FENTANYL CITRATE (PF) 250 MCG/5ML IJ SOLN
INTRAMUSCULAR | Status: AC
  Filled 2020-04-06: qty 5

## 2020-04-06 MED ORDER — PROPOFOL 1000 MG/100ML IV EMUL
INTRAVENOUS | Status: AC
  Filled 2020-04-06: qty 100

## 2020-04-06 MED ORDER — MIDAZOLAM HCL 2 MG/2ML IJ SOLN
INTRAMUSCULAR | Status: AC
  Administered 2020-04-06: 4 mg via INTRAVENOUS
  Filled 2020-04-06: qty 18

## 2020-04-06 MED ORDER — MIDAZOLAM HCL 2 MG/2ML IJ SOLN: 4.0000 mg | Freq: Once | INTRAMUSCULAR | Status: AC

## 2020-04-06 MED ORDER — BACITRACIN-POLYMYXIN B 500-10000 UNIT/GM OP OINT
TOPICAL_OINTMENT | OPHTHALMIC | Status: AC
  Filled 2020-04-06: qty 3.5

## 2020-04-06 MED ORDER — CLINDAMYCIN PHOSPHATE 900 MG/50ML IV SOLN
INTRAVENOUS | Status: DC | PRN
  Administered 2020-04-06: 900 mg via INTRAVENOUS

## 2020-04-06 MED ORDER — FENTANYL CITRATE (PF) 250 MCG/5ML IJ SOLN
INTRAMUSCULAR | Status: DC | PRN
  Administered 2020-04-06 (×3): 50 ug via INTRAVENOUS

## 2020-04-06 MED ORDER — FENTANYL CITRATE (PF) 100 MCG/2ML IJ SOLN: 300.0000 ug | Freq: Once | INTRAMUSCULAR | Status: DC

## 2020-04-06 MED ORDER — MIDAZOLAM HCL 5 MG/5ML IJ SOLN
INTRAMUSCULAR | Status: DC | PRN
  Administered 2020-04-06: 2 mg via INTRAVENOUS

## 2020-04-06 MED ORDER — LIDOCAINE-EPINEPHRINE 1 %-1:100000 IJ SOLN
INTRAMUSCULAR | Status: AC
  Filled 2020-04-06: qty 3

## 2020-04-06 MED ORDER — ALBUMIN HUMAN 5 % IV SOLN: INTRAVENOUS | Status: DC | PRN

## 2020-04-06 MED ORDER — BSS IO SOLN
INTRAOCULAR | Status: DC | PRN
  Administered 2020-04-06: 15 mL via INTRAOCULAR

## 2020-04-06 MED ORDER — FUROSEMIDE 10 MG/ML IJ SOLN
20.0000 mg | Freq: Once | INTRAMUSCULAR | Status: AC
  Administered 2020-04-06: 20 mg via INTRAVENOUS
  Filled 2020-04-06: qty 2

## 2020-04-06 MED ORDER — ONDANSETRON HCL 4 MG/2ML IJ SOLN
INTRAMUSCULAR | Status: DC | PRN
  Administered 2020-04-06 (×2): 4 mg via INTRAVENOUS

## 2020-04-06 MED ORDER — DEXAMETHASONE SODIUM PHOSPHATE 10 MG/ML IJ SOLN
INTRAMUSCULAR | Status: DC | PRN
  Administered 2020-04-06: 10 mg via INTRAVENOUS

## 2020-04-06 MED ORDER — LACTATED RINGERS IV SOLN: INTRAVENOUS | Status: DC | PRN

## 2020-04-06 MED ORDER — CLINDAMYCIN PHOSPHATE 900 MG/50ML IV SOLN: 900.0000 mg | INTRAVENOUS | Status: DC

## 2020-04-06 MED ORDER — BSS IO SOLN
INTRAOCULAR | Status: AC
  Filled 2020-04-06: qty 15

## 2020-04-06 SURGICAL SUPPLY — 80 items
BAND DENTAL 3/16IN PULL HEAVY (MISCELLANEOUS) ×1 IMPLANT
BIT DRILL 13 TWIST JC NONSTRL (BIT) ×1 IMPLANT
BIT DRILL TWIST 1.3X5 (BIT) ×4
BIT DRILL TWIST 1.3X5MM (BIT) IMPLANT
BIT DRILL UPPR FCE 0.9M 4 TWST (BIT) IMPLANT
BLADE CLIPPER SURG (BLADE) IMPLANT
CANISTER SUCT 3000ML PPV (MISCELLANEOUS) ×10 IMPLANT
CLEANER TIP ELECTROSURG 2X2 (MISCELLANEOUS) ×5 IMPLANT
COVER SURGICAL LIGHT HANDLE (MISCELLANEOUS) ×10 IMPLANT
COVER WAND RF STERILE (DRAPES) ×10 IMPLANT
DRAPE ORTHO SPLIT 77X108 STRL (DRAPES) ×5
DRAPE SURG ORHT 6 SPLT 77X108 (DRAPES) IMPLANT
DRAPE UTILITY XL STRL (DRAPES) ×5 IMPLANT
DRILL BIT TWIST 1.3X5MM (BIT) ×5
DRILL UPPERFACE 0.9M 4MM TWIST (BIT) ×5
DRSG TEGADERM 4X4.75 (GAUZE/BANDAGES/DRESSINGS) ×5 IMPLANT
DRSG TELFA 3X8 NADH (GAUZE/BANDAGES/DRESSINGS) ×5 IMPLANT
ELECT CAUTERY BLADE 6.4 (BLADE) ×5 IMPLANT
ELECT COATED BLADE 2.86 ST (ELECTRODE) ×5 IMPLANT
ELECT REM PT RETURN 9FT ADLT (ELECTROSURGICAL) ×10
ELECTRODE REM PT RTRN 9FT ADLT (ELECTROSURGICAL) ×8 IMPLANT
GAUZE 4X4 16PLY RFD (DISPOSABLE) ×5 IMPLANT
GLOVE BIO SURGEON STRL SZ 6.5 (GLOVE) ×10 IMPLANT
GLOVE BIOGEL M STRL SZ7.5 (GLOVE) ×5 IMPLANT
GLOVE BIOGEL PI IND STRL 6 (GLOVE) ×4 IMPLANT
GLOVE BIOGEL PI INDICATOR 6 (GLOVE) ×1
GLOVE INDICATOR 8.0 STRL GRN (GLOVE) ×5 IMPLANT
GOWN STRL REUS W/ TWL LRG LVL3 (GOWN DISPOSABLE) ×16 IMPLANT
GOWN STRL REUS W/TWL LRG LVL3 (GOWN DISPOSABLE) ×20
INTRODUCER TRACH BLUE RHINO 6F (TUBING) ×1 IMPLANT
KIT BASIN OR (CUSTOM PROCEDURE TRAY) ×10 IMPLANT
KIT SPLINT NASAL DENVER LRG BE (GAUZE/BANDAGES/DRESSINGS) ×1 IMPLANT
KIT TURNOVER KIT B (KITS) ×10 IMPLANT
NDL PRECISIONGLIDE 27X1.5 (NEEDLE) ×4 IMPLANT
NEEDLE PRECISIONGLIDE 27X1.5 (NEEDLE) ×5 IMPLANT
NS IRRIG 1000ML POUR BTL (IV SOLUTION) ×10 IMPLANT
PACK EENT II TURBAN DRAPE (CUSTOM PROCEDURE TRAY) ×5 IMPLANT
PAD ARMBOARD 7.5X6 YLW CONV (MISCELLANEOUS) ×20 IMPLANT
PAD DRESSING TELFA 3X8 NADH (GAUZE/BANDAGES/DRESSINGS) ×4 IMPLANT
PENCIL BUTTON HOLSTER BLD 10FT (ELECTRODE) ×10 IMPLANT
PLATE BONE 8MMX6 0.6MM RIGHT (Plate) ×1 IMPLANT
PLATE HYBRID MMF SM (Plate) ×2 IMPLANT
PLATE UPPER FACE .6X10H CURV (Plate) IMPLANT
PLATE UPPER FACE 10H CURVED (Plate) ×5 IMPLANT
PLATE UPPER FACE 4H ORBITAL (Plate) ×2 IMPLANT
POSITIONER HEAD DONUT 9IN (MISCELLANEOUS) ×5 IMPLANT
PROTECTOR CORNEAL (OPHTHALMIC RELATED) ×2 IMPLANT
SCISSORS WIRE ANG 4 3/4 DISP (INSTRUMENTS) ×5 IMPLANT
SCREW CARROL GIARRAD (Screw) ×1 IMPLANT
SCREW LOCK SELFDRIL 2.0X8M MMF (Screw) ×5 IMPLANT
SCREW LOCKING SELF DRILL 2.0X6 (Screw) ×5 IMPLANT
SCREW MIDFACE 1.7X4MM SLF TAP (Screw) ×4 IMPLANT
SCREW UPPERFACE 1.2X4M SLF TAP (Screw) ×11 IMPLANT
SCREW UPPERFACE 1.4X5MM (Screw) ×1 IMPLANT
SPLINT NASAL DOYLE BI-VL (GAUZE/BANDAGES/DRESSINGS) IMPLANT
SPONGE INTESTINAL PEANUT (DISPOSABLE) ×5 IMPLANT
SUT MON AB 3-0 SH 27 (SUTURE)
SUT MON AB 3-0 SH27 (SUTURE) ×8 IMPLANT
SUT MON AB 5-0 PS2 18 (SUTURE) ×2 IMPLANT
SUT PLAIN GUT FAST 5-0 (SUTURE) ×2 IMPLANT
SUT PROLENE 6 0 PC 1 (SUTURE) IMPLANT
SUT SILK 2 0 SH (SUTURE) IMPLANT
SUT STEEL 0 (SUTURE)
SUT STEEL 0 18XMFL TIE 17 (SUTURE) IMPLANT
SUT STEEL 1 (SUTURE) IMPLANT
SUT STEEL 2 (SUTURE) IMPLANT
SUT STEEL 4 (SUTURE) IMPLANT
SUT VIC AB 2-0 SH 27 (SUTURE)
SUT VIC AB 2-0 SH 27XBRD (SUTURE) IMPLANT
SUT VIC AB 3-0 SH 27 (SUTURE) ×5
SUT VIC AB 3-0 SH 27X BRD (SUTURE) IMPLANT
SUT VIC AB 4-0 RB1 27 (SUTURE) ×10
SUT VIC AB 4-0 RB1 27XBRD (SUTURE) IMPLANT
SUT VICRYL 4-0 PS2 18IN ABS (SUTURE) IMPLANT
SUT VICRYL AB 3 0 TIES (SUTURE) ×4 IMPLANT
SYR 20ML LL LF (SYRINGE) ×5 IMPLANT
TOWEL GREEN STERILE (TOWEL DISPOSABLE) ×10 IMPLANT
TOWEL GREEN STERILE FF (TOWEL DISPOSABLE) ×10 IMPLANT
TRAY ENT MC OR (CUSTOM PROCEDURE TRAY) ×6 IMPLANT
TUBE CONNECTING 12X1/4 (SUCTIONS) ×6 IMPLANT

## 2020-04-06 NOTE — Op Note (Signed)
° °  Operative Note  Date: 04/06/2020  Procedure: percutaneous tracheostomy with bronchoscopic assistance  Pre-op diagnosis: prolonged mechanical ventilation Post-op diagnosis: same  Indication and clinical history: The patient  is a 25 y.o. year old male with prolonged mechanical ventilation and plan for MMF.  Surgeon: Jesusita Oka, MD Assistant: Rodolph Bong, [place]  Anesthesia: general  Findings:   Specimen: none  EBL: <5cc   Drains/Implants: #6 Shiley cuffed tracheostomy tube  Disposition: ICU  Description of procedure: The patient was positioned supine with a shoulder roll. Time-out was performed verifying correct patient, procedure, signature of informed consent, and pre-operative antibiotics as indicated. MAC induction was uneventful and the patient was confirmed to be on 100% FiO2. The neck was prepped and draped in the usual sterile fashion. Palpation of neck anatomy was performed. A longitudinal incision was made in the neck and deepened down to the trachea.   The pilot balloon was deflated and the endotracheal tube slowly retracted proximally until the tip of the endotracheal tube was palpated just below the cricoid cartilage. An introducer needle was inserted between the second and third tracheal rings. The patient became hypoxic to the 80s and the ETT was advanced without improvement in his saturations. He was re-intubated by the anesthesiologist.   A bronchoscope was introduced into the endotracheal tube and positioned at the distal tip of the tube. The pilot balloon was deflated and the bronchoscope and endotracheal tube slowly retracted proximally until the bronchoscopic light was visualized just below the cricoid cartilage and the endotracheal tube was palpated just below the cricoid cartilage. An introducer needle was inserted between the second and third tracheal rings under bronchoscopic visualization. A guidewire was passed through the introducer needle and the needle  removed. Serial dilation was performed and a #6 cuffed Shiley and stylet were inserted over the guidewire and the guidewire and stylet removed. The inner cannula was inserted, the pilot balloon on the tracheostomy inflated, and the ventilator tubing disconnected from the endotracheal tube and connected to the tracheostomy. Chest rise, appropriate end-tidal CO2, and return tidal volumes were confirmed. Bronchoscopic evaluation was performed and confirmed adequate tracheostomy positioning above the carina.   The tracheostomy tube was sutured in four quadrants and a tracheostomy tie applied to the neck. The orotracheal tube was removed. All sponge and instrument counts were correct at the conclusion of the procedure. The patient tolerated the procedure well. There were no complications. Post-procedure chest x-ray was ordered to confirm tube position and the absence of a pneumothorax.   Jesusita Oka, MD General and [facility]

## 2020-04-06 NOTE — Progress Notes (Signed)
Pt returned from OR and RT placed patient back on ventilator with previous settings.  Pt tolerating well, RN at bedside, RT will continue to monitor.

## 2020-04-06 NOTE — Brief Op Note (Signed)
04/06/2020  5:49 PM  PATIENT:  Posey Boyer  25 y.o. male  PRE-OPERATIVE DIAGNOSIS:  facial fractures  POST-OPERATIVE DIAGNOSIS:  facial fractures  PROCEDURE:  Procedure(s) with comments: OPEN REDUCTION INTERNAL FIXATION (ORIF) MANDIBULAR FRACTURE (Bilateral) - 3.5 hours total OPEN REDUCTION INTERNAL FIXATION (ORIF) NASAL FRACTURE (Bilateral) OPEN REDUCTION INTERNAL FIXATION (ORIF) ORBITAL FRACTURE (Bilateral) TRACHEOSTOMY (N/A)  SURGEON:  Surgeon(s) and Role: Panel 1:    * Devontre Siedschlag, Wendy Poet, MD - Primary Panel 2:    * Diamantina Monks, MD - Primary  PHYSICIAN ASSISTANT: Materials engineer, PA  ASSISTANTS: none   ANESTHESIA:   general  EBL:  25 mL   BLOOD ADMINISTERED:none  DRAINS: none   LOCAL MEDICATIONS USED:  MARCAINE     SPECIMEN:  No Specimen  DISPOSITION OF SPECIMEN:  N/A  COUNTS:  YES  TOURNIQUET:  * No tourniquets in log *  DICTATION: .Dragon Dictation  PLAN OF CARE: Admit to inpatient   PATIENT DISPOSITION:  PACU - hemodynamically stable.   Delay start of Pharmacological VTE agent (>24hrs) due to surgical blood loss or risk of bleeding: not applicable
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
## 2020-04-06 NOTE — Progress Notes (Signed)
60 mL fentanyl wasted w/ Bartholomew Crews RN

## 2020-04-06 NOTE — Transfer of Care (Signed)
Immediate Anesthesia Transfer of Care Note  Patient: Jeremy George  Procedure(s) Performed: OPEN REDUCTION INTERNAL FIXATION (ORIF) MANDIBULAR FRACTURE (Bilateral Mouth) OPEN REDUCTION INTERNAL FIXATION (ORIF) NASAL FRACTURE (Bilateral Nose) OPEN REDUCTION INTERNAL FIXATION (ORIF) ORBITAL FRACTURE (Bilateral Eye) TRACHEOSTOMY (N/A )  Patient Location: ICU  Anesthesia Type:General  Level of Consciousness: sedated and ventilated  Airway & Oxygen Therapy: Patient remains intubated per anesthesia plan and Patient placed on Ventilator (see vital sign flow sheet for setting)  Post-op Assessment: Report given to RN and Post -op Vital signs reviewed and stable  Post vital signs: Reviewed and stable  Last Vitals:  Vitals Value Taken Time  BP 90/45 04/06/20 1800  Temp    Pulse 83 04/06/20 1806  Resp 24 04/06/20 1806  SpO2 97 % 04/06/20 1806  Vitals shown include unvalidated device data.  Last Pain:  Vitals:   04/06/20 1150  TempSrc: Axillary         Complications: No complications documented.
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
## 2020-04-06 NOTE — Progress Notes (Signed)
Fentanyl bag perforated when spiking, has continuous leak, verified w/ Bartholomew Crews RN.  Requested new bag from pharmacy.
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
## 2020-04-06 NOTE — Interval H&P Note (Signed)
History and Physical Interval Note:  04/06/2020 1:12 PM  Jeremy George  has presented today for surgery, with the diagnosis of facial fractures.  The various methods of treatment have been discussed with the patient and family. After consideration of risks, benefits and other options for treatment, the patient has consented to  Procedure(s) with comments: OPEN REDUCTION INTERNAL FIXATION (ORIF) MANDIBULAR FRACTURE (Bilateral) - 3.5 hours total OPEN REDUCTION INTERNAL FIXATION (ORIF) NASAL FRACTURE (Bilateral) OPEN REDUCTION INTERNAL FIXATION (ORIF) ORBITAL FRACTURE (Bilateral) TRACHEOSTOMY (N/A) as a surgical intervention.  The patient's history has been reviewed, patient examined, no change in status, stable for surgery.  I have reviewed the patient's chart and labs.  Questions were answered to the patient's satisfaction.     Allena Napoleon
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
## 2020-04-07 ENCOUNTER — Encounter (HOSPITAL_COMMUNITY): Payer: Self-pay | Admitting: Student

## 2020-04-07 ENCOUNTER — Inpatient Hospital Stay (HOSPITAL_COMMUNITY): Payer: Medicaid Other

## 2020-04-07 LAB — GLUCOSE, CAPILLARY
Glucose-Capillary: 104 mg/dL — ABNORMAL HIGH (ref 70–99)
Glucose-Capillary: 108 mg/dL — ABNORMAL HIGH (ref 70–99)
Glucose-Capillary: 119 mg/dL — ABNORMAL HIGH (ref 70–99)
Glucose-Capillary: 128 mg/dL — ABNORMAL HIGH (ref 70–99)
Glucose-Capillary: 135 mg/dL — ABNORMAL HIGH (ref 70–99)
Glucose-Capillary: 95 mg/dL (ref 70–99)

## 2020-04-07 LAB — BASIC METABOLIC PANEL
Anion gap: 12 (ref 5–15)
BUN: 16 mg/dL (ref 6–20)
CO2: 22 mmol/L (ref 22–32)
Calcium: 7.8 mg/dL — ABNORMAL LOW (ref 8.9–10.3)
Chloride: 105 mmol/L (ref 98–111)
Creatinine, Ser: 0.74 mg/dL (ref 0.61–1.24)
GFR, Estimated: >60 mL/min (ref >60)
Glucose, Bld: 128 mg/dL — ABNORMAL HIGH (ref 70–99)
Potassium: 4 mmol/L (ref 3.5–5.1)
Sodium: 139 mmol/L (ref 135–145)

## 2020-04-07 LAB — CBC
HCT: 27.6 % — ABNORMAL LOW (ref 39.0–52.0)
Hemoglobin: 8.9 g/dL — ABNORMAL LOW (ref 13.0–17.0)
MCH: 29.1 pg (ref 26.0–34.0)
MCHC: 32.2 g/dL (ref 30.0–36.0)
MCV: 90.2 fL (ref 80.0–100.0)
Platelets: 481 10*3/uL — ABNORMAL HIGH (ref 150–400)
RBC: 3.06 MIL/uL — ABNORMAL LOW (ref 4.22–5.81)
RDW: 14.9 % (ref 11.5–15.5)
WBC: 17.1 10*3/uL — ABNORMAL HIGH (ref 4.0–10.5)
nRBC: 0 % (ref 0.0–0.2)

## 2020-04-07 LAB — TRIGLYCERIDES: Triglycerides: 524 mg/dL — ABNORMAL HIGH (ref <150)

## 2020-04-07 IMAGING — DX DG CHEST 1V PORT
1 series · 1 of 1 positions shown · non-contrast
Comparison: [DATE]

CLINICAL DATA: Trauma, tracheostomy

EXAM:
PORTABLE CHEST 1 VIEW

[chest ap]
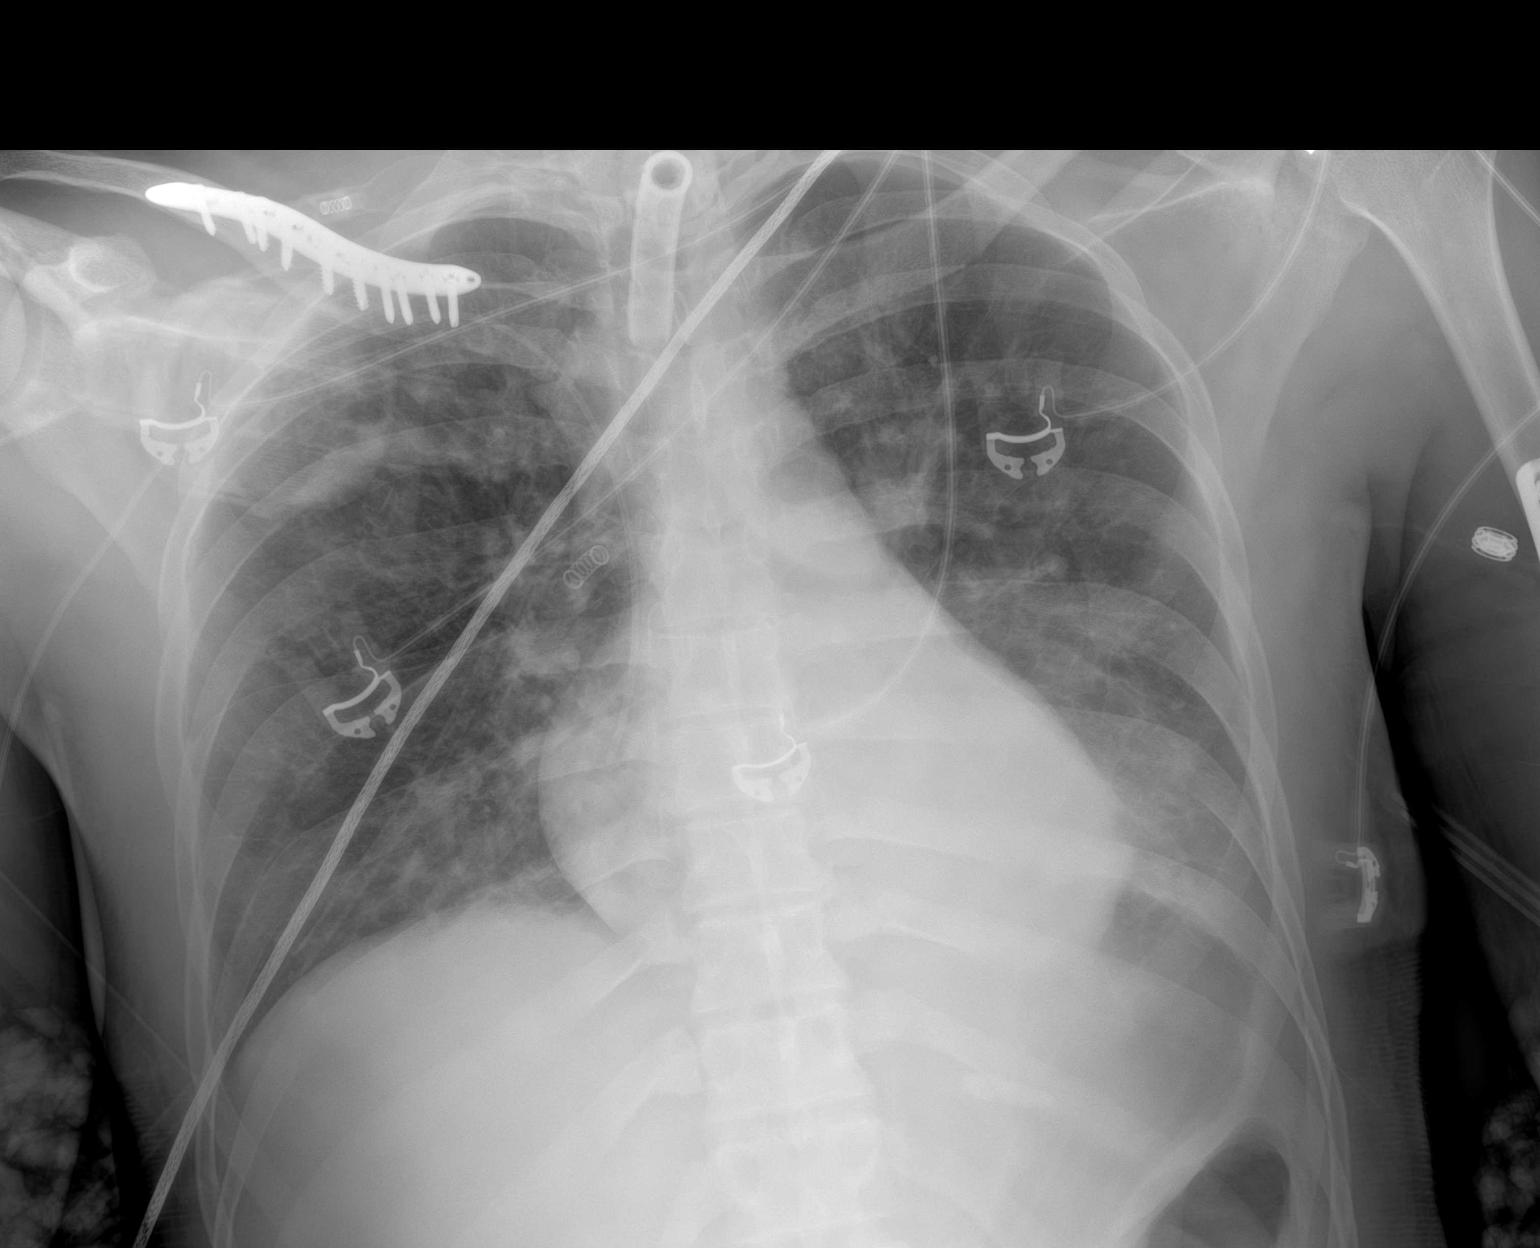

[1 of 1 positions shown; findings below may reference images not displayed]

FINDINGS: Tracheostomy in the upper trachea at the T3 level. NG tube removed.
Right PICC line tip SVC RA junction. Stable bibasilar opacities with
left lower lobe retrocardiac collapse/consolidation. No
pneumothorax. Right clavicle ORIF again noted.
IMPRESSION: Status post tracheostomy in the upper trachea.

Otherwise stable portable chest exam.

## 2020-04-07 MED ORDER — QUETIAPINE FUMARATE 200 MG PO TABS
200.0000 mg | ORAL_TABLET | Freq: Two times a day (BID) | ORAL | Status: DC
  Administered 2020-04-07 – 2020-04-08 (×2): 200 mg
  Filled 2020-04-07 (×2): qty 1

## 2020-04-07 MED ORDER — CLONAZEPAM 1 MG PO TABS
2.0000 mg | ORAL_TABLET | Freq: Two times a day (BID) | ORAL | Status: DC
  Administered 2020-04-07 – 2020-04-11 (×8): 2 mg
  Filled 2020-04-07 (×8): qty 2

## 2020-04-07 MED ORDER — MIDAZOLAM 50MG/50ML (1MG/ML) PREMIX INFUSION
0.0000 mg/h | INTRAVENOUS | Status: DC
  Administered 2020-04-07 (×2): 10 mg/h via INTRAVENOUS
  Administered 2020-04-07: 2 mg/h via INTRAVENOUS
  Administered 2020-04-07 – 2020-04-08 (×2): 10 mg/h via INTRAVENOUS
  Filled 2020-04-07 (×6): qty 50

## 2020-04-07 MED ORDER — ALTEPLASE 2 MG IJ SOLR
2.0000 mg | Freq: Once | INTRAMUSCULAR | Status: AC
  Administered 2020-04-07: 2 mg
  Filled 2020-04-07: qty 2

## 2020-04-07 MED ORDER — MIDAZOLAM BOLUS VIA INFUSION
1.0000 mg | INTRAVENOUS | Status: DC | PRN
  Administered 2020-04-08: 2 mg via INTRAVENOUS
  Filled 2020-04-07: qty 2

## 2020-04-07 MED ORDER — POLYETHYLENE GLYCOL 3350 17 G PO PACK
17.0000 g | PACK | Freq: Every day | ORAL | Status: DC
  Administered 2020-04-07 – 2020-04-16 (×8): 17 g
  Filled 2020-04-07 (×8): qty 1

## 2020-04-07 NOTE — Progress Notes (Signed)
1 Day Post-Op  Subjective: Patient is sedated, responds to verbal stimuli.  Opens eyes to verbal stimuli.  Mother at bedside.  RN at bedside.  Objective: Vital signs in last 24 hours: Temp:  [98.9 F (37.2 C)-101.9 F (38.8 C)] 98.9 F (37.2 C) (02/15 1154) Pulse Rate:  [77-109] 100 (02/15 1200) Resp:  [17-26] 24 (02/15 1200) BP: (90-172)/(45-95) 140/93 (02/15 1200) SpO2:  [94 %-100 %] 99 % (02/15 1200) FiO2 (%):  [30 %] 30 % (02/15 0802) Weight:  [85.9 kg] 85.9 kg (02/15 0500) Last BM Date:  (PTA)  Intake/Output from previous day: 02/14 0701 - 02/15 0700 In: 3993.1 [I.V.:2853.1; NG/GT:840; IV Piggyback:300] Out: 4075 [Urine:4050; Blood:25] Intake/Output this shift: Total I/O In: 950.4 [I.V.:600.4; NG/GT:350] Out: -   General appearance: Sedated, responds to verbal stimuli, opens eyes to verbal stimuli.  No acute distress. Head: Left cheek laceration intact, no sutures noted. Mouth: MMF bars in place, a few of the bands were popped.  MMF appears stable. Eyes: Conjunctiva clear, pupils are equal and reactive to light.  Swelling noted of bilateral upper eyelids as expected. Nose: [place] splint in place Neck: Tracheostomy in place Incision/Wound: Bilateral lower eyelid incisions intact.  No erythema noted.  Lab Results:  CBC Latest Ref Rng & Units 04/07/2020 04/06/2020 04/05/2020  WBC 4.0 - 10.5 K/uL 17.1(H) 15.5(H) 12.2(H)  Hemoglobin 13.0 - 17.0 g/dL 3.3(I) 9.5(J) 8.8(C)  Hematocrit 39.0 - 52.0 % 27.6(L) 27.1(L) 25.8(L)  Platelets 150 - 400 K/uL 481(H) 363 263    BMET Recent Labs    04/06/20 0435 04/07/20 0601  NA 142 139  K 3.8 4.0  CL 109 105  CO2 24 22  GLUCOSE 119* 128*  BUN 16 16  CREATININE 0.62 0.74  CALCIUM 8.0* 7.8*   PT/INR No results for input(s): LABPROT, INR in the last 72 hours. ABG No results for input(s): PHART, HCO3 in the last 72 hours.  Invalid input(s): PCO2, PO2  Studies/Results: DG CHEST PORT 1 VIEW  Result Date:  04/07/2020 CLINICAL DATA:  Trauma, tracheostomy EXAM: PORTABLE CHEST 1 VIEW COMPARISON:  04/06/2020 FINDINGS: Tracheostomy in the upper trachea at the T3 level. NG tube removed. Right PICC line tip SVC RA junction. Stable bibasilar opacities with left lower lobe retrocardiac collapse/consolidation. No pneumothorax. Right clavicle ORIF again noted. IMPRESSION: Status post tracheostomy in the upper trachea. Otherwise stable portable chest exam. Electronically Signed   By: Judie Petit.  Shick M.D.   On: 04/07/2020 08:19   DG CHEST PORT 1 VIEW  Result Date: 04/06/2020 CLINICAL DATA:  Respiratory failure. EXAM: PORTABLE CHEST 1 VIEW COMPARISON:  April 04, 2020 FINDINGS: Gastric tube courses below the diaphragm with the tip outside of the field of view. Endotracheal tube tip is at the level of clavicular heads. Low lung volumes. Similar versus slightly improved left greater than right bibasilar opacities, more confluent at the left lung base. No visible pleural effusions or pneumothorax. Cardiomediastinal silhouette is within normal limits. Partially imaged right clavicular fixation. IMPRESSION: Similar versus slightly improved left greater than right opacities (more confluent at the left lung base), concerning for pneumonia. A component of left lower lobe atelectasis/collapse is possible. Recommend imaging follow-up to resolution. Electronically Signed   By: Feliberto Harts MD   On: 04/06/2020 08:26    Anti-infectives: Anti-infectives (From admission, onward)   Start     Dose/Rate Route Frequency Ordered Stop   04/06/20 0800  clindamycin (CLEOCIN) IVPB 900 mg  Status:  Discontinued  900 mg 100 mL/hr over 30 Minutes Intravenous To ShortStay Surgical 04/06/20 0340 04/06/20 1149   04/03/20 1400  ceFAZolin (ANCEF) IVPB 2g/100 mL premix        2 g 200 mL/hr over 30 Minutes Intravenous Every 8 hours 04/03/20 1001 04/04/20 2142   04/03/20 0859  vancomycin (VANCOCIN) powder  Status:  Discontinued          As  needed 04/03/20 0900 04/03/20 0927   04/01/20 1415  tobramycin (NEBCIN) powder  Status:  Discontinued          As needed 04/01/20 1415 04/01/20 1730   04/01/20 1222  vancomycin (VANCOCIN) powder  Status:  Discontinued          As needed 04/01/20 1222 04/01/20 1730   04/01/20 1000  ceFEPIme (MAXIPIME) 2 g in sodium chloride 0.9 % 100 mL IVPB  Status:  Discontinued        2 g 200 mL/hr over 30 Minutes Intravenous Every 12 hours 04/01/20 0853 04/01/20 0857   04/01/20 1000  ceFEPIme (MAXIPIME) 2 g in sodium chloride 0.9 % 100 mL IVPB  Status:  Discontinued        2 g 200 mL/hr over 30 Minutes Intravenous Every 8 hours 04/01/20 0857 04/03/20 1001   03/30/20 0600  ceFAZolin (ANCEF) IVPB 2g/100 mL premix  Status:  Discontinued        2 g 200 mL/hr over 30 Minutes Intravenous On call to O.R. 03/29/20 1754 03/29/20 1800   03/29/20 2000  cefTRIAXone (ROCEPHIN) 2 g in sodium chloride 0.9 % 100 mL IVPB        2 g 200 mL/hr over 30 Minutes Intravenous Every 24 hours 03/29/20 1755 03/31/20 2119   03/29/20 1415  vancomycin (VANCOCIN) powder  Status:  Discontinued          As needed 03/29/20 1448 03/29/20 1553   03/29/20 1400  cefTRIAXone (ROCEPHIN) 2 g in sodium chloride 0.9 % 100 mL IVPB  Status:  Discontinued        2 g 200 mL/hr over 30 Minutes Intravenous Every 24 hours 03/29/20 1349 03/29/20 1755   03/29/20 0915  ceFAZolin (ANCEF) IVPB 2g/100 mL premix        2 g 200 mL/hr over 30 Minutes Intravenous  Once 03/29/20 0905 03/29/20 1123      Assessment/Plan: s/p Procedure(s): OPEN REDUCTION INTERNAL FIXATION (ORIF) MANDIBULAR FRACTURE OPEN REDUCTION INTERNAL FIXATION (ORIF) NASAL FRACTURE OPEN REDUCTION INTERNAL FIXATION (ORIF) ORBITAL FRACTURE TRACHEOSTOMY  We will continue to monitor.  Everything appears stable.  We will plan to keep in MMF for approximately 6 weeks. Lateral eyelid sutures will dissolve in 5 to 7 days.  Discussed plan with patient's mother, all of her questions were  answered.   LOS: 9 days    Leslee Home, PA-C 04/07/2020

## 2020-04-07 NOTE — Progress Notes (Signed)
   Providing Compassionate, Quality Care - Together  NEUROSURGERY PROGRESS NOTE   S: No issues overnight.  O: EXAM:  BP (!) 154/81   Pulse 96   Temp 99.3 F (37.4 C) (Axillary)   Resp 19   Ht 6' (1.829 m)   Wt 85.9 kg   SpO2 96%   BMI 25.68 kg/m   Eyes open spont Trach in place Tracks around room PERRL MAE, FCx4 Collar off  ASSESSMENT:  25 y.o. male with   1. 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  Please do not hesitate to call with questions or concerns.   Monia Pouch, DO Neurosurgeon [facility] Cell: [phone]

## 2020-04-07 NOTE — Anesthesia Postprocedure Evaluation (Addendum)
Anesthesia Post Note  Patient: Jeremy George  Procedure(s) Performed: OPEN REDUCTION INTERNAL FIXATION (ORIF) MANDIBULAR FRACTURE (Bilateral Mouth) OPEN REDUCTION INTERNAL FIXATION (ORIF) NASAL FRACTURE (Bilateral Nose) OPEN REDUCTION INTERNAL FIXATION (ORIF) ORBITAL FRACTURE (Bilateral Eye) TRACHEOSTOMY (N/A )     Patient location during evaluation: SICU Anesthesia Type: General Level of consciousness: sedated Pain management: pain level controlled Vital Signs Assessment: post-procedure vital signs reviewed and stable Respiratory status: patient connected to tracheostomy mask oxygen Cardiovascular status: stable Postop Assessment: no apparent nausea or vomiting Anesthetic complications: no   No complications documented.         Shelton Silvas

## 2020-04-07 NOTE — Progress Notes (Signed)
SLP Cancellation Note  Patient Details Name: Jeremy George MRN: [id number] DOB: [birth year]   Cancelled treatment:        Orders received for PMV and BSE. Pt sedated and on ready for assessments at this time. ST will follow along and evaluate when appropriate.    Tarris, Delbene 04/07/2020, 8:52 AM   Breck Coons Lonell Face.Ed Nurse, children's [phone] Office [phone]
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
## 2020-04-07 NOTE — Progress Notes (Signed)
Patient is postop from open treatment of left zygomaticomaxillary complex fracture, open treatment of bilateral displaced maxillary LeFort II type fractures and closed reduction for comminuted displaced bilateral nasal bones and MMF with Dr. Arita Miss.  Patient will remain in MMF for approximately 6 weeks. Patient had lateral tarsorrhaphy to prevent ectropion, these will dissolve in approximately 7 days.  We will continue to follow, will evaluate patient tomorrow 04/07/2020.

## 2020-04-08 ENCOUNTER — Encounter (HOSPITAL_COMMUNITY): Payer: Self-pay | Admitting: Surgery

## 2020-04-08 LAB — GLUCOSE, CAPILLARY
Glucose-Capillary: 120 mg/dL — ABNORMAL HIGH (ref 70–99)
Glucose-Capillary: 121 mg/dL — ABNORMAL HIGH (ref 70–99)
Glucose-Capillary: 123 mg/dL — ABNORMAL HIGH (ref 70–99)
Glucose-Capillary: 135 mg/dL — ABNORMAL HIGH (ref 70–99)
Glucose-Capillary: 136 mg/dL — ABNORMAL HIGH (ref 70–99)
Glucose-Capillary: 156 mg/dL — ABNORMAL HIGH (ref 70–99)

## 2020-04-08 LAB — CBC
HCT: 29.4 % — ABNORMAL LOW (ref 39.0–52.0)
Hemoglobin: 9.5 g/dL — ABNORMAL LOW (ref 13.0–17.0)
MCH: 28.8 pg (ref 26.0–34.0)
MCHC: 32.3 g/dL (ref 30.0–36.0)
MCV: 89.1 fL (ref 80.0–100.0)
Platelets: 533 10*3/uL — ABNORMAL HIGH (ref 150–400)
RBC: 3.3 MIL/uL — ABNORMAL LOW (ref 4.22–5.81)
RDW: 14.7 % (ref 11.5–15.5)
WBC: 22.8 10*3/uL — ABNORMAL HIGH (ref 4.0–10.5)
nRBC: 0.1 % (ref 0.0–0.2)

## 2020-04-08 LAB — BASIC METABOLIC PANEL
Anion gap: 11 (ref 5–15)
BUN: 15 mg/dL (ref 6–20)
CO2: 21 mmol/L — ABNORMAL LOW (ref 22–32)
Calcium: 8.1 mg/dL — ABNORMAL LOW (ref 8.9–10.3)
Chloride: 107 mmol/L (ref 98–111)
Creatinine, Ser: 0.59 mg/dL — ABNORMAL LOW (ref 0.61–1.24)
GFR, Estimated: >60 mL/min (ref >60)
Glucose, Bld: 131 mg/dL — ABNORMAL HIGH (ref 70–99)
Potassium: 5.2 mmol/L — ABNORMAL HIGH (ref 3.5–5.1)
Sodium: 139 mmol/L (ref 135–145)

## 2020-04-08 LAB — TRIGLYCERIDES: Triglycerides: 230 mg/dL — ABNORMAL HIGH (ref <150)

## 2020-04-08 MED ORDER — ACETAMINOPHEN 160 MG/5ML PO SOLN
1000.0000 mg | Freq: Four times a day (QID) | ORAL | Status: DC
  Administered 2020-04-08 – 2020-04-27 (×73): 1000 mg
  Filled 2020-04-08 (×74): qty 40.6

## 2020-04-08 MED ORDER — OXYCODONE HCL 5 MG/5ML PO SOLN
10.0000 mg | ORAL | Status: DC | PRN
  Administered 2020-04-08 – 2020-04-12 (×12): 15 mg
  Administered 2020-04-12: 10 mg
  Administered 2020-04-12 – 2020-04-13 (×5): 15 mg
  Administered 2020-04-14: 10 mg
  Administered 2020-04-14 – 2020-04-20 (×29): 15 mg
  Administered 2020-04-20: 10 mg
  Administered 2020-04-20: 15 mg
  Administered 2020-04-20: 10 mg
  Administered 2020-04-21 – 2020-04-22 (×5): 15 mg
  Administered 2020-04-23 – 2020-04-25 (×4): 10 mg
  Administered 2020-04-25: 15 mg
  Administered 2020-04-25 – 2020-04-26 (×5): 10 mg
  Filled 2020-04-08 (×3): qty 15
  Filled 2020-04-08: qty 10
  Filled 2020-04-08 (×17): qty 15
  Filled 2020-04-08: qty 10
  Filled 2020-04-08 (×2): qty 15
  Filled 2020-04-08: qty 10
  Filled 2020-04-08 (×4): qty 15
  Filled 2020-04-08: qty 10
  Filled 2020-04-08 (×6): qty 15
  Filled 2020-04-08: qty 10
  Filled 2020-04-08 (×5): qty 15
  Filled 2020-04-08: qty 10
  Filled 2020-04-08 (×5): qty 15
  Filled 2020-04-08: qty 10
  Filled 2020-04-08: qty 15
  Filled 2020-04-08: qty 10
  Filled 2020-04-08: qty 15
  Filled 2020-04-08: qty 10
  Filled 2020-04-08 (×11): qty 15
  Filled 2020-04-08 (×2): qty 10
  Filled 2020-04-08: qty 15

## 2020-04-08 MED ORDER — QUETIAPINE FUMARATE 100 MG PO TABS
100.0000 mg | ORAL_TABLET | Freq: Once | ORAL | Status: AC
  Administered 2020-04-08: 100 mg via ORAL
  Filled 2020-04-08: qty 1

## 2020-04-08 MED ORDER — KETOROLAC TROMETHAMINE 15 MG/ML IJ SOLN
30.0000 mg | Freq: Four times a day (QID) | INTRAMUSCULAR | Status: AC
  Administered 2020-04-08 – 2020-04-13 (×20): 30 mg via INTRAVENOUS
  Filled 2020-04-08 (×20): qty 2

## 2020-04-08 MED ORDER — QUETIAPINE FUMARATE 200 MG PO TABS
300.0000 mg | ORAL_TABLET | Freq: Two times a day (BID) | ORAL | Status: DC
  Administered 2020-04-08 – 2020-04-10 (×4): 300 mg
  Filled 2020-04-08 (×4): qty 1

## 2020-04-08 MED ORDER — FUROSEMIDE 10 MG/ML IJ SOLN
40.0000 mg | Freq: Once | INTRAMUSCULAR | Status: AC
  Administered 2020-04-08: 40 mg via INTRAVENOUS
  Filled 2020-04-08: qty 4

## 2020-04-08 MED ORDER — METHOCARBAMOL 500 MG PO TABS
1000.0000 mg | ORAL_TABLET | Freq: Three times a day (TID) | ORAL | Status: DC
  Administered 2020-04-08 – 2020-04-27 (×58): 1000 mg
  Filled 2020-04-08 (×56): qty 2

## 2020-04-08 NOTE — Progress Notes (Signed)
RT placed patient on 35% 8L ATC. Patient tolerating well at this time. No distress or increased WOB noted at this time. RN at bedside. RT will continue to monitor.
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
## 2020-04-08 NOTE — Progress Notes (Signed)
Pt maintaining well on 8L 35% at this time, RT will monitor pt throughout the night and place pt back on vent if pt becomes SOB.

## 2020-04-09 ENCOUNTER — Encounter: Payer: Self-pay | Admitting: Student

## 2020-04-09 LAB — GLUCOSE, CAPILLARY
Glucose-Capillary: 114 mg/dL — ABNORMAL HIGH (ref 70–99)
Glucose-Capillary: 116 mg/dL — ABNORMAL HIGH (ref 70–99)
Glucose-Capillary: 117 mg/dL — ABNORMAL HIGH (ref 70–99)
Glucose-Capillary: 141 mg/dL — ABNORMAL HIGH (ref 70–99)
Glucose-Capillary: 143 mg/dL — ABNORMAL HIGH (ref 70–99)
Glucose-Capillary: 98 mg/dL (ref 70–99)
Glucose-Capillary: 98 mg/dL (ref 70–99)

## 2020-04-09 MED ORDER — IPRATROPIUM-ALBUTEROL 0.5-2.5 (3) MG/3ML IN SOLN
3.0000 mL | Freq: Once | RESPIRATORY_TRACT | Status: AC
  Administered 2020-04-09: 3 mL via RESPIRATORY_TRACT
  Filled 2020-04-09: qty 3

## 2020-04-09 MED ORDER — IPRATROPIUM-ALBUTEROL 0.5-2.5 (3) MG/3ML IN SOLN
3.0000 mL | Freq: Four times a day (QID) | RESPIRATORY_TRACT | Status: DC | PRN
  Administered 2020-04-10 – 2020-04-13 (×2): 3 mL via RESPIRATORY_TRACT
  Filled 2020-04-09 (×2): qty 3
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
## 2020-04-09 NOTE — Progress Notes (Signed)
Inpatient Rehab Admissions Coordinator Note:   Per therapy recommendations, pt was screened for CIR candidacy by Estill Dooms, PT, DPT.  At this time we are recommending a CIR consult and I will place an order per our protocol.  Please contact me with questions.   Estill Dooms, PT, DPT [phone] 04/09/20 1:46 PM

## 2020-04-10 ENCOUNTER — Inpatient Hospital Stay (HOSPITAL_COMMUNITY): Payer: Medicaid Other

## 2020-04-10 DIAGNOSIS — R0682 Tachypnea, not elsewhere classified: Secondary | ICD-10-CM

## 2020-04-10 DIAGNOSIS — R030 Elevated blood-pressure reading, without diagnosis of hypertension: Secondary | ICD-10-CM

## 2020-04-10 DIAGNOSIS — T07XXXA Unspecified multiple injuries, initial encounter: Principal | ICD-10-CM

## 2020-04-10 DIAGNOSIS — Z930 Tracheostomy status: Secondary | ICD-10-CM

## 2020-04-10 DIAGNOSIS — D62 Acute posthemorrhagic anemia: Secondary | ICD-10-CM

## 2020-04-10 DIAGNOSIS — D72829 Elevated white blood cell count, unspecified: Secondary | ICD-10-CM

## 2020-04-10 DIAGNOSIS — S062X9A Diffuse traumatic brain injury with loss of consciousness of unspecified duration, initial encounter: Secondary | ICD-10-CM

## 2020-04-10 DIAGNOSIS — R739 Hyperglycemia, unspecified: Secondary | ICD-10-CM

## 2020-04-10 LAB — GLUCOSE, CAPILLARY
Glucose-Capillary: 102 mg/dL — ABNORMAL HIGH (ref 70–99)
Glucose-Capillary: 106 mg/dL — ABNORMAL HIGH (ref 70–99)
Glucose-Capillary: 114 mg/dL — ABNORMAL HIGH (ref 70–99)
Glucose-Capillary: 118 mg/dL — ABNORMAL HIGH (ref 70–99)
Glucose-Capillary: 89 mg/dL (ref 70–99)
Glucose-Capillary: 99 mg/dL (ref 70–99)
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
## 2020-04-10 MED ORDER — KETAMINE HCL 50 MG/5ML IJ SOSY
25.0000 mg | PREFILLED_SYRINGE | Freq: Four times a day (QID) | INTRAMUSCULAR | Status: DC | PRN
  Administered 2020-04-10 – 2020-04-20 (×18): 25 mg via INTRAVENOUS
  Filled 2020-04-10 (×20): qty 5

## 2020-04-10 MED ORDER — BISACODYL 10 MG RE SUPP
10.0000 mg | Freq: Every day | RECTAL | Status: DC
  Administered 2020-04-10: 10 mg via RECTAL
  Filled 2020-04-10: qty 1

## 2020-04-10 MED ORDER — QUETIAPINE FUMARATE 200 MG PO TABS
400.0000 mg | ORAL_TABLET | Freq: Two times a day (BID) | ORAL | Status: DC
  Administered 2020-04-10 – 2020-04-11 (×2): 400 mg
  Filled 2020-04-10 (×2): qty 2

## 2020-04-10 MED ORDER — MORPHINE SULFATE (PF) 2 MG/ML IV SOLN
2.0000 mg | INTRAVENOUS | Status: DC | PRN
  Administered 2020-04-10: 2 mg via INTRAVENOUS
  Administered 2020-04-10 – 2020-04-13 (×14): 4 mg via INTRAVENOUS
  Administered 2020-04-14: 2 mg via INTRAVENOUS
  Administered 2020-04-14 – 2020-04-18 (×9): 4 mg via INTRAVENOUS
  Administered 2020-04-19 – 2020-04-21 (×6): 2 mg via INTRAVENOUS
  Administered 2020-04-21 – 2020-04-22 (×2): 4 mg via INTRAVENOUS
  Filled 2020-04-10: qty 2
  Filled 2020-04-10: qty 1
  Filled 2020-04-10: qty 2
  Filled 2020-04-10: qty 1
  Filled 2020-04-10 (×8): qty 2
  Filled 2020-04-10: qty 1
  Filled 2020-04-10 (×5): qty 2
  Filled 2020-04-10: qty 1
  Filled 2020-04-10 (×3): qty 2
  Filled 2020-04-10 (×2): qty 1
  Filled 2020-04-10 (×11): qty 2
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
## 2020-04-10 NOTE — Progress Notes (Signed)
While performing oral care found loose rubber band in patient's mouth, could not visualize any other rubber bands on visual inspection. Spoke with Dr. Janee Morn who said that the Doctor rounding today would address.

## 2020-04-11 DIAGNOSIS — R451 Restlessness and agitation: Secondary | ICD-10-CM | POA: Diagnosis not present

## 2020-04-11 LAB — GLUCOSE, CAPILLARY: Glucose-Capillary: 109 mg/dL — ABNORMAL HIGH (ref 70–99)

## 2020-04-11 MED ORDER — QUETIAPINE FUMARATE 25 MG PO TABS
50.0000 mg | ORAL_TABLET | Freq: Two times a day (BID) | ORAL | Status: DC
  Administered 2020-04-11: 50 mg
  Filled 2020-04-11: qty 2

## 2020-04-11 MED ORDER — CLONAZEPAM 1 MG PO TABS
1.0000 mg | ORAL_TABLET | Freq: Two times a day (BID) | ORAL | Status: DC
  Administered 2020-04-11: 1 mg
  Filled 2020-04-11: qty 1
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
## 2020-04-11 NOTE — Progress Notes (Signed)
SLP Cancellation Note  Patient Details Name: Jeremy George MRN: [id number] DOB: 1996/01/23   Cancelled treatment:       Reason Eval/Treat Not Completed: Fatigue/lethargy limiting ability to participate ST seeing pt for PMV and have orders for speech-language-cognition. Currently unable to participate as has Precedex (highest dose) this am, Klonopin, Seroquel. Will continue efforts.    [place], [place] 04/11/2020, 12:26 PM  Breck Coons Lonell Face.Ed Nurse, children's [phone] Office [phone]

## 2020-04-12 LAB — BASIC METABOLIC PANEL
Anion gap: 9 (ref 5–15)
BUN: 16 mg/dL (ref 6–20)
CO2: 22 mmol/L (ref 22–32)
Calcium: 8.1 mg/dL — ABNORMAL LOW (ref 8.9–10.3)
Chloride: 109 mmol/L (ref 98–111)
Creatinine, Ser: 0.63 mg/dL (ref 0.61–1.24)
GFR, Estimated: >60 mL/min (ref >60)
Glucose, Bld: 137 mg/dL — ABNORMAL HIGH (ref 70–99)
Potassium: 3.7 mmol/L (ref 3.5–5.1)
Sodium: 140 mmol/L (ref 135–145)

## 2020-04-12 LAB — CBC
HCT: 26.5 % — ABNORMAL LOW (ref 39.0–52.0)
Hemoglobin: 8.4 g/dL — ABNORMAL LOW (ref 13.0–17.0)
MCH: 29 pg (ref 26.0–34.0)
MCHC: 31.7 g/dL (ref 30.0–36.0)
MCV: 91.4 fL (ref 80.0–100.0)
Platelets: 560 10*3/uL — ABNORMAL HIGH (ref 150–400)
RBC: 2.9 MIL/uL — ABNORMAL LOW (ref 4.22–5.81)
RDW: 13.8 % (ref 11.5–15.5)
WBC: 12.6 10*3/uL — ABNORMAL HIGH (ref 4.0–10.5)
nRBC: 0 % (ref 0.0–0.2)

## 2020-04-12 MED ORDER — SODIUM CHLORIDE 0.9 % IV BOLUS: 1000.0000 mL | Freq: Once | INTRAVENOUS | Status: DC

## 2020-04-12 MED ORDER — QUETIAPINE FUMARATE 200 MG PO TABS
200.0000 mg | ORAL_TABLET | Freq: Every morning | ORAL | Status: DC
  Administered 2020-04-12: 200 mg
  Filled 2020-04-12: qty 1

## 2020-04-12 MED ORDER — QUETIAPINE FUMARATE 200 MG PO TABS
400.0000 mg | ORAL_TABLET | Freq: Every day | ORAL | Status: DC
  Administered 2020-04-12: 400 mg
  Filled 2020-04-12: qty 2

## 2020-04-12 MED ORDER — ALTEPLASE 2 MG IJ SOLR
2.0000 mg | Freq: Once | INTRAMUSCULAR | Status: DC
  Filled 2020-04-12: qty 2

## 2020-04-12 MED ORDER — CLONAZEPAM 1 MG PO TABS
2.0000 mg | ORAL_TABLET | Freq: Two times a day (BID) | ORAL | Status: AC
  Administered 2020-04-12 – 2020-04-22 (×22): 2 mg
  Filled 2020-04-12 (×22): qty 2

## 2020-04-12 MED ORDER — BISACODYL 10 MG RE SUPP: 10.0000 mg | Freq: Every day | RECTAL | Status: DC | PRN
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
## 2020-04-13 ENCOUNTER — Inpatient Hospital Stay (HOSPITAL_COMMUNITY): Payer: Medicaid Other

## 2020-04-13 LAB — BASIC METABOLIC PANEL
Anion gap: 7 (ref 5–15)
BUN: 16 mg/dL (ref 6–20)
CO2: 24 mmol/L (ref 22–32)
Calcium: 8 mg/dL — ABNORMAL LOW (ref 8.9–10.3)
Chloride: 109 mmol/L (ref 98–111)
Creatinine, Ser: 0.55 mg/dL — ABNORMAL LOW (ref 0.61–1.24)
GFR, Estimated: >60 mL/min (ref >60)
Glucose, Bld: 128 mg/dL — ABNORMAL HIGH (ref 70–99)
Potassium: 3.7 mmol/L (ref 3.5–5.1)
Sodium: 140 mmol/L (ref 135–145)
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
## 2020-04-13 MED ORDER — QUETIAPINE FUMARATE 200 MG PO TABS
600.0000 mg | ORAL_TABLET | Freq: Every day | ORAL | Status: DC
  Administered 2020-04-13: 600 mg
  Filled 2020-04-13: qty 3

## 2020-04-13 MED ORDER — QUETIAPINE FUMARATE 200 MG PO TABS
200.0000 mg | ORAL_TABLET | Freq: Every morning | ORAL | Status: DC
  Administered 2020-04-13: 200 mg
  Filled 2020-04-13: qty 1

## 2020-04-13 MED ORDER — CHLORDIAZEPOXIDE HCL 25 MG PO CAPS
25.0000 mg | ORAL_CAPSULE | Freq: Three times a day (TID) | ORAL | Status: DC
  Administered 2020-04-13 (×3): 25 mg
  Filled 2020-04-13 (×3): qty 1

## 2020-04-13 NOTE — Progress Notes (Signed)
PT Cancellation Note  Patient Details Name: Satoru Milich MRN: [id number] DOB: [birth year]   Cancelled Treatment:    Reason Eval/Treat Not Completed: Fatigue/lethargy limiting ability to participate - pt sleeping soundly, will check back as schedule allows.  Marye Round, PT Acute Rehabilitation Services Pager [phone]  Office [phone]    Truddie Coco 04/13/2020, 2:56 PM
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
## 2020-04-13 NOTE — Progress Notes (Signed)
Pt's SBP frequently registered >180. The cuff is on his left leg, because unable to use the right or left arm.  He was agitated and restless throughout the day making it difficult to obtain an accurate BP measurement.

## 2020-04-13 NOTE — Progress Notes (Signed)
Around 1215 gave 25 mg of Ketamine and wasted the other 25 mg, witnessed by Carilyn Goodpasture, Charity fundraiser.  The PYXIS was having issues thus unable to waste there.

## 2020-04-13 NOTE — Progress Notes (Signed)
Assisted tele visit to patient with partner.  Jeremy George Harold, RN  

## 2020-04-14 ENCOUNTER — Inpatient Hospital Stay (HOSPITAL_COMMUNITY): Payer: Medicaid Other

## 2020-04-14 LAB — BASIC METABOLIC PANEL
Anion gap: 8 (ref 5–15)
BUN: 13 mg/dL (ref 6–20)
CO2: 23 mmol/L (ref 22–32)
Calcium: 8.2 mg/dL — ABNORMAL LOW (ref 8.9–10.3)
Chloride: 109 mmol/L (ref 98–111)
Creatinine, Ser: 0.58 mg/dL — ABNORMAL LOW (ref 0.61–1.24)
GFR, Estimated: >60 mL/min (ref >60)
Glucose, Bld: 126 mg/dL — ABNORMAL HIGH (ref 70–99)
Potassium: 3.8 mmol/L (ref 3.5–5.1)
Sodium: 140 mmol/L (ref 135–145)

## 2020-04-14 MED ORDER — VALPROIC ACID 250 MG/5ML PO SOLN
500.0000 mg | Freq: Two times a day (BID) | ORAL | Status: DC
  Administered 2020-04-14 – 2020-04-16 (×5): 500 mg
  Filled 2020-04-14 (×5): qty 10

## 2020-04-14 MED ORDER — CHLORDIAZEPOXIDE HCL 25 MG PO CAPS
50.0000 mg | ORAL_CAPSULE | Freq: Three times a day (TID) | ORAL | Status: DC
  Administered 2020-04-14 – 2020-04-27 (×40): 50 mg
  Filled 2020-04-14 (×40): qty 2

## 2020-04-14 MED ORDER — HALOPERIDOL 1 MG PO TABS
2.0000 mg | ORAL_TABLET | Freq: Three times a day (TID) | ORAL | Status: DC
  Administered 2020-04-14: 2 mg
  Filled 2020-04-14 (×3): qty 2

## 2020-04-14 MED ORDER — QUETIAPINE FUMARATE 100 MG PO TABS
200.0000 mg | ORAL_TABLET | Freq: Every morning | ORAL | Status: DC
  Administered 2020-04-15 – 2020-04-27 (×13): 200 mg
  Filled 2020-04-14: qty 1
  Filled 2020-04-14 (×2): qty 2
  Filled 2020-04-14 (×4): qty 1
  Filled 2020-04-14: qty 2
  Filled 2020-04-14: qty 1
  Filled 2020-04-14: qty 2
  Filled 2020-04-14: qty 1
  Filled 2020-04-14: qty 2

## 2020-04-14 MED ORDER — HALOPERIDOL 1 MG PO TABS
2.0000 mg | ORAL_TABLET | Freq: Three times a day (TID) | ORAL | Status: DC
  Filled 2020-04-14: qty 2

## 2020-04-14 MED ORDER — QUETIAPINE FUMARATE 200 MG PO TABS
400.0000 mg | ORAL_TABLET | Freq: Every morning | ORAL | Status: DC
  Administered 2020-04-14: 400 mg
  Filled 2020-04-14: qty 2

## 2020-04-14 MED ORDER — QUETIAPINE FUMARATE 100 MG PO TABS
600.0000 mg | ORAL_TABLET | Freq: Every day | ORAL | Status: DC
  Administered 2020-04-14 – 2020-04-26 (×13): 600 mg
  Filled 2020-04-14: qty 3
  Filled 2020-04-14 (×3): qty 6
  Filled 2020-04-14: qty 3
  Filled 2020-04-14: qty 6
  Filled 2020-04-14 (×3): qty 3
  Filled 2020-04-14 (×2): qty 6
  Filled 2020-04-14 (×2): qty 3
  Filled 2020-04-14: qty 6

## 2020-04-14 MED ORDER — QUETIAPINE FUMARATE 200 MG PO TABS: 800.0000 mg | ORAL_TABLET | Freq: Every day | ORAL | Status: DC
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
## 2020-04-14 NOTE — Progress Notes (Signed)
Assisted tele visit to patient with family member.  Eowyn Tabone D Burris Matherne, RN   
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
## 2020-04-14 NOTE — Progress Notes (Signed)
Inpatient Rehab Admissions Coordinator:   Note still working to wean sedation.  Still following from a distance.   Estill Dooms, PT, DPT Admissions Coordinator [phone] 04/14/20  12:15 PM
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
## 2020-04-14 NOTE — Progress Notes (Signed)
Modified Barium Swallow Progress Note  Patient Details  Name: Jeremy George MRN: [id number] Date of Birth: [birth year]  Today's Date: 04/14/2020  Modified Barium Swallow completed.  Full report located under Chart Review in the Imaging Section.  Brief recommendations include the following:  Clinical Impression  MBSS somewhat limited due to pt agitation and at times combative behavior. Pts mom present who assisted greatly with pt cooperation. Pt presents with a mild to moderate oropharyngeal dysphagia. Oral deficits suspected to be greatly impacted by bilateral facial fractures with decreased ROM and strength. Pt with delayed bolus transit and mild oral residuals. Pharyngeally pt with delay in swallow initiation at the level of the pyriform with thin liquids. Pt without laryngeal penetration or tracheal aspiration of POs, however SLP unable to aduately challenge pt with consecutive swallows of consistencies due to agitation and reduced pt cooperation. Pt did exhibit reduced swallowing efficiency with bilateral pharyngeal residuals in the valleculae and pyrifrom sinsus due to reduced base of tongue retraction and decreased laryngeal elevation. Liquid alternation assisted to decrease residual amount. Recommend conservative dysphagia 1 (puree) and nectar thick liquid diet with meds crushed in puree. Pt okay for ice chips and water in between meals following diligent oral care as tolerated. SLP to follow up for tolerance and diet advancement based upon clinical interactions.   Swallow Evaluation Recommendations       SLP Diet Recommendations: Dysphagia 1 (Puree) solids;Nectar thick liquid   Liquid Administration via: Cup   Medication Administration: Crushed with puree       Compensations: Slow rate;Minimize environmental distractions;Small sips/bites;Follow solids with liquid   Postural Changes: Remain semi-upright after after feeds/meals (Comment);Seated upright at 90 degrees   Oral Care  Recommendations: Oral care BID   Other Recommendations: Order thickener from pharmacy    Ardyth Gal MA, CCC-SLP Acute Rehabilitation Services   04/14/2020,12:46 PM

## 2020-04-14 NOTE — Plan of Care (Signed)
  Problem: Clinical Measurements: Goal: Will remain free from infection Outcome: Progressing Goal: Respiratory complications will improve Outcome: Progressing Goal: Cardiovascular complication will be avoided Outcome: Progressing   Problem: Activity: Goal: Risk for activity intolerance will decrease Outcome: Progressing   Problem: Nutrition: Goal: Adequate nutrition will be maintained Outcome: Progressing   Problem: Elimination: Goal: Will not experience complications related to urinary retention Outcome: Progressing

## 2020-04-14 NOTE — Progress Notes (Signed)
RT removed all four trach sutures and verified removal with Tresa Endo, RN per MD order. Patient tolerated well. RT will continue to monitor as needed.

## 2020-04-14 NOTE — Progress Notes (Signed)
Pt was transferred to and from swallow/radiology evaluation. Pt is on Precedex (max), gets agitated very easy. Family at the bedside to easy agitation and helping with following commands. Vital signs are stable, on RA.   Returned, less agitated. Bed in low position, alarms are on, call bell in reach, family and RN at the bedside.
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
## 2020-04-15 LAB — BASIC METABOLIC PANEL
Anion gap: 10 (ref 5–15)
BUN: 13 mg/dL (ref 6–20)
CO2: 23 mmol/L (ref 22–32)
Calcium: 8.5 mg/dL — ABNORMAL LOW (ref 8.9–10.3)
Chloride: 105 mmol/L (ref 98–111)
Creatinine, Ser: 0.6 mg/dL — ABNORMAL LOW (ref 0.61–1.24)
GFR, Estimated: >60 mL/min (ref >60)
Glucose, Bld: 114 mg/dL — ABNORMAL HIGH (ref 70–99)
Potassium: 4 mmol/L (ref 3.5–5.1)
Sodium: 138 mmol/L (ref 135–145)

## 2020-04-15 MED ORDER — CLONIDINE HCL 0.2 MG PO TABS
0.3000 mg | ORAL_TABLET | Freq: Four times a day (QID) | ORAL | Status: DC
  Administered 2020-04-15 – 2020-04-16 (×4): 0.3 mg
  Filled 2020-04-15 (×4): qty 1

## 2020-04-15 MED ORDER — ORAL CARE MOUTH RINSE
15.0000 mL | Freq: Two times a day (BID) | OROMUCOSAL | Status: DC
  Administered 2020-04-15 – 2020-04-27 (×22): 15 mL via OROMUCOSAL

## 2020-04-15 NOTE — Progress Notes (Signed)
Inpatient Rehab Admissions:  Inpatient Rehab Consult received.  I met with patient's mother at the bedside for rehabilitation assessment and to discuss goals and expectations of an inpatient rehab admission.  Pt is sedated and unable to participate in conversation.  We discussed CIR program, estimated length of stay to be about 3-4 weeks (though can vary based on patients progress while on rehab), and goals of supervision to min assist.  Mom, Alda Berthold, reports patient will have 24/7 support at discharge.  We discussed insurance and she is unsure whether he in insured through his employer.  If so, would likely need prior authorization for CIR.  If uninsured I will provide her with an estimated facility cost.  She is agreeable.  Will follow for successful wean from sedation and probable CIR admission when ready/bed available.    Signed: Shann Medal, PT, DPT Admissions Coordinator [phone] 04/15/20  1:14 PM
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
## 2020-04-15 NOTE — Procedures (Signed)
Tracheostomy Change Note  Patient Details:   Name: Jeremy George DOB: Sep 14, 1995 MRN: [id number]    Airway Documentation:     Evaluation  O2 sats: stable throughout Complications: No apparent complications Patient did tolerate procedure well. Bilateral Breath Sounds: Rhonchi,Diminished   RT assisted Dr. Bedelia Person at bedside with trach change from #6 [facility] cuffed to #6 SH cuffless. Pt did have positive color change after new trach placed. Pt tolerated well w/SVS.    Hart Rochester R 04/15/2020, 6:40 PM
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
## 2020-04-16 LAB — BASIC METABOLIC PANEL
Anion gap: 10 (ref 5–15)
BUN: 14 mg/dL (ref 6–20)
CO2: 23 mmol/L (ref 22–32)
Calcium: 8.9 mg/dL (ref 8.9–10.3)
Chloride: 103 mmol/L (ref 98–111)
Creatinine, Ser: 0.63 mg/dL (ref 0.61–1.24)
GFR, Estimated: >60 mL/min (ref >60)
Glucose, Bld: 102 mg/dL — ABNORMAL HIGH (ref 70–99)
Potassium: 4.3 mmol/L (ref 3.5–5.1)
Sodium: 136 mmol/L (ref 135–145)

## 2020-04-16 MED ORDER — VALPROIC ACID 250 MG/5ML PO SOLN
500.0000 mg | Freq: Three times a day (TID) | ORAL | Status: DC
  Administered 2020-04-16 – 2020-04-27 (×33): 500 mg
  Filled 2020-04-16 (×33): qty 10

## 2020-04-16 MED ORDER — CLONIDINE HCL 0.1 MG PO TABS
0.4000 mg | ORAL_TABLET | Freq: Four times a day (QID) | ORAL | Status: AC
  Administered 2020-04-16 – 2020-04-21 (×22): 0.4 mg
  Filled 2020-04-16 (×4): qty 2
  Filled 2020-04-16: qty 4
  Filled 2020-04-16 (×17): qty 2
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
## 2020-04-17 LAB — BASIC METABOLIC PANEL
Anion gap: 11 (ref 5–15)
BUN: 19 mg/dL (ref 6–20)
CO2: 25 mmol/L (ref 22–32)
Calcium: 8.9 mg/dL (ref 8.9–10.3)
Chloride: 99 mmol/L (ref 98–111)
Creatinine, Ser: 0.67 mg/dL (ref 0.61–1.24)
GFR, Estimated: >60 mL/min (ref >60)
Glucose, Bld: 120 mg/dL — ABNORMAL HIGH (ref 70–99)
Potassium: 4.5 mmol/L (ref 3.5–5.1)
Sodium: 135 mmol/L (ref 135–145)
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
## 2020-04-18 MED ORDER — RESOURCE THICKENUP CLEAR PO POWD
ORAL | Status: DC | PRN
  Filled 2020-04-18: qty 125
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
## 2020-04-19 DIAGNOSIS — L8990 Pressure ulcer of unspecified site, unspecified stage: Secondary | ICD-10-CM | POA: Insufficient documentation
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
## 2020-04-20 ENCOUNTER — Inpatient Hospital Stay (HOSPITAL_COMMUNITY): Payer: Medicaid Other

## 2020-04-20 IMAGING — DX DG ELBOW 2V*L*
2 series · 2 of 2 positions shown · non-contrast
Comparison: [DATE]

CLINICAL DATA: Post elbow repair

EXAM:
LEFT ELBOW - 2 VIEW

[elbow ap]
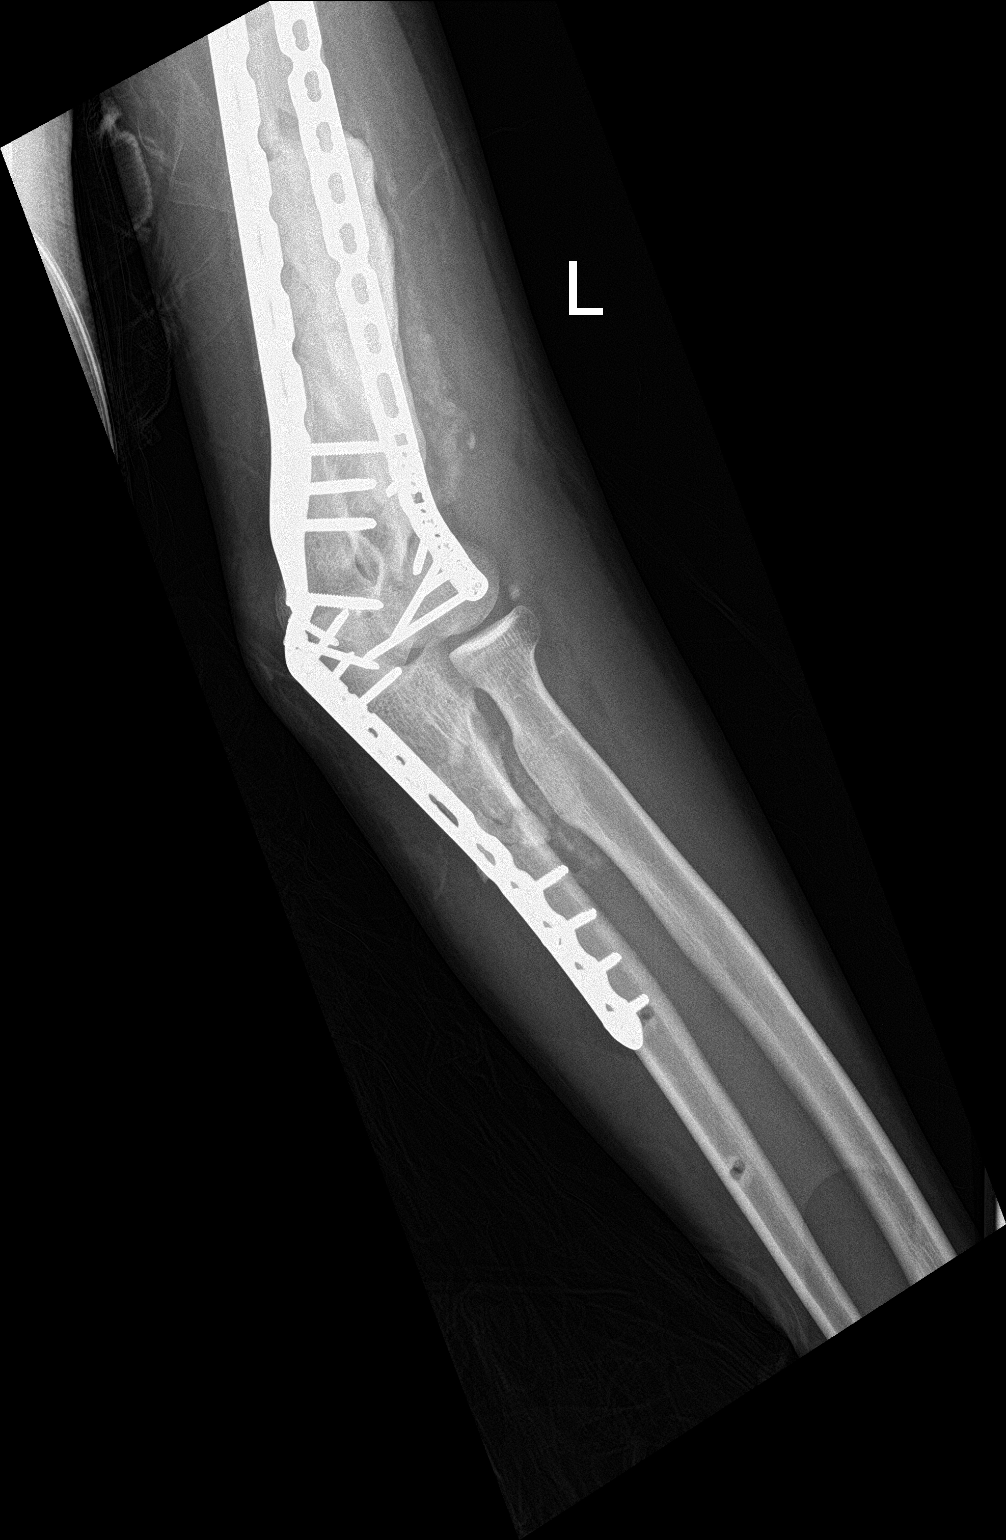

[elbow lat]
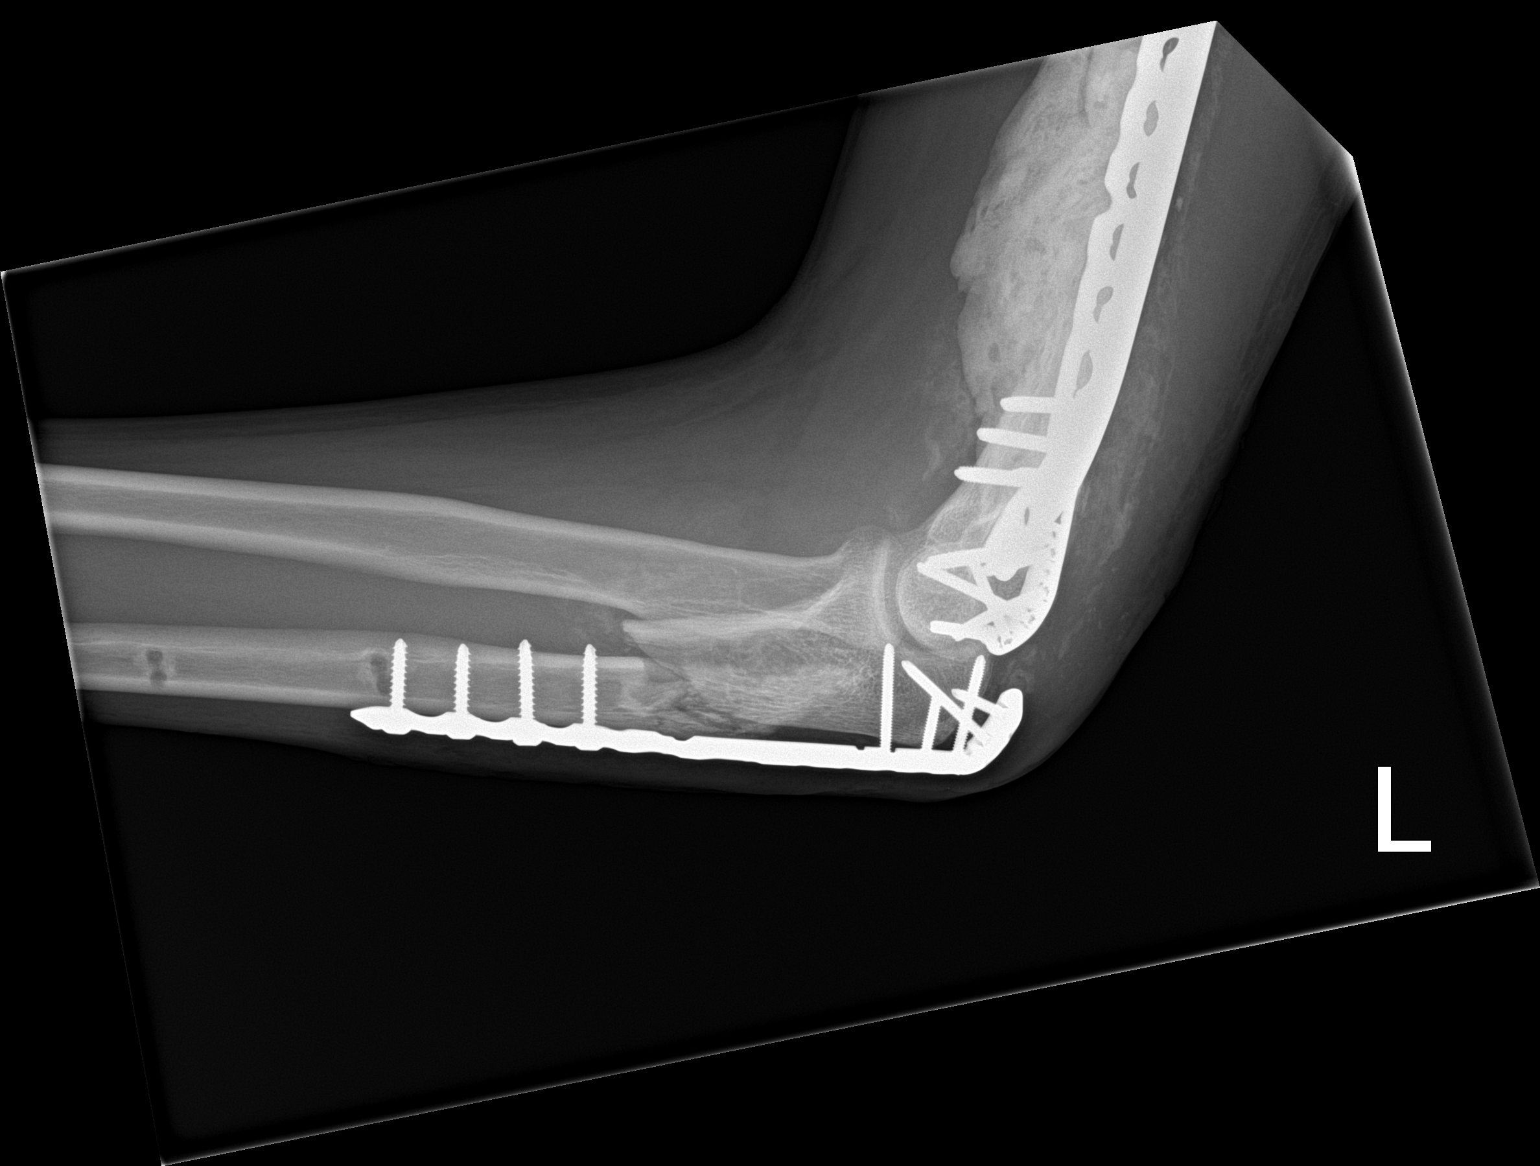

[2 of 2 positions shown; findings below may reference images not displayed]

FINDINGS: Plates and screws identified at LEFT humerus post ORIF.

Cement is present anteriorly at the distal LEFT humerus.

Additional plate and screws seen at the dorsal margin of the ulna
post ORIF of a minimally displaced oblique proximal ulnar
metadiaphyseal fracture, unchanged.

Radius appears intact.
IMPRESSION: Stable appearance of LEFT humeral and ulnar ORIF.

## 2020-04-20 IMAGING — DX DG PELVIS 3+V JUDET
3 series · 3 of 3 positions shown · non-contrast
Comparison: [DATE]

CLINICAL DATA: Post acetabular surgery

EXAM:
JUDET PELVIS - 3+ VIEW

[pelvis ap]
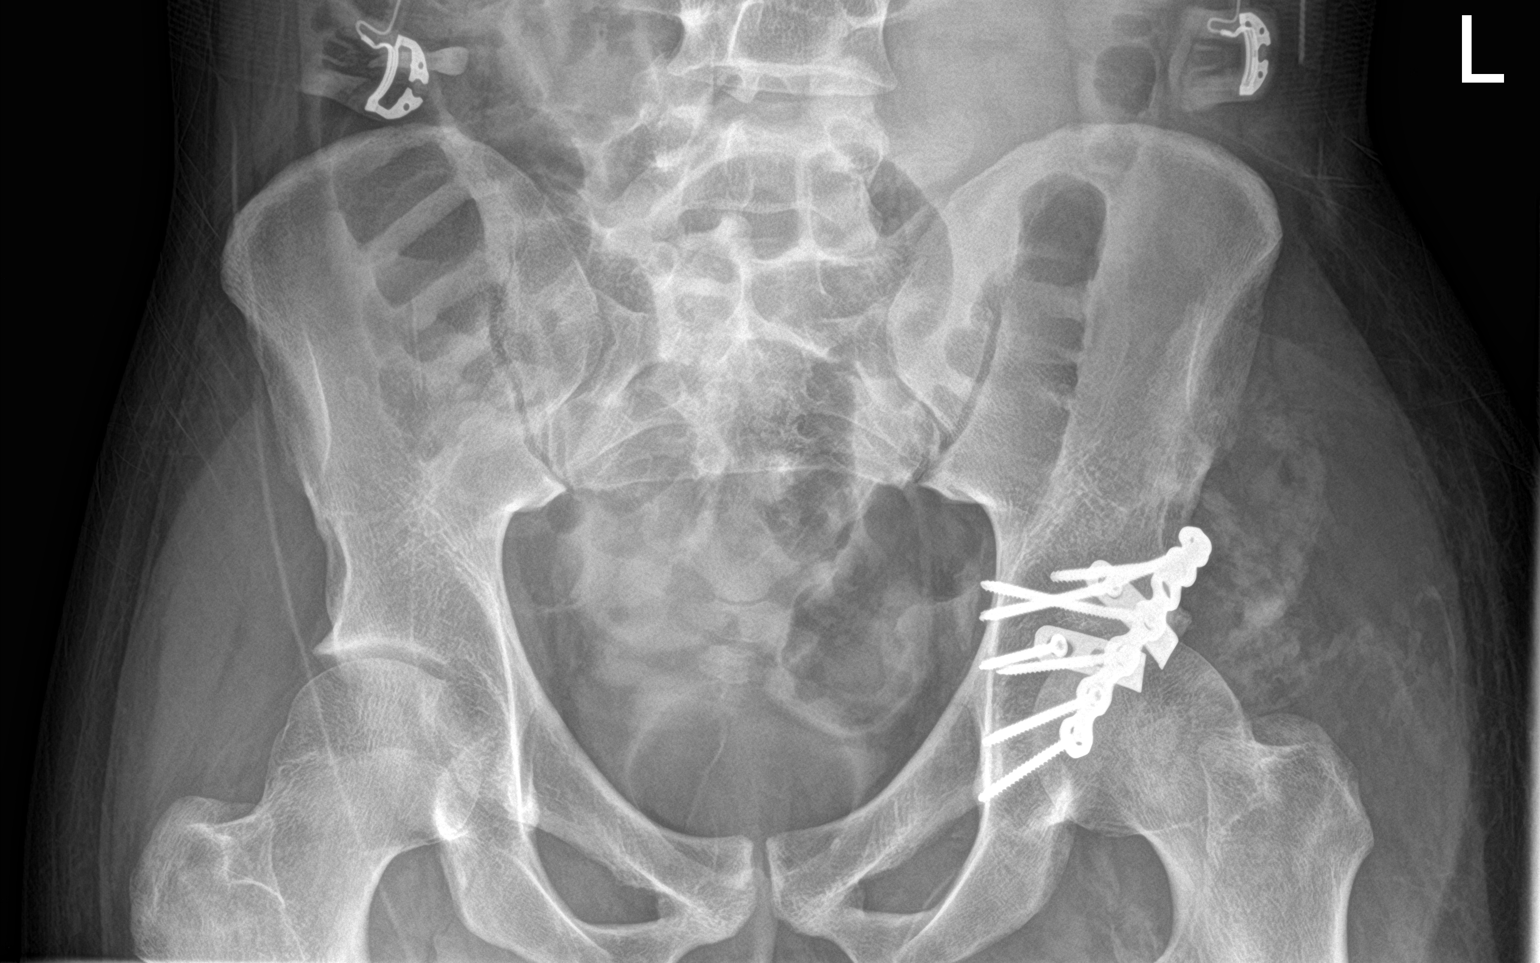

[pelvis obl (1 of 2)]
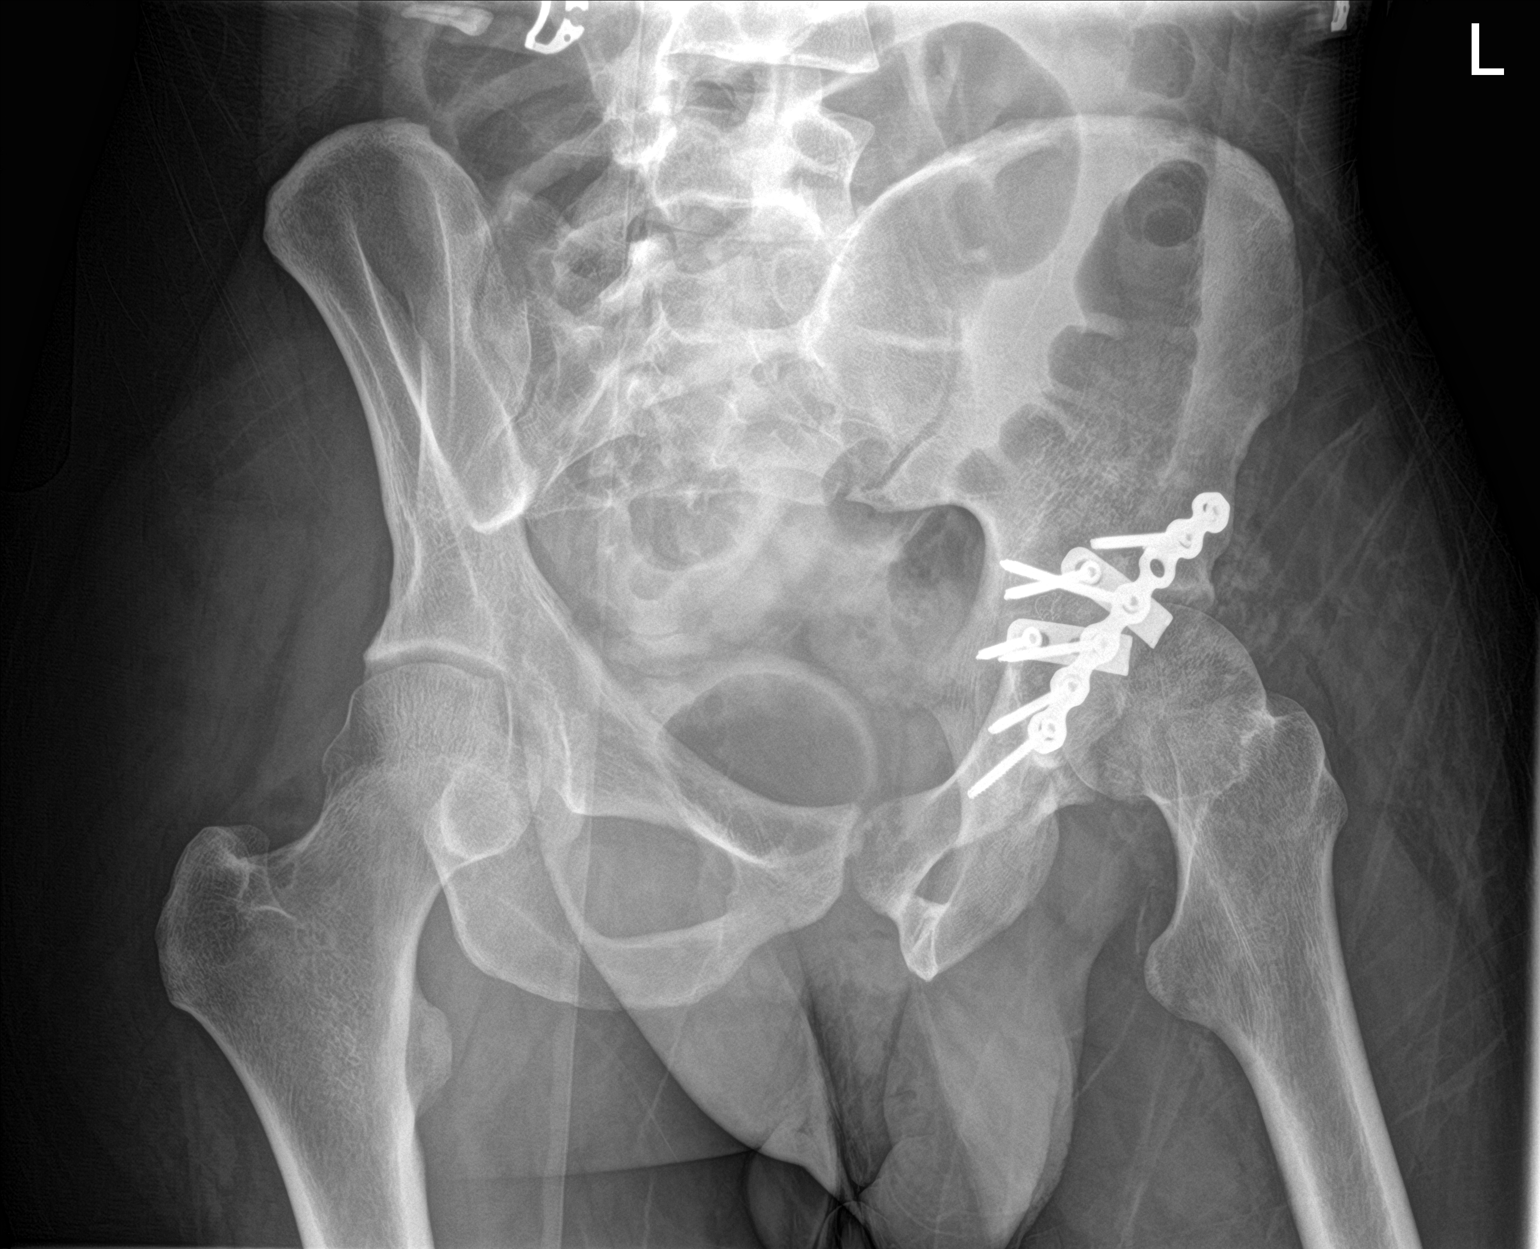

[pelvis obl (2 of 2)]
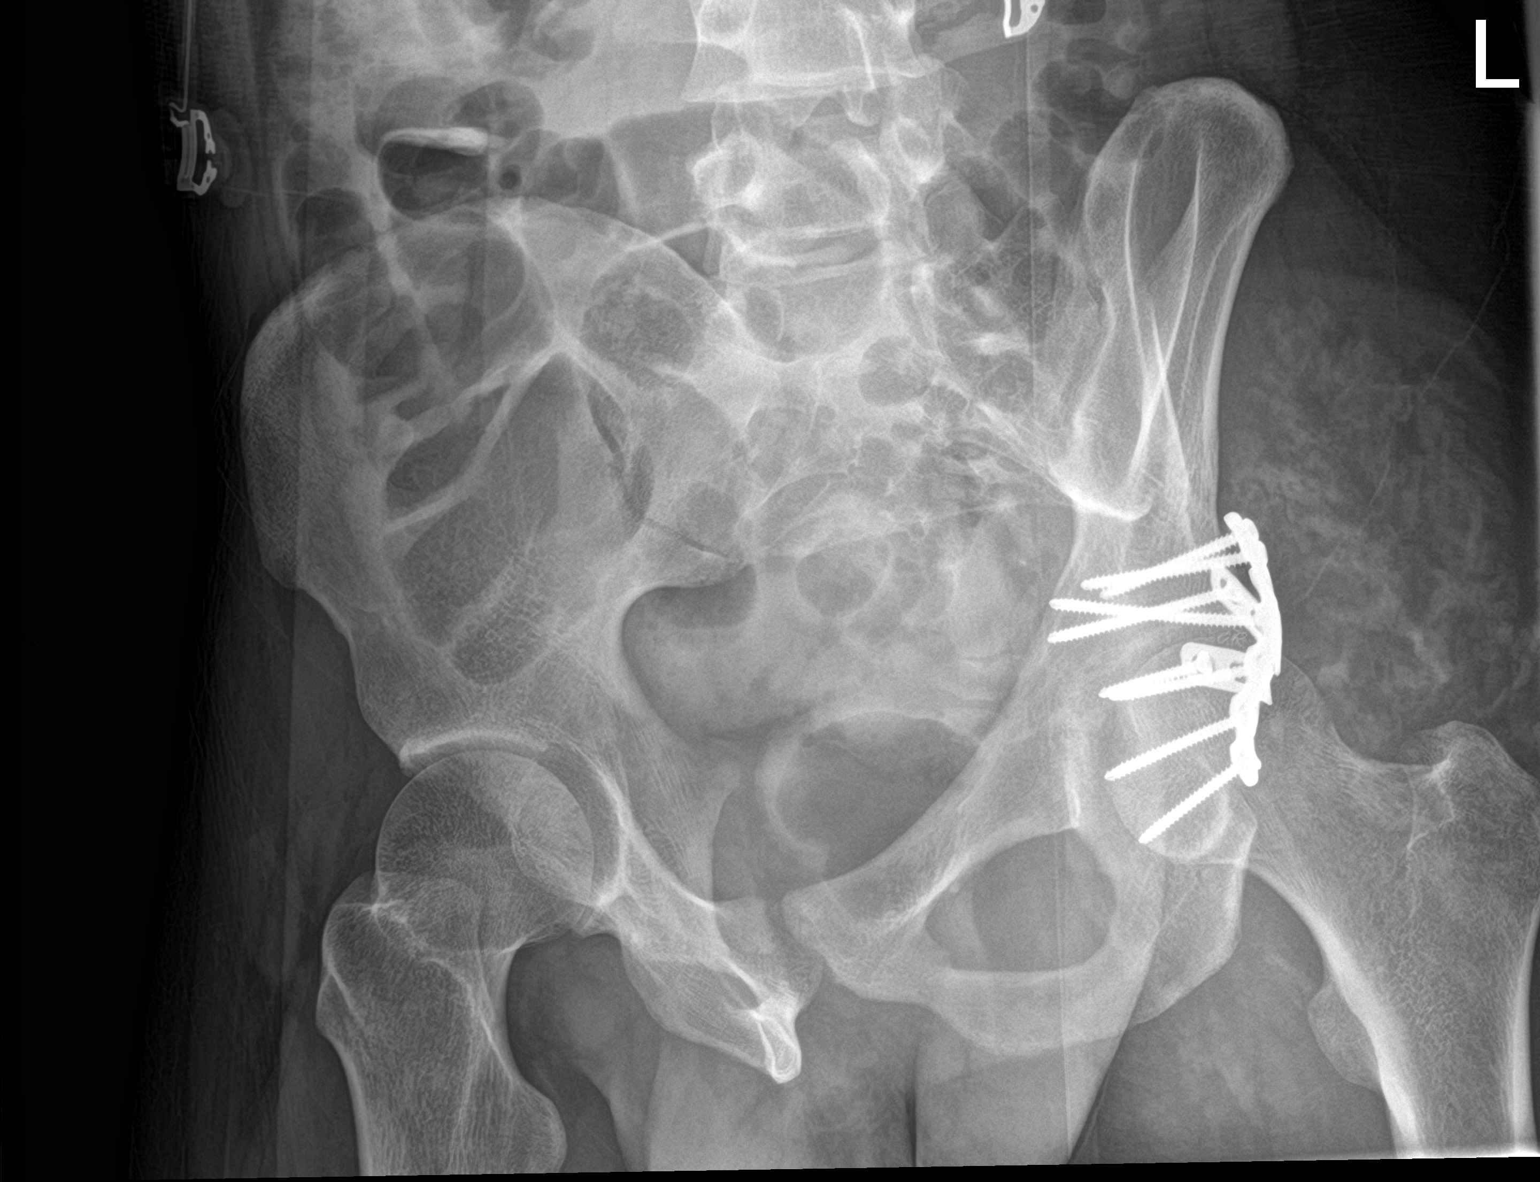

[3 of 3 positions shown; findings below may reference images not displayed]

FINDINGS: Osseous mineralization normal.

Hip and SI joint spaces preserved.

Prior LEFT acetabular repair.

No acute fracture or dislocation.
IMPRESSION: Post LEFT acetabular repair.

No additional osseous abnormalities.

## 2020-04-20 IMAGING — DX DG HUMERUS 2V *L*
2 series · 2 of 2 positions shown · non-contrast
Comparison: [DATE]

CLINICAL DATA: Post ORIF LEFT humerus

EXAM:
LEFT HUMERUS - 2+ VIEW

[humerus ap]
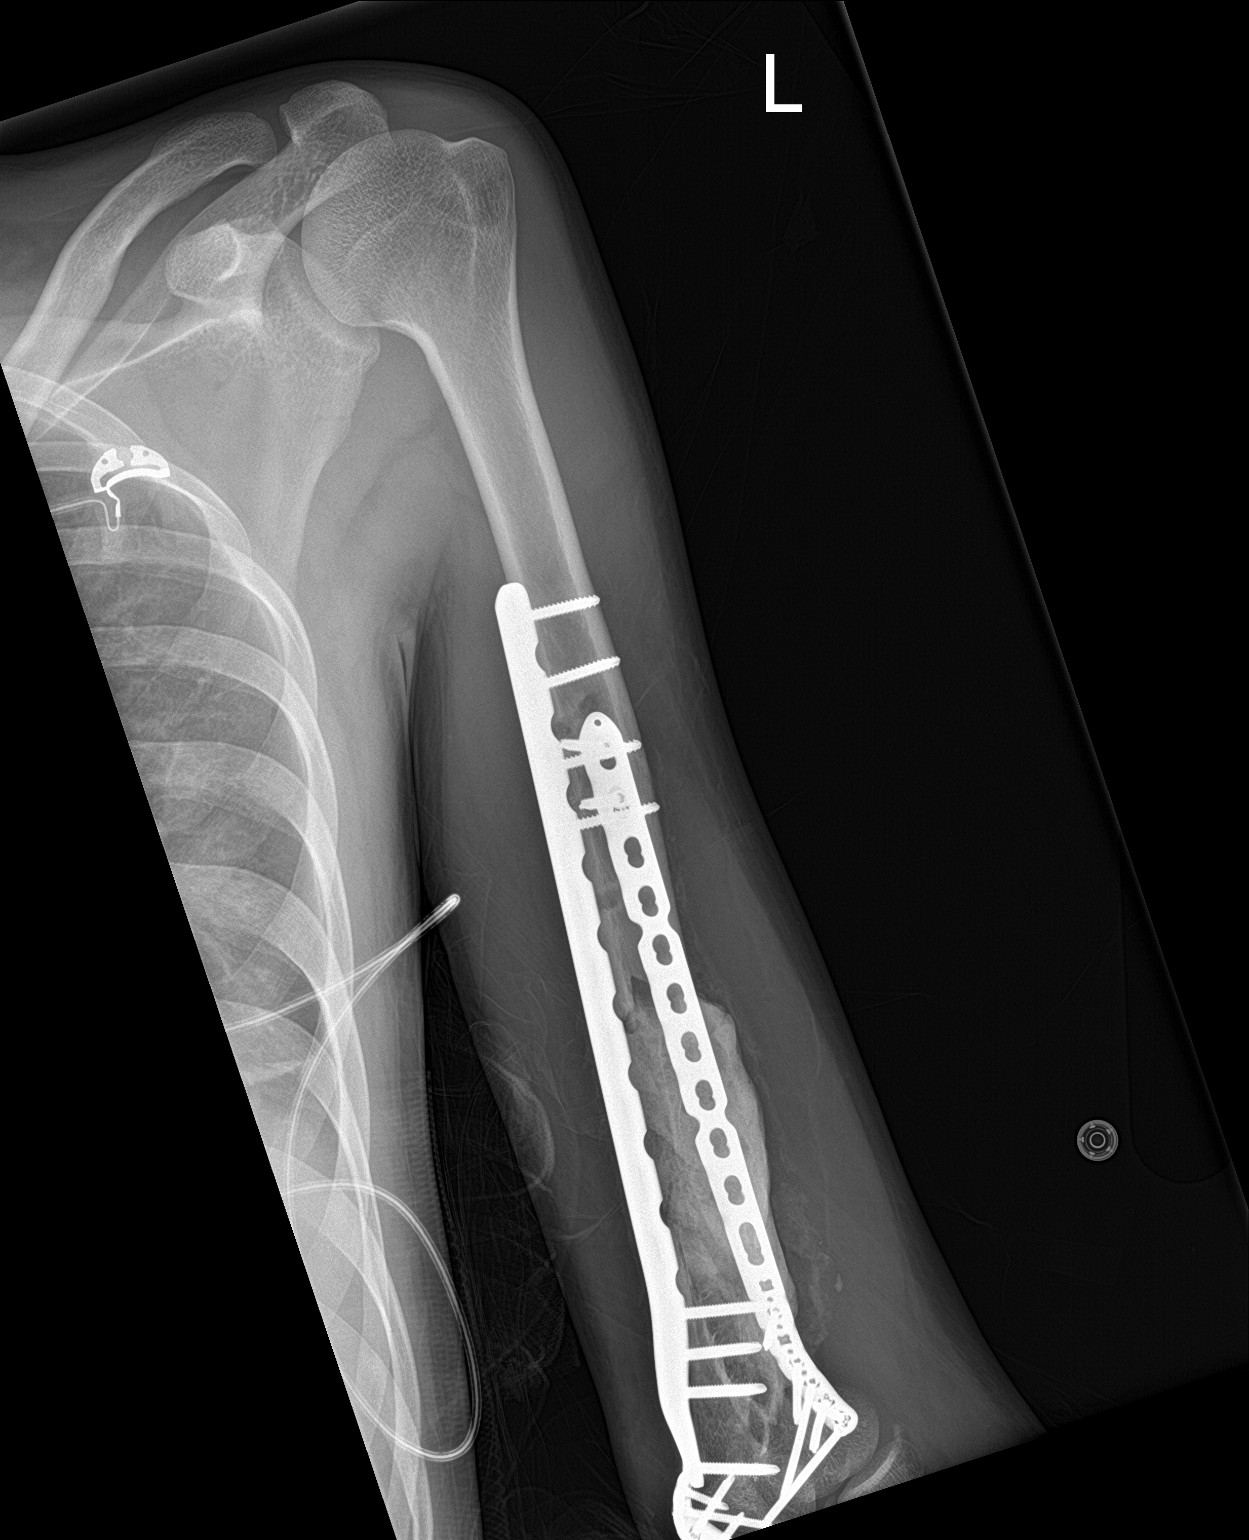

[humerus lat]
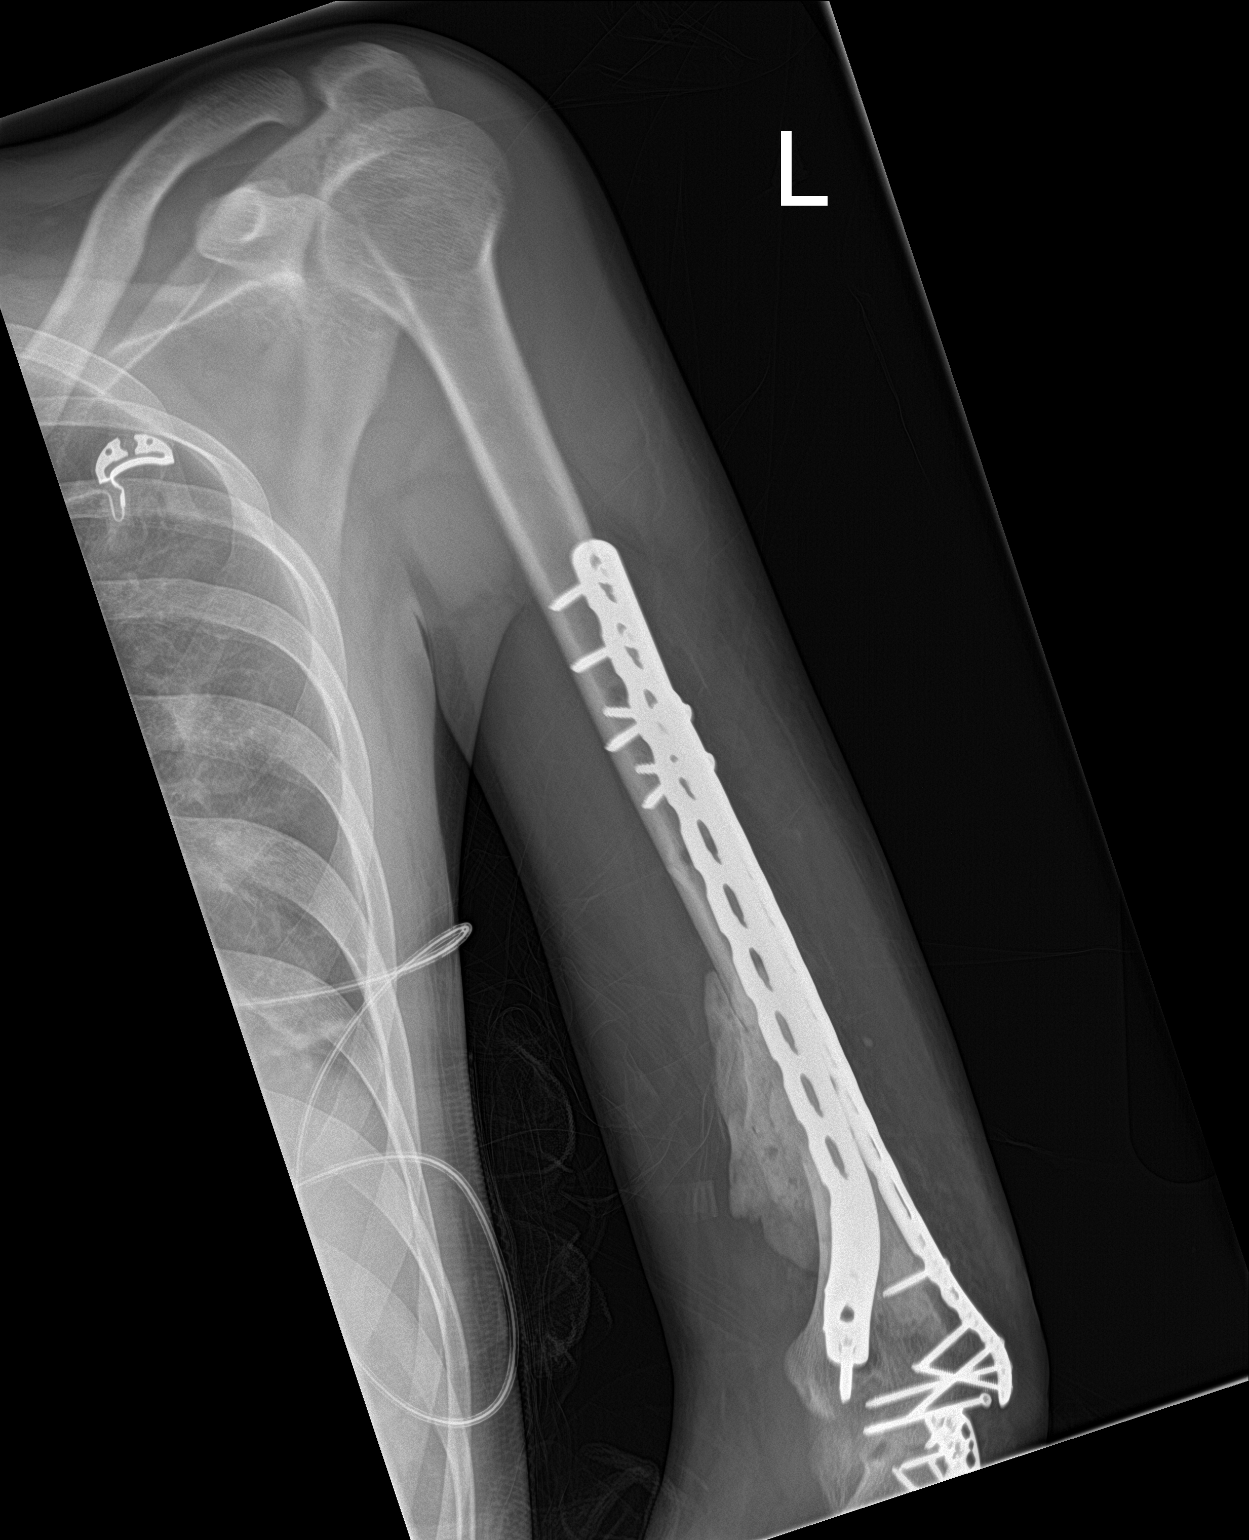

[2 of 2 positions shown; findings below may reference images not displayed]

FINDINGS: 2 plates and multiple screws are seen at the LEFT humerus post ORIF.

Methylmethacrylate present at anterior distal humerus.

Hardware appears intact.

No new fracture, dislocation or bone destruction.
IMPRESSION: Post ORIF of distal humerus.

No interval change.

## 2020-04-20 IMAGING — DX DG CLAVICLE*R*
2 series · 2 of 2 positions shown · non-contrast
Comparison: [DATE]

CLINICAL DATA: Post ORIF RIGHT clavicle

EXAM:
RIGHT CLAVICLE - 2+ VIEWS

[clavicle ap]
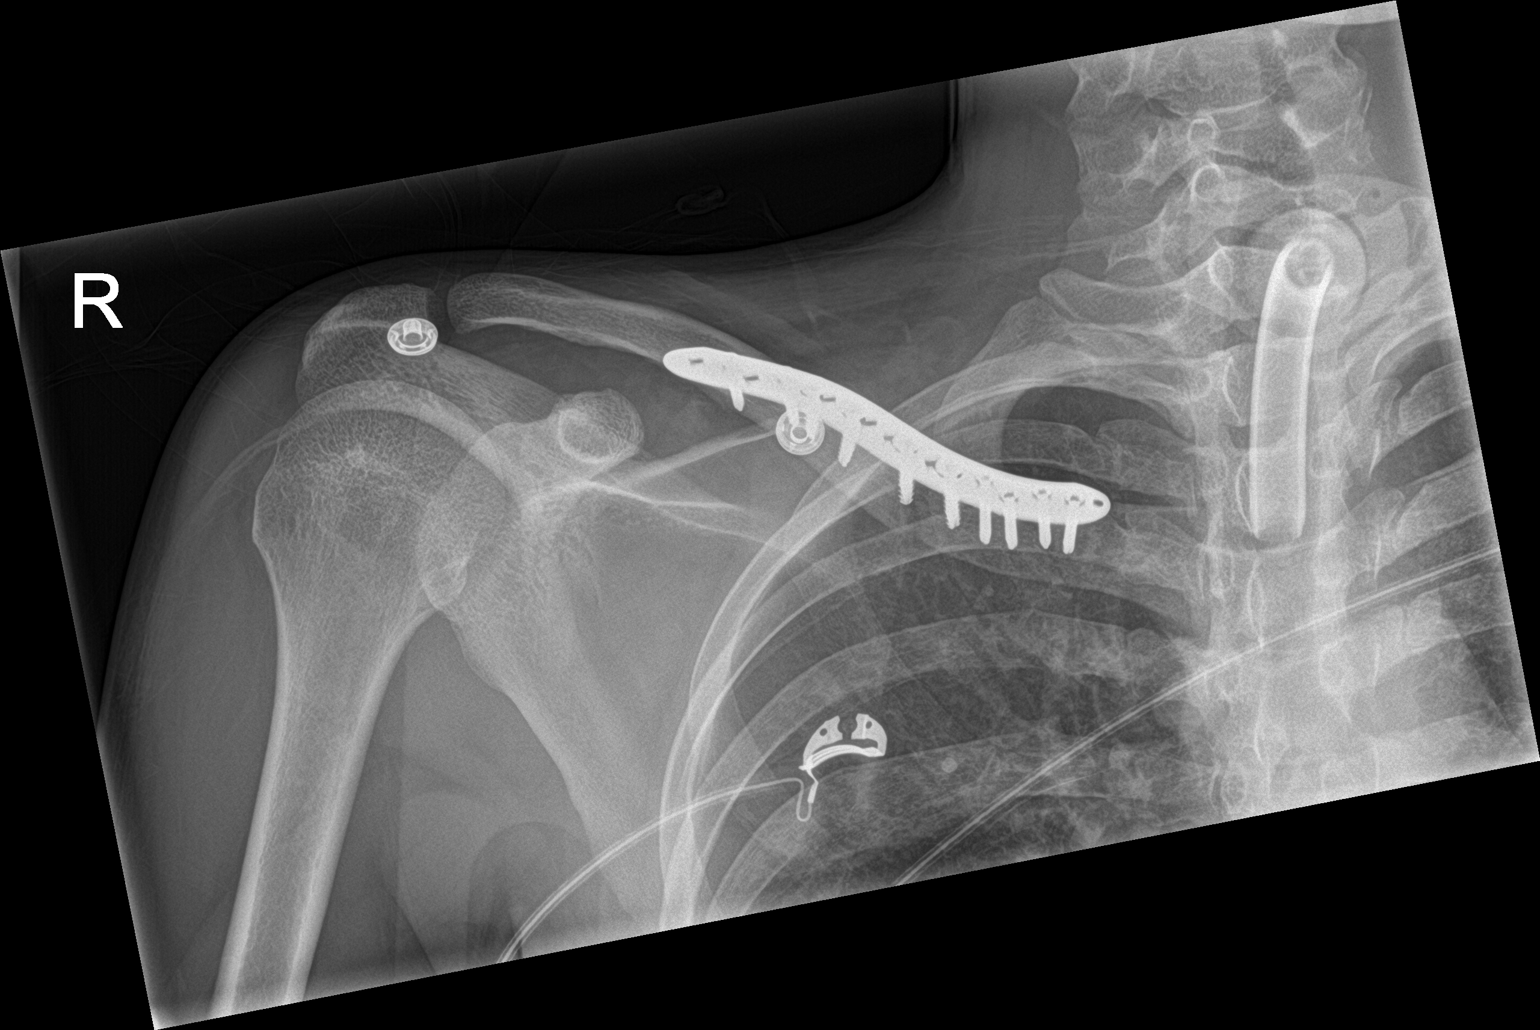

[clavicle axial]
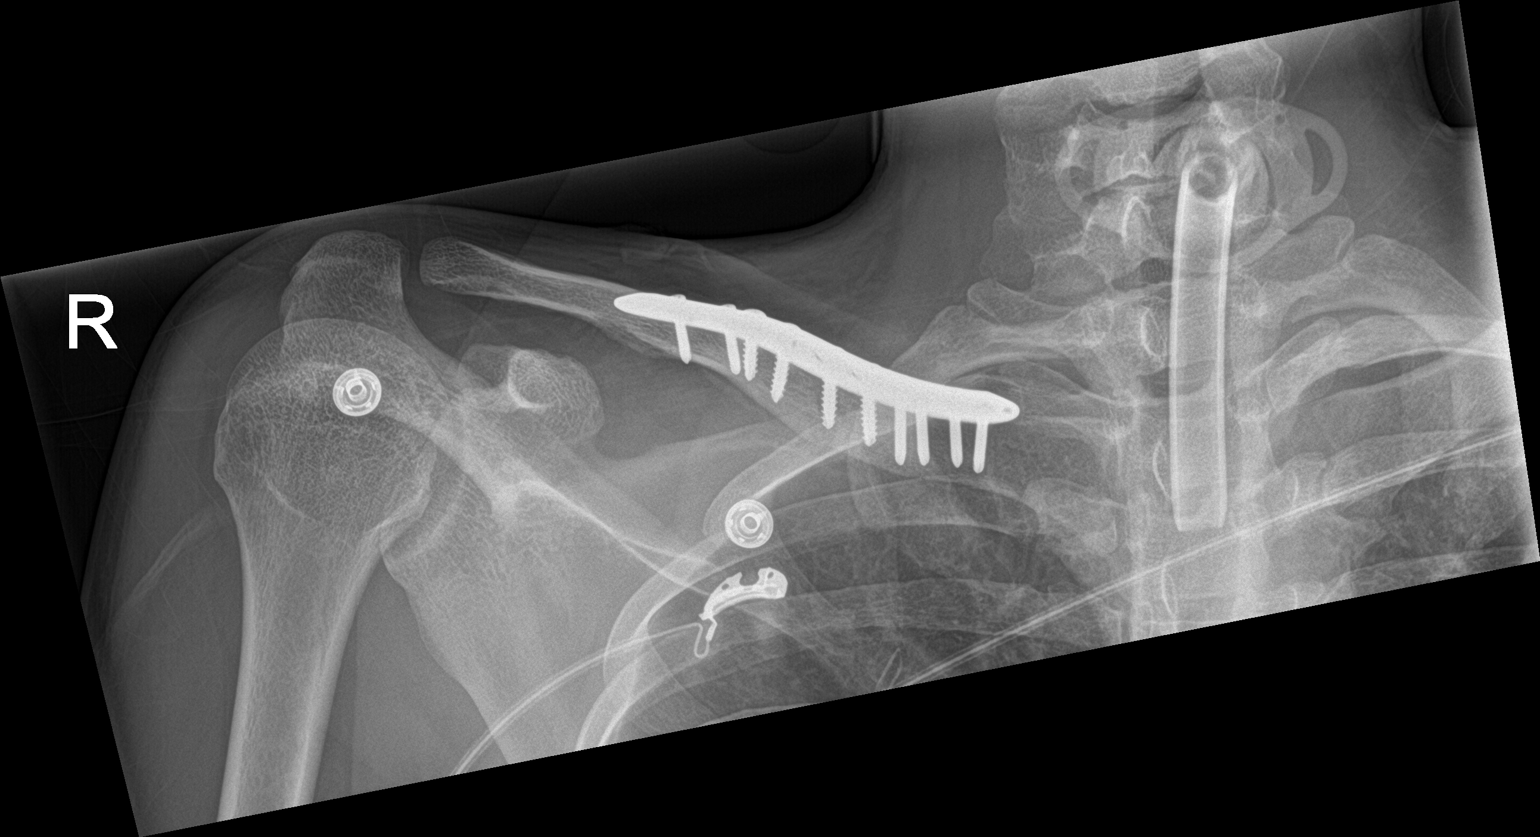

[2 of 2 positions shown; findings below may reference images not displayed]

FINDINGS: Tracheostomy tube present.

Plate and screws identified at RIGHT clavicle post ORIF over
fracture at the junction of the middle and distal thirds.

AC joint alignment normal.

Osseous mineralization normal.
IMPRESSION: Post ORIF RIGHT clavicle.
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
## 2020-04-20 NOTE — Progress Notes (Signed)
Patient agitated. Removed condom catheter twice. Kicks at nurses when they are attempting care. Swearing at nurses. Dr. Janee Morn notified. Verbal orders received for bilateral ankle restraints in addition to current restraints.
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
## 2020-04-21 MED ORDER — CLONIDINE HCL 0.1 MG PO TABS: 0.1000 mg | ORAL_TABLET | Freq: Every day | ORAL | Status: DC

## 2020-04-21 MED ORDER — CLONIDINE HCL 0.1 MG PO TABS
0.2000 mg | ORAL_TABLET | Freq: Four times a day (QID) | ORAL | Status: DC
  Administered 2020-04-24 – 2020-04-25 (×7): 0.2 mg
  Filled 2020-04-21 (×8): qty 2

## 2020-04-21 MED ORDER — CLONIDINE HCL 0.1 MG PO TABS
0.3000 mg | ORAL_TABLET | Freq: Four times a day (QID) | ORAL | Status: AC
  Administered 2020-04-22 – 2020-04-23 (×7): 0.3 mg
  Filled 2020-04-21 (×9): qty 3

## 2020-04-21 MED ORDER — CLONIDINE HCL 0.1 MG PO TABS: 0.1000 mg | ORAL_TABLET | Freq: Two times a day (BID) | ORAL | Status: DC

## 2020-04-21 MED ORDER — PIVOT 1.5 CAL PO LIQD
1000.0000 mL | ORAL | Status: DC
  Administered 2020-04-21 – 2020-04-27 (×3): 1000 mL
  Filled 2020-04-21 (×12): qty 1000

## 2020-04-21 MED ORDER — CLONIDINE HCL 0.1 MG PO TABS
0.1000 mg | ORAL_TABLET | Freq: Four times a day (QID) | ORAL | Status: DC
  Administered 2020-04-26 – 2020-04-27 (×6): 0.1 mg
  Filled 2020-04-21 (×6): qty 1
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
## 2020-04-21 NOTE — Progress Notes (Signed)
RT NOTE: orders were placed for patient's trach to be changed from #6 shiley to a #4 Shiley cuffless trach.  Upon arrival to patient room to see if trach had been brought up, RN informed RT that MD had already changed trach.  Patient's sats 100%.  Vitals are stable.  Spare trach at bedside.  Will continue to monitor.
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
## 2020-04-21 NOTE — Progress Notes (Signed)
RT received a call by RN stating that patient pulled trach out. RT immediately arrived bedside and placed trach back in with no difficulty. Color changed noted by RT x2. Patient sats remained 98%-100%. Patients trach cleansed, dried as well as inner cannula and patient placed back on ATC. Patient tolerating well at this time. No distress noted during events. RN at bedside. RT will continue to monitor.

## 2020-04-22 ENCOUNTER — Inpatient Hospital Stay (HOSPITAL_COMMUNITY): Payer: Medicaid Other

## 2020-04-22 IMAGING — DX DG ABDOMEN 1V
1 series · 1 of 1 positions shown · non-contrast
Comparison: None.

CLINICAL DATA: Peg tube placed

EXAM:
ABDOMEN - 1 VIEW

[abdomen supine]
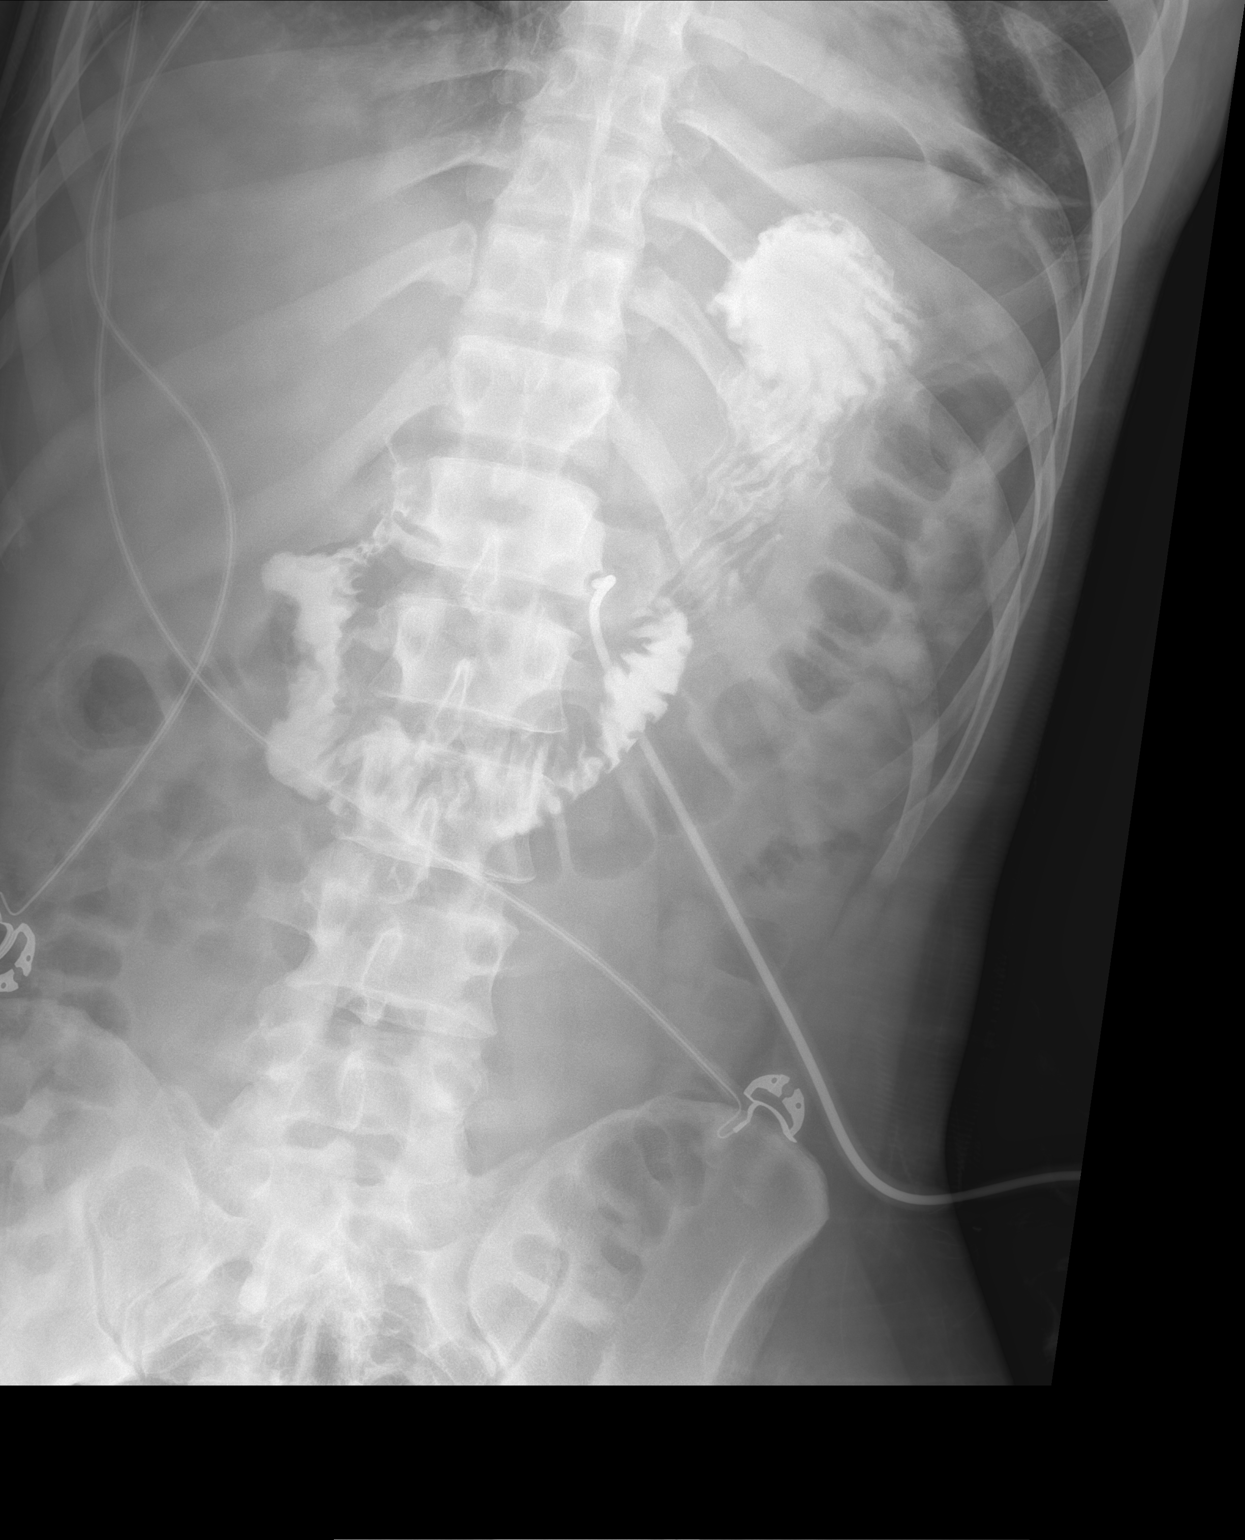

[1 of 1 positions shown; findings below may reference images not displayed]

FINDINGS: The bowel gas pattern is normal. Percutaneous gastrotomy tube seen
overlying the mid body of the stomach. Contrast seen within the body
of the stomach and duodenum. No radio-opaque calculi or other
significant radiographic abnormality are seen.
IMPRESSION: Percutaneous gastrotomy tube within the mid body of the stomach with
contrast seen in the stomach and duodenum.

## 2020-04-22 MED ORDER — MORPHINE SULFATE (PF) 2 MG/ML IV SOLN
2.0000 mg | INTRAVENOUS | Status: DC | PRN
  Filled 2020-04-22: qty 1

## 2020-04-22 MED ORDER — CLONAZEPAM 0.5 MG PO TABS
0.5000 mg | ORAL_TABLET | Freq: Three times a day (TID) | ORAL | Status: DC
  Administered 2020-04-27: 0.5 mg
  Filled 2020-04-22: qty 1

## 2020-04-22 MED ORDER — CLONAZEPAM 0.5 MG PO TABS: 0.5000 mg | ORAL_TABLET | Freq: Two times a day (BID) | ORAL | Status: DC

## 2020-04-22 MED ORDER — CLONAZEPAM 1 MG PO TABS
1.0000 mg | ORAL_TABLET | Freq: Three times a day (TID) | ORAL | Status: DC
  Administered 2020-04-23 – 2020-04-24 (×5): 1 mg
  Filled 2020-04-22 (×6): qty 1

## 2020-04-22 MED ORDER — CLONAZEPAM 0.5 MG PO TABS: 0.5000 mg | ORAL_TABLET | Freq: Every day | ORAL | Status: DC

## 2020-04-22 MED ORDER — DIATRIZOATE MEGLUMINE & SODIUM 66-10 % PO SOLN
32.0000 mL | Freq: Once | ORAL | Status: DC
  Filled 2020-04-22: qty 60

## 2020-04-22 MED ORDER — CLONAZEPAM 1 MG PO TABS
1.0000 mg | ORAL_TABLET | Freq: Two times a day (BID) | ORAL | Status: DC
  Administered 2020-04-25 – 2020-04-26 (×4): 1 mg
  Filled 2020-04-22 (×4): qty 1
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
## 2020-04-22 NOTE — Progress Notes (Signed)
Inpatient Rehab Admissions Coordinator:   Met with patient at the bedside with his mom and RN today.  Not agitated when I visited, but very restless, continues to reach for trach when wrists not restrained, though somewhat redirectable.  Alda Berthold states still unsure whether patient has insurance, but will work on finding out today.  Per trauma team, pt is approaching readiness for transition to CIR.  If insurance Josem Kaufmann is required I will start on that as soon as I have his information.  Otherwise, will work towards potential admission once a bed becomes available.    Shann Medal, PT, DPT Admissions Coordinator [phone] 04/22/20  12:25 PM
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
## 2020-04-22 NOTE — Progress Notes (Signed)
Pts trach capped per MD order. Pt is tolerating well at this time. RT to continue to monitor.
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
## 2020-04-22 NOTE — Progress Notes (Signed)
This nurse was in pt rm preparing medications for administration. Pt was moving around in the bed when this nurse noticed pt had freed the right arm from restraint and grabbed feeding tube and pulled. Provider made aware and they placed a foley to maintain placement. Xray obtained to ensure tube in proper place. Meds allowed to be administered via foley. Hold tube feeds. Mayford Knife RN

## 2020-04-22 NOTE — Progress Notes (Signed)
RN called RT due to patient pulling out his trach. RT inserted new trach with no complication. Patient's vitals remained stable throughout event. CO2 detector showed good color change and breath sounds are equal. RT will monitor as needed.

## 2020-04-23 ENCOUNTER — Inpatient Hospital Stay (HOSPITAL_COMMUNITY): Payer: Medicaid Other

## 2020-04-23 IMAGING — DX DG ABDOMEN 1V
1 series · 1 of 1 positions shown · non-contrast
Comparison: [DATE].

CLINICAL DATA: Gastrostomy tube evaluation.

EXAM:
ABDOMEN - 1 VIEW

[abdomen]
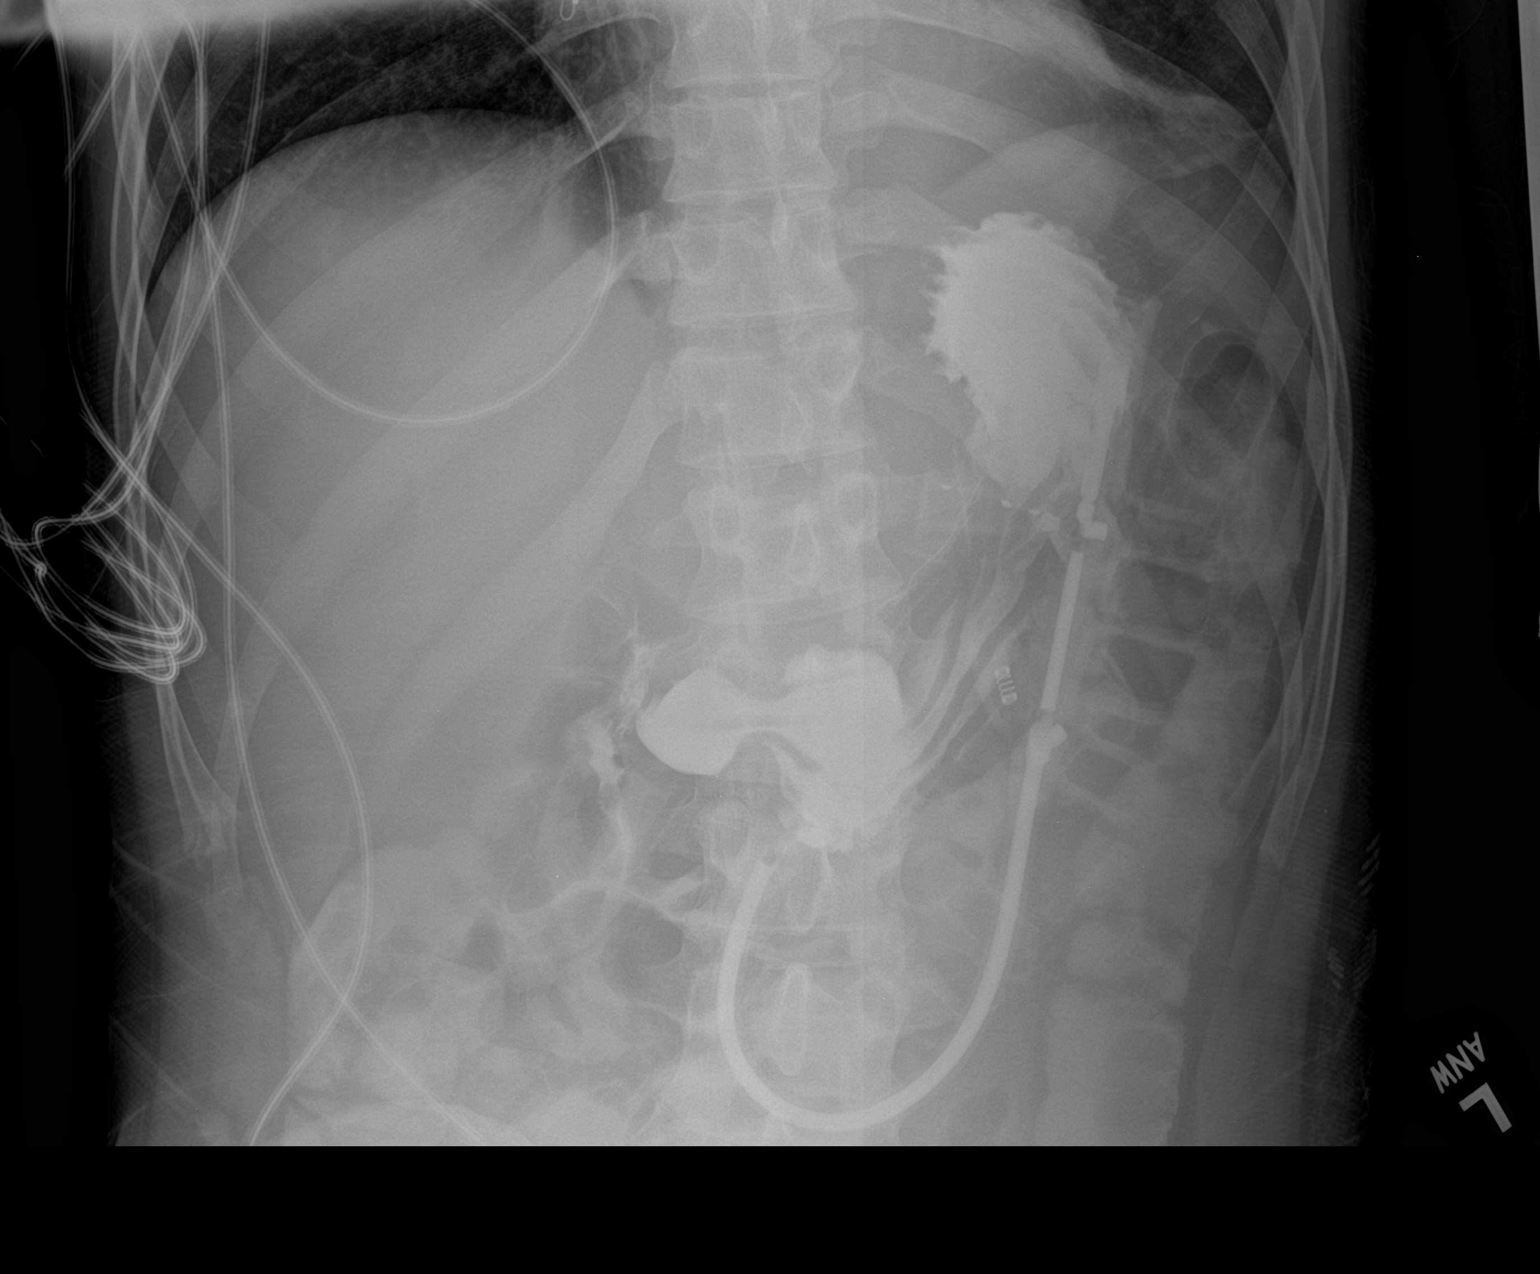

[1 of 1 positions shown; findings below may reference images not displayed]

FINDINGS: Gastrostomy tube noted with tip in the stomach. Contrast noted in
the stomach and duodenum. No gastric or bowel distention. No free
air. Left base atelectasis.
IMPRESSION: Gastrostomy tube noted with tip in the stomach.

## 2020-04-23 MED ORDER — IOHEXOL 300 MG/ML  SOLN
50.0000 mL | Freq: Once | INTRAMUSCULAR | Status: AC | PRN
  Administered 2020-04-23: 20 mL
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
## 2020-04-23 NOTE — Progress Notes (Signed)
Inpatient Rehab Admissions Coordinator:   I have no beds available for this patient to admit to CIR today.  Will continue to follow for timing of potential admission pending bed availability.   Estill Dooms, PT, DPT Admissions Coordinator [phone] 04/23/20  9:52 AM

## 2020-04-24 ENCOUNTER — Inpatient Hospital Stay (HOSPITAL_COMMUNITY): Payer: Medicaid Other

## 2020-04-24 LAB — BASIC METABOLIC PANEL
Anion gap: 11 (ref 5–15)
BUN: 28 mg/dL — ABNORMAL HIGH (ref 6–20)
CO2: 25 mmol/L (ref 22–32)
Calcium: 9.9 mg/dL (ref 8.9–10.3)
Chloride: 106 mmol/L (ref 98–111)
Creatinine, Ser: 0.66 mg/dL (ref 0.61–1.24)
GFR, Estimated: >60 mL/min (ref >60)
Glucose, Bld: 142 mg/dL — ABNORMAL HIGH (ref 70–99)
Potassium: 4.1 mmol/L (ref 3.5–5.1)
Sodium: 142 mmol/L (ref 135–145)

## 2020-04-24 LAB — GLUCOSE, CAPILLARY: Glucose-Capillary: 136 mg/dL — ABNORMAL HIGH (ref 70–99)

## 2020-04-24 IMAGING — DX DG ABDOMEN 1V
1 series · 1 of 1 positions shown · IV contrast (omnipaque)
Comparison: X-ray abdomen [DATE] [DATE] a.m.

CLINICAL DATA: Gastrostomy tube in place, 40 mL of Omnipaque given,
flushed with 40 mL of water after

EXAM:
ABDOMEN - 1 VIEW

[abdomen supine]
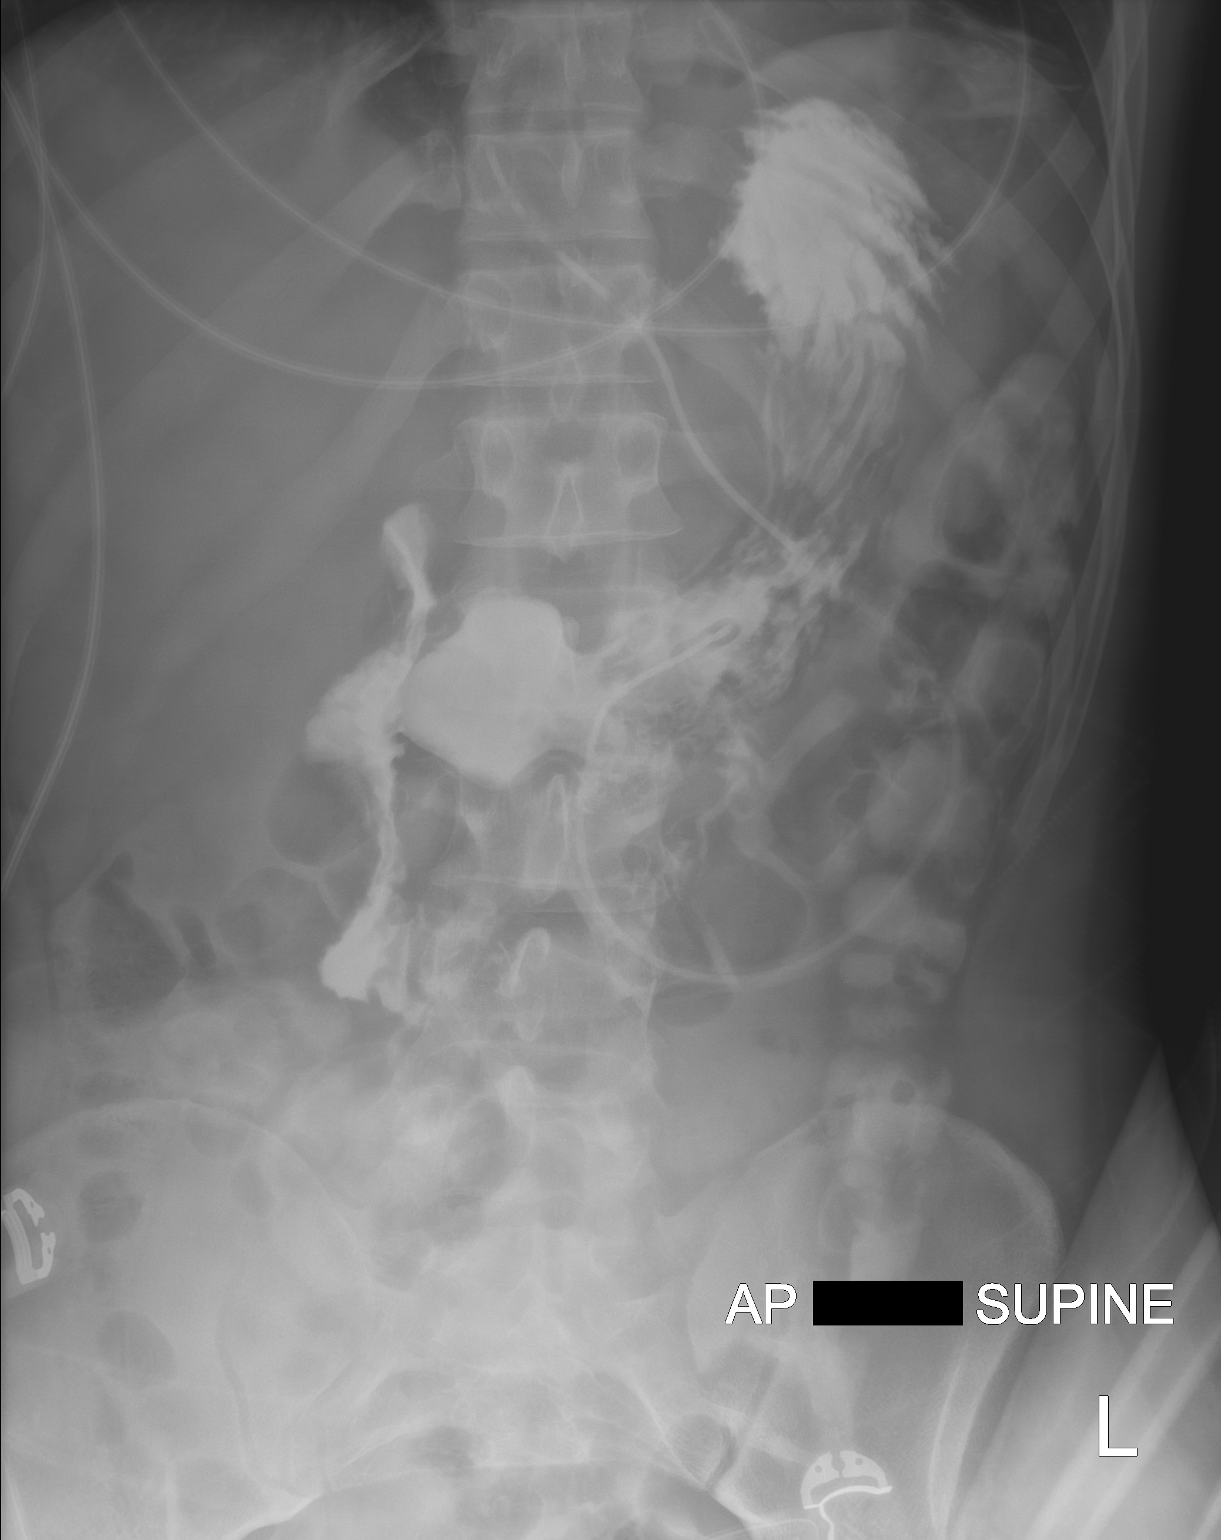

[1 of 1 positions shown; findings below may reference images not displayed]

FINDINGS: Tube noted overlying the left upper abdomen with tip overlying the
gastric lumen. PO contrast partially opacifies the gastric lumen as
well as the duodenum. No extravasation of PO contrast from the bowel
lumen noted. The bowel gas pattern is normal.
IMPRESSION: Gastrostomy tube in good position.

## 2020-04-24 MED ORDER — IOHEXOL 300 MG/ML  SOLN: 40.0000 mL | Freq: Once | INTRAMUSCULAR | Status: DC | PRN
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
## 2020-04-24 NOTE — Progress Notes (Signed)
Inpatient Rehab Admissions Coordinator:    I have a bed available for this pt to admit to CIR on Sunday (04/26/20) and trauma team in agreement.  Rehab MD (Dr. Wynn Banker) to assess pt and confirm admission on Sunday.  Floor RN can call CIR at [phone] for report after 12pm.  I will let pt/family and case manager know.   Estill Dooms, PT, DPT Admissions Coordinator [phone] 04/24/20  3:17 PM

## 2020-04-24 NOTE — Progress Notes (Signed)
Patient ID: Jeremy George, male   DOB: 12/07/1995, 25 y.o.   MRN: [id number] Notified by RN he pulled his trach and pulled his G tube despite binder. Doing fine with trach out. Was going to be decannulated soon anyway. I was able to place a foley in the G tube tract easily. Will check tube study and hold TF until then. I spoke with his mother.  Violeta Gelinas, MD, MPH, FACS Please use [email or web address] to contact on call provider 04/24/2020 5:10 PM
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
## 2020-04-24 NOTE — Progress Notes (Signed)
Called to patient room by mother that his peg was out. Entered the room and he was in wrist restraints and posey belt, had abdominal binder on and the peg was out. Upon looking at the patient he had also de cannulated his trach. Janina Mayo site was covered, new covering applied and his o2 Sat were 98%. MD to bedside shortly after and placed a foley in the PEG placement area. New orders received, trach left out d/t good o2 sats. Will continue to monitor.
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
## 2020-04-26 ENCOUNTER — Inpatient Hospital Stay (HOSPITAL_COMMUNITY): Payer: Medicaid Other

## 2020-04-26 DIAGNOSIS — R509 Fever, unspecified: Secondary | ICD-10-CM

## 2020-04-26 LAB — CBC
HCT: 39.4 % (ref 39.0–52.0)
Hemoglobin: 12.4 g/dL — ABNORMAL LOW (ref 13.0–17.0)
MCH: 27.2 pg (ref 26.0–34.0)
MCHC: 31.5 g/dL (ref 30.0–36.0)
MCV: 86.4 fL (ref 80.0–100.0)
Platelets: 594 10*3/uL — ABNORMAL HIGH (ref 150–400)
RBC: 4.56 MIL/uL (ref 4.22–5.81)
RDW: 14.1 % (ref 11.5–15.5)
WBC: 17.8 10*3/uL — ABNORMAL HIGH (ref 4.0–10.5)
nRBC: 0 % (ref 0.0–0.2)

## 2020-04-26 LAB — URINALYSIS, ROUTINE W REFLEX MICROSCOPIC
Bilirubin Urine: NEGATIVE
Glucose, UA: NEGATIVE mg/dL
Hgb urine dipstick: NEGATIVE
Ketones, ur: NEGATIVE mg/dL
Leukocytes,Ua: NEGATIVE
Nitrite: NEGATIVE
Protein, ur: NEGATIVE mg/dL
Specific Gravity, Urine: 1.03 (ref 1.005–1.030)
pH: 5 (ref 5.0–8.0)

## 2020-04-26 IMAGING — DX DG CHEST 1V PORT
1 series · 1 of 1 positions shown · non-contrast
Comparison: [DATE]

CLINICAL DATA: Leukocytosis

EXAM:
PORTABLE CHEST 1 VIEW

[chest]
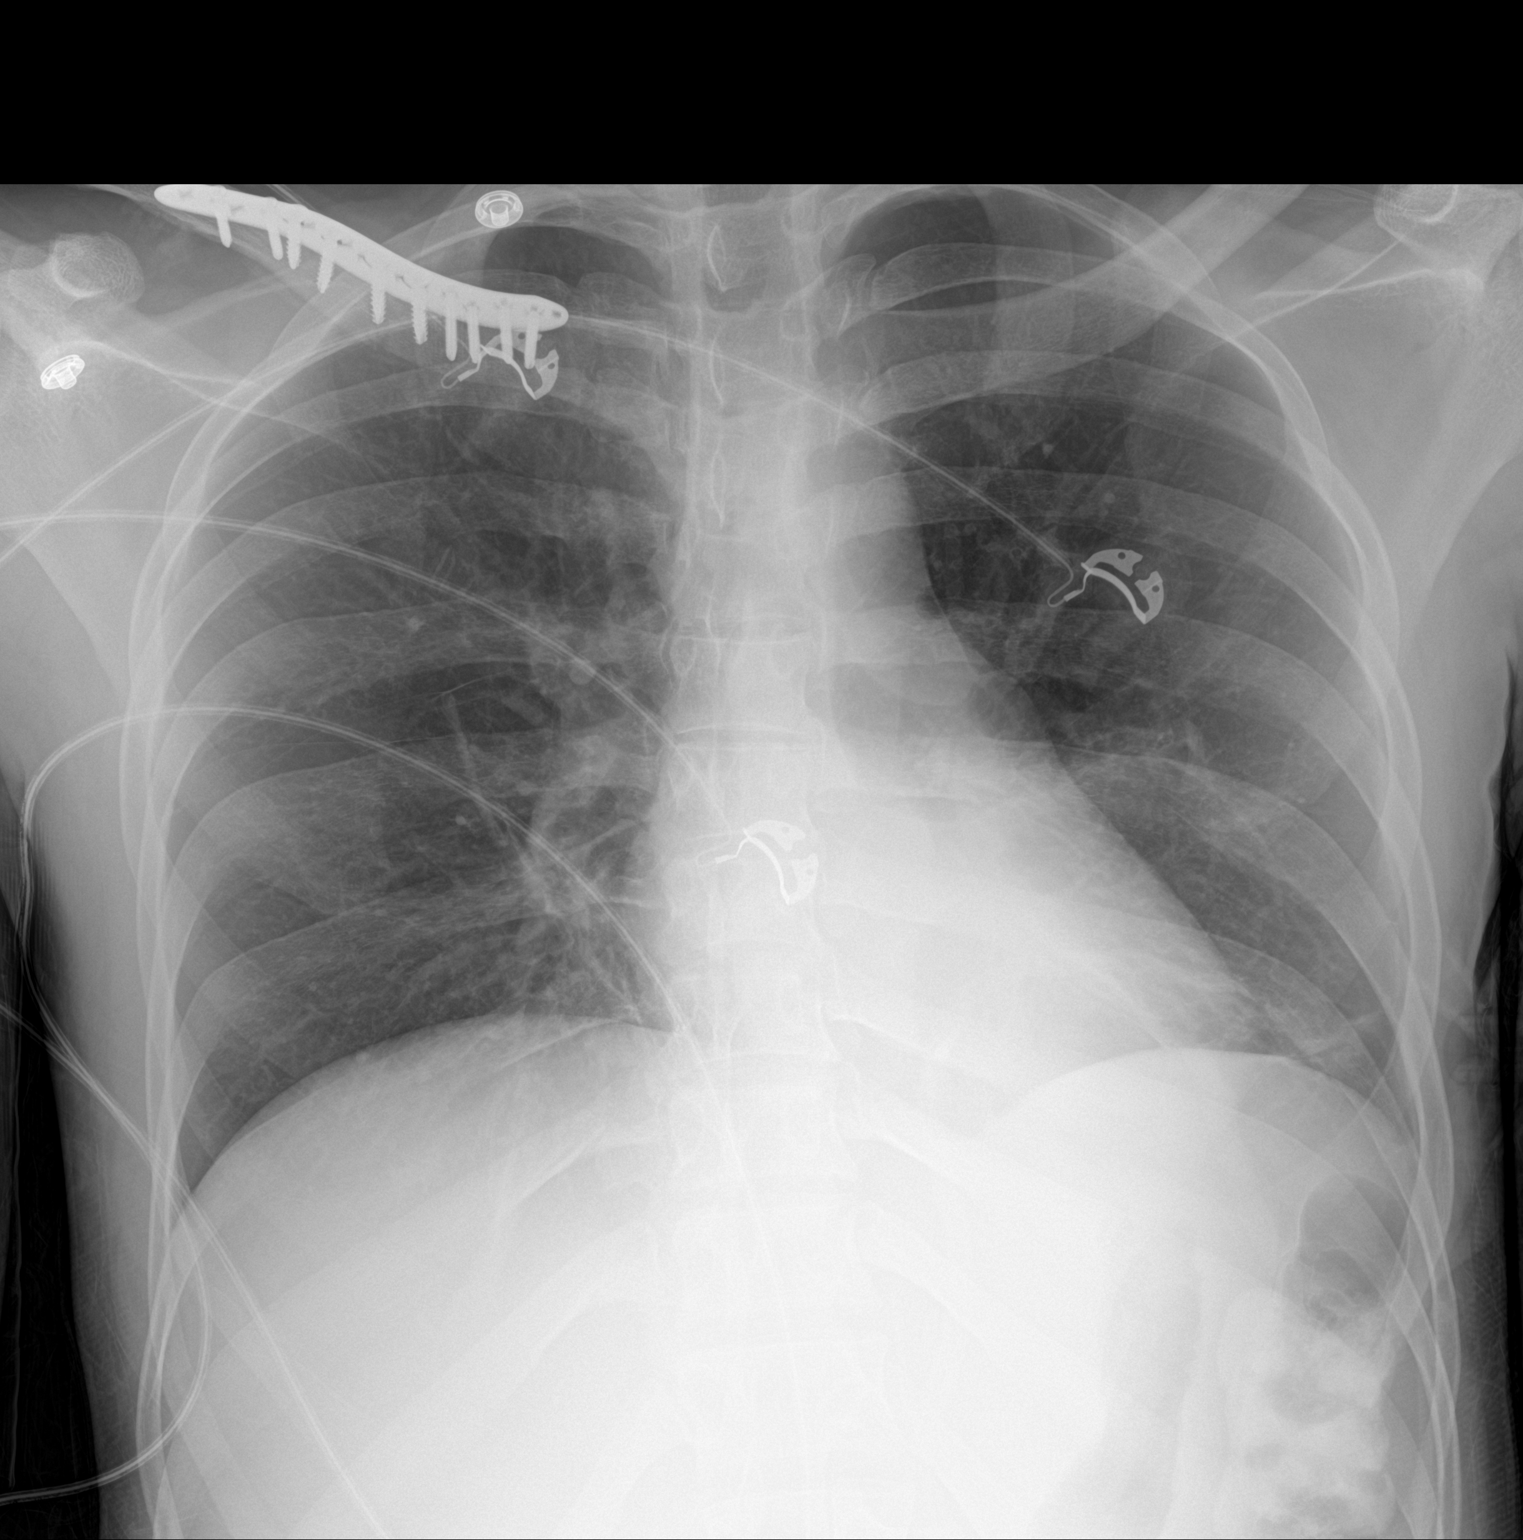

[1 of 1 positions shown; findings below may reference images not displayed]

FINDINGS: The cardiomediastinal silhouette is normal in contour. No pleural
effusion. No pneumothorax. Linear LEFT retrocardiac opacity most
consistent with atelectasis. Visualized abdomen is unremarkable.
Status post ORIF of the RIGHT clavicle.
IMPRESSION: Linear LEFT retrocardiac opacity most consistent with atelectasis.
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
## 2020-04-27 ENCOUNTER — Encounter (HOSPITAL_COMMUNITY): Payer: Self-pay | Admitting: Physical Medicine & Rehabilitation

## 2020-04-27 ENCOUNTER — Inpatient Hospital Stay (HOSPITAL_COMMUNITY)
Admission: RE | Admit: 2020-04-27 | Discharge: 2020-06-02 | DRG: 940 | Disposition: A | Discharge status: Home Health | Payer: Medicaid Other | Source: Intra-hospital | Attending: Physical Medicine & Rehabilitation | Admitting: Physical Medicine & Rehabilitation

## 2020-04-27 ENCOUNTER — Other Ambulatory Visit: Payer: Self-pay

## 2020-04-27 ENCOUNTER — Inpatient Hospital Stay (HOSPITAL_COMMUNITY): Payer: Medicaid Other

## 2020-04-27 DIAGNOSIS — Z930 Tracheostomy status: Secondary | ICD-10-CM | POA: Diagnosis not present

## 2020-04-27 DIAGNOSIS — L89812 Pressure ulcer of head, stage 2: Secondary | ICD-10-CM | POA: Diagnosis present

## 2020-04-27 DIAGNOSIS — S062X9D Diffuse traumatic brain injury with loss of consciousness of unspecified duration, subsequent encounter: Secondary | ICD-10-CM | POA: Diagnosis not present

## 2020-04-27 DIAGNOSIS — S42412B Displaced simple supracondylar fracture without intercondylar fracture of left humerus, initial encounter for open fracture: Secondary | ICD-10-CM | POA: Diagnosis not present

## 2020-04-27 DIAGNOSIS — S02412D LeFort II fracture, subsequent encounter for fracture with routine healing: Secondary | ICD-10-CM

## 2020-04-27 DIAGNOSIS — S069X9A Unspecified intracranial injury with loss of consciousness of unspecified duration, initial encounter: Secondary | ICD-10-CM | POA: Diagnosis present

## 2020-04-27 DIAGNOSIS — S73005D Unspecified dislocation of left hip, subsequent encounter: Secondary | ICD-10-CM | POA: Diagnosis not present

## 2020-04-27 DIAGNOSIS — S42412K Displaced simple supracondylar fracture without intercondylar fracture of left humerus, subsequent encounter for fracture with nonunion: Secondary | ICD-10-CM | POA: Diagnosis not present

## 2020-04-27 DIAGNOSIS — J939 Pneumothorax, unspecified: Secondary | ICD-10-CM | POA: Diagnosis not present

## 2020-04-27 DIAGNOSIS — Z931 Gastrostomy status: Secondary | ICD-10-CM

## 2020-04-27 DIAGNOSIS — R000 Tachycardia, unspecified: Secondary | ICD-10-CM

## 2020-04-27 DIAGNOSIS — S42402K Unspecified fracture of lower end of left humerus, subsequent encounter for fracture with nonunion: Secondary | ICD-10-CM | POA: Diagnosis not present

## 2020-04-27 DIAGNOSIS — S270XXA Traumatic pneumothorax, initial encounter: Secondary | ICD-10-CM | POA: Diagnosis not present

## 2020-04-27 DIAGNOSIS — G8918 Other acute postprocedural pain: Secondary | ICD-10-CM | POA: Diagnosis not present

## 2020-04-27 DIAGNOSIS — R1310 Dysphagia, unspecified: Secondary | ICD-10-CM | POA: Diagnosis not present

## 2020-04-27 DIAGNOSIS — E559 Vitamin D deficiency, unspecified: Secondary | ICD-10-CM | POA: Diagnosis not present

## 2020-04-27 DIAGNOSIS — D75839 Thrombocytosis, unspecified: Secondary | ICD-10-CM

## 2020-04-27 DIAGNOSIS — S069XAS Unspecified intracranial injury with loss of consciousness status unknown, sequela: Secondary | ICD-10-CM | POA: Diagnosis present

## 2020-04-27 DIAGNOSIS — T148XXA Other injury of unspecified body region, initial encounter: Secondary | ICD-10-CM

## 2020-04-27 DIAGNOSIS — S022XXD Fracture of nasal bones, subsequent encounter for fracture with routine healing: Secondary | ICD-10-CM

## 2020-04-27 DIAGNOSIS — D62 Acute posthemorrhagic anemia: Secondary | ICD-10-CM | POA: Diagnosis not present

## 2020-04-27 DIAGNOSIS — S32059D Unspecified fracture of fifth lumbar vertebra, subsequent encounter for fracture with routine healing: Secondary | ICD-10-CM | POA: Diagnosis not present

## 2020-04-27 DIAGNOSIS — S06A0XD Traumatic brain compression without herniation, subsequent encounter: Secondary | ICD-10-CM | POA: Diagnosis not present

## 2020-04-27 DIAGNOSIS — S42422B Displaced comminuted supracondylar fracture without intercondylar fracture of left humerus, initial encounter for open fracture: Secondary | ICD-10-CM | POA: Diagnosis not present

## 2020-04-27 DIAGNOSIS — S42001D Fracture of unspecified part of right clavicle, subsequent encounter for fracture with routine healing: Secondary | ICD-10-CM

## 2020-04-27 DIAGNOSIS — E43 Unspecified severe protein-calorie malnutrition: Secondary | ICD-10-CM | POA: Insufficient documentation

## 2020-04-27 DIAGNOSIS — Z23 Encounter for immunization: Secondary | ICD-10-CM | POA: Diagnosis not present

## 2020-04-27 DIAGNOSIS — M25629 Stiffness of unspecified elbow, not elsewhere classified: Secondary | ICD-10-CM

## 2020-04-27 DIAGNOSIS — S069XAA Unspecified intracranial injury with loss of consciousness status unknown, initial encounter: Secondary | ICD-10-CM

## 2020-04-27 DIAGNOSIS — T1490XA Injury, unspecified, initial encounter: Secondary | ICD-10-CM

## 2020-04-27 DIAGNOSIS — Z20822 Contact with and (suspected) exposure to covid-19: Secondary | ICD-10-CM | POA: Diagnosis not present

## 2020-04-27 DIAGNOSIS — S062X9A Diffuse traumatic brain injury with loss of consciousness of unspecified duration, initial encounter: Secondary | ICD-10-CM | POA: Diagnosis not present

## 2020-04-27 HISTORY — PX: IR REPLC GASTRO/COLONIC TUBE PERCUT W/FLUORO: IMG2333

## 2020-04-27 LAB — CBC
HCT: 36.8 % — ABNORMAL LOW (ref 39.0–52.0)
Hemoglobin: 12 g/dL — ABNORMAL LOW (ref 13.0–17.0)
MCH: 28 pg (ref 26.0–34.0)
MCHC: 32.6 g/dL (ref 30.0–36.0)
MCV: 85.8 fL (ref 80.0–100.0)
Platelets: 567 10*3/uL — ABNORMAL HIGH (ref 150–400)
RBC: 4.29 MIL/uL (ref 4.22–5.81)
RDW: 14.4 % (ref 11.5–15.5)
WBC: 19.9 10*3/uL — ABNORMAL HIGH (ref 4.0–10.5)
nRBC: 0 % (ref 0.0–0.2)

## 2020-04-27 LAB — URINE CULTURE: Culture: NO GROWTH

## 2020-04-27 LAB — GLUCOSE, CAPILLARY: Glucose-Capillary: 128 mg/dL — ABNORMAL HIGH (ref 70–99)

## 2020-04-27 LAB — CREATININE, SERUM
Creatinine, Ser: 0.86 mg/dL (ref 0.61–1.24)
GFR, Estimated: >60 mL/min (ref >60)

## 2020-04-27 IMAGING — XA IR REPLACE G-TUBE/COLONIC TUBE
1 series · 2 of 2 positions shown · non-contrast
Comparison: none

INDICATION: Dysphagia, feeding tube malfunction

[Series 1: fl (-) angio · 2 of 2 slices shown]
[im 1/2]
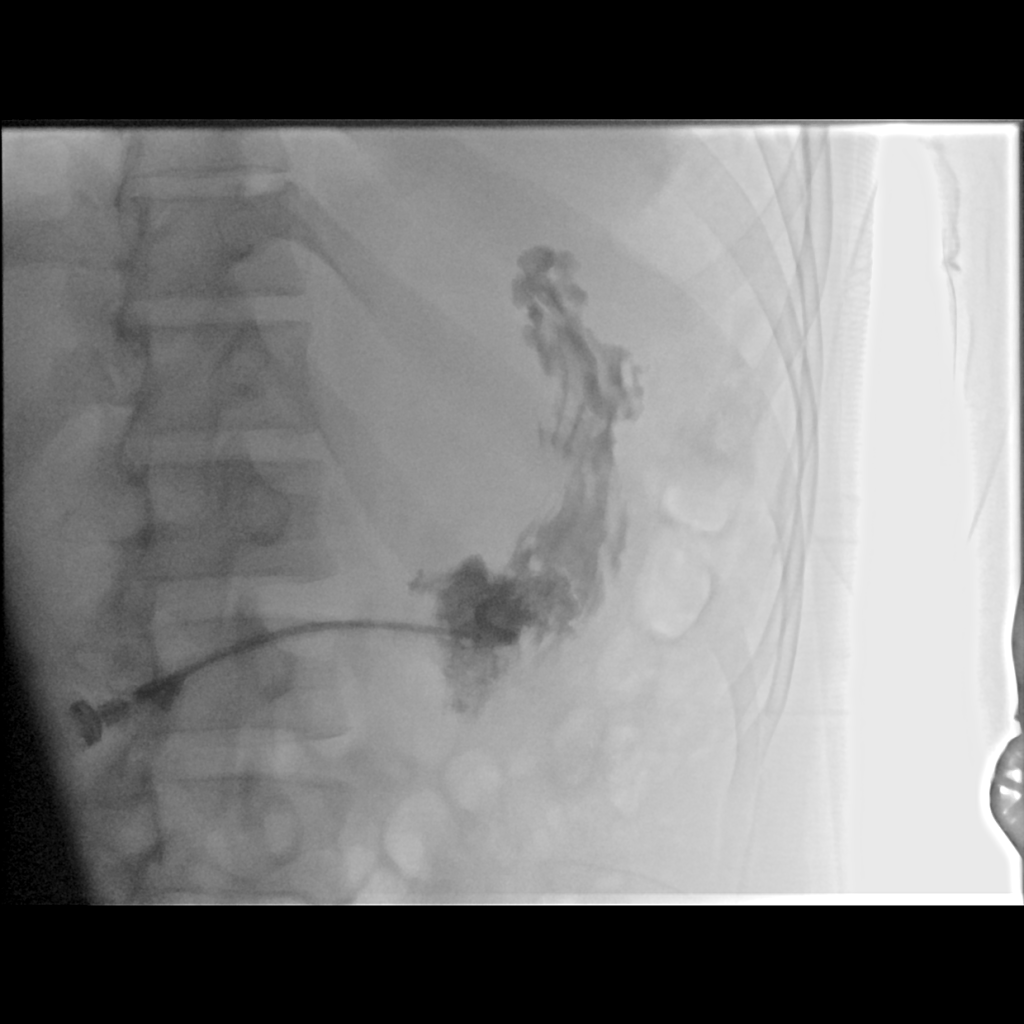
[im 2/2]
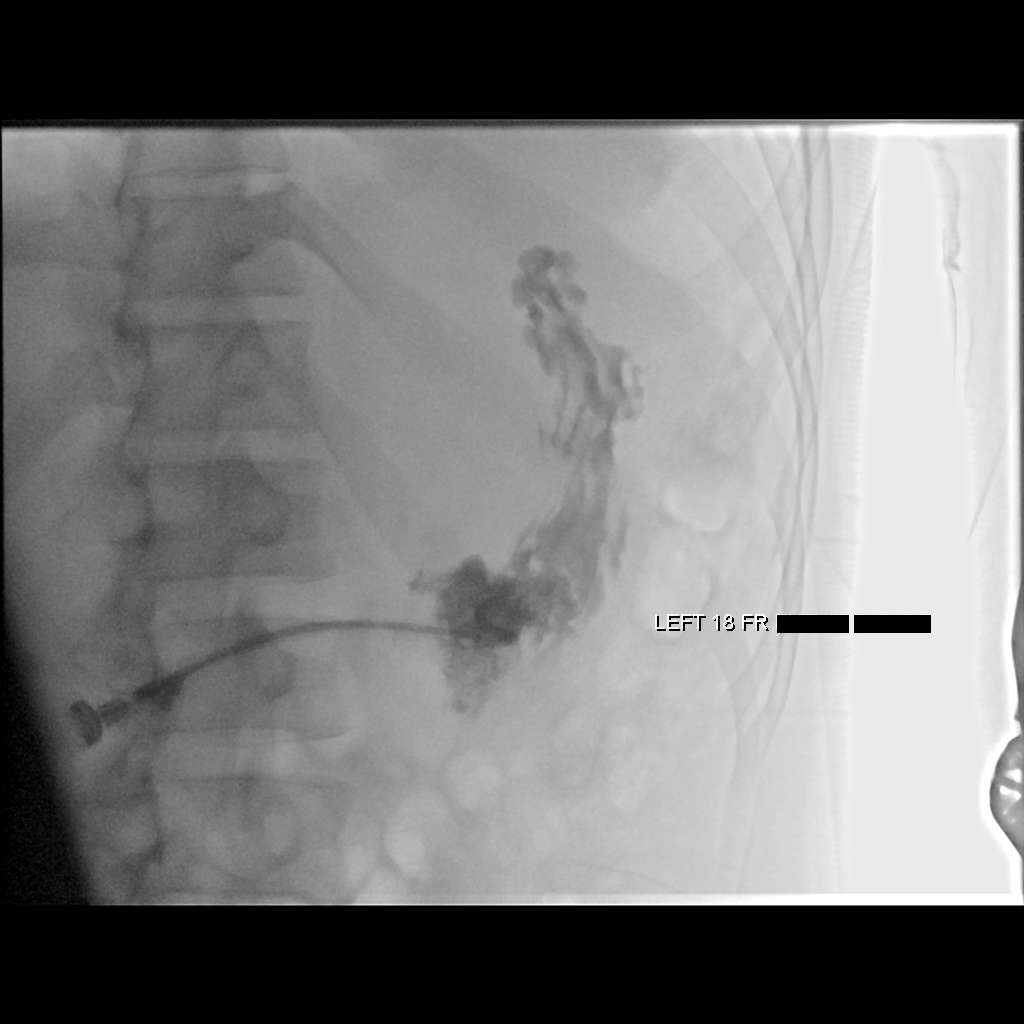

[2 of 2 positions shown; findings below may reference images not displayed]

EXAM:
FLUOROSCOPIC REPLACEMENT OF A 18 FRENCH BALLOON RETENTION
GASTROSTOMY

MEDICATIONS:
NONE.

ANESTHESIA/SEDATION:
Moderate Sedation Time:  NONE.

The patient was continuously monitored during the procedure by the
interventional radiology nurse under my direct supervision.

CONTRAST:  10 CC-administered into the gastric lumen.

FLUOROSCOPY TIME:  Fluoroscopy Time: 0 minutes 6 seconds (1 mGy).

COMPLICATIONS:
None immediate.

PROCEDURE:
Informed written consent was obtained from the PATIENT'S FAMILY
after a thorough discussion of the procedural risks, benefits and
alternatives. All questions were addressed. Maximal Sterile Barrier
Technique was utilized including caps, mask, sterile gowns, sterile
gloves, sterile drape, hand hygiene and skin antiseptic. A timeout
was performed prior to the initiation of the procedure.

existing foley catheter in the percutaneous tract was removed. a new
18 french balloon retention gastrostomy was advanced to the stomach.
there was return of gastric contents. Retention balloon inflated
with 7 cc fluid and retracted against the anterior gastric wall.
contrast injection confirms position of the stomach. catheter
secured externally and flushed. feeding tube ready for use.
IMPRESSION: Successful fluoroscopic replacement of an 18 French balloon
retention gastrostomy. Confirmed in the stomach. Ready for use.

## 2020-04-27 MED ORDER — THIAMINE HCL 100 MG PO TABS
100.0000 mg | ORAL_TABLET | Freq: Every day | ORAL | Status: DC
  Administered 2020-04-28 – 2020-05-04 (×7): 100 mg
  Filled 2020-04-27 (×8): qty 1

## 2020-04-27 MED ORDER — CHLORDIAZEPOXIDE HCL 25 MG PO CAPS
50.0000 mg | ORAL_CAPSULE | Freq: Three times a day (TID) | ORAL | Status: DC
  Administered 2020-04-27 – 2020-05-10 (×38): 50 mg
  Filled 2020-04-27 (×38): qty 2

## 2020-04-27 MED ORDER — ENOXAPARIN SODIUM 30 MG/0.3ML ~~LOC~~ SOLN: 30.0000 mg | Freq: Two times a day (BID) | SUBCUTANEOUS | Status: DC

## 2020-04-27 MED ORDER — RESOURCE THICKENUP CLEAR PO POWD
ORAL | Status: DC | PRN
  Filled 2020-04-27: qty 125

## 2020-04-27 MED ORDER — OSMOLITE 1.5 CAL PO LIQD
1000.0000 mL | ORAL | Status: DC
  Administered 2020-04-27: 1000 mL

## 2020-04-27 MED ORDER — CLONAZEPAM 0.5 MG PO TABS
1.0000 mg | ORAL_TABLET | Freq: Two times a day (BID) | ORAL | Status: AC
  Administered 2020-04-28 – 2020-04-30 (×4): 1 mg
  Filled 2020-04-27 (×4): qty 2

## 2020-04-27 MED ORDER — ACETAMINOPHEN 325 MG PO TABS
325.0000 mg | ORAL_TABLET | ORAL | Status: DC | PRN
  Administered 2020-04-28 – 2020-05-16 (×18): 650 mg
  Filled 2020-04-27 (×19): qty 2

## 2020-04-27 MED ORDER — CLONAZEPAM 0.5 MG PO TABS
0.5000 mg | ORAL_TABLET | Freq: Three times a day (TID) | ORAL | Status: AC
  Administered 2020-04-30 – 2020-05-02 (×6): 0.5 mg
  Filled 2020-04-27 (×6): qty 1

## 2020-04-27 MED ORDER — ADULT MULTIVITAMIN W/MINERALS CH
1.0000 | ORAL_TABLET | Freq: Every day | ORAL | Status: DC
  Administered 2020-04-28 – 2020-05-16 (×18): 1
  Filled 2020-04-27 (×18): qty 1

## 2020-04-27 MED ORDER — THIAMINE HCL 100 MG/ML IJ SOLN
100.0000 mg | Freq: Every day | INTRAMUSCULAR | Status: DC
  Administered 2020-05-05: 100 mg via INTRAVENOUS
  Filled 2020-04-27 (×8): qty 1

## 2020-04-27 MED ORDER — CLONIDINE HCL 0.1 MG PO TABS
0.1000 mg | ORAL_TABLET | Freq: Two times a day (BID) | ORAL | Status: AC
  Administered 2020-05-01 – 2020-05-03 (×4): 0.1 mg
  Filled 2020-04-27 (×4): qty 1

## 2020-04-27 MED ORDER — ENOXAPARIN SODIUM 30 MG/0.3ML ~~LOC~~ SOLN
30.0000 mg | Freq: Two times a day (BID) | SUBCUTANEOUS | Status: DC
Start: 2020-04-27 — End: 2020-06-02
  Administered 2020-04-27 – 2020-06-01 (×67): 30 mg via SUBCUTANEOUS
  Filled 2020-04-27 (×69): qty 0.3

## 2020-04-27 MED ORDER — LIDOCAINE VISCOUS HCL 2 % MT SOLN
OROMUCOSAL | Status: AC
  Filled 2020-04-27: qty 15

## 2020-04-27 MED ORDER — OXYCODONE HCL 5 MG/5ML PO SOLN
10.0000 mg | ORAL | Status: DC | PRN
  Administered 2020-04-27 – 2020-04-28 (×3): 15 mg
  Filled 2020-04-27 (×4): qty 15

## 2020-04-27 MED ORDER — IOHEXOL 300 MG/ML  SOLN
50.0000 mL | Freq: Once | INTRAMUSCULAR | Status: AC | PRN
  Administered 2020-04-27: 10 mL

## 2020-04-27 MED ORDER — PROSOURCE TF PO LIQD
90.0000 mL | Freq: Two times a day (BID) | ORAL | Status: DC
  Administered 2020-04-27: 90 mL
  Filled 2020-04-27: qty 90

## 2020-04-27 MED ORDER — ONDANSETRON HCL 4 MG/2ML IJ SOLN: 4.0000 mg | Freq: Four times a day (QID) | INTRAMUSCULAR | Status: DC | PRN

## 2020-04-27 MED ORDER — METOPROLOL TARTRATE 25 MG/10 ML ORAL SUSPENSION
50.0000 mg | Freq: Two times a day (BID) | ORAL | Status: DC
  Administered 2020-04-27: 50 mg
  Filled 2020-04-27: qty 20

## 2020-04-27 MED ORDER — VALPROIC ACID 250 MG/5ML PO SOLN
500.0000 mg | Freq: Three times a day (TID) | ORAL | Status: DC
  Administered 2020-04-27 – 2020-05-08 (×32): 500 mg
  Filled 2020-04-27 (×33): qty 10

## 2020-04-27 MED ORDER — CLONIDINE HCL 0.1 MG PO TABS
0.1000 mg | ORAL_TABLET | Freq: Every day | ORAL | Status: AC
  Administered 2020-05-04 – 2020-05-05 (×2): 0.1 mg
  Filled 2020-04-27 (×2): qty 1

## 2020-04-27 MED ORDER — IPRATROPIUM-ALBUTEROL 0.5-2.5 (3) MG/3ML IN SOLN: 3.0000 mL | Freq: Four times a day (QID) | RESPIRATORY_TRACT | Status: DC | PRN

## 2020-04-27 MED ORDER — QUETIAPINE FUMARATE 200 MG PO TABS
600.0000 mg | ORAL_TABLET | Freq: Every day | ORAL | Status: DC
  Administered 2020-04-27 – 2020-05-03 (×7): 600 mg
  Filled 2020-04-27 (×7): qty 3

## 2020-04-27 MED ORDER — METHOCARBAMOL 500 MG PO TABS
1000.0000 mg | ORAL_TABLET | Freq: Three times a day (TID) | ORAL | Status: DC
  Administered 2020-04-27 – 2020-05-12 (×44): 1000 mg
  Filled 2020-04-27 (×46): qty 2

## 2020-04-27 MED ORDER — BISACODYL 10 MG RE SUPP: 10.0000 mg | Freq: Every day | RECTAL | Status: DC | PRN

## 2020-04-27 MED ORDER — QUETIAPINE FUMARATE 200 MG PO TABS
200.0000 mg | ORAL_TABLET | Freq: Every morning | ORAL | Status: DC
  Administered 2020-04-28 – 2020-05-04 (×7): 200 mg
  Filled 2020-04-27 (×7): qty 1

## 2020-04-27 MED ORDER — CLONAZEPAM 0.5 MG PO TABS
0.5000 mg | ORAL_TABLET | Freq: Every day | ORAL | Status: AC
  Administered 2020-05-05 – 2020-05-06 (×2): 0.5 mg
  Filled 2020-04-27 (×2): qty 1

## 2020-04-27 MED ORDER — CLONIDINE HCL 0.1 MG PO TABS
0.1000 mg | ORAL_TABLET | Freq: Four times a day (QID) | ORAL | Status: AC
  Administered 2020-04-29 – 2020-05-01 (×8): 0.1 mg
  Filled 2020-04-27 (×8): qty 1

## 2020-04-27 MED ORDER — CLONAZEPAM 0.5 MG PO TABS
1.0000 mg | ORAL_TABLET | Freq: Three times a day (TID) | ORAL | Status: AC
  Administered 2020-04-27 – 2020-04-28 (×3): 1 mg
  Filled 2020-04-27 (×3): qty 2

## 2020-04-27 MED ORDER — METOPROLOL TARTRATE 25 MG/10 ML ORAL SUSPENSION
75.0000 mg | Freq: Two times a day (BID) | ORAL | Status: DC
  Administered 2020-04-27 – 2020-05-02 (×10): 75 mg
  Filled 2020-04-27 (×11): qty 30

## 2020-04-27 MED ORDER — METOPROLOL TARTRATE 25 MG/10 ML ORAL SUSPENSION
50.0000 mg | Freq: Two times a day (BID) | ORAL | Status: DC
  Filled 2020-04-27 (×2): qty 20

## 2020-04-27 MED ORDER — ONDANSETRON 4 MG PO TBDP
4.0000 mg | ORAL_TABLET | Freq: Four times a day (QID) | ORAL | Status: DC | PRN
  Filled 2020-04-27: qty 1

## 2020-04-27 MED ORDER — CLONAZEPAM 0.5 MG PO TABS
0.5000 mg | ORAL_TABLET | Freq: Two times a day (BID) | ORAL | Status: AC
  Administered 2020-05-02 – 2020-05-04 (×4): 0.5 mg
  Filled 2020-04-27 (×4): qty 1

## 2020-04-27 MED ORDER — FOLIC ACID 1 MG PO TABS
1.0000 mg | ORAL_TABLET | Freq: Every day | ORAL | Status: DC
  Administered 2020-04-28 – 2020-05-14 (×17): 1 mg
  Filled 2020-04-27 (×17): qty 1

## 2020-04-27 MED ORDER — CLONIDINE HCL 0.1 MG PO TABS
0.2000 mg | ORAL_TABLET | Freq: Four times a day (QID) | ORAL | Status: AC
  Administered 2020-04-27 – 2020-04-29 (×7): 0.2 mg
  Filled 2020-04-27 (×7): qty 2

## 2020-04-27 NOTE — Progress Notes (Signed)
Inpatient Rehabilitation Medication Review by a Pharmacist  A complete drug regimen review was completed for this patient to identify any potential clinically significant medication issues.  Clinically significant medication issues were identified:  Yes   Type of Medication Issue Identified Description of Issue Urgent (address now) Non-Urgent (address on AM team rounds) Plan Plan Accepted by Provider? (Yes / No / Pending AM Rounds)  Drug Interaction(s) (clinically significant)       Duplicate Therapy       Allergy       No Medication Administration End Date       Incorrect Dose       Additional Drug Therapy Needed       Other  CIR H&P included plan to increase metoprolol dose to 75 mg PO BID for tachycardia; pt still has order for metoprolol 50 mg PO BID Non urgent Pending review by CIR team on AM rounds Pending AM rounds    Name of provider notified for urgent issues identified:  N/A  For non-urgent medication issues to be resolved on team rounds tomorrow morning a [facility] Secure Chat Handoff was sent to:  N/A (CIR admission med review was completed after 5 PM)  Time spent performing this drug regimen review (minutes):  20  Vicki Mallet, PharmD, BCPS, [facility] Clinical Pharmacist 04/27/2020 5:43 PM

## 2020-04-27 NOTE — Progress Notes (Signed)
Inpatient Rehab Admissions Coordinator:   I have a bed available for pt to admit to CIR today.  Dr. Janee Morn and Duwayne Heck, PA-C, in agreement. Will let pt/family and TOC team know.   Estill Dooms, PT, DPT Admissions Coordinator [phone] 04/27/20  9:51 AM

## 2020-04-27 NOTE — Progress Notes (Signed)
Orthopedic Tech Progress Note Patient Details:  Jeremy George Aug 20, 1995 [id number] SECRETARY called requesting an ABDOMINAL BINDER. Dropped off to room Ortho Devices Type of Ortho Device: Abdominal binder Ortho Device/Splint Location: STOMACH Ortho Device/Splint Interventions: Ordered,Other (comment)   Post Interventions Patient Tolerated: Well Instructions Provided: Care of device   [name] 04/27/2020, 5:43 PM
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
## 2020-04-27 NOTE — TOC Transition Note (Signed)
Transition of Care [facility]) - CM/SW Discharge Note   Patient Details  Name: Jeremy George MRN: [id number] Date of Birth: 1995/10/29  Transition [facility] Care [facility] CM/SW Contact:  Glennon Mac, RN Phone Number: 04/27/2020, 12:28 PM   Clinical Narrative:   Pt has been accepted for admission to [facility] today.  Plan dc to CIR when bed availability.     Final next level of care: IP Rehab Facility Barriers to Discharge: Barriers Resolved   Patient Goals and CMS Choice   CMS Medicare.gov Compare Post Acute Care list provided to:: Patient Represenative (must comment) (Mother) Choice offered to / list presented to : Parent                        Discharge Plan and Services   Discharge Planning Services: CM Consult Post Acute Care Choice: IP Rehab                               Social Determinants of Health (SDOH) Interventions     Readmission Risk Interventions No flowsheet data found.  Quintella Baton, RN, BSN  Trauma/Neuro ICU Case Manager [phone]

## 2020-04-27 NOTE — H&P (Addendum)
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 ventilatory dependent respiratory failure.  Tracheostomy 04/06/2020.  Patient self extubated 04/24/2020. 14.  Dysphagia.  Gastrostomy tube 04/06/2020 by Dr.Lovick.  Dysphagia #1 nectar thick liquid diet has been initiated.  Patient did pull out his gastrostomy tube.  Continue abdominal binder 04/24/2020 replaced by interventional radiology 04/27/2020. 15.  Nondisplaced left L5 transverse process fracture.  Conservative care.  No brace indicated. 16.  History of alcohol use.  Alcohol level 184 on admission.  Monitor for withdrawal.  Provide counseling 17.  Tachycardia.  Lopressor 50 mg twice daily.  Monitor with increased mobility. Tachycardic, increase Lopressor to 75mg  BID 18.  Leukocytosis.  Follow-up CBC.  Urine study, chest x-ray and venous Doppler studies negative. 19. Elevated CBGs:  educated from mom that this is likely from tube feeds.   I have personally performed a face to face diagnostic evaluation, including, but not limited to relevant history and physical exam findings, of this patient and developed relevant assessment and plan.  Additionally, I have reviewed and concur with the physician assistant's documentation above.  , MD  Sula Soda Angiulli, PA-C 04/27/2020
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
## 2020-04-27 NOTE — Procedures (Signed)
Interventional Radiology Procedure Note  Procedure: FLUORO 18 FR GTUBE REPLACED    Complications: None  Estimated Blood Loss:  0  Findings: READY FOR USE    Sharen Counter, MD

## 2020-04-28 ENCOUNTER — Encounter (HOSPITAL_COMMUNITY): Payer: Self-pay

## 2020-04-28 DIAGNOSIS — E43 Unspecified severe protein-calorie malnutrition: Secondary | ICD-10-CM | POA: Insufficient documentation

## 2020-04-28 DIAGNOSIS — S069X9A Unspecified intracranial injury with loss of consciousness of unspecified duration, initial encounter: Secondary | ICD-10-CM

## 2020-04-28 LAB — COMPREHENSIVE METABOLIC PANEL
ALT: 29 U/L (ref 0–44)
AST: 24 U/L (ref 15–41)
Albumin: 3.4 g/dL — ABNORMAL LOW (ref 3.5–5.0)
Alkaline Phosphatase: 247 U/L — ABNORMAL HIGH (ref 38–126)
Anion gap: 16 — ABNORMAL HIGH (ref 5–15)
BUN: 27 mg/dL — ABNORMAL HIGH (ref 6–20)
CO2: 23 mmol/L (ref 22–32)
Calcium: 10.7 mg/dL — ABNORMAL HIGH (ref 8.9–10.3)
Chloride: 105 mmol/L (ref 98–111)
Creatinine, Ser: 0.84 mg/dL (ref 0.61–1.24)
GFR, Estimated: >60 mL/min (ref >60)
Glucose, Bld: 136 mg/dL — ABNORMAL HIGH (ref 70–99)
Potassium: 3.9 mmol/L (ref 3.5–5.1)
Sodium: 144 mmol/L (ref 135–145)
Total Bilirubin: 0.5 mg/dL (ref 0.3–1.2)
Total Protein: 9.3 g/dL — ABNORMAL HIGH (ref 6.5–8.1)

## 2020-04-28 LAB — CBC WITH DIFFERENTIAL/PLATELET
Abs Immature Granulocytes: 0.1 10*3/uL — ABNORMAL HIGH (ref 0.00–0.07)
Basophils Absolute: 0.1 10*3/uL (ref 0.0–0.1)
Basophils Relative: 1 %
Eosinophils Absolute: 0.6 10*3/uL — ABNORMAL HIGH (ref 0.0–0.5)
Eosinophils Relative: 3 %
HCT: 39.4 % (ref 39.0–52.0)
Hemoglobin: 12.3 g/dL — ABNORMAL LOW (ref 13.0–17.0)
Immature Granulocytes: 1 %
Lymphocytes Relative: 19 %
Lymphs Abs: 3.3 10*3/uL (ref 0.7–4.0)
MCH: 27.2 pg (ref 26.0–34.0)
MCHC: 31.2 g/dL (ref 30.0–36.0)
MCV: 87 fL (ref 80.0–100.0)
Monocytes Absolute: 1.6 10*3/uL — ABNORMAL HIGH (ref 0.1–1.0)
Monocytes Relative: 10 %
Neutro Abs: 11.2 10*3/uL — ABNORMAL HIGH (ref 1.7–7.7)
Neutrophils Relative %: 66 %
Platelets: 562 10*3/uL — ABNORMAL HIGH (ref 150–400)
RBC: 4.53 MIL/uL (ref 4.22–5.81)
RDW: 14.4 % (ref 11.5–15.5)
WBC: 16.9 10*3/uL — ABNORMAL HIGH (ref 4.0–10.5)
nRBC: 0 % (ref 0.0–0.2)

## 2020-04-28 MED ORDER — FREE WATER
100.0000 mL | Freq: Four times a day (QID) | Status: DC
  Administered 2020-04-28 – 2020-05-01 (×11): 100 mL

## 2020-04-28 MED ORDER — OXYCODONE HCL 5 MG/5ML PO SOLN
10.0000 mg | ORAL | Status: DC | PRN
Start: 2020-04-28 — End: 2020-04-29
  Administered 2020-04-29: 10 mg
  Filled 2020-04-28: qty 10

## 2020-04-28 MED ORDER — OSMOLITE 1.5 CAL PO LIQD
1000.0000 mL | ORAL | Status: DC
  Administered 2020-04-28: 1000 mL

## 2020-04-28 MED ORDER — PROSOURCE TF PO LIQD
90.0000 mL | Freq: Two times a day (BID) | ORAL | Status: DC
  Administered 2020-04-28 – 2020-05-16 (×36): 90 mL
  Filled 2020-04-28 (×39): qty 90

## 2020-04-28 MED ORDER — OSMOLITE 1.5 CAL PO LIQD
1000.0000 mL | ORAL | Status: DC
  Administered 2020-04-28 – 2020-05-02 (×4): 1000 mL
  Filled 2020-04-28 (×3): qty 1000

## 2020-04-28 MED ORDER — PROPRANOLOL HCL 10 MG PO TABS
10.0000 mg | ORAL_TABLET | Freq: Every day | ORAL | Status: DC
  Administered 2020-04-28 – 2020-04-30 (×3): 10 mg via ORAL
  Filled 2020-04-28 (×3): qty 1
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
## 2020-04-28 NOTE — Plan of Care (Signed)
Problem: RH Balance Goal: LTG: Patient will maintain dynamic sitting balance (OT) Description: LTG:  Patient will maintain dynamic sitting balance with assistance during activities of daily living (OT) Flowsheets (Taken 04/28/2020 1432) LTG: Pt will maintain dynamic sitting balance during ADLs with: Contact Guard/Touching assist   Problem: Sit to Stand Goal: LTG:  Patient will perform sit to stand in prep for activites of daily living with assistance level (OT) Description: LTG:  Patient will perform sit to stand in prep for activites of daily living with assistance level (OT) Flowsheets (Taken 04/28/2020 1432) LTG: PT will perform sit to stand in prep for activites of daily living with assistance level: Minimal Assistance - Patient > 75%   Problem: RH Eating Goal: LTG Patient will perform eating w/assist, cues/equip (OT) Description: LTG: Patient will perform eating with assist, with/without cues using equipment (OT) Flowsheets (Taken 04/28/2020 1432) LTG: Pt will perform eating with assistance level of: Set up assist    Problem: RH Grooming Goal: LTG Patient will perform grooming w/assist,cues/equip (OT) Description: LTG: Patient will perform grooming with assist, with/without cues using equipment (OT) Flowsheets (Taken 04/28/2020 1432) LTG: Pt will perform grooming with assistance level of: Supervision/Verbal cueing   Problem: RH Bathing Goal: LTG Patient will bathe all body parts with assist levels (OT) Description: LTG: Patient will bathe all body parts with assist levels (OT) Flowsheets (Taken 04/28/2020 1432) LTG: Pt will perform bathing with assistance level/cueing: Minimal Assistance - Patient > 75% LTG: Position pt will perform bathing: Shower   Problem: RH Dressing Goal: LTG Patient will perform upper body dressing (OT) Description: LTG Patient will perform upper body dressing with assist, with/without cues (OT). Flowsheets (Taken 04/28/2020 1432) LTG: Pt will perform upper body  dressing with assistance level of: Minimal Assistance - Patient > 75% Goal: LTG Patient will perform lower body dressing w/assist (OT) Description: LTG: Patient will perform lower body dressing with assist, with/without cues in positioning using equipment (OT) Flowsheets (Taken 04/28/2020 1432) LTG: Pt will perform lower body dressing with assistance level of: Minimal Assistance - Patient > 75%   Problem: RH Toileting Goal: LTG Patient will perform toileting task (3/3 steps) with assistance level (OT) Description: LTG: Patient will perform toileting task (3/3 steps) with assistance level (OT)  Flowsheets (Taken 04/28/2020 1432) LTG: Pt will perform toileting task (3/3 steps) with assistance level: Minimal Assistance - Patient > 75%   Problem: RH Toilet Transfers Goal: LTG Patient will perform toilet transfers w/assist (OT) Description: LTG: Patient will perform toilet transfers with assist, with/without cues using equipment (OT) Flowsheets (Taken 04/28/2020 1432) LTG: Pt will perform toilet transfers with assistance level of: Minimal Assistance - Patient > 75%   Problem: RH Tub/Shower Transfers Goal: LTG Patient will perform tub/shower transfers w/assist (OT) Description: LTG: Patient will perform tub/shower transfers with assist, with/without cues using equipment (OT) Flowsheets (Taken 04/28/2020 1432) LTG: Pt will perform tub/shower stall transfers with assistance level of: Minimal Assistance - Patient > 75%   Problem: RH Attention Goal: LTG Patient will demonstrate this level of attention during functional activites (OT) Description: LTG:  Patient will demonstrate this level of attention during functional activites  (OT) Flowsheets (Taken 04/28/2020 1432) Patient will demonstrate this level of attention during functional activites: Sustained Patient will demonstrate above attention level in the following environment: Controlled LTG: Patient will demonstrate this level of attention during  functional activites (OT): Supervision   Problem: RH Awareness Goal: LTG: Patient will demonstrate awareness during functional activites type of (OT) Description: LTG: Patient  will demonstrate awareness during functional activites type of (OT) Flowsheets (Taken 04/28/2020 1432) Patient will demonstrate awareness during functional activites type of: Anticipatory LTG: Patient will demonstrate awareness during functional activites type of (OT): Minimal Assistance - Patient > 75%

## 2020-04-28 NOTE — Progress Notes (Signed)
Patient ID: Jeremy Palms., male   DOB: 01-22-1996, 25 y.o.   MRN: [id number]  SW met with pt and pt mother in room. Pt sleeping during time of visit. SW introduced self, explained role, discussed ELOS 4-5 weeks based on team conference discussion, and completed assessment with mother. SW briefly discussed some challenges associated with pt being uninsured such as DME and TF. She reported pt has access to DME: hospital bed, w/c, RW, and bariatric BSC. SW discussed [place] medication assistant program, and community resources for medical care at discharge.   *See assessment for further details.    Jeremy George, MSW, [place] Office: [phone] Cell: [phone] Fax: [phone]

## 2020-04-28 NOTE — Progress Notes (Signed)
Orthopedic Tech Progress Note Patient Details:  Jeremy George. May 26, 1995 [id number]  Ortho Devices Type of Ortho Device: Abduction pillow Ortho Device/Splint Location: HIPS Ortho Device/Splint Interventions: Ordered,Application,Adjustment   Post Interventions Patient Tolerated: Well Instructions Provided: Care of device   [name] 04/28/2020, 5:09 PM

## 2020-04-28 NOTE — Progress Notes (Signed)
   04/27/20 1755  Assess: MEWS Score  Temp (!) 97.5 F (36.4 C)  BP (!) 143/105  Pulse Rate (!) 133  Resp 17  SpO2 99 %  O2 Device Room Air  Assess: MEWS Score  MEWS Temp 0  MEWS Systolic 0  MEWS Pulse 3  MEWS RR 0  MEWS LOC 0  MEWS Score 3  MEWS Score Color Yellow  Assess: if the MEWS score is Yellow or Red  Were vital signs taken at a resting state? Yes  Focused Assessment No change from prior assessment  Early Detection of Sepsis Score *See Row Information* Low  MEWS guidelines implemented *See Row Information* Yes  Treat  MEWS Interventions Administered scheduled meds/treatments  Pain Scale 0-10  Pain Score 0  Take Vital Signs  Increase Vital Sign Frequency  Yellow: Q 2hr X 2 then Q 4hr X 2, if remains yellow, continue Q 4hrs  Escalate  MEWS: Escalate Yellow: discuss with charge nurse/RN and consider discussing with provider and RRT  Notify: Charge Nurse/RN  Name of Charge Nurse/RN Notified Tylia Ewell  Date Charge Nurse/RN Notified 04/27/20  Time Charge Nurse/RN Notified 1810  Notify: Provider  Provider Name/Title Mariam Dollar  Date Provider Notified 04/27/20  Time Provider Notified 1815  Notification Type Call  Notification Reason Other (Comment) (MEWS)  Provider response No new orders  Date of Provider Response 04/27/20  Time of Provider Response 1815  Document  Patient Outcome Stabilized after interventions
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
## 2020-04-28 NOTE — Progress Notes (Signed)
Inpatient Rehabilitation  Patient information reviewed and entered into eRehab system by Melissa M. Bowie, M.A., CCC/SLP, PPS Coordinator.  Information including medical coding, functional ability and quality indicators will be reviewed and updated through discharge.    
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
## 2020-04-28 NOTE — Progress Notes (Signed)
°   04/27/20 1755  Assess: MEWS Score  Temp (!) 97.5 F (36.4 C)  BP (!) 143/105  Pulse Rate (!) 133  Resp 17  SpO2 99 %  O2 Device Room Air  Assess: MEWS Score  MEWS Temp 0  MEWS Systolic 0  MEWS Pulse 3  MEWS RR 0  MEWS LOC 0  MEWS Score 3  MEWS Score Color Yellow  Assess: if the MEWS score is Yellow or Red  Were vital signs taken at a resting state? Yes  Focused Assessment No change from prior assessment  Early Detection of Sepsis Score *See Row Information* Low  MEWS guidelines implemented *See Row Information* Yes  Treat  MEWS Interventions Administered scheduled meds/treatments  Pain Scale 0-10  Pain Score 0  Take Vital Signs  Increase Vital Sign Frequency  Yellow: Q 2hr X 2 then Q 4hr X 2, if remains yellow, continue Q 4hrs  Escalate  MEWS: Escalate Yellow: discuss with charge nurse/RN and consider discussing with provider and RRT  Notify: Charge Nurse/RN  Name of Charge Nurse/RN Notified Shae Hinnenkamp  Date Charge Nurse/RN Notified 04/27/20  Time Charge Nurse/RN Notified 1810  Notify: Provider  Provider Name/Title Mariam Dollar  Date Provider Notified 04/27/20  Time Provider Notified 1815  Notification Type Call  Notification Reason Other (Comment) (MEWS)  Provider response No new orders  Date of Provider Response 04/27/20  Time of Provider Response 1815  Document  Patient Outcome Stabilized after interventions

## 2020-04-28 NOTE — Patient Care Conference (Signed)
Inpatient RehabilitationTeam Conference and Plan of Care Update Date: 04/28/2020   Time: 10:14 AM    Patient Name: Jeremy George.      Medical Record Number: [id number]  Date of Birth: February 05, 1996 Sex: Male         Room/Bed: 4W16C/4W16C-01 Payor Info: Payor: /    Admit Date/Time:  04/27/2020  4:59 PM  Primary Diagnosis:  TBI (traumatic brain injury) [facility])  Hospital Problems: Principal Problem:   TBI (traumatic brain injury) [facility])    Expected Discharge Date: Expected Discharge Date:  (4-5 weeks)  Team Members Present: Physician leading conference: Dr. Sula Soda Care Coodinator Present: Cecile Sheerer, LCSWA;Stacey Marlyne Beards, RN, BSN, CRRN Nurse Present: Other (comment) Phillip Heal, RN) PT Present: Serina Cowper, PT OT Present: Jake Shark, OT SLP Present: Feliberto Gottron, SLP PPS Coordinator present : Fae Pippin, SLP     Current Status/Progress Goal Weekly Team Focus  Bowel/Bladder   incontinent B/B  to be continent B/B  timed toileting q 2 hr   Swallow/Nutrition/ Hydration   Eval Pending         ADL's   Very lethargic, difficult to arouse, RLAS V, max-total A  min A- CGA overall  Attention, initiation of ADLs, bed mobility   Mobility   Eval pending.         Communication   Eval Pending         Safety/Cognition/ Behavioral Observations  Eval Pending         Pain   no complaints of pain  <3 on a 0-10 pain scale  assess pain q 4 hr and prn   Skin   multiple abrasion sites, multiple incision sites  no new breakdown while on rehab  assess q shift and prn     Discharge Planning:  To be assessed. Pt is Uninsured. Per EMR, pt to discharge to home with his mother who will provide 24/7 care. Assistance from girlfriend as well.   Team Discussion: Patient has PEG tube for nutrition. Patient is non-verbal at this point, incontinent B/B. Waiting on Nutrition to determine if patient will be continuous tube feed or bolus. Patient is uninsured, to be  discharged home to mom's with the assistance of girlfriend.  Patient on target to meet rehab goals: Left eye appears to be deviated to the left. Difficult to arouse. Min assist to contact guard goals for OT. PT and SLP evals pending.  *See Care Plan and progress notes for long and short-term goals.   Revisions to Treatment Plan:  Not at this time.  Teaching Needs: Family education, medication management, skin/wound care, pain management, transfer training, gait training, balance training, endurance training, dietary management, bowel/bladder management, sustained attention management.  Current Barriers to Discharge: Inaccessible home environment, Decreased caregiver support, Home enviroment access/layout, Incontinence, Wound care, Lack of/limited family support, Weight bearing restrictions, Medication compliance, Behavior and Nutritional means  Possible Resolutions to Barriers: Continue current medications, provide emotional support.     Medical Summary               I attest that I was present, lead the team conference, and concur with the assessment and plan of the team.   Tennis Must 04/28/2020, 2:44 PM
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
## 2020-04-28 NOTE — Progress Notes (Signed)
Pt found at the end of bed after alarm triggered. Pt was assisted back to bed and personal care was performed. 3 SR and footboard in place. Will monitor behavior to see if tele-sitter would be appropriate.
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
## 2020-04-29 LAB — GLUCOSE, CAPILLARY: Glucose-Capillary: 120 mg/dL — ABNORMAL HIGH (ref 70–99)

## 2020-04-29 MED ORDER — OXYCODONE HCL 5 MG/5ML PO SOLN
5.0000 mg | ORAL | Status: DC | PRN
Start: 2020-04-29 — End: 2020-05-04
  Administered 2020-04-30 – 2020-05-04 (×13): 5 mg
  Filled 2020-04-29 (×14): qty 5

## 2020-04-29 MED ORDER — LIDOCAINE 5 % EX PTCH
1.0000 | MEDICATED_PATCH | CUTANEOUS | Status: DC
  Administered 2020-04-29 – 2020-04-30 (×2): 1 via TRANSDERMAL
  Filled 2020-04-29 (×2): qty 1

## 2020-04-29 NOTE — Progress Notes (Signed)
Occupational Therapy Session Note  Patient Details  Name: Jeremy George. MRN: [id number] Date of Birth: 28-Sep-1995  Today's Date: 04/29/2020 OT Individual Time: 4098-1191 OT Individual Time Calculation (min): 60 min    Session 2: OT Individual Time: 4782-9562 OT Individual Time Calculation (min): 45 min    Short Term Goals: Week 1:  OT Short Term Goal 1 (Week 1): Pt will sit EOB with max A during ADLs OT Short Term Goal 2 (Week 1): Pt will attend to oral care task with no more than mod cueing OT Short Term Goal 3 (Week 1): Pt will wash UB with max A  Skilled Therapeutic Interventions/Progress Updates:    Session 1: Pt received supine with his mother present. Pt very lethargic throughout session, intermittently perking up with eyes open. Adjusted environment to maximize arousal. Pt completed face washing with total HOH assist- less participatory than yesterday's session. Total A to wash under arms. Provided PROM to the L elbow- unable to come past 90 degrees. Brief changed with total A- pt incontinent of urine. Total A +2 to come EOB. Pt perked up a bit and then fell back asleep. Total A +2 lateral scoot transfer to the TIS w/c. Once in the chair, pt became more alert and was able to engage in oral care with max A and have a conversation with his mom. During ADLs, extra time provided for pt problem solving, sequencing, and motor planning. He was left sitting up in the w/c with his mother present, seatbelt on.    Session 2: Pt in TIS w/c upon arrival, mother present. Pt more alert and on facetime with his dad. He was having a 90% intelligible conversation with eyes open. Pt was taken to the therapy gym via TIS w/c. BITS system activity used to challenge trunk control (with forward weight shift), functional reaching, and visual tracking. Pt with obvious depth perception deficits with reaching. He switched between R and L UE reaching despite cueing to reduce AROM of the LUE. Pt required  mod-max facilitation for bringing his trunk forward and achieving full extension in the RUE. Pt reported need to urinate. In the room, he was incontinent/continent with mod A for urinal placement. Pt returned to his bed via total A +2 slideboard transfer. Extra time required to maintain posterior hip precautions. Pt left supine with hip abduction pillow placed. Posey belt on and bed alarm set.    Therapy Documentation Precautions:  Precautions Precautions: Fall,Posterior Hip Precaution Booklet Issued: No Precaution Comments: PEG, posterior hip precautions Restrictions Weight Bearing Restrictions: Yes RUE Weight Bearing: Weight bearing as tolerated LUE Weight Bearing: Non weight bearing LLE Weight Bearing: Touchdown weight bearing   Therapy/Group: Individual Therapy  Crissie Reese 04/29/2020, 6:36 AM

## 2020-04-29 NOTE — Progress Notes (Addendum)
Patient ID: [name]., male   DOB: 06/21/1995, 25 y.o.   MRN: [id number]  SW faxed short term disability forms to [employer] [place] [phone]; emailed forms to: [email or web address].   SW provided pt mother with original forms.   *SW received email confirmation that disability claim form was received, and under review. SW provided pt mother with email informing on above. SW also provided community resources for medical care.   Cecile Sheerer, MSW, LCSWA Office: [phone] Cell: [phone] Fax: [phone]

## 2020-04-29 NOTE — Progress Notes (Signed)
Physical Therapy Session Note  Patient Details  Name: Jeremy George. MRN: [id number] Date of Birth: [birth year]  Today's Date: 04/29/2020 PT Individual Time: 1410-1455 PT Individual Time Calculation (min): 45 min  Short Term Goals: Week 1:  PT Short Term Goal 1 (Week 1): Patient will perform rolling L with mod A. PT Short Term Goal 2 (Week 1): Patient will perform supine>sit using log roll technique to the L with max A. PT Short Term Goal 3 (Week 1): Patient will perform basic transfers with max A +2. PT Short Term Goal 4 (Week 1): Patient will tolerate sitting OOB >30 min 2/7 days.  Skilled Therapeutic Interventions/Progress Updates:     Patient in bed asleep and slide down with feet hanging off with foot of bed removed, Posey waist belt with strap unfastened and alarm off, and abductor pillow beside the bed upon PT arrival. RN and NT made aware and assisted with scooting patient up in the bed for improved positioning. Patient unresponsive to verbal and tactile stimulation, mild response to sternal rub with R upper extremity reaching toward therapist's hand and soft moaning x2, patient breathing evenly throughout attempts at arousal. Per RN, patient just received his afternoon medications and maintained sitting in TIS w/c a few hours this morning.   Donned pants dependently bed level then performed supine to sit dependently with +2 assist for improved arousal. Facilitated log roll to R with pillow between his knee to pull pants over his hips then come to sitting. Elevated bed to reduce hip flexion angle and improve upright posture and tolerance. Patient remained asleep in sitting, unresponsive to verbal cues, more responsive to sternal rub, grabbing therapists hand to pull it away from his chest and verbalizing x2, however speech unintelligible. Patient maintained sitting balance >8 min progressing from dependent assist to mod A while maintaining his eyes closed. Patient's mother arrived  with patient sitting up and patient with improved arousal, intermittently opening his eyes and verbalizing 1-2 word phrases with some improvement in enunciation. Patient eventually stated he wanted to lay down. Returned patient to lying with max A of 1 person reversing log roll technique and cuing patient to prop on his R elbow to lower his trunk down with hand-over-hand assist for placement. Performed scooting up in the bed with max A of 1 person having patient push through his R foot with the bed in trendelenburg position.   Doffed pants dependently, positioned and adjusted straps on abductor pillow between patient's legs, applied posey waist belt and set the alarm, tested alarm x1 with successful activation, and provided education to patient's mother about calling for assist to reposition patient rather than removing the foot board and for positioning the abductor pillow to maintain posterior hip precautions.   Patient in bed alert with his mother at bedside at end of session with breaks locked, bed and waist belt alarm set, and all needs within reach. Patient noted to be restless with improved arousal, reduced lighting and turned off the TV to reduce stimulation and manage restlessness. Educated patient's mother on management of lethargy and restlessness with environmental changes, to which she was receptive and stated understanding.    Therapy Documentation Precautions:  Precautions Precautions: Fall,Posterior Hip Precaution Booklet Issued: No Precaution Comments: PEG, posterior hip precautions Restrictions Weight Bearing Restrictions: Yes RUE Weight Bearing: Weight bearing as tolerated LUE Weight Bearing: Non weight bearing LLE Weight Bearing: Touchdown weight bearing   Therapy/Group: Individual Therapy  Jeremy George L Keyatta Tolles PT, DPT  04/29/2020, 17:20  PM  
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
## 2020-04-29 NOTE — Progress Notes (Signed)
Inpatient Rehabilitation Center Individual Statement of Services  Patient Name:  Jeremy George.  Date:  04/29/2020  Welcome to the Inpatient Rehabilitation Center.  Our goal is to provide you with an individualized program based on your diagnosis and situation, designed to meet your specific needs.  With this comprehensive rehabilitation program, you will be expected to participate in at least 3 hours of rehabilitation therapies Monday-Friday, with modified therapy programming on the weekends.  Your rehabilitation program will include the following services:  Physical Therapy (PT), Occupational Therapy (OT), Speech Therapy (ST), 24 hour per day rehabilitation nursing, Therapeutic Recreaction (TR), Psychology, Neuropsychology, Care Coordinator, Rehabilitation Medicine, Nutrition Services, Pharmacy Services and Other  Weekly team conferences will be held on Tuesdays to discuss your progress.  Your Inpatient Rehabilitation Care Coordinator will talk with you frequently to get your input and to update you on team discussions.  Team conferences with you and your family in attendance may also be held.  Expected length of stay: 4-5 weeks   Overall anticipated outcome: Minimal Assistance  Depending on your progress and recovery, your program may change. Your Inpatient Rehabilitation Care Coordinator will coordinate services and will keep you informed of any changes. Your Inpatient Rehabilitation Care Coordinator's name and contact numbers are listed  below.  The following services may also be recommended but are not provided by the Inpatient Rehabilitation Center:   Driving Evaluations  Home Health Rehabiltiation Services  Outpatient Rehabilitation Services  Vocational Rehabilitation   Arrangements will be made to provide these services after discharge if needed.  Arrangements include referral to agencies that provide these services.  Your insurance has been verified to be:   Uninsured  Your primary doctor is:  No PCP  Pertinent information will be shared with your doctor and your insurance company.  Inpatient Rehabilitation Care Coordinator:  Susie Cassette [phone] or ([phone]  Information discussed with and copy given to patient by: Gretchen Short, 04/29/2020, 10:56 AM

## 2020-04-29 NOTE — Progress Notes (Signed)
Inpatient Rehabilitation Care Coordinator Assessment and Plan Patient Details  Name: Jeremy George. MRN: [id number] Date of Birth: 09-May-1995  Today's Date: 04/29/2020  Hospital Problems: Principal Problem:   TBI (traumatic brain injury) [facility]) Active Problems:   Protein-calorie malnutrition, severe  Past Medical History: History reviewed. No pertinent past medical history. Past Surgical History:  Past Surgical History:  Procedure Laterality Date  . APPLICATION OF WOUND VAC Left 03/29/2020   Procedure: APPLICATION OF WOUND VAC LEFT ELBOW;  Surgeon: Shona Needles, MD;  Location: [place];  Service: Orthopedics;  Laterality: Left;  . ESOPHAGOGASTRODUODENOSCOPY N/A 04/06/2020   Procedure: ESOPHAGOGASTRODUODENOSCOPY (EGD);  Surgeon: Jesusita Oka, MD;  Location: [facility] ENDOSCOPY;  Service: General;  Laterality: N/A;  . EXTERNAL FIXATION LEG Left 03/29/2020   Procedure: EXTERNAL FIXATION ELBOW;  Surgeon: Shona Needles, MD;  Location: [place];  Service: Orthopedics;  Laterality: Left;  . HIP CLOSED REDUCTION Left 03/29/2020   Procedure: CLOSED REDUCTION HIP;  Surgeon: Shona Needles, MD;  Location: [place];  Service: Orthopedics;  Laterality: Left;  . I & D EXTREMITY Left 03/29/2020   Procedure: IRRIGATION AND DEBRIDEMENT LEFT ELBOW;  Surgeon: Shona Needles, MD;  Location: [place];  Service: Orthopedics;  Laterality: Left;  . IR REPLC GASTRO/COLONIC TUBE PERCUT W/FLUORO  04/27/2020  . IRRIGATION AND DEBRIDEMENT KNEE Left 03/29/2020   Procedure: IRRIGATION AND DEBRIDEMENT LEFT KNEE AND TRACTION PIN;  Surgeon: Shona Needles, MD;  Location: [place];  Service: Orthopedics;  Laterality: Left;  . LACERATION REPAIR N/A 03/29/2020   Procedure: REPAIR MULTIPLE LACERATIONS FACIAL;  Surgeon: Wallace Going, DO;  Location: [place];  Service: Plastics;  Laterality: N/A;  . OPEN REDUCTION INTERNAL FIXATION ACETABULUM POSTERIOR LATERAL Left 04/01/2020   Procedure: OPEN REDUCTION INTERNAL FIXATION ACETABULUM  POSTERIOR LATERAL;  Surgeon: Shona Needles, MD;  Location: [place];  Service: Orthopedics;  Laterality: Left;  . ORIF CLAVICULAR FRACTURE Right 04/03/2020   Procedure: OPEN REDUCTION INTERNAL FIXATION (ORIF) CLAVICULAR FRACTURE;  Surgeon: Shona Needles, MD;  Location: [place];  Service: Orthopedics;  Laterality: Right;  . ORIF ELBOW FRACTURE Left 04/01/2020   Procedure: OPEN REDUCTION INTERNAL FIXATION (ORIF) ELBOW/OLECRANON FRACTURE;  Surgeon: Shona Needles, MD;  Location: [place];  Service: Orthopedics;  Laterality: Left;  . ORIF MANDIBULAR FRACTURE Bilateral 04/06/2020   Procedure: OPEN REDUCTION INTERNAL FIXATION (ORIF) MANDIBULAR FRACTURE;  Surgeon: Cindra Presume, MD;  Location: [place];  Service: Plastics;  Laterality: Bilateral;  3.5 hours total  . ORIF NASAL FRACTURE Bilateral 04/06/2020   Procedure: OPEN REDUCTION INTERNAL FIXATION (ORIF) NASAL FRACTURE;  Surgeon: Cindra Presume, MD;  Location: [place];  Service: Plastics;  Laterality: Bilateral;  . ORIF ORBITAL FRACTURE Bilateral 04/06/2020   Procedure: OPEN REDUCTION INTERNAL FIXATION (ORIF) ORBITAL FRACTURE;  Surgeon: Cindra Presume, MD;  Location: [place];  Service: Plastics;  Laterality: Bilateral;  . PEG PLACEMENT N/A 04/06/2020   Procedure: PERCUTANEOUS ENDOSCOPIC GASTROSTOMY (PEG) PLACEMENT;  Surgeon: Jesusita Oka, MD;  Location: [place];  Service: General;  Laterality: N/A;  . TRACHEOSTOMY TUBE PLACEMENT N/A 04/06/2020   Procedure: TRACHEOSTOMY;  Surgeon: Jesusita Oka, MD;  Location: [place];  Service: General;  Laterality: N/A;   Social History:  reports that he has been smoking cigarettes. He has never used smokeless tobacco. He reports current alcohol use. He reports current drug use. Drug: Marijuana.  Family / Support Systems Marital Status: Single Patient Roles: Parent Spouse/Significant Other: N/A Children: A 5  y.o. son Other Supports: family Anticipated Caregiver: Mother Ability/Limitations of Caregiver: None  reported Caregiver Availability: 24/7 Family Dynamics: Pt lives with mother.  Social History Preferred language: English Religion: None Cultural Background: Pt was working at a [employer] for the last year once hired full time through employer vs [employer]. Education: 10th grade Read: Yes Write: Yes Employment Status: Employed Name of Employer: [employer] Length of Employment: 1 (year) Return to Work Plans: TBD Public relations account executive Issues: Mother reports jail time 3 years ago, and no longer on probation Guardian/Conservator: N/A   Abuse/Neglect Abuse/Neglect Assessment Can Be Completed: Unable to assess, patient is non-responsive or altered mental status Physical Abuse: Denies Verbal Abuse: Denies Sexual Abuse: Denies Exploitation of patient/patient's resources: Denies Self-Neglect: Denies  Emotional Status Pt's affect, behavior and adjustment status: NA Recent Psychosocial Issues: TBI as of now. [place] Psychiatric History: Pt mother reports pt has underlying psych but he never went to get tested. Reports family hx of psych- she has bipolar and intermittent explosive disorder and pt father has hx of schizophrenia. Substance Abuse History: Pt mother reports pt smokes cigarettes- 1pk can last up to 3 days; daily marijuana use, and drinks daily 2-3 bootleggers and 2 Budlight+. States pt has stopped rec pill use such as mollys, percs, etc.  Patient / Family Perceptions, Expectations & Goals Pt/Family understanding of illness & functional limitations: Pt mother has general understanding of pt care needs Premorbid pt/family roles/activities: Independent Anticipated changes in roles/activities/participation: Assistance with ADLs/IADLs  [place] Agencies: None Premorbid Home Care/DME Agencies: None Transportation available at discharge: Mother Resource referrals recommended: Neuropsychology  Discharge Planning Living Arrangements: Parent Support Systems:  Parent Type of Residence: Private residence Insurance Resources: Self-pay Financial Resources: [place] Referred: No (To be screened. SW received updates from Kenda/Med Assist.) Living Expenses: Lives with family Money Management: Patient Does the patient have any problems obtaining your medications?: No Home Management: Pt helped managed homecare needs Patient/Family Preliminary Plans: TBD Care Coordinator Barriers to Discharge: Decreased caregiver support,Lack of/limited family support,Insurance for SNF coverage,Other (comments) Care Coordinator Barriers to Discharge Comments: Uninsured, TF Care Coordinator Anticipated Follow Up Needs: HH/OP Expected length of stay: 4-5 weeks  Clinical Impression SW met with pt mother to complete assessment as pt was sleeping. Pt will return to home where she will be primary caregiver. Pt does not have HCPOA. Not a veteran. Access to DME: hospital bed, RW, w/c, and bariatric BSC. Pt will need outpatient psych referral.  [name] 04/29/2020, 10:54 AM

## 2020-04-29 NOTE — Evaluation (Signed)
Speech Language Pathology Bedside Swallow Evaluation  Patient Details  Name: Jeremy George. MRN: [id number] Date of Birth: 02-18-1996  SLP Diagnosis: Dysphagia  Rehab Potential: Good ELOS: 4 weeks    Today's Date: 04/29/2020 SLP Individual Time: 1884-1660 SLP Individual Time Calculation (min): 40 min   Hospital Problem: Principal Problem:   TBI (traumatic brain injury) ([place]) Active Problems:   Protein-calorie malnutrition, severe  Past Medical History: History reviewed. No pertinent past medical history. Past Surgical History:  Past Surgical History:  Procedure Laterality Date  . APPLICATION OF WOUND VAC Left 03/29/2020   Procedure: APPLICATION OF WOUND VAC LEFT ELBOW;  Surgeon: Shona Needles, MD;  Location: [place];  Service: Orthopedics;  Laterality: Left;  . ESOPHAGOGASTRODUODENOSCOPY N/A 04/06/2020   Procedure: ESOPHAGOGASTRODUODENOSCOPY (EGD);  Surgeon: Jesusita Oka, MD;  Location: [facility] ENDOSCOPY;  Service: General;  Laterality: N/A;  . EXTERNAL FIXATION LEG Left 03/29/2020   Procedure: EXTERNAL FIXATION ELBOW;  Surgeon: Shona Needles, MD;  Location: [place];  Service: Orthopedics;  Laterality: Left;  . HIP CLOSED REDUCTION Left 03/29/2020   Procedure: CLOSED REDUCTION HIP;  Surgeon: Shona Needles, MD;  Location: [place];  Service: Orthopedics;  Laterality: Left;  . I & D EXTREMITY Left 03/29/2020   Procedure: IRRIGATION AND DEBRIDEMENT LEFT ELBOW;  Surgeon: Shona Needles, MD;  Location: [place];  Service: Orthopedics;  Laterality: Left;  . IR REPLC GASTRO/COLONIC TUBE PERCUT W/FLUORO  04/27/2020  . IRRIGATION AND DEBRIDEMENT KNEE Left 03/29/2020   Procedure: IRRIGATION AND DEBRIDEMENT LEFT KNEE AND TRACTION PIN;  Surgeon: Shona Needles, MD;  Location: [place];  Service: Orthopedics;  Laterality: Left;  . LACERATION REPAIR N/A 03/29/2020   Procedure: REPAIR MULTIPLE LACERATIONS FACIAL;  Surgeon: Wallace Going, DO;  Location: [place];  Service: Plastics;  Laterality: N/A;  .  OPEN REDUCTION INTERNAL FIXATION ACETABULUM POSTERIOR LATERAL Left 04/01/2020   Procedure: OPEN REDUCTION INTERNAL FIXATION ACETABULUM POSTERIOR LATERAL;  Surgeon: Shona Needles, MD;  Location: [place];  Service: Orthopedics;  Laterality: Left;  . ORIF CLAVICULAR FRACTURE Right 04/03/2020   Procedure: OPEN REDUCTION INTERNAL FIXATION (ORIF) CLAVICULAR FRACTURE;  Surgeon: Shona Needles, MD;  Location: [place];  Service: Orthopedics;  Laterality: Right;  . ORIF ELBOW FRACTURE Left 04/01/2020   Procedure: OPEN REDUCTION INTERNAL FIXATION (ORIF) ELBOW/OLECRANON FRACTURE;  Surgeon: Shona Needles, MD;  Location: [place];  Service: Orthopedics;  Laterality: Left;  . ORIF MANDIBULAR FRACTURE Bilateral 04/06/2020   Procedure: OPEN REDUCTION INTERNAL FIXATION (ORIF) MANDIBULAR FRACTURE;  Surgeon: Cindra Presume, MD;  Location: [place];  Service: Plastics;  Laterality: Bilateral;  3.5 hours total  . ORIF NASAL FRACTURE Bilateral 04/06/2020   Procedure: OPEN REDUCTION INTERNAL FIXATION (ORIF) NASAL FRACTURE;  Surgeon: Cindra Presume, MD;  Location: [place];  Service: Plastics;  Laterality: Bilateral;  . ORIF ORBITAL FRACTURE Bilateral 04/06/2020   Procedure: OPEN REDUCTION INTERNAL FIXATION (ORIF) ORBITAL FRACTURE;  Surgeon: Cindra Presume, MD;  Location: [place];  Service: Plastics;  Laterality: Bilateral;  . PEG PLACEMENT N/A 04/06/2020   Procedure: PERCUTANEOUS ENDOSCOPIC GASTROSTOMY (PEG) PLACEMENT;  Surgeon: Jesusita Oka, MD;  Location: [place];  Service: General;  Laterality: N/A;  . TRACHEOSTOMY TUBE PLACEMENT N/A 04/06/2020   Procedure: TRACHEOSTOMY;  Surgeon: Jesusita Oka, MD;  Location: [place];  Service: General;  Laterality: N/A;    Assessment / Plan / Recommendation Clinical Impression Patient much more alert today with increased ability to participate. SLP provided  thorough oral care via the suction toothbrush and removed a significant amount of dried secretions of his lingual musculature. Trials  of ice chips and thin liquids resulted in poor lingual manipulation and poor leap seal resulting in anterior spillage. Patient demonstrated what appeared to be a delayed swallow initiation with use of multiple swallows. Delayed coughing was also noted. Patient with minimal PO intake of pureed textures with minimal opening of the oral cavity with poor oral manipulation. Recommend patient continue current diet of Dys. 1 textures with nectar-thick liquids with the water protocol. Patient will need a repeat MBS prior to upgrade to thin liquids.  Patient and mother provided education.     Skilled Therapeutic Interventions          Administered a BSE, please see above for details. Patient verbalizing at the phrase and sentence level with Max verbal cues needed for use of speech intelligibility strategies to achieve ~50% intelligibility. Patient was also intermittently restless while upright in the wheelchair but was easily redirected. Patient left upright in wheelchair with RN and mother present.     SLP Assessment  Patient will need skilled Speech Lanaguage Pathology Services during CIR admission    Recommendations  SLP Diet Recommendations: Dysphagia 1 (Puree);Nectar;Free water protocol after oral care Liquid Administration via: Spoon;Cup Medication Administration: Via alternative means Supervision: Full supervision/cueing for compensatory strategies;Staff to assist with self feeding Compensations: Minimize environmental distractions;Slow rate;Small sips/bites Postural Changes and/or Swallow Maneuvers: Seated upright 90 degrees;Upright 30-60 min after meal Oral Care Recommendations: Oral care prior to ice chip/H20;Oral care QID Patient destination: Home Follow up Recommendations: 24 hour supervision/assistance;Outpatient SLP Equipment Recommended: To be determined    SLP Frequency 3 to 5 out of 7 days   SLP Duration  SLP Intensity  SLP Treatment/Interventions 4 weeks  Minumum of 1-2 x/day, 30  to 90 minutes  Dysphagia/aspiration precaution training;Therapeutic Activities;Environmental controls;Cueing hierarchy;Functional tasks;Patient/family education    Pain Pain Assessment Pain Scale: 0-10 Pain Score: Asleep Faces Pain Scale: Hurts even more Pain Type: Acute pain Pain Location: Generalized Pain Intervention(s): Medication (See eMAR)   Care Tool Care Tool Cognition Expression of Ideas and Wants Expression of Ideas and Wants: Frequent difficulty - frequently exhibits difficulty with expressing needs and ideas   Understanding Verbal and Non-Verbal Content Understanding Verbal and Non-Verbal Content: Sometimes understands - understands only basic conversations or simple, direct phrases. Frequently requires cues to understand   Memory/Recall Ability *first 3 days only Memory/Recall Ability *first 3 days only: Current season    Bedside Swallowing Assessment General Date of Onset: 03/29/20 Previous Swallow Assessment: MBS 2/22: Recommended Dys. 1 textures with nectar-thick liquids Diet Prior to this Study: Dysphagia 1 (puree);Nectar-thick liquids Temperature Spikes Noted: No Respiratory Status: Room air History of Recent Intubation: No Behavior/Cognition: Requires cueing;Confused;Lethargic/Drowsy Oral Cavity - Dentition: Adequate natural dentition Self-Feeding Abilities: Needs assist Vision: Functional for self-feeding Patient Positioning: Upright in chair/Tumbleform Baseline Vocal Quality: Normal Volitional Cough: Strong Volitional Swallow: Able to elicit  Ice Chips Ice chips: Impaired Presentation: Spoon Oral Phase Impairments: Impaired mastication;Reduced lingual movement/coordination Oral Phase Functional Implications: Prolonged oral transit;Left anterior spillage Pharyngeal Phase Impairments: Suspected delayed Swallow;Multiple swallows Thin Liquid Thin Liquid: Impaired Presentation: Spoon;Cup Oral Phase Impairments: Reduced labial seal;Reduced lingual  movement/coordination Oral Phase Functional Implications: Prolonged oral transit;Right anterior spillage Pharyngeal  Phase Impairments: Throat Clearing - Delayed;Cough - Delayed;Multiple swallows Nectar Thick Nectar Thick Liquid: Impaired Presentation: Cup Oral Phase Impairments: Reduced lingual movement/coordination Oral phase functional implications: Prolonged oral transit Pharyngeal Phase Impairments: Multiple  swallows Honey Thick Honey Thick Liquid: Not tested Puree Puree: Impaired Presentation: Spoon Oral Phase Impairments: Reduced lingual movement/coordination Oral Phase Functional Implications: Prolonged oral transit Pharyngeal Phase Impairments: Multiple swallows Solid Solid: Not tested BSE Assessment Risk for Aspiration Impact on safety and function: Mild aspiration risk;Moderate aspiration risk Other Related Risk Factors: Deconditioning;Cognitive impairment;Lethargy  Short Term Goals: Week 1: SLP Short Term Goal 1 (Week 1): Patient will demonstrate sustained attention to functional tasks for 5 mintues with Mod verbal cues for redirection. SLP Short Term Goal 2 (Week 1): Patient will utilize speech intelligibility strategies at the phrase level to achieve ~50% intelligibility with Max A multimodal cues. SLP Short Term Goal 3 (Week 1): Patient will utilize external aids for orientation X 4 with Mod A multimodal cues. SLP Short Term Goal 4 (Week 1): Patient will consume current diet with minimal overt s/s of aspiration with Mod verbal cues for use of swallowing compensatory strategies. SLP Short Term Goal 5 (Week 1): Patient will consume trials of thin liquids with minimal overt s/s of aspiration over 2 sessions to asses readiness for repeat MBS. SLP Short Term Goal 6 (Week 1): Patient will demonstrate efficient mastication and complete oral clearance with trials of Dys. 2 textures without overt s/s of aspiration and Min verbal cues over 2 sessions prior to upgrade.  Refer to  Care Plan for Long Term Goals  Recommendations for other services: None   Discharge Criteria: Patient will be discharged from SLP if patient refuses treatment 3 consecutive times without medical reason, if treatment goals not met, if there is a change in medical status, if patient makes no progress towards goals or if patient is discharged from hospital.  The above assessment, treatment plan, treatment alternatives and goals were discussed and mutually agreed upon: by family  Pamela Maddy 04/29/2020, 3:28 PM

## 2020-04-29 NOTE — Plan of Care (Addendum)
Behavioral Plan: Jeremy George   [place] Level: VI  Behavior to decrease/ eliminate:    -pain  Changes to environment:  -Blind open/light on during the day to promote sleep/wake  -TV off between 10pm-7am, play music at night for improved sleep quality  Interventions: Patient's mother cleared for transfers and showering using w/c for transfers Encourage OOB and L hip extension in lying  Recommendations for interactions with patient:  -educate patient about pain management to avoid elevated pain levels   Precautions:  LUE non-weightbearing; encourage AROM LLE WBAT- no ROM limitations   Attendees:  Blanch Media, OT Serina Cowper, PT Feliberto Gottron, SLP Kennyth Arnold, RN

## 2020-04-29 NOTE — Progress Notes (Signed)
Entered pt's room. Family reports pt will not keep his hip abductor pillow in place. This nurse suggested pillows, but family reports pt will not keep those in place either. Will attempt again later when pt is more calm.

## 2020-04-30 LAB — GLUCOSE, CAPILLARY: Glucose-Capillary: 108 mg/dL — ABNORMAL HIGH (ref 70–99)

## 2020-04-30 MED ORDER — PROPRANOLOL HCL 10 MG PO TABS
10.0000 mg | ORAL_TABLET | Freq: Two times a day (BID) | ORAL | Status: DC
  Administered 2020-04-30 – 2020-05-01 (×2): 10 mg via ORAL
  Filled 2020-04-30 (×2): qty 1
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
## 2020-04-30 NOTE — Progress Notes (Signed)
  Subjective: 25 year old male status post open treatment of multiple facial fractures and closed reduction of comminuted displaced bilateral nasal bones with Dr. Arita Miss on 04/06/2020.  Patient has been transferred to rehabilitation facility within [facility].  Patient evaluated today, mother at bedside.  Mother reports that he is having difficulty with opening his mouth, she reports he is able to eat, however feels that this if it is difficult due to his difficulty with opening his mouth.  Patient is awake and answering questions on exam today.  Patient reports he is breathing normally through his nose  Objective: Vital signs in last 24 hours: Temp:  [97.5 F (36.4 C)-98.7 F (37.1 C)] 97.5 F (36.4 C) (03/10 1401) Pulse Rate:  [113-124] 113 (03/10 1401) Resp:  [14-19] 19 (03/10 1401) BP: (117-144)/(72-98) 138/90 (03/10 1401) SpO2:  [97 %-100 %] 100 % (03/10 1401) Weight:  [67.1 kg] 67.1 kg (03/10 0500) Last BM Date: 04/27/20  Intake/Output from previous day: 03/09 0701 - 03/10 0700 In: 878 [NG/GT:878] Out: 4 [Urine:4] Intake/Output this shift: Total I/O In: 120 [P.O.:120] Out: -   General appearance: Laying in bed, mother at bedside, no acute distress Head: Swelling has improved, Eyes: Conjunctive are clear, pupils equal and round. Nose: [place] splint no longer in place, nares patent. Neck: Mepilex border dressing over trach site Incision/Wound: Facial incisions are well-healed.  Intraoral incisions are well-healed.  MMF bars in place, no erythema or swelling noted.  Lab Results:  CBC Latest Ref Rng & Units 04/28/2020 04/27/2020 04/26/2020  WBC 4.0 - 10.5 K/uL 16.9(H) 19.9(H) 17.8(H)  Hemoglobin 13.0 - 17.0 g/dL 12.3(L) 12.0(L) 12.4(L)  Hematocrit 39.0 - 52.0 % 39.4 36.8(L) 39.4  Platelets 150 - 400 K/uL 562(H) 567(H) 594(H)    BMET Recent Labs    04/27/20 1716 04/28/20 0601  NA  --  144  K  --  3.9  CL  --  105  CO2  --  23  GLUCOSE  --  136*  BUN  --   27*  CREATININE 0.86 0.84  CALCIUM  --  10.7*   PT/INR No results for input(s): LABPROT, INR in the last 72 hours. ABG No results for input(s): PHART, HCO3 in the last 72 hours.  Invalid input(s): PCO2, PO2  Studies/Results: No results found.  Anti-infectives: Anti-infectives (From admission, onward)   None      Assessment/Plan: Status post open reduction internal fixation of multiple facial fractures and closed reduction of comminuted displaced bilateral nasal bones with Dr. Arita Miss on 04/06/2020.  Patient is approximately 4 weeks out, everything appears stable on exam.    Patient is having difficulty with opening his mouth, no objective findings on exam suggestive of causes, can be related to muscle tightening, patient's mental status.  However, will assess patient's mandibular range of motion while under anesthesia for removal of his MMF bars on 05/18/2020.  If no etiology found, may benefit from therapy 6 weeks post fixation.  Fascial incision/lacerations are well-healed.  I discussed the plan to return to the operating room with his mother today for removal of the MMF bars, all her questions were answered.  We will continue to follow.  Please call with questions or concerns.    LOS: 3 days    Leslee Home, PA-C 04/30/2020
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
## 2020-04-30 NOTE — Progress Notes (Signed)
Pt now wearing hip abductor

## 2020-04-30 NOTE — Progress Notes (Signed)
Occupational Therapy Session Note  Patient Details  Name: Jeremy George. MRN: [id number] Date of Birth: 1995/08/20  Today's Date: 04/30/2020 OT Individual Time: 0800-0900 OT Individual Time Calculation (min): 60 min    Short Term Goals: Week 1:  OT Short Term Goal 1 (Week 1): Pt will sit EOB with max A during ADLs OT Short Term Goal 2 (Week 1): Pt will attend to oral care task with no more than mod cueing OT Short Term Goal 3 (Week 1): Pt will wash UB with max A  Skilled Therapeutic Interventions/Progress Updates:    1:1. Pt received in bed awake. Pt saturated with urine and able ot cleanse peri area. Pt requires total A +2 for rolling with increased care for managing LLE and hip precautions. Pt eventaully initiates reaching with RUE to bed rail to A with rolling. Pt requries total 2 for dressing and lateral scoot transfers. At BITS seated with ficus on head/neck/trunk control pt initiates reaching to targets with visual perception issues definitie and pt reporting diplopia. Pt able ot recall activity at board reachinf for screen prior to OT setting up. Pt reporting need to toilet and placed on bedpan with no void after increased time. exercise   Therapy Documentation Precautions:  Precautions Precautions: Fall,Posterior Hip Precaution Booklet Issued: No Precaution Comments: PEG, posterior hip precautions Restrictions Weight Bearing Restrictions: Yes RUE Weight Bearing: Weight bearing as tolerated LUE Weight Bearing: Non weight bearing LLE Weight Bearing: Touchdown weight bearing General:   Vital Signs: Therapy Vitals Temp: 98.2 F (36.8 C) Temp Source: Oral Pulse Rate: (!) 122 Resp: 14 BP: 133/85 Patient Position (if appropriate): Lying Oxygen Therapy SpO2: 100 % O2 Device: Room Air Pain: Pain Assessment Pain Scale: Faces Faces Pain Scale: Hurts even more Pain Type: Acute pain Pain Intervention(s): Medication (See eMAR) ADL: ADL Eating: Dependent Where  Assessed-Eating: Edge of bed Grooming: Maximal assistance Where Assessed-Grooming: Edge of bed Upper Body Bathing: Unable to assess Lower Body Bathing: Unable to assess Upper Body Dressing: Unable to assess Lower Body Dressing: Unable to assess Toileting: Unable to assess Toilet Transfer: Unable to assess Tub/Shower Transfer: Unable to assess Psychologist, counselling Transfer: Unable to assess Film/video editor Method: Unable to assess Vision   Perception    Praxis   Exercises:   Other Treatments:     Therapy/Group: Individual Therapy  Shon Hale 04/30/2020, 6:52 AM

## 2020-04-30 NOTE — Progress Notes (Signed)
Speech Language Pathology Daily Session Note  Patient Details  Name: Jeremy George. MRN: [id number] Date of Birth: 09-Oct-1995  Today's Date: 04/30/2020 SLP Individual Time: 1100-1135 SLP Individual Time Calculation (min): 35 min and Today's Date: 04/30/2020 SLP Missed Time: 10 Minutes Missed Time Reason: Patient fatigue  Short Term Goals: Week 1: SLP Short Term Goal 1 (Week 1): Patient will demonstrate sustained attention to functional tasks for 5 mintues with Mod verbal cues for redirection. SLP Short Term Goal 2 (Week 1): Patient will utilize speech intelligibility strategies at the phrase level to achieve ~50% intelligibility with Max A multimodal cues. SLP Short Term Goal 3 (Week 1): Patient will utilize external aids for orientation X 4 with Mod A multimodal cues. SLP Short Term Goal 4 (Week 1): Patient will consume current diet with minimal overt s/s of aspiration with Mod verbal cues for use of swallowing compensatory strategies. SLP Short Term Goal 5 (Week 1): Patient will consume trials of thin liquids with minimal overt s/s of aspiration over 2 sessions to asses readiness for repeat MBS. SLP Short Term Goal 6 (Week 1): Patient will demonstrate efficient mastication and complete oral clearance with trials of Dys. 2 textures without overt s/s of aspiration and Min verbal cues over 2 sessions prior to upgrade.  Skilled Therapeutic Interventions: Skilled treatment session focused on dysphagia and speech goals. Upon arrival, patient was lethargic while sitting upright in the tilt-in-space wheelchair. Patient with increased restlessness due to wanting to get back into bed. Patient eventually sliding his hips forward in the chair causing concern for safety. Therefore, patient was transferred back to bed via the slideboard with +2 assist. When in bed, patient remained lethargic despite Max encouragement from SLP. Patient consumed minimal trials if ice chips with Max verbal cues needed to  accept bolus from the spoon and for oral manipulation. One overt coughing episode noted, suspect due to poor awareness of bolus. Patient also consumed several small sips of thin liquids via straw with a poor lip seal requiring maximum effort. Patient with intermittent attempts to verbalize with Max A multimodal cues needed for use of speech intelligibility strategies to maximize intelligibility to ~50% at the phrase level. Patient eventually fell asleep and was unable to participate efficiently in session, therefore, patient missed remaining 10 minutes of session. Patient left upright in bed with alarm on and all needs within reach. Continue with current plan of care.      Pain No/Denies Pain   Therapy/Group: Individual Therapy  Kwaku Mostafa 04/30/2020, 3:15 PM

## 2020-04-30 NOTE — Progress Notes (Signed)
Physical Therapy Session Note  Patient Details  Name: Jeremy George. MRN: [id number] Date of Birth: 04/29/1995  Today's Date: 04/30/2020 PT Individual Time: 1000-1100 PT Individual Time Calculation (min): 60 min   Short Term Goals: Week 1:  PT Short Term Goal 1 (Week 1): Patient will perform rolling L with mod A. PT Short Term Goal 2 (Week 1): Patient will perform supine>sit using log roll technique to the L with max A. PT Short Term Goal 3 (Week 1): Patient will perform basic transfers with max A +2. PT Short Term Goal 4 (Week 1): Patient will tolerate sitting OOB >30 min 2/7 days.  Skilled Therapeutic Interventions/Progress Updates:     Patient in bed with his mother and RN at bedside upon PT arrival. Patient alert and agreeable to PT session. Patient denied pain during session. Patient with increased verbalizations during session, continued to require cues for enunciation. Patient able to tell therapist that he likes Donn Pierini, PT played music and patient intermittently singing and dancing during first half of session.  Therapeutic Activity: Bed Mobility: Donned/doffed shorts with total A bed level during session. Patient performed supine to/from sit with mod-max a +2. Provided verbal cues for log roll technique with pillow between his legs to maintain neutral L hip positioning, and use of R elbow to push-up and lower his trunk for improved trunk control. Patient sat EOB with max progressing to min A for sitting balance <2 min, elevated bed to reduce hip flexion angle and promote forward trunk flexion to reduce posterior bias.  Transfers: Patient performed slide board transfers bed<>w/c with max A +2 with PT's foot holding patient's foot off the floor with knee extended for TDWB and total A for board placement. Provided cues for R hand placement and L hand in lap, board placement, and head-hips relationship for proper technique and decreased assist with transfers.  Patient  performed sit to/from stand x2 with max +2. First trial in // bars with R upper extremity support only, 1 person max A in front and a second person on L side holding L foot up with reacher, blocking L hand from grabbing, and facilitating hip extension. Second trial with 3 musketeer technique with arms down and PT's foot under the patient's for TDWB. Provided verbal cues for initiation, patient required repetitive cues with delayed response and facilitation to initiate all mobility.  Wheelchair Mobility:  Patient was transported in TIS w/c with total A throughout session for energy conservation and time management. PT adjusted leg rests to extend to fit patient's leg length, also switched out R leg rest to match L during session. Patient singing and swaying in the chair while listening to music. Provided patient with a wash cloth with instructions to fold the cloth to occupy his hands from clothing or tubes in sitting.   Paitent required increased time and rest breaks due to delayed initiation, decreased attention, and increased fatigue with mobility throughout session.   Returned patient to bed, see mobility above, with SLP during ST session due to patient fatiguing in sitting in TIS w/c and sliding his hips forward.   Patient in bed handed off to SLP at end of session.    Therapy Documentation Precautions:  Precautions Precautions: Fall,Posterior Hip Precaution Booklet Issued: No Precaution Comments: PEG, posterior hip precautions Restrictions Weight Bearing Restrictions: Yes RUE Weight Bearing: Weight bearing as tolerated LUE Weight Bearing: Non weight bearing LLE Weight Bearing: Touchdown weight bearing   Therapy/Group: Individual Therapy and Co-Treatment  Cherie  L Grunenberg PT, DPT  04/30/2020, 4:11 PM

## 2020-05-01 ENCOUNTER — Encounter: Payer: Self-pay | Admitting: Physical Medicine and Rehabilitation

## 2020-05-01 MED ORDER — OSMOLITE 1.5 CAL PO LIQD
1000.0000 mL | ORAL | Status: DC
  Administered 2020-05-01 – 2020-05-04 (×5): 1000 mL
  Filled 2020-05-01 (×4): qty 1000

## 2020-05-01 MED ORDER — METHYLPHENIDATE HCL 5 MG PO TABS
5.0000 mg | ORAL_TABLET | Freq: Every day | ORAL | Status: DC
  Administered 2020-05-02 – 2020-05-04 (×3): 5 mg via ORAL
  Filled 2020-05-01 (×3): qty 1

## 2020-05-01 MED ORDER — LIDOCAINE 5 % EX PTCH
1.0000 | MEDICATED_PATCH | CUTANEOUS | Status: DC
  Administered 2020-05-01 – 2020-06-01 (×25): 1 via TRANSDERMAL
  Filled 2020-05-01 (×28): qty 1

## 2020-05-01 MED ORDER — FREE WATER
150.0000 mL | Freq: Four times a day (QID) | Status: DC
  Administered 2020-05-01 – 2020-05-18 (×61): 150 mL

## 2020-05-01 MED ORDER — PROPRANOLOL HCL 10 MG PO TABS
10.0000 mg | ORAL_TABLET | Freq: Three times a day (TID) | ORAL | Status: DC
  Administered 2020-05-01 – 2020-05-02 (×3): 10 mg via ORAL
  Filled 2020-05-01 (×3): qty 1

## 2020-05-01 NOTE — Progress Notes (Signed)
Rec'd call from telesitter that pt was trying to get out of bed. Pt very determined stating "I am going home". Multiple attempts to redirect pt back to bed with no results. Staff assisted pt to bed. Pt wanting to have mother called. Attempted to call pt's mother with number provided with no answer. Pt's primary nurse provided pt with HS medications. This nurse stayed with pt until he started to get tired. Pt tucked into bed with bed alarm on and bed at lowest position.

## 2020-05-01 NOTE — Progress Notes (Signed)
Occupational Therapy Session Note  Patient Details  Name: Jeremy George. MRN: [id number] Date of Birth: [birth year]  Today's Date: 05/01/2020 OT Individual Time: 1000-1100 OT Individual Time Calculation (min): 60 min    Short Term Goals: Week 1:  OT Short Term Goal 1 (Week 1): Pt will sit EOB with max A during ADLs OT Short Term Goal 2 (Week 1): Pt will attend to oral care task with no more than mod cueing OT Short Term Goal 3 (Week 1): Pt will wash UB with max A  Skilled Therapeutic Interventions/Progress Updates:    1;1. Pt received in bed agreeable to OT after increased time to arouse while RN adminstering meds. Pt engages in ADL with pt able to doff R sock and attempts to bed L knee to doff but OT educates on hip precautoins and completes for pt. Pt requires HOH A to thread RLE into pants and OT threads into pants. Pt completes Rolling with increased time to initate/reach with RUE to bed rail and roll to L whie OT advances pants past hips. Pt attempted to bridge hips but continue to ed on precautions. Pt completes sup>sit with MAX A of 1 and requries total A with OT sittin on L using R knee to support upright/semi reclined posture with VC for head control. Pt able to initate bathing and UB dressing tasks, but overall requires MAX A. Pt able to verbalize steps for donning shirt. Exited session with pt seated in bed, exit alarm on and call light in reach. Pt missed 15 min skilled OT d/t max fatigue and falling asleep after EOB activities.    Therapy Documentation Precautions:  Precautions Precautions: Fall,Posterior Hip Precaution Booklet Issued: No Precaution Comments: PEG, posterior hip precautions Restrictions Weight Bearing Restrictions: Yes RUE Weight Bearing: Weight bearing as tolerated LUE Weight Bearing: Non weight bearing LLE Weight Bearing: Touchdown weight bearing General:   Vital Signs: Therapy Vitals Temp: 98.3 F (36.8 C) Temp Source: Axillary Pulse Rate:  (!) 118 Resp: 19 BP: (!) 130/93 Patient Position (if appropriate): Lying Oxygen Therapy SpO2: 100 % O2 Device: Room Air Pain:   ADL: ADL Eating: Dependent Where Assessed-Eating: Edge of bed Grooming: Maximal assistance Where Assessed-Grooming: Edge of bed Upper Body Bathing: Unable to assess Lower Body Bathing: Unable to assess Upper Body Dressing: Unable to assess Lower Body Dressing: Unable to assess Toileting: Unable to assess Toilet Transfer: Unable to assess Tub/Shower Transfer: Unable to assess Psychologist, counselling Transfer: Unable to assess Film/video editor Method: Unable to assess Vision   Perception    Praxis   Exercises:   Other Treatments:     Therapy/Group: Individual Therapy  Shon Hale 05/01/2020, 6:42 AM
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
## 2020-05-01 NOTE — Progress Notes (Signed)
Speech Language Pathology Daily Session Note  Patient Details  Name: Jeremy George. MRN: [id number] Date of Birth: 27-Jun-1995  Today's Date: 05/01/2020 SLP Individual Time: 9024-0973 SLP Individual Time Calculation (min): 45 min  Short Term Goals: Week 1: SLP Short Term Goal 1 (Week 1): Patient will demonstrate sustained attention to functional tasks for 5 mintues with Mod verbal cues for redirection. SLP Short Term Goal 2 (Week 1): Patient will utilize speech intelligibility strategies at the phrase level to achieve ~50% intelligibility with Max A multimodal cues. SLP Short Term Goal 3 (Week 1): Patient will utilize external aids for orientation X 4 with Mod A multimodal cues. SLP Short Term Goal 4 (Week 1): Patient will consume current diet with minimal overt s/s of aspiration with Mod verbal cues for use of swallowing compensatory strategies. SLP Short Term Goal 5 (Week 1): Patient will consume trials of thin liquids with minimal overt s/s of aspiration over 2 sessions to asses readiness for repeat MBS. SLP Short Term Goal 6 (Week 1): Patient will demonstrate efficient mastication and complete oral clearance with trials of Dys. 2 textures without overt s/s of aspiration and Min verbal cues over 2 sessions prior to upgrade.  Skilled Therapeutic Interventions: Skilled treatment session focused on dysphagia and speech goals. SLP facilitated session by providing trials of ice chips. Patient required hand over hand assist for self-feeding and required Max A verbal cues to open his oral cavity and for increased oral manipulation/mastication of bolus. No overt s/s of aspiration noted. Patient declined all Dys. 1 textures from tray but did consume 2 small bites of applesauce. Patient with minimal oral manipulation with oral residue noted. Spoke to patient's mother about bringing food from the outside that patient may like. Also discussed bringing in a food processor to further puree foods and  possibly adding thickener to maximize options. She verbalized understanding and agreement. Throughout session, patient more verbose but required Max A verbal cues for use of speech intelligibility strategies to achieve ~75% intelligibility at the phrase level. Patient left upright in bed with alarm on and all needs within reach. Continue with current plan of care.      Pain No/Denies Pain   Therapy/Group: Individual Therapy  Conny Situ 05/01/2020, 2:40 PM

## 2020-05-02 DIAGNOSIS — G8918 Other acute postprocedural pain: Secondary | ICD-10-CM

## 2020-05-02 DIAGNOSIS — D72829 Elevated white blood cell count, unspecified: Secondary | ICD-10-CM

## 2020-05-02 DIAGNOSIS — R000 Tachycardia, unspecified: Secondary | ICD-10-CM

## 2020-05-02 DIAGNOSIS — S069X9D Unspecified intracranial injury with loss of consciousness of unspecified duration, subsequent encounter: Secondary | ICD-10-CM

## 2020-05-02 MED ORDER — PROPRANOLOL HCL 20 MG PO TABS
60.0000 mg | ORAL_TABLET | Freq: Three times a day (TID) | ORAL | Status: DC
  Administered 2020-05-02 – 2020-05-05 (×11): 60 mg via ORAL
  Filled 2020-05-02 (×14): qty 3
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
## 2020-05-03 DIAGNOSIS — D75839 Thrombocytosis, unspecified: Secondary | ICD-10-CM

## 2020-05-03 NOTE — Progress Notes (Addendum)
Physical Therapy Session Note  Patient Details  Name: Jeremy George. MRN: [id number] Date of Birth: 1995/08/26  Today's Date: 05/03/2020 PT Individual Time: 1300-1355 PT Individual Time Calculation (min): 55 min   Short Term Goals: Week 1:  PT Short Term Goal 1 (Week 1): Patient will perform rolling L with mod A. PT Short Term Goal 2 (Week 1): Patient will perform supine>sit using log roll technique to the L with max A. PT Short Term Goal 3 (Week 1): Patient will perform basic transfers with max A +2. PT Short Term Goal 4 (Week 1): Patient will tolerate sitting OOB >30 min 2/7 days.  Skilled Therapeutic Interventions/Progress Updates:     Patient in bed with his mother at bedside upon PT arrival. Patient alert and agreeable to PT session. Patient indicated L upper extremity and L hip pain with mobility during session, RN made aware. PT provided repositioning, rest breaks, and distraction as pain interventions throughout session.   Provided orientation to patient and education of patient's deficits throughout session.  Patient with increased arousal compared to last week, with only intermittent bouts of lethargy. Demonstrates decreased attention to tasks, especially in more stimulating environments (Day room with saff coming in/out). Attempted to eat bites of lunch tray during session, however, patient refused once he saw the pureed foot on the plate. Performed oral care to allow patient to drink water with cues for small sips. Patient without aspiration on water and followed cues for opening his mouth and sticking out his tongue during oral care. Patient with increased frequency of verbalization, however, continues to be very difficult to understand due to dysarthria. Could clearly hear one word phrases with cues to speak louder and clearer and able to distinguish one word out longer phrases of 3-4 words.   Therapeutic Activity: Bed Mobility: Donned pants and non-skid socks dependently  bed level prior to mobility. Patient performed rolling R/L with max A +2 with poor attention to cues for use of R hand to pull on rail when rolling L. He performed supine to/from sit with max A +2 due to increased fatigue this afternoon. Provided verbal cues for sequencing and log roll with pillow between his knees to maintain hip precautions. Transfers: Patient performed squat pivot transfer bed<>TIS w/c with max A of 1 and mod-min A of a second person due to poor initiation by patient during transfer. Provided verbal cues for R hand placement for pushing hips up and L hand placement between his legs, L foot placement forward to maintain L hip flexion >90 deg and TDWB, and head-hips relationship for improved hip clearance with minimal hip flexion.  Neuromuscular Re-ed in TIS w/c: Patient performed the following  activities: -attempted to toss beach ball x5, tossed ball in the air, but unable to toss it forward to therapist -handed ball to therapist x3 using B upper extremities and L shoulder flexion motion only -kicked red exercises ball x2 R/L with ball placed in front of him  Patient required significant cues and time to complete all activities due to decreased attention to task and delayed initiation  Patient in bed wit his mother at bedside at end of session with breaks locked, Posey waist belt and bed alarm set, and all needs within reach. Patient unhooking waist belt prior to therapist leaving. Secured sheets over the belt with reduced pulling and re-secured alarm belt.    Therapy Documentation Precautions:  Precautions Precautions: Fall,Posterior Hip Precaution Booklet Issued: No Precaution Comments: PEG, posterior hip precautions Restrictions  Weight Bearing Restrictions: Yes RUE Weight Bearing: Weight bearing as tolerated LUE Weight Bearing: Non weight bearing LLE Weight Bearing: Touchdown weight bearing   Therapy/Group: Individual Therapy  Cletis Clack L Miklos Bidinger PT,  DPT  05/03/2020, 4:01 PM

## 2020-05-03 NOTE — Progress Notes (Signed)
Speech Language Pathology Daily Session Note  Patient Details  Name: Jeremy George. MRN: [id number] Date of Birth: [birth year]  Today's Date: 05/03/2020 SLP Individual Time: 9242-6834 SLP Individual Time Calculation (min): 35 min  Short Term Goals: Week 1: SLP Short Term Goal 1 (Week 1): Patient will demonstrate sustained attention to functional tasks for 5 mintues with Mod verbal cues for redirection. SLP Short Term Goal 2 (Week 1): Patient will utilize speech intelligibility strategies at the phrase level to achieve ~50% intelligibility with Max A multimodal cues. SLP Short Term Goal 3 (Week 1): Patient will utilize external aids for orientation X 4 with Mod A multimodal cues. SLP Short Term Goal 4 (Week 1): Patient will consume current diet with minimal overt s/s of aspiration with Mod verbal cues for use of swallowing compensatory strategies. SLP Short Term Goal 5 (Week 1): Patient will consume trials of thin liquids with minimal overt s/s of aspiration over 2 sessions to asses readiness for repeat MBS. SLP Short Term Goal 6 (Week 1): Patient will demonstrate efficient mastication and complete oral clearance with trials of Dys. 2 textures without overt s/s of aspiration and Min verbal cues over 2 sessions prior to upgrade.  Skilled Therapeutic Interventions: Skilled SLP intervention focused on cognition and dysphagia. Pt sustained attention for 35 min session with trials of ice chips, conversation, and orientation using calendar. Oral care completed at beginning of session with sponge swab. He consumed ice chip by tsp x10 and tolerated well with no overt s/sx of aspiration or penetration. Coughing noted on 1/5 trials after swallow with single sip of thin liquids via straw. Mother reports she pureed watermelon and pt consumed this last night with no coughing observed. Pt oriented to date and day of the week and month using calendar with max assistance visual cues from SLP. Pt participated  well in skilled SLP tx. Instructed mother on oral care prior to ice chips. Mother verbalized understanding. Cont therapy per plan of care.     Pain Pain Assessment Pain Scale: Faces Faces Pain Scale: No hurt  Therapy/Group: Individual Therapy  Carlean Jews Murtaza Shell 05/03/2020, 9:42 AM

## 2020-05-03 NOTE — Progress Notes (Signed)
Occupational Therapy Session Note  Patient Details  Name: Jeremy George. MRN: [id number] Date of Birth: 04-07-1995  Today's Date: 05/03/2020 OT Individual Time: 4580-9983 OT Individual Time Calculation (min): 45 min   Session 2: OT Individual Time: [phone] OT Individual Time Calculation (min): 55 min    Short Term Goals: Week 1:  OT Short Term Goal 1 (Week 1): Pt will sit EOB with max A during ADLs OT Short Term Goal 2 (Week 1): Pt will attend to oral care task with no more than mod cueing OT Short Term Goal 3 (Week 1): Pt will wash UB with max A  Skilled Therapeutic Interventions/Progress Updates:    Session 1: Pt received supine, lethargic with mother present. Pt intermittently opening eyes and responsive to verbal and tactile cueing. Pt required max facilitation and assist to roll R and L in bed. Special care taken to preserve posterior hip precautions. Pt was transferred to EOB with total +2 assist. He perked up EOB and was able to answer orientation questions- AxO x4! Pt completed slideboard transfer to the TIS w/c with max +2 A. He was taken to the sink where he was able to initiate oral care with no cueing. Mod HOH facilitation required to facilitate. Max A overall for UB bathing. Backward chaining method used to encourage UB dressing- required max A overall. Pt was left sitting in the TIS w/c with chair alarm belt and seatbelt on- mother present.    Session 2: Pt received supine (nursing transferred pt back to bed) with no c/o pain, agreeable to OT session. Pt much more alert this session overall. Pt completed bed mobility to EOB with max +2 assist. Sit > stand from EOB with max +2 assist. OT placed foot under pt's LLE to manage TDWB precautions. Pt more like PWB and difficulty shifting weight off leg completely but d/t pain was managing not doing full weightbearing. Pt completed stand pivot transfer to his TIS w/c with max +2 assist. Total A required to pivot R foot. Pt was  taken to the therapy gym where he reported urgent BM need. Returned to room and slideboard transfer > bariatric drop arm BSC. Total A for clothing management. Continent void of BM and urine. Pt stood with mod A- heavy cueing and facilitation for LUE NWB and maintaining post hip precautions. Total A for clothing management. Squat pivot transfer to the w/c with mod A of 1, however likely poor adherence to TDWB precautions. In the gym pt completed activity focused on sitting balance, upright posture, and functional reaching. Closed chain grasping of an unweighted ball with reach overhead to basketball hoop. Pt enjoyed activity and attended to task with mod cueing for initiation and sustained attention. Pt was returned to his room and left sitting up with chair alarm belt on, seat belt fastened. Mother present.   Therapy Documentation Precautions:  Precautions Precautions: Fall,Posterior Hip Precaution Booklet Issued: No Precaution Comments: PEG, posterior hip precautions Restrictions Weight Bearing Restrictions: Yes RUE Weight Bearing: Weight bearing as tolerated LUE Weight Bearing: Non weight bearing LLE Weight Bearing: Touchdown weight bearing   Therapy/Group: Individual Therapy  Crissie Reese 05/03/2020, 7:50 AM
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
## 2020-05-04 DIAGNOSIS — E43 Unspecified severe protein-calorie malnutrition: Secondary | ICD-10-CM

## 2020-05-04 DIAGNOSIS — S069X3S Unspecified intracranial injury with loss of consciousness of 1 hour to 5 hours 59 minutes, sequela: Secondary | ICD-10-CM

## 2020-05-04 MED ORDER — METHYLPHENIDATE HCL 5 MG PO TABS
5.0000 mg | ORAL_TABLET | Freq: Two times a day (BID) | ORAL | Status: DC
  Administered 2020-05-05 – 2020-06-01 (×50): 5 mg via ORAL
  Filled 2020-05-04 (×52): qty 1

## 2020-05-04 MED ORDER — QUETIAPINE FUMARATE 200 MG PO TABS
200.0000 mg | ORAL_TABLET | Freq: Every morning | ORAL | Status: DC
  Administered 2020-05-05: 200 mg

## 2020-05-04 MED ORDER — OXYCODONE HCL 5 MG/5ML PO SOLN
2.5000 mg | Freq: Four times a day (QID) | ORAL | Status: DC | PRN
  Administered 2020-05-04 – 2020-05-06 (×4): 2.5 mg
  Filled 2020-05-04 (×7): qty 5

## 2020-05-04 MED ORDER — QUETIAPINE FUMARATE 50 MG PO TABS
500.0000 mg | ORAL_TABLET | Freq: Every day | ORAL | Status: DC
  Administered 2020-05-04: 500 mg
  Filled 2020-05-04: qty 2

## 2020-05-04 NOTE — Progress Notes (Signed)
Pt resting quietly, tube feeding restarted. Will continue to monitor.

## 2020-05-04 NOTE — Progress Notes (Signed)
Speech Language Pathology Daily Session Note  Patient Details  Name: Jeremy George. MRN: [id number] Date of Birth: 10/22/1995  Today's Date: 05/04/2020 SLP Individual Time: 0930-1015 SLP Individual Time Calculation (min): 45 min  Short Term Goals: Week 1: SLP Short Term Goal 1 (Week 1): Patient will demonstrate sustained attention to functional tasks for 5 mintues with Mod verbal cues for redirection. SLP Short Term Goal 2 (Week 1): Patient will utilize speech intelligibility strategies at the phrase level to achieve ~50% intelligibility with Max A multimodal cues. SLP Short Term Goal 3 (Week 1): Patient will utilize external aids for orientation X 4 with Mod A multimodal cues. SLP Short Term Goal 4 (Week 1): Patient will consume current diet with minimal overt s/s of aspiration with Mod verbal cues for use of swallowing compensatory strategies. SLP Short Term Goal 5 (Week 1): Patient will consume trials of thin liquids with minimal overt s/s of aspiration over 2 sessions to asses readiness for repeat MBS. SLP Short Term Goal 6 (Week 1): Patient will demonstrate efficient mastication and complete oral clearance with trials of Dys. 2 textures without overt s/s of aspiration and Min verbal cues over 2 sessions prior to upgrade.  Skilled Therapeutic Interventions: Skilled treatment session focused on dysphagia and speech goals. SLP facilitated session by providing set-up assist and Min verbal cues for problem solving and thoroughness of oral care via the suction toothbrush. SLP provided trials of ice chips with Mod verbal cues needed for a wide oral cavity and for mastication and oral manipulation. No overt s/s of aspiration noted and patient moved his oral musculature more functionally today during trials. SLP also provided trials of pureed and thickened watermelon and ice cream with patient reporting dislike of taste.  Throughout session, patient more verbose and required Mod A verbal cues for  use of speech intelligibility strategies to achieve ~75% intelligibility at the phrase and sentence level. Patient left upright in tilt-in-space wheelchair with alarm on, all needs within reach and mother present. Continue with current plan of care.          Pain Pain Assessment Faces Pain Scale: Hurts a little bit  Therapy/Group: Individual Therapy  Jeremy George 05/04/2020, 12:47 PM
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
## 2020-05-04 NOTE — Progress Notes (Signed)
Physical Therapy Session Note  Patient Details  Name: Jeremy George. MRN: [id number] Date of Birth: 1995-09-21  Today's Date: 05/04/2020 PT Individual Time: 1500-1530 PT Individual Time Calculation (min): 30 min   Short Term Goals: Week 1:  PT Short Term Goal 1 (Week 1): Patient will perform rolling L with mod A. PT Short Term Goal 2 (Week 1): Patient will perform supine>sit using log roll technique to the L with max A. PT Short Term Goal 3 (Week 1): Patient will perform basic transfers with max A +2. PT Short Term Goal 4 (Week 1): Patient will tolerate sitting OOB >30 min 2/7 days. Week 2:     Skilled Therapeutic Interventions/Progress Updates:    pt received in bed, additional person available for plus two during session. Pt attempting to pull at hip wedge throughout session constantly, demonstrating moaning, limited ability to participate, crying and poor attention to task. Pt directed in supine>sit total A x2 for trunk and BLE management. Pt then directed in static sitting mod-max A of one for 20 mins with VC for posture and attempting to have pt participate in tasks in sitting but unsuccessful. Pt then reported his R hip was hurting and he laid his trunk down at bedside, total A for BLE management to supine, total A x2 for upward scooting x3 as pt pulled himself down multiple times. Pt left at end of session, in good position, alarm set, all lines as found.   Therapy Documentation Precautions:  Precautions Precautions: Fall,Posterior Hip Precaution Booklet Issued: No Precaution Comments: PEG, posterior hip precautions Restrictions Weight Bearing Restrictions: Yes RUE Weight Bearing: Weight bearing as tolerated LUE Weight Bearing: Non weight bearing LLE Weight Bearing: Touchdown weight bearing General:   Vital Signs: Therapy Vitals Temp: 97.6 F (36.4 C) Pulse Rate: (!) 108 Resp: 15 BP: 124/81 Patient Position (if appropriate): Lying Oxygen Therapy SpO2: 100 % O2  Device: Room Air Pain: Pain Assessment Pain Scale: Faces Faces Pain Scale: Hurts a little bit Pain Type: Surgical pain Pain Location: Generalized Pain Intervention(s): Medication (See eMAR) Mobility:   Locomotion :    Trunk/Postural Assessment :    Balance:   Exercises:   Other Treatments:      Therapy/Group: Individual Therapy  Barbaraann Faster 05/04/2020, 3:57 PM
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
## 2020-05-04 NOTE — Progress Notes (Signed)
Occupational Therapy Session Note  Patient Details  Name: Jeremy George. MRN: [id number] Date of Birth: 1995/10/21  Today's Date: 05/04/2020 OT Individual Time: 1035-1110 OT Individual Time Calculation (min): 35 min    Short Term Goals: Week 1:  OT Short Term Goal 1 (Week 1): Pt will sit EOB with max A during ADLs OT Short Term Goal 2 (Week 1): Pt will attend to oral care task with no more than mod cueing OT Short Term Goal 3 (Week 1): Pt will wash UB with max A  Skilled Therapeutic Interventions/Progress Updates:    Pt received sitting in recliner, very lethargic and difficult to arouse. Attempted several times to increase arousal, ranging L elbow slightly to elicit pain response, initiating transfer, and verbal/tactile cueing with no success. Discussed nighttime events with pt's mother, and TBI education. Total A for slideboard transfer back to room. Pt required total A for positioning in bed. Special care taken to protect posterior hip precautions. Pt was left supine with all needs met, bed alarm and posey belt fastened.   Therapy Documentation Precautions:  Precautions Precautions: Fall,Posterior Hip Precaution Booklet Issued: No Precaution Comments: PEG, posterior hip precautions Restrictions Weight Bearing Restrictions: Yes RUE Weight Bearing: Weight bearing as tolerated LUE Weight Bearing: Non weight bearing LLE Weight Bearing: Touchdown weight bearing   Therapy/Group: Individual Therapy  Curtis Sites 05/04/2020, 6:30 AM

## 2020-05-04 NOTE — Progress Notes (Signed)
Pt resting at this time, occasionally attempting to get out of bed. However no physical aggression noted at this time.

## 2020-05-04 NOTE — Progress Notes (Signed)
Pt cooperative with personal care this morning, however pt becoming agitated and physically combative with phlebotomy. Will report to oncoming shift for reattempt after am medications for agitation.

## 2020-05-04 NOTE — Progress Notes (Signed)
Orthopedic Tech Progress Note Patient Details:  Jeremy George Sep 18, 1995 [id number] RN called requesting an ABDOMINAL BINDER Ortho Devices Type of Ortho Device: Abdominal binder Ortho Device/Splint Location: stomach Ortho Device/Splint Interventions: Application   Post Interventions Patient Tolerated: Well Instructions Provided: Care of device   [name] 05/04/2020, 3:45 PM

## 2020-05-05 LAB — COMPREHENSIVE METABOLIC PANEL WITH GFR
ALT: 19 U/L (ref 0–44)
AST: 19 U/L (ref 15–41)
Albumin: 2.8 g/dL — ABNORMAL LOW (ref 3.5–5.0)
Alkaline Phosphatase: 215 U/L — ABNORMAL HIGH (ref 38–126)
Anion gap: 7 (ref 5–15)
BUN: 24 mg/dL — ABNORMAL HIGH (ref 6–20)
CO2: 31 mmol/L (ref 22–32)
Calcium: 9.5 mg/dL (ref 8.9–10.3)
Chloride: 103 mmol/L (ref 98–111)
Creatinine, Ser: 0.72 mg/dL (ref 0.61–1.24)
GFR, Estimated: >60 mL/min (ref >60)
Glucose, Bld: 115 mg/dL — ABNORMAL HIGH (ref 70–99)
Potassium: 4.2 mmol/L (ref 3.5–5.1)
Sodium: 141 mmol/L (ref 135–145)
Total Bilirubin: 0.6 mg/dL (ref 0.3–1.2)
Total Protein: 8.4 g/dL — ABNORMAL HIGH (ref 6.5–8.1)

## 2020-05-05 LAB — CBC
HCT: 34.6 % — ABNORMAL LOW (ref 39.0–52.0)
Hemoglobin: 10.9 g/dL — ABNORMAL LOW (ref 13.0–17.0)
MCH: 27 pg (ref 26.0–34.0)
MCHC: 31.5 g/dL (ref 30.0–36.0)
MCV: 85.6 fL (ref 80.0–100.0)
Platelets: 416 10*3/uL — ABNORMAL HIGH (ref 150–400)
RBC: 4.04 MIL/uL — ABNORMAL LOW (ref 4.22–5.81)
RDW: 13.8 % (ref 11.5–15.5)
WBC: 8.7 10*3/uL (ref 4.0–10.5)
nRBC: 0 % (ref 0.0–0.2)

## 2020-05-05 MED ORDER — QUETIAPINE FUMARATE 200 MG PO TABS
400.0000 mg | ORAL_TABLET | Freq: Every day | ORAL | Status: DC
  Administered 2020-05-05 – 2020-05-09 (×5): 400 mg
  Filled 2020-05-05 (×5): qty 2

## 2020-05-05 MED ORDER — LORAZEPAM 1 MG PO TABS
1.0000 mg | ORAL_TABLET | Freq: Four times a day (QID) | ORAL | Status: DC | PRN
  Administered 2020-05-07 – 2020-05-17 (×7): 1 mg via ORAL
  Filled 2020-05-05 (×7): qty 1

## 2020-05-05 MED ORDER — QUETIAPINE FUMARATE 50 MG PO TABS
100.0000 mg | ORAL_TABLET | Freq: Every morning | ORAL | Status: DC
  Administered 2020-05-06 – 2020-05-09 (×4): 100 mg
  Filled 2020-05-05 (×5): qty 2

## 2020-05-05 MED ORDER — LORAZEPAM 2 MG/ML IJ SOLN
1.0000 mg | Freq: Four times a day (QID) | INTRAMUSCULAR | Status: DC | PRN
  Administered 2020-05-05: 1 mg via INTRAMUSCULAR
  Filled 2020-05-05 (×2): qty 1

## 2020-05-05 NOTE — Progress Notes (Signed)
Physical Therapy Weekly Progress Note  Patient Details  Name: Jeremy George. MRN: [id number] Date of Birth: 1995-05-11  Beginning of progress report period: April 28, 2020 End of progress report period: May 05, 2020  Today's Date: 05/05/2020 PT Individual Time: 1115-1200 PT Individual Time Calculation (min): 45 min   Patient has met 3 of 4 short term goals.  Patient with slow, but steady progress this week, limited by intermittent lethargy, poor adherence to precautions, and delayed initiation with all mobility. Patient currently requires max-total A +2 to mod A of 1 for bed mobility, max-total A +2 for slide board transfers and sit to/from stand. Assist for initiation and management of patient positioning to maintain posterior hip precautions and TDWB on L lower extremity and non-weight bearing on L upper extremity.   Patient continues to demonstrate the following deficits muscle weakness and muscle joint tightness, decreased cardiorespiratoy endurance, impaired timing and sequencing, decreased coordination and decreased motor planning, decreased visual perceptual skills, decreased motor planning, decreased initiation, decreased attention, decreased awareness, decreased problem solving, decreased safety awareness, decreased memory and delayed processing and decreased sitting balance, decreased standing balance, decreased postural control, decreased balance strategies and difficulty maintaining precautions and therefore will continue to benefit from skilled PT intervention to increase functional independence with mobility.  Patient progressing toward long term goals..  Continue plan of care.  PT Short Term Goals Week 1:  PT Short Term Goal 1 (Week 1): Patient will perform rolling L with mod A. PT Short Term Goal 1 - Progress (Week 1): Met PT Short Term Goal 2 (Week 1): Patient will perform supine>sit using log roll technique to the L with max A. PT Short Term Goal 2 - Progress (Week 1):  Met PT Short Term Goal 3 (Week 1): Patient will perform basic transfers with max A +2. PT Short Term Goal 3 - Progress (Week 1): Progressing toward goal PT Short Term Goal 4 (Week 1): Patient will tolerate sitting OOB >30 min 2/7 days. PT Short Term Goal 4 - Progress (Week 1): Met Week 2:  PT Short Term Goal 1 (Week 2): Patient will perform bed mobility with mod-max A of 1 person consistently. PT Short Term Goal 2 (Week 2): Patient will perform standing balance >1 min while maintaining weight bearing precautions with max A +2. PT Short Term Goal 3 (Week 2): Patient will perform basic transfers with max A +1 consistently. PT Short Term Goal 4 (Week 2): Patient will initiate manual w/c mobility.  Skilled Therapeutic Interventions/Progress Updates:     Patient in bed asking to walk with his mother at bedside upon PT arrival. Patient alert and agreeable to PT session. Patient indicated L hip and elbow pain during session, RN made aware. PT provided repositioning, rest breaks, and distraction as pain interventions throughout session.   Patient with improved arousal and initiation with all mobility this session. Focused session on bed mobility and sitting balance to assess weekly progress towards goals. Educated patient on benefits of repetition to improve independence with mobility and compliance with precautions with mobility. Patient stated understanding. Patient stood x1 spontaneously from the bed with min A, patient not compliant with weight bearing precautions and not responsive to cues to return to sitting. PT facilitated at patient's hips to initiate sitting with min-mod A for safety. Educated patient on present injuries, precautions, and situation for improved orientation and understanding. Patient receptive to this and did not attempt any further spontaneous  mobility. Patient restless when sitting EOB, pulling  at waist belt restraint on the bed, redirected by having patient show therapist what  different controls do on his [employer] controller when playing [employer].   Therapeutic Activity: Bed Mobility: Patient performed rolling L x3 with min A following cues and hand-over-hand assist for R hand placement on rail, pushing through R foot to assist with roll, and maintaining a pillow between his knees for hip precautions. He performed rolling R with mod A with the same cues, except only bringing his L arm across and not pulling to this side due to precautions. He performed supine to/from sit with min-mod A x3. Provided verbal cues for log roll technique to the R, as above, setting R elbow and pushing up to his elbow then his hand to come to sitting EOB with PT supporting L foot to maintain appropriate positioning at the hip throughout transition to sitting or lying. Patient with poor trunk control on first trial when returning to bed, more collapsing toward the pillow, improved with cues for technique on subsequent trials.  Neuromuscular Re-ed: Patient performed the following activities: -Sitting balance EOB >6-7 min x2 with bed slightly elevated to accommodate L hip angle to <90 degrees, patient progressed from min-mod A for sitting balance to CGA-close supervision with cues for erect posture, visual target for reaching forward to neutral to reduce posterior bias. Donned pants while maintaining sitting balance EOB with his mother threading lower extremities through, then patient automatically reached for his pants to pull them up to his thighs. Patient then lifted each foot with min A on the L and delayed initiation, as his mother donned his socks, required increased assist for sitting balance with knee extension due to posterior bias. Patient also tolerated gentle PROM of L upper extremity in sitting, continuing to note hard, painful end feel with elbow flexion to ~90 degrees.  -bridging x2 to pull up pants over hips, first trial patient automatically pushed through his L foot to bridge,  second trial PT held his L leg straight and off the bed and patient was able to clear his hips   Patient required increased time with all mobility due to slow intiation and decreased attention. Able to redirect patient to tasks throughout session.  Patient in bed at end of session with breaks locked, waist belt and bed alarm set, and all needs within reach. Noted waist belt alarm was too large for patient, mother reports that the one he had yesterday was soiled with BM. RN and charge nurse notified and charge nurse able to locate smaller waist belt alarm for patient's safety.   Therapy Documentation Precautions:  Precautions Precautions: Fall,Posterior Hip Precaution Booklet Issued: No Precaution Comments: PEG, posterior hip precautions Restrictions Weight Bearing Restrictions: Yes RUE Weight Bearing: Weight bearing as tolerated LUE Weight Bearing: Non weight bearing LLE Weight Bearing: Touchdown weight bearing   Therapy/Group: Individual Therapy  Caelin Rayl L Juma Oxley PT, DPT  05/05/2020, 8:52 PM
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
## 2020-05-05 NOTE — Progress Notes (Signed)
Received a call from Shanda Bumps, reporting Mr. Musa is agitated and combative, he received ativan 1 mg IM at 18:15 pm. Dr Riley Kill note was reviewed. Spoke with Dr Allena Katz, he agrees with giving another dose of Ativan 1mg  IM. Placed a call to [place] awaiting a return call.

## 2020-05-05 NOTE — Patient Care Conference (Signed)
Inpatient RehabilitationTeam Conference and Plan of Care Update Date: 05/05/2020   Time: 10:10 AM    Patient Name: Jeremy George.      Medical Record Number: [id number]  Date of Birth: 10/08/1995 Sex: Male         Room/Bed: 4W16C/4W16C-01 Payor Info: Payor: MEDICAID POTENTIAL / Plan: MEDICAID POTENTIAL / Product Type: *No Product type* /    Admit Date/Time:  04/27/2020  4:59 PM  Primary Diagnosis:  TBI (traumatic brain injury) [facility])  Hospital Problems: Principal Problem:   TBI (traumatic brain injury) (HCC) Active Problems:   Protein-calorie malnutrition, severe   Sinus tachycardia   Postoperative pain   Thrombocytosis    Expected Discharge Date: Expected Discharge Date: 06/02/20  Team Members Present: Physician leading conference: Dr. Faith Rogue Care Coodinator Present: Cecile Sheerer, LCSWA;Stacey Marlyne Beards, RN, BSN, CRRN Nurse Present: Other (comment) Freddrick March, RN) PT Present: Serina Cowper, PT OT Present: Jake Shark, OT SLP Present: Feliberto Gottron, SLP PPS Coordinator present : Fae Pippin, SLP     Current Status/Progress Goal Weekly Team Focus  Bowel/Bladder   incontinent B/B LBM 3/13  Pt will become continent of B/B during stay  q2 toileting, prn meds for constipation.   Swallow/Nutrition/ Hydration   Dys. 1 textures with nectar-thick liquids, Min-Mod A, water protocol  Mod I  repeat MBS to asses tolerance of thin   ADL's   Very lethargic, improvements in transfers and participation in ADLs, max-total A for ADLs. L elbow unable to be ranged past 85 degrees and very painful  min A- CGA overall  Attention, initiation of ADLs, cognitive retraining   Mobility   Max-total A +2 overall, limited by lethargy and poor adherence to precautions  min A overall, supervision w/c propulsion 50 ft  Arousal, initiation, motor planning, strength/ROM, sitting/standing balance, functional mobility, initiation of manual w/c propulsion as able,  patient/caregiver education   Communication   Mod-Max A  Supervision  use of speech intelligibility strategies at word and phrase level   Safety/Cognition/ Behavioral Observations  Max A  Min A  sustained attention, awareness   Pain   Pt shows no signs of pain.  Pt Pain level will remain <3 out of 10  assess pain per shift, prn pain meds   Skin   multiple abrasion and incision sites  Pt will not develop new skin breakdown  assess per shift and prn. Educate family on signs of pain through body movement.     Discharge Planning:  To be assessed. Pt is Uninsured. Per EMR, pt to discharge to home with his mother who will provide 24/7 care. Assistance from girlfriend as well.   Team Discussion: Decreasing medications to decrease sedation. Mouth wired until 3/28, pain to left side, increased Ritalin to improve attention. Patient agrees he is on too much medication, had a BM today, continent B/B, skin is okay. Patient is uninsured, to discharge home with mom, there is a family history of mental health issues.  Patient on target to meet rehab goals: Functional transfers have improved, max assist to squat pivot, showered at min assist for upper body, min assist to contact guard assist goals. Max to total assist due to precautions, ROM in hips can get to 90 degrees but at 80 degrees gets painful. Needs pillow between legs when rolling to prevent shifting. MBS scheduled for tomorrow , speech is mildly improving.  *See Care Plan and progress notes for long and short-term goals.   Revisions to Treatment Plan:  Decreasing pain  and sedating medications, increased Ritalin.  Teaching Needs: Family education, medication management, pain management, skin/wound care, transfer training, gait training, balance training, endurance training, ROM training, safety awareness training.  Current Barriers to Discharge: Inaccessible home environment, Decreased caregiver support, Medical stability, Home enviroment  access/layout, Wound care, Lack of/limited family support, Weight bearing restrictions, Medication compliance, Behavior and Nutritional means  Possible Resolutions to Barriers: Continue current medications, offer nutritional support, provide emotional support.     Medical Summary Current Status: improving behavior. still somnolent at times. pain in his LLE can be a problem but pain med makes him groggy. better insight and awareness  Barriers to Discharge: Behavior;Medical stability   Possible Resolutions to [name]: daily adjustment of meds/optimize sleep and activation, pain control, nutritional assessment   Continued Need for Acute Rehabilitation Level of Care: The patient requires daily medical management by a physician with specialized training in physical medicine and rehabilitation for the following reasons: Direction of a multidisciplinary physical rehabilitation program to maximize functional independence : Yes Medical management of patient stability for increased activity during participation in an intensive rehabilitation regime.: Yes Analysis of laboratory values and/or radiology reports with any subsequent need for medication adjustment and/or medical intervention. : Yes   I attest that I was present, lead the team conference, and concur with the assessment and plan of the team.   Tennis Must 05/05/2020, 2:25 PM

## 2020-05-05 NOTE — Progress Notes (Addendum)
Patient ID: Jeremy Palms., male   DOB: Aug 10, 1995, 25 y.o.   MRN: [id number]  SW met with pt and pt mother to provide updates from team conference, and d/c date 4/12. Pt mother discussed concerns related to pt mental health since he has reported hearing voices. SW asked pt,and he admits to hearing voices, and the voices are telling him to "shutup." SW informed will relay to medical team, and will continue to check in on patient while he is here. Sw also informed pt will be seen by neuropsych when more appropriate. SW provided pt mother letter of incompetence. SW will continue to provide updates.   Loralee Pacas, MSW, [place] Office: [phone] Cell: [phone] Fax: [phone]

## 2020-05-05 NOTE — Progress Notes (Signed)
Speech Language Pathology Daily Session Note  Patient Details  Name: Jeremy George. MRN: [id number] Date of Birth: 1996/01/05  Today's Date: 05/05/2020 SLP Individual Time: 0725-0820 SLP Individual Time Calculation (min): 55 min  Short Term Goals: Week 1: SLP Short Term Goal 1 (Week 1): Patient will demonstrate sustained attention to functional tasks for 5 mintues with Mod verbal cues for redirection. SLP Short Term Goal 2 (Week 1): Patient will utilize speech intelligibility strategies at the phrase level to achieve ~50% intelligibility with Max A multimodal cues. SLP Short Term Goal 3 (Week 1): Patient will utilize external aids for orientation X 4 with Mod A multimodal cues. SLP Short Term Goal 4 (Week 1): Patient will consume current diet with minimal overt s/s of aspiration with Mod verbal cues for use of swallowing compensatory strategies. SLP Short Term Goal 5 (Week 1): Patient will consume trials of thin liquids with minimal overt s/s of aspiration over 2 sessions to asses readiness for repeat MBS. SLP Short Term Goal 6 (Week 1): Patient will demonstrate efficient mastication and complete oral clearance with trials of Dys. 2 textures without overt s/s of aspiration and Min verbal cues over 2 sessions prior to upgrade.  Skilled Therapeutic Interventions: Skilled treatment session focused on dysphagia and speech goals. SLP facilitated session by providing patient with nectar-thick liquids via straw (without patient knowing its thickened). Patient consumed 16 oz without overt s/s of aspiration. SLP attempted varieties of pureed textures (pureed cereal, etc) with minimal PO intake. Suspect patient's overall cognitive functioning and suspected lack of appetite (tube feeds) are contributing to minimal PO intake. Recommend MBS tomorrow to assess swallow function and possible upgrade. Patient appeared more awake today which had a positive impact on his overall speech intelligibility at the  phrase level. Patient also much more verbally engaged with clinician today. Patient left upright in bed with alarm on and all needs within reach. Continue with current plan of care.   Pain Intermittent pain, patient premedicated and repositioned   Therapy/Group: Individual Therapy  Leyana Whidden 05/05/2020, 10:23 AM

## 2020-05-05 NOTE — Progress Notes (Signed)
RN attempted to give medicine. Pt stated, I dont want any drugs just liquid." RN educated pt on what medicine that was being given. Pt refused profusely. Pt did allow RN to give valproic acid. Pt stable at this time. RN will continue to monitor.
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
## 2020-05-05 NOTE — Progress Notes (Signed)
Occupational Therapy Weekly Progress Note  Patient Details  Name: Jeremy George. MRN: [id number] Date of Birth: [birth year]  Beginning of progress report period: April 28, 2020 End of progress report period: May 05, 2020  Today's Date: 05/05/2020 OT Individual Time: 0850-1000 OT Individual Time Calculation (min): 70 min   Patient has met 3 of 3 short term goals. Pt has made steady progress this reporting period. He is still very limited by lethargy but when alert, he has improved in his attention to task, ADL, and transfers. He is still presenting at a RLAS V, with some behaviors consistent with a IV at nighttime and when in pain. When alert, he is oriented x3. Pt's L elbow is very limited in elbow flexion and is unable to be passively ranged past 85 degrees, while causing pt significant pain. Pt continues to benefit from CIR. His mother and father continue to be present and supportive.   Patient continues to demonstrate the following deficits: muscle weakness and muscle joint tightness, decreased coordination and decreased motor planning, decreased initiation, decreased attention, decreased awareness, decreased problem solving, decreased safety awareness, decreased memory and delayed processing and decreased sitting balance, decreased standing balance, decreased postural control, decreased balance strategies and difficulty maintaining precautions and therefore will continue to benefit from skilled OT intervention to enhance overall performance with BADL and Reduce care partner burden.  Patient progressing toward long term goals..  Continue plan of care.  OT Short Term Goals Week 1:  OT Short Term Goal 1 (Week 1): Pt will sit EOB with max A during ADLs OT Short Term Goal 1 - Progress (Week 1): Met OT Short Term Goal 2 (Week 1): Pt will attend to oral care task with no more than mod cueing OT Short Term Goal 2 - Progress (Week 1): Met OT Short Term Goal 3 (Week 1): Pt will wash UB with  max A OT Short Term Goal 3 - Progress (Week 1): Met Week 2:  OT Short Term Goal 1 (Week 2): Pt will don shirt with mod A OT Short Term Goal 2 (Week 2): Pt will attend to UB bathing task with no more than mod cueing OT Short Term Goal 3 (Week 2): Pt will maintain arousal during one ADL task with no more than mod cueing  Skilled Therapeutic Interventions/Progress Updates:    Pt received supine with nursing staff present, assisting with bedpan use. Education provided to nursing staff re need to protect L hip posterior precautions. Pt very eager to shower. Pt completed bed mobility to EOB with max +2 assist. He completed slideboard transfer to TIS shower chair with max A, +2 stabilizing chair. Pt completed UB bathing in the shower with only min A. LB bathing with mod A. Pt required max cueing for arousal with pt closing his eyes and becoming minimally responsive. BP 145/101. Pt fluctuates with arousal throughout shower. Pt began trying to pushing himself up from the shower chair to reposition, with heavy cueing/assist to reduce LUE weightbearing. LUE painful with his weightbearing but pt still unable to process NWB cueing. Shower chair was quickly brought to EOB, as pt was becoming more agitated and wanting to get out of chair. He demonstrated restlessness/agitation with increased pain. Squat pivot with max A back to bed. Pt completed rolling R and L with mod A to don new brief. Shirt donned sitting up in bed with mod A. Pt completed oral care with min A for thoroughness. Pt was left supine with all needs met,  bed alarm and posey waist belt on. Mother present.   Therapy Documentation Precautions:  Precautions Precautions: Fall,Posterior Hip Precaution Booklet Issued: No Precaution Comments: PEG, posterior hip precautions Restrictions Weight Bearing Restrictions: Yes RUE Weight Bearing: Weight bearing as tolerated LUE Weight Bearing: Non weight bearing LLE Weight Bearing: Touchdown weight  bearing   Therapy/Group: Individual Therapy  Curtis Sites 05/05/2020, 6:26 AM

## 2020-05-06 ENCOUNTER — Inpatient Hospital Stay (HOSPITAL_COMMUNITY): Payer: Medicaid Other

## 2020-05-06 MED ORDER — OXYCODONE HCL 5 MG PO TABS
2.5000 mg | ORAL_TABLET | Freq: Four times a day (QID) | ORAL | Status: DC | PRN
  Administered 2020-05-07 – 2020-05-15 (×15): 2.5 mg
  Filled 2020-05-06 (×18): qty 1

## 2020-05-06 MED ORDER — PROPRANOLOL HCL 20 MG PO TABS
60.0000 mg | ORAL_TABLET | Freq: Four times a day (QID) | ORAL | Status: AC
  Administered 2020-05-06 – 2020-06-01 (×103): 60 mg via ORAL
  Filled 2020-05-06 (×27): qty 3
  Filled 2020-05-06: qty 6
  Filled 2020-05-06 (×52): qty 3
  Filled 2020-05-06: qty 6
  Filled 2020-05-06 (×24): qty 3
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
## 2020-05-06 NOTE — Progress Notes (Signed)
At 1945pm Pt became very agitated/ combative. Pt got out of restraints attempting to get out of bed. Staff got pt back in restraints. Pt cursing, kicking, spitting at staff. Medications administered. Jeremy Lefevre, NP notified by charge nurse of situation. Orders given to give 1mg  of ativan if needed. Ativan not used due to pt calming down. Charge nurse notified family. Pt stable at this time. RN will continue to monitor.

## 2020-05-06 NOTE — Progress Notes (Signed)
Physical Therapy Session Note  Patient Details  Name: Jeremy George. MRN: [id number] Date of Birth: Sep 08, 1995  Today's Date: 05/06/2020 PT Missed Time: 30 Minutes Missed Time Reason: Patient unwilling to participate  Short Term Goals: Week 2:  PT Short Term Goal 1 (Week 2): Patient will perform bed mobility with mod-max A of 1 person consistently. PT Short Term Goal 2 (Week 2): Patient will perform standing balance >1 min while maintaining weight bearing precautions with max A +2. PT Short Term Goal 3 (Week 2): Patient will perform basic transfers with max A +1 consistently. PT Short Term Goal 4 (Week 2): Patient will initiate manual w/c mobility.  Skilled Therapeutic Interventions/Progress Updates:     Pt received supine in bed. Pt becomes agitated when PT attempts to take posey belt off for therapy. PT redirects and tries multiple strategies to initiate therapy, but pt continues to be agitated and refusing therapy. PT will follow up as appropriate.  Therapy Documentation Precautions:  Precautions Precautions: Fall,Posterior Hip Precaution Booklet Issued: No Precaution Comments: PEG, posterior hip precautions Restrictions Weight Bearing Restrictions: Yes RUE Weight Bearing: Weight bearing as tolerated LUE Weight Bearing: Non weight bearing LLE Weight Bearing: Touchdown weight bearing General: PT Amount of Missed Time (min): 30 Minutes PT Missed Treatment Reason: Patient unwilling to participate  Therapy/Group: Individual Therapy  Beau Fanny, PT, DPT 05/06/2020, 3:45 PM

## 2020-05-06 NOTE — Progress Notes (Signed)
Physical Therapy Session Note  Patient Details  Name: Jeremy George. MRN: [id number] Date of Birth: 1995/05/29  Today's Date: 05/06/2020 PT Individual Time: 0800-0900 PT Individual Time Calculation (min): 60 min   Short Term Goals: Week 1:  PT Short Term Goal 1 (Week 1): Patient will perform rolling L with mod A. PT Short Term Goal 1 - Progress (Week 1): Met PT Short Term Goal 2 (Week 1): Patient will perform supine>sit using log roll technique to the L with max A. PT Short Term Goal 2 - Progress (Week 1): Met PT Short Term Goal 3 (Week 1): Patient will perform basic transfers with max A +2. PT Short Term Goal 3 - Progress (Week 1): Progressing toward goal PT Short Term Goal 4 (Week 1): Patient will tolerate sitting OOB >30 min 2/7 days. PT Short Term Goal 4 - Progress (Week 1): Met Week 2:  PT Short Term Goal 1 (Week 2): Patient will perform bed mobility with mod-max A of 1 person consistently. PT Short Term Goal 2 (Week 2): Patient will perform standing balance >1 min while maintaining weight bearing precautions with max A +2. PT Short Term Goal 3 (Week 2): Patient will perform basic transfers with max A +1 consistently. PT Short Term Goal 4 (Week 2): Patient will initiate manual w/c mobility. Week 3:     Skilled Therapeutic Interventions/Progress Updates:    Pain:  Pt reports R "butt" pain.  Educated pt on hip injury/precautions/origin of pain. Treatment to tolerance.  Rest breaks and repositioning as needed. Therapist removed brief., pt attempts to partial bridge, max cues to prevent wbing LLE. Pt washed perineal area w/set up and cues.  Pt lifts feet, additional time and limited ROM LLE to thread feet for Donning brief/shorts.  Pt attempts to raise using RUE, therapist assists on L to prevent pt from pulling w/LUE. Supine to side to sit w/max cues and assist to maintain precautions.  Sits w/downward gaze but can raise head to cues. Mod assist for balance.  Mom donns shoes  for time efficiency.  Pt dons shirt w/set up and max assist. Squat pvt max, max cues for wbing and precautions LUE/LLE. Pt tilted and seatbelt fastened for safety.  Pt transported to gym.  Sit to stand from wc via 3 muskateer assist, therapist elevates LLE to prevent wbing.   Gait 11f x 2 as above, improves w/wbing adherence w/repetition, improved ability to [facility]self via shoulder depression on therapists shoulders for advancement of RLE via "push and sway" while maintaining NWBing.  Pt rested in sitting approx 5 min between efforts. Pt transported back to room, squat pivot to bed as above in prep for MBS scheduled following our session.  Sit to supine w/max assist.  Pt left supine w/rails up x 4, alarm set, bed in lowest position, and needs in reach. NT in room w/pt.     Therapy Documentation Precautions:  Precautions Precautions: Fall,Posterior Hip Precaution Booklet Issued: No Precaution Comments: PEG, posterior hip precautions Restrictions Weight Bearing Restrictions: Yes RUE Weight Bearing: Weight bearing as tolerated LUE Weight Bearing: Non weight bearing LLE Weight Bearing: Touchdown weight bearing   Therapy/Group: Individual Therapy  BCallie Fielding PChamberlayne3/16/2022, 12:59 PM
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
## 2020-05-06 NOTE — Progress Notes (Signed)
Modified Barium Swallow Progress Note  Patient Details  Name: Jeremy George. MRN: [id number] Date of Birth: 01-Feb-1996  Today's Date: 05/06/2020  Modified Barium Swallow completed.  Full report located under Chart Review in the Imaging Section.  Brief recommendations include the following:  Clinical Impression  Patient continues to demonstrate a moderate-severe oral phase and mild pharyngeal phase dysphagia. Oral deficits are impacted by bilateral facial fractures as well was decreased oral ROM/strength. Poor awareness of bolus along with prolonged AP transit and poor oral manipulation result in piecemeal swallowing with oral residue of pureed textures. Patient consistently triggered his swallow at the pyriform sinuses with thin liquids. Patient initially demonstrated deep laryngeal penetration that cleared with cued cough. However, as boluses continued, patient's overall swallow efficiency improved resulting in only flash penetration with large, sequential sips of thin liquids via straw. Mild valleculae residue noted across all consistencies.  Recommend patient continue Dys. 1 textures but upgrade to thin liquids. Educated patient and his mother regarding results and recommendations.   Swallow Evaluation Recommendations       SLP Diet Recommendations: Dysphagia 1 (Puree) solids;Thin liquid   Liquid Administration via: Straw   Medication Administration: Crushed with puree (or via PEG)   Supervision: Patient able to self feed;Full supervision/cueing for compensatory strategies;Staff to assist with self feeding   Compensations: Minimize environmental distractions;Slow rate;Small sips/bites;Follow solids with liquid   Postural Changes: Seated upright at 90 degrees   Oral Care Recommendations: Oral care BID        Jeremy George 05/06/2020,3:02 PM

## 2020-05-06 NOTE — Progress Notes (Signed)
Speech Language Pathology Weekly Progress Note  Patient Details  Name: Savaughn Karwowski. MRN: [id number] Date of Birth: [birth year]  Beginning of progress report period: April 27, 2020 End of progress report period: May 06, 2020  Short Term Goals: Week 1: SLP Short Term Goal 1 (Week 1): Patient will demonstrate sustained attention to functional tasks for 5 mintues with Mod verbal cues for redirection. SLP Short Term Goal 1 - Progress (Week 1): Met SLP Short Term Goal 2 (Week 1): Patient will utilize speech intelligibility strategies at the phrase level to achieve ~50% intelligibility with Max A multimodal cues. SLP Short Term Goal 2 - Progress (Week 1): Met SLP Short Term Goal 3 (Week 1): Patient will utilize external aids for orientation X 4 with Mod A multimodal cues. SLP Short Term Goal 3 - Progress (Week 1): Met SLP Short Term Goal 4 (Week 1): Patient will consume current diet with minimal overt s/s of aspiration with Mod verbal cues for use of swallowing compensatory strategies. SLP Short Term Goal 4 - Progress (Week 1): Met SLP Short Term Goal 5 (Week 1): Patient will consume trials of thin liquids with minimal overt s/s of aspiration over 2 sessions to asses readiness for repeat MBS. SLP Short Term Goal 5 - Progress (Week 1): Met SLP Short Term Goal 6 (Week 1): Patient will demonstrate efficient mastication and complete oral clearance with trials of Dys. 2 textures without overt s/s of aspiration and Min verbal cues over 2 sessions prior to upgrade. SLP Short Term Goal 6 - Progress (Week 1): Not met    New Short Term Goals: Week 2: SLP Short Term Goal 1 (Week 2): Patient will utilize speech intelligibility strategies at the phrase level to achieve ~80% intelligibility with Max A multimodal cues. SLP Short Term Goal 2 (Week 2): Patient will demonstrate sustained attention to functional tasks for 10 mintues with Mod verbal cues for redirection. SLP Short Term Goal 3 (Week 2):  Patient will utilize external aids for orientation X 4 with Min A multimodal cues. SLP Short Term Goal 4 (Week 2): Patient will consume current diet of Dys. 1 textures with thin liquids wiht minimal overt s/s of aspiration and overall Min A verbal cues for use of swallow strategies. SLP Short Term Goal 5 (Week 2): Patient will demonstrate efficient mastication and complete oral clearance with trials of Dys. 2 textures without overt s/s of aspiration and Min verbal cues over 2 sessions prior to upgrade.  Weekly Progress Updates: Patient has made functional and inconsistent gains and has met 5 of 6 STGs this reporting period. Currently, patient is demonstrating behaviors consistent with a Rancho Level V and requires overall Max A multimodal cues for sustained attention, intellectual awareness, and orientation. Patient can become easily confused at times which results in increased restlessness, agitation and sometimes combativeness. Patient also requires Mod-Max A verbal cues for use of speech intelligibility strategies to maximize intelligibility to 50-75% at the phrase level. Patient had a repeat MBS today which showed intermittent penetration of thin liquids with large, sequential sips via straw. Recommend patient upgrade to thin liquids but continue Dys. 1 textures due to poor awareness of bolus with minimal AP transit and manipulation. Patient and family education ongoing. Patient would benefit from continued skilled SLP intervention to maximize his cognitive, speech and swallowing function prior to discharge.      Intensity: Minumum of 1-2 x/day, 30 to 90 minutes Frequency: 3 to 5 out of 7 days Duration/Length of Stay: 06/02/20  Treatment/Interventions: Dysphagia/aspiration precaution training;Therapeutic Activities;Environmental controls;Cueing hierarchy;Functional tasks;Patient/family education;Cognitive remediation/compensation;Speech/Language facilitation;Internal/external aids     Tahisha Hakim,  Haik Mahoney 05/06/2020, 6:29 AM

## 2020-05-06 NOTE — Progress Notes (Signed)
Occupational Therapy Session Note  Patient Details  Name: Jeremy George. MRN: [id number] Date of Birth: [birth year]  Today's Date: 05/06/2020 OT Individual Time: 1330-1430 OT Individual Time Calculation (min): 60 min    Short Term Goals: Week 2:  OT Short Term Goal 1 (Week 2): Pt will don shirt with mod A OT Short Term Goal 2 (Week 2): Pt will attend to UB bathing task with no more than mod cueing OT Short Term Goal 3 (Week 2): Pt will maintain arousal during one ADL task with no more than mod cueing  Skilled Therapeutic Interventions/Progress Updates:    Pt supine with no c/o pain. Pt very tearful and tangential in speech, 50% intelligible. Attempted to initiate ADLs and OOB activity but pt unable to be redirected from tearfulness and he expressed wanting to talk with OT, stating "I like talking to you". Remainder of session focused on self-regulation, insight to deficits, psychosocial adjustment/mood, TBI education, and mental health hx with OT using motivational interviewing techniques to promote increased self-awareness. Pt did express some thought of self harm (no plan) and this was relayed to MD and PA. Pt was able to brush teeth and wash face at bed level with set up assist and min cueing for attention to task. Pt was left supine with the posey waist belt activated, bed alarm set.   Therapy Documentation Precautions:  Precautions Precautions: Fall,Posterior Hip Precaution Booklet Issued: No Precaution Comments: PEG, posterior hip precautions Restrictions Weight Bearing Restrictions: Yes RUE Weight Bearing: Weight bearing as tolerated LUE Weight Bearing: Non weight bearing LLE Weight Bearing: Touchdown weight bearing   Therapy/Group: Individual Therapy  Crissie Reese 05/06/2020, 6:40 AM

## 2020-05-06 NOTE — Progress Notes (Signed)
Patient ID: Jeremy George., male   DOB: March 02, 1995, 25 y.o.   MRN: [id number]  SW made efforts to meet with pt, but pt sleep in room. SW discussed with pt mother if she has heard anything from [facility] reviewers. No follow-up yet.   SW read [facility] acct, waiting on incompetency letter. SW emailed competency letter to [employer].  Cecile Sheerer, MSW, LCSWA Office: [phone] Cell: [phone] Fax: [phone]

## 2020-05-07 DIAGNOSIS — R1312 Dysphagia, oropharyngeal phase: Secondary | ICD-10-CM

## 2020-05-07 MED ORDER — ENSURE ENLIVE PO LIQD
237.0000 mL | Freq: Three times a day (TID) | ORAL | Status: DC
  Administered 2020-05-07 – 2020-05-17 (×18): 237 mL via ORAL

## 2020-05-07 MED ORDER — NICOTINE 21 MG/24HR TD PT24
21.0000 mg | MEDICATED_PATCH | Freq: Every day | TRANSDERMAL | Status: DC
  Administered 2020-05-07 – 2020-05-26 (×7): 21 mg via TRANSDERMAL
  Filled 2020-05-07 (×16): qty 1

## 2020-05-07 NOTE — Progress Notes (Signed)
Pt asking for his [place] cigarettes. Mother requesting nicotine patch. Called PA and new order received.

## 2020-05-07 NOTE — Progress Notes (Signed)
Occupational Therapy Session Note  Patient Details  Name: Jeremy George. MRN: [id number] Date of Birth: 1995-08-20  Today's Date: 05/07/2020 OT Individual Time: 1345-1500 OT Individual Time Calculation (min): 75 min    Short Term Goals: Week 1:  OT Short Term Goal 1 (Week 1): Pt will sit EOB with max A during ADLs OT Short Term Goal 1 - Progress (Week 1): Met OT Short Term Goal 2 (Week 1): Pt will attend to oral care task with no more than mod cueing OT Short Term Goal 2 - Progress (Week 1): Met OT Short Term Goal 3 (Week 1): Pt will wash UB with max A OT Short Term Goal 3 - Progress (Week 1): Met  Skilled Therapeutic Interventions/Progress Updates:    1:1. Pt received in bed agreeable to OT. Pt seated EOB with S. RN and mom in room. Pt declines bathing and dressing. Pt completes stand pivot transfer with MOD A with OT putting LUE over shoulder and OT keeping LLE off floor +2 guarding. Pt plays checkers game seated in w/c maintaining attention to game and rules 75% of time for 25 min. Intermittent cuing to only play with RUE to protect L elbow. Pt plays 3 games of connect 4 with intermittent cuing to not sit past 90 and only use RUE. Pt perseverative on "smoking" but redirectable to game. Pt uses BITS for working memory but distracted by picking skin on LLE. Bandage applied to prevent pt from opening wound. Pt able to recall serial words up to 4 in a row when attentive. Exited session with pt seated in TIS at RN station for supervision as pt declines getting back in bed, exit alarm on and call light in reach   Therapy Documentation Precautions:  Precautions Precautions: Fall,Posterior Hip Precaution Booklet Issued: No Precaution Comments: PEG, posterior hip precautions Restrictions Weight Bearing Restrictions: Yes RUE Weight Bearing: Weight bearing as tolerated LUE Weight Bearing: Non weight bearing LLE Weight Bearing: Touchdown weight bearing General:   Vital  Signs: Therapy Vitals Temp: 98.3 F (36.8 C) Pulse Rate: (!) 102 Resp: 14 BP: 107/60 Patient Position (if appropriate): Lying Oxygen Therapy SpO2: 100 % O2 Device: Room Air Pain: Pain Assessment Pain Scale: 0-10 Pain Score: Asleep Pain Type: Surgical pain Pain Location: Generalized Pain Orientation: Left Pain Descriptors / Indicators: Sharp;Throbbing Pain Frequency: Constant Pain Onset: On-going Patients Stated Pain Goal: 2 Pain Intervention(s): Medication (See eMAR) ADL: ADL Eating: Dependent Where Assessed-Eating: Edge of bed Grooming: Maximal assistance Where Assessed-Grooming: Edge of bed Upper Body Bathing: Unable to assess Lower Body Bathing: Unable to assess Upper Body Dressing: Unable to assess Lower Body Dressing: Unable to assess Toileting: Unable to assess Toilet Transfer: Unable to assess Tub/Shower Transfer: Unable to assess Gaffer Transfer: Unable to assess Social research officer, government Method: Unable to assess Vision   Perception    Praxis   Exercises:   Other Treatments:     Therapy/Group: Individual Therapy  Tonny Branch 05/07/2020, 6:54 AM
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
## 2020-05-07 NOTE — Progress Notes (Signed)
Physical Therapy Session Note  Patient Details  Name: Jeremy George. MRN: [id number] Date of Birth: 06-28-1995  Today's Date: 05/07/2020 PT Individual Time: 0910-1005 PT Individual Time Calculation (min): 55 min   Short Term Goals: Week 2:  PT Short Term Goal 1 (Week 2): Patient will perform bed mobility with mod-max A of 1 person consistently. PT Short Term Goal 2 (Week 2): Patient will perform standing balance >1 min while maintaining weight bearing precautions with max A +2. PT Short Term Goal 3 (Week 2): Patient will perform basic transfers with max A +1 consistently. PT Short Term Goal 4 (Week 2): Patient will initiate manual w/c mobility.  Skilled Therapeutic Interventions/Progress Updates:    Pt received supine in bed with his mother present and pt agreeable to therapy session, requesting to get OOB. Pt with mumbled speech and difficult to understand throughout session. Pt impulsively attempts to pull himself into long sitting using bedrails 2x - education with cuing to reinforce L UE WBing restrictions, pt requires repeated cuing to wait for therapist to set-up equipment prior to initiating mobility. In supine, donned socks and tennis shoes max assist with pt having poor sustained attention on task but instead perseverating on calling his significant other - pt's mother and therapist providing cuing for redirection to increase participation with therapy. Pt able to recall L LE TDWBing but unable to implement it during functional mobility tasks - does not recall posterior hip precautions. Supine>sitting R EOB max assist for B LE management and trunk upright with pt having difficulty motor planning and sequencing how to perform mobility tasks while maintaining precautions. Initiated R squat/lateral scoot transfer but pt again unable to motor plan this task while maintaining WBing precautions therefore transitioned to partial stand pivot using L UE support around therapist's shoulders and  RUE support on w/c with max assist for lifting and pivoting hips.  Transported to/from gym in w/c for time management and energy conservation. Sit<>stands to/from w/c and L UE support around therapist's shoulders 2x with +2 mod assist for lifting - therapist lifts L LE off the floor during transfer to maintain TDWBing as pt with poor compliance and ability to motor plan this.  Standing with B UE support via 3 Musketeer +2 min assist while performing  1st stand: x8 reps L LE hip flexion through limited ROM and x10 reps hamstring curls 2nd stand: progressed to only L UE support around therapist's shoulders with mod assist of 1 while performing R UE ball toss to/from 2nd person assist x5 reps  Pt demonstrating significant lethargy at this time and starting to fall asleep. Assessed vitals: BP 125/83 (MAP 95), HR 101bpm  Transported to other therapy gym for change of scenery to increase stimulation. Seated in TIS w/c participated in B UE mobility task to shoot ball into basketball goal - pt with poor frustration tolerance and likely upset at the weakness in his UEs as he couldn't get ball to the hoop - therapist moved goal closer to allow pt to "dunk" 1x and then had to redirect to another task to avoid increased frustration. Seated in w/c initiated 1-2step command task to grasp and then trade clothespins - pt perseverating on picking a scab on his L leg. Transported back to room. R partial standing stand pivot transfer w/c>EOB with max assist of 1 with L UE support around therapist's shoulders and therapist assisting with holding L LE to maintain WBing precautions. Sit>supine with +2 max assist for trunk descent and B LE  management. Pt left supine in bed with needs in reach, posey belt on, pt's mother present, and Clydie Braun, RN aware of pt's position.   Therapy Documentation Precautions:  Precautions Precautions: Fall,Posterior Hip Precaution Booklet Issued: No Precaution Comments: PEG, posterior hip  precautions Restrictions Weight Bearing Restrictions: Yes RUE Weight Bearing: Weight bearing as tolerated LUE Weight Bearing: Non weight bearing LLE Weight Bearing: Touchdown weight bearing  Pain: Reports L ankle pain while sitting in w/c - repositioned elevating leg rest for pain management with improvement otherwise no complaints of pain.   Therapy/Group: Individual Therapy  Ginny Forth , PT, DPT, CSRS  05/07/2020, 7:55 AM
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
## 2020-05-07 NOTE — Progress Notes (Signed)
Speech Language Pathology Daily Session Note  Patient Details  Name: Jeremy George. MRN: [id number] Date of Birth: 1995/12/21  Today's Date: 05/07/2020 SLP Individual Time: 0725-0810 SLP Individual Time Calculation (min): 45 min  Short Term Goals: Week 2: SLP Short Term Goal 1 (Week 2): Patient will utilize speech intelligibility strategies at the phrase level to achieve ~80% intelligibility with Max A multimodal cues. SLP Short Term Goal 2 (Week 2): Patient will demonstrate sustained attention to functional tasks for 10 mintues with Mod verbal cues for redirection. SLP Short Term Goal 3 (Week 2): Patient will utilize external aids for orientation X 4 with Min A multimodal cues. SLP Short Term Goal 4 (Week 2): Patient will consume current diet of Dys. 1 textures with thin liquids wiht minimal overt s/s of aspiration and overall Min A verbal cues for use of swallow strategies. SLP Short Term Goal 5 (Week 2): Patient will demonstrate efficient mastication and complete oral clearance with trials of Dys. 2 textures without overt s/s of aspiration and Min verbal cues over 2 sessions prior to upgrade.  Skilled Therapeutic Interventions: Skilled treatment session focused on dysphagia and cognitive goals. Upon arrival, patient was emotional while upright in the bed due to wanting to go home. Patient was perseverative on the fact that he was supposed to discharge home today. SLP was able to redirect patient with ice water. Patient consumed ~8 oz without overt s/s of aspiration. Despite encouragement, patient declined all solids. Patient's mother arrived which appeared to increase agitation secondary to her reporting she was not taking him home today. Patient became verbally frustrated and began to kick and punch the head and foot boards. The foot board was removed and patient stopped punching the head board when SLP educated patient on bilateral wrist restraints. TV and lights were turned off and  patient left resting calmly in bed. Patient left with alarm on, mother present, and waist belt in place. Continue with current plan of care.      Pain Pain Assessment Pain Scale: Faces Pain Score: 5  Faces Pain Scale: No hurt Pain Type: Surgical pain Pain Location: Hip Pain Orientation: Left Pain Radiating Towards: upper and lower left side of body Pain Descriptors / Indicators: Sharp;Throbbing Pain Frequency: Constant Pain Onset: On-going Pain Intervention(s): Medication (See eMAR)  Therapy/Group: Individual Therapy  Philo Kurtz 05/07/2020, 3:27 PM

## 2020-05-07 NOTE — Progress Notes (Signed)
Pt has not eaten meals today but will drink ensures. Will continue to encourage PO intake

## 2020-05-07 NOTE — Progress Notes (Signed)
Speech Language Pathology Daily Session Note  Patient Details  Name: Jeremy George. MRN: [id number] Date of Birth: Aug 07, 1995  Today's Date: 05/07/2020 SLP Individual Time: 1300-1326 SLP Individual Time Calculation (min): 26 min  Short Term Goals: Week 2: SLP Short Term Goal 1 (Week 2): Patient will utilize speech intelligibility strategies at the phrase level to achieve ~80% intelligibility with Max A multimodal cues. SLP Short Term Goal 2 (Week 2): Patient will demonstrate sustained attention to functional tasks for 10 mintues with Mod verbal cues for redirection. SLP Short Term Goal 3 (Week 2): Patient will utilize external aids for orientation X 4 with Min A multimodal cues. SLP Short Term Goal 4 (Week 2): Patient will consume current diet of Dys. 1 textures with thin liquids wiht minimal overt s/s of aspiration and overall Min A verbal cues for use of swallow strategies. SLP Short Term Goal 5 (Week 2): Patient will demonstrate efficient mastication and complete oral clearance with trials of Dys. 2 textures without overt s/s of aspiration and Min verbal cues over 2 sessions prior to upgrade.  Skilled Therapeutic Interventions: Pt was seen for skilled ST targeting cognitive goals. His mother was present at bedside. Upon arrival pt was somewhat physically restless, but not agitated and relatively calm. He engaged in conversation with SLP appropriately when provided Min-Mod verbal cues for redirection and topic maintenance. All conversational topics were appropriate. Pt expressed desire to brush his teeth, therefore provided set up and Min A verbal cueing for thoroughness and problem solving use of suction toothbrush. Pt with limited recall of his day and ability to discriminate between therapy disciplines, however receptive to education regarding PT/OT/ST goals. Pt left laying in bed with alarm set and abdominal restraint still in place, mother still present. Continue per current plan of  care.          Pain Pain Assessment Pain Scale: Faces Faces Pain Scale: No hurt  Therapy/Group: Individual Therapy  [name] 05/07/2020, 7:23 AM
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
## 2020-05-08 DIAGNOSIS — R4689 Other symptoms and signs involving appearance and behavior: Secondary | ICD-10-CM

## 2020-05-08 DIAGNOSIS — S069X0S Unspecified intracranial injury without loss of consciousness, sequela: Secondary | ICD-10-CM

## 2020-05-08 LAB — COMPREHENSIVE METABOLIC PANEL
ALT: 17 U/L (ref 0–44)
AST: 18 U/L (ref 15–41)
Albumin: 2.5 g/dL — ABNORMAL LOW (ref 3.5–5.0)
Alkaline Phosphatase: 196 U/L — ABNORMAL HIGH (ref 38–126)
Anion gap: 7 (ref 5–15)
BUN: 15 mg/dL (ref 6–20)
CO2: 29 mmol/L (ref 22–32)
Calcium: 9.2 mg/dL (ref 8.9–10.3)
Chloride: 96 mmol/L — ABNORMAL LOW (ref 98–111)
Creatinine, Ser: 0.77 mg/dL (ref 0.61–1.24)
GFR, Estimated: >60 mL/min (ref >60)
Glucose, Bld: 90 mg/dL (ref 70–99)
Potassium: 3.7 mmol/L (ref 3.5–5.1)
Sodium: 132 mmol/L — ABNORMAL LOW (ref 135–145)
Total Bilirubin: 0.8 mg/dL (ref 0.3–1.2)
Total Protein: 7.7 g/dL (ref 6.5–8.1)

## 2020-05-08 LAB — VALPROIC ACID LEVEL: Valproic Acid Lvl: 43 ug/mL — ABNORMAL LOW (ref 50.0–100.0)

## 2020-05-08 MED ORDER — VALPROIC ACID 250 MG/5ML PO SOLN
750.0000 mg | Freq: Three times a day (TID) | ORAL | Status: DC
  Administered 2020-05-08 – 2020-05-16 (×23): 750 mg
  Filled 2020-05-08 (×23): qty 15

## 2020-05-08 NOTE — Progress Notes (Signed)
Physical Therapy Session Note  Patient Details  Name: Jeremy George. MRN: [id number] Date of Birth: 1995/06/20  Today's Date: 05/08/2020 PT Individual Time: 2811-8867   57 min  Short Term Goals: Week 1:  PT Short Term Goal 1 (Week 1): Patient will perform rolling L with mod A. PT Short Term Goal 1 - Progress (Week 1): Met PT Short Term Goal 2 (Week 1): Patient will perform supine>sit using log roll technique to the L with max A. PT Short Term Goal 2 - Progress (Week 1): Met PT Short Term Goal 3 (Week 1): Patient will perform basic transfers with max A +2. PT Short Term Goal 3 - Progress (Week 1): Progressing toward goal PT Short Term Goal 4 (Week 1): Patient will tolerate sitting OOB >30 min 2/7 days. PT Short Term Goal 4 - Progress (Week 1): Met Week 2:  PT Short Term Goal 1 (Week 2): Patient will perform bed mobility with mod-max A of 1 person consistently. PT Short Term Goal 2 (Week 2): Patient will perform standing balance >1 min while maintaining weight bearing precautions with max A +2. PT Short Term Goal 3 (Week 2): Patient will perform basic transfers with max A +1 consistently. PT Short Term Goal 4 (Week 2): Patient will initiate manual w/c mobility. Week 3:     Skilled Therapeutic Interventions/Progress Updates:   Pt received supine in bed. Aroused with effort from mother and PT. Clothing management to don pants at bed level with mod assist and cues for WC restrictions. Stand pivot transfer to [place] wth LUE on PT and RUE on WC arm rest and min assist and cues for WC restrictions.   Gait training with 45msketeer technique to limit WB on the LLE 2 x 483fand max cues throughout to improve use of UE to limit WB. Mild adherence to instruction.   Sustained attention task to perform Basket ball toss to target with cues to decrease use of LUE to protect elbow. 2 x 6 with cues decreaesd frustration and assistance to improve success of task. Pt then performed horseshoe toss 2 x 8  wth lateral reaches to the R to obtain horse shoe.   Pt returned to room and performed stand pivot  transfer to bed with mod assist for safety. Sit>supine completed with mod assist for proper positioning. Posey belty set in place, and left supine in bed with call bell in reach and all needs met.       Therapy Documentation Precautions:  Precautions Precautions: Fall,Posterior Hip Precaution Booklet Issued: No Precaution Comments: PEG, posterior hip precautions Restrictions Weight Bearing Restrictions: Yes RUE Weight Bearing: Weight bearing as tolerated LUE Weight Bearing: Non weight bearing LLE Weight Bearing: TDWB    Vital Signs: Therapy Vitals Temp: 98 F (36.7 C) Pulse Rate: 90 Resp: 17 BP: 111/68 Patient Position (if appropriate): Sitting Oxygen Therapy SpO2: 100 % O2 Device: Room Air Pain:    Denies at rest     Therapy/Group: Individual Therapy  AuLorie Phenix/18/2022, 6:51 PM

## 2020-05-08 NOTE — Progress Notes (Signed)
Occupational Therapy Session Note  Patient Details  Name: Jeremy George. MRN: [id number] Date of Birth: [birth year]  Today's Date: 05/08/2020 OT Individual Time: 1345-1500 OT Individual Time Calculation (min): 75 min    Short Term Goals: Week 1:  OT Short Term Goal 1 (Week 1): Pt will sit EOB with max A during ADLs OT Short Term Goal 1 - Progress (Week 1): Met OT Short Term Goal 2 (Week 1): Pt will attend to oral care task with no more than mod cueing OT Short Term Goal 2 - Progress (Week 1): Met OT Short Term Goal 3 (Week 1): Pt will wash UB with max A OT Short Term Goal 3 - Progress (Week 1): Met  Skilled Therapeutic Interventions/Progress Updates:     Pt received in bed at EOB with mom blocking pt from getting OOB. Pt requires multiple attempts to redirect to task of donning shoes. Pt requires total A to don shoes and MOD A for stand pivot transfer to w/c with OT under LUE and pt pivoting and RLE with cues. Pt completes toileting during session with urinal with pt able to pull short shorts up and get urinal to peri area.    Therapeutic activity Pt completes standing connect four in same manner but pt attempting to put too much weight into LLE and pt fatigued after 30 sec standing therefore completed in sitting 2x for game completion with good sustained attention. Pt completes seated cornhole in outside courtyard with VC for calibration/stronger throw. Pt continues to perseverate on "smoking" and "calling an uber" when outside therefore relocated to inside. Pt plays Wii bowling in dayroom with min VC/demonstration of remote use. Pt able to bowl seated with min cues for turn taking.  Pt left at end of session in TIS with RN present with exit alarm on, call light in reach and all needs met     Therapy Documentation Precautions:  Precautions Precautions: Fall,Posterior Hip Precaution Booklet Issued: No Precaution Comments: PEG, posterior hip precautions Restrictions Weight  Bearing Restrictions: Yes RUE Weight Bearing: Weight bearing as tolerated LUE Weight Bearing: Non weight bearing LLE Weight Bearing: Touchdown weight bearing General:   Vital Signs: Therapy Vitals Temp: 98.1 F (36.7 C) Pulse Rate: 91 Resp: 18 BP: 119/71 Patient Position (if appropriate): Lying Oxygen Therapy SpO2: 100 % O2 Device: Room Air Pain: Pain Assessment Pain Scale: Faces Faces Pain Scale: Hurts even more Pain Location: Back Pain Intervention(s): Medication (See eMAR);Repositioned ADL: ADL Eating: Dependent Where Assessed-Eating: Edge of bed Grooming: Maximal assistance Where Assessed-Grooming: Edge of bed Upper Body Bathing: Unable to assess Lower Body Bathing: Unable to assess Upper Body Dressing: Unable to assess Lower Body Dressing: Unable to assess Toileting: Unable to assess Toilet Transfer: Unable to assess Tub/Shower Transfer: Unable to assess Gaffer Transfer: Unable to assess Social research officer, government Method: Unable to assess Vision   Perception    Praxis   Exercises:   Other Treatments:     Therapy/Group: Individual Therapy  Tonny Branch 05/08/2020, 6:39 AM

## 2020-05-08 NOTE — Progress Notes (Signed)
Pt is agitated and indicating he is in pain. Medications, warm blankets provided, and pt repositioned with pillows to be more comfortable.
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
## 2020-05-08 NOTE — Progress Notes (Signed)
Speech Language Pathology Daily Session Note  Patient Details  Name: Jeremy George. MRN: [id number] Date of Birth: 04-Nov-1995  Today's Date: 05/08/2020 SLP Individual Time: 0730-0830 SLP Individual Time Calculation (min): 60 min  Short Term Goals: Week 2: SLP Short Term Goal 1 (Week 2): Patient will utilize speech intelligibility strategies at the phrase level to achieve ~80% intelligibility with Max A multimodal cues. SLP Short Term Goal 2 (Week 2): Patient will demonstrate sustained attention to functional tasks for 10 mintues with Mod verbal cues for redirection. SLP Short Term Goal 3 (Week 2): Patient will utilize external aids for orientation X 4 with Min A multimodal cues. SLP Short Term Goal 4 (Week 2): Patient will consume current diet of Dys. 1 textures with thin liquids wiht minimal overt s/s of aspiration and overall Min A verbal cues for use of swallow strategies. SLP Short Term Goal 5 (Week 2): Patient will demonstrate efficient mastication and complete oral clearance with trials of Dys. 2 textures without overt s/s of aspiration and Min verbal cues over 2 sessions prior to upgrade.  Skilled Therapeutic Interventions: Skilled treatment session focused on cognitive and dysphagia goals. Upon arrival, patient appeared mildly confused. Patient reporting he had just pulled a 12 hour shift and was "done" due to needing a 2 week vacation. SLP provided orientation to place and situation as well as placed signs and a calendar to maximize recall and carryover of information. Patient performed oral care via the suction toothbrush with set-up assist and supervision verbal cues for thoroughness. Patient declined breakfast but did consume ~8 oz of water with delayed coughing, suspect due to rate of intake. Patient demonstrated increased mastication of ice chips, therefore, trials of diced peaches cut up into minced bite sizes were administered. Patient with poor awareness of bolus and required  Min verbal cues for mastication. Recommend ongoing trials with SLP. SLP also pureed grits and bacon that were brought in from his mother. Patient consumed 2 bites and reported he was "full." Patient left upright in bed with alarm on, waist belt on, and mom present. Continue with current plan of care.      Pain No/Denies Pain  Therapy/Group: Individual Therapy  Kimmarie Pascale 05/08/2020, 3:09 PM

## 2020-05-09 NOTE — Progress Notes (Signed)
Speech Language Pathology Daily Session Note  Patient Details  Name: Jeremy George. MRN: [id number] Date of Birth: 29-Dec-1995  Today's Date: 05/09/2020 SLP Individual Time: 0930-1000 SLP Individual Time Calculation (min): 30 min  Short Term Goals: Week 2: SLP Short Term Goal 1 (Week 2): Patient will utilize speech intelligibility strategies at the phrase level to achieve ~80% intelligibility with Max A multimodal cues. SLP Short Term Goal 2 (Week 2): Patient will demonstrate sustained attention to functional tasks for 10 mintues with Mod verbal cues for redirection. SLP Short Term Goal 3 (Week 2): Patient will utilize external aids for orientation X 4 with Min A multimodal cues. SLP Short Term Goal 4 (Week 2): Patient will consume current diet of Dys. 1 textures with thin liquids wiht minimal overt s/s of aspiration and overall Min A verbal cues for use of swallow strategies. SLP Short Term Goal 5 (Week 2): Patient will demonstrate efficient mastication and complete oral clearance with trials of Dys. 2 textures without overt s/s of aspiration and Min verbal cues over 2 sessions prior to upgrade.  Skilled Therapeutic Interventions: Skilled treatment session focused on dysphagia and cognitive goals. SLP facilitated session by providing extra time and overall Max A verbal and visual cues for basic problem solving during a money management task. Patient was mildly restless with decreased attention but was easily redirected. Patient agreeable to consuming a snack of Dys. 1 textures with thin liquids. Patient consumed without overt s/s of aspiration but required Min-Mod A verbal cues for attention to pureed bolus. Recommend patient continue current diet. Overall, patient appeared brighter today and more engaged with increased in overall speech intelligibility. Patient left upright in wheelchair with mother present and all needs within reach. Continue with current plan of care.       Pain No/Denies Pain   Therapy/Group: Individual Therapy  Briona Korpela 05/09/2020, 12:09 PM

## 2020-05-09 NOTE — Progress Notes (Signed)
Pt agitated, verbally abusive and combative this morning. 1mg  of IM ativan given. Pt non-violent restraints applied correctly and call bell at bedside.
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
## 2020-05-09 NOTE — Progress Notes (Signed)
Occupational Therapy Session Note  Patient Details  Name: Jeremy George. MRN: [id number] Date of Birth: January 07, 1996  Today's Date: 05/09/2020 OT Individual Time: 0721-8288 OT Individual Time Calculation (min): 75 min    Short Term Goals: Week 1:  OT Short Term Goal 1 (Week 1): Pt will sit EOB with max A during ADLs OT Short Term Goal 1 - Progress (Week 1): Met OT Short Term Goal 2 (Week 1): Pt will attend to oral care task with no more than mod cueing OT Short Term Goal 2 - Progress (Week 1): Met OT Short Term Goal 3 (Week 1): Pt will wash UB with max A OT Short Term Goal 3 - Progress (Week 1): Met  Skilled Therapeutic Interventions/Progress Updates:    1:1. Pt received in bed agreeable to OT. Pt completes stand pivot transfer after MAX A to compelte bed mobility d/t poor motor planning and cue following to abide by hip precautions. MIN A stand pivot transfer with OT under L arm and holding L leg off the ground to keep WB precautions. Pt completes bathing seated in w/c with S for UB with no cuing for sequencing and MIN A legs to wash feet d/t keeping hip precautions. Pt dons shirt with S and hoodie with MIN A to pull down back d/t decreased continuation skill. Pt completes LB dressing with MOD A to thread LLE first and MIN A sit to stand for pt to partial elevate pants past hips. Total A to don footwear. Pt completes seated pipe tree acticity with mod cuing to fix mistake d/t different length of PVC pipe to complete figure. Exited session with pt seated in TIS with handoff to SLP, exit alarm on and call light in reach   Therapy Documentation Precautions:  Precautions Precautions: Fall,Posterior Hip Precaution Booklet Issued: No Precaution Comments: PEG, posterior hip precautions Restrictions Weight Bearing Restrictions: Yes RUE Weight Bearing: Non weight bearing LUE Weight Bearing: Weight bearing as tolerated LLE Weight Bearing: Non weight bearing General:   Vital  Signs: Therapy Vitals Temp: 98 F (36.7 C) Pulse Rate: (!) 101 Resp: 18 BP: 103/61 Patient Position (if appropriate): Lying Oxygen Therapy SpO2: 100 % O2 Device: Room Air Pain:   ADL: ADL Eating: Dependent Where Assessed-Eating: Edge of bed Grooming: Maximal assistance Where Assessed-Grooming: Edge of bed Upper Body Bathing: Unable to assess Lower Body Bathing: Unable to assess Upper Body Dressing: Unable to assess Lower Body Dressing: Unable to assess Toileting: Unable to assess Toilet Transfer: Unable to assess Tub/Shower Transfer: Unable to assess Gaffer Transfer: Unable to assess Social research officer, government Method: Unable to assess Vision   Perception    Praxis   Exercises:   Other Treatments:     Therapy/Group: Individual Therapy  Tonny Branch 05/09/2020, 6:47 AM

## 2020-05-10 MED ORDER — QUETIAPINE FUMARATE 200 MG PO TABS
400.0000 mg | ORAL_TABLET | Freq: Every day | ORAL | Status: DC
  Administered 2020-05-10 – 2020-05-15 (×6): 400 mg
  Filled 2020-05-10 (×6): qty 2

## 2020-05-10 MED ORDER — QUETIAPINE FUMARATE 50 MG PO TABS
50.0000 mg | ORAL_TABLET | Freq: Every morning | ORAL | Status: DC
  Administered 2020-05-11 – 2020-05-16 (×5): 50 mg
  Filled 2020-05-10 (×5): qty 1

## 2020-05-10 MED ORDER — CHLORDIAZEPOXIDE HCL 25 MG PO CAPS
25.0000 mg | ORAL_CAPSULE | Freq: Three times a day (TID) | ORAL | Status: DC
  Administered 2020-05-10 – 2020-05-12 (×6): 25 mg
  Filled 2020-05-10 (×6): qty 1
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
## 2020-05-10 NOTE — Progress Notes (Signed)
Speech Language Pathology Daily Session Note  Patient Details  Name: Jeremy George. MRN: [id number] Date of Birth: 09-06-1995  Today's Date: 05/10/2020 SLP Individual Time: 1002-1029 SLP Individual Time Calculation (min): 27 min  Short Term Goals: Week 2: SLP Short Term Goal 1 (Week 2): Patient will utilize speech intelligibility strategies at the phrase level to achieve ~80% intelligibility with Max A multimodal cues. SLP Short Term Goal 2 (Week 2): Patient will demonstrate sustained attention to functional tasks for 10 mintues with Mod verbal cues for redirection. SLP Short Term Goal 3 (Week 2): Patient will utilize external aids for orientation X 4 with Min A multimodal cues. SLP Short Term Goal 4 (Week 2): Patient will consume current diet of Dys. 1 textures with thin liquids wiht minimal overt s/s of aspiration and overall Min A verbal cues for use of swallow strategies. SLP Short Term Goal 5 (Week 2): Patient will demonstrate efficient mastication and complete oral clearance with trials of Dys. 2 textures without overt s/s of aspiration and Min verbal cues over 2 sessions prior to upgrade.  Skilled Therapeutic Interventions: Pt was seen for skilled ST targeting cognitive goals. [name] was present and supportive at bedside, engaged appropriately. Pt used verbal repetition and visualization compensatory memory strategies to encode and recall details from line drawn picture scenes ranging from 80-90% accuracy provided min A verbal cueing for use of strategies and questions to further aid recall. When attempting to turn his TV off, he expressed that he wished to keep it on. SLP discussed how increased distractions may make it more difficult for him to focus, however allowed him to keep TV on with sound muted to see his ability to selectively attend, which pt only required very minimal cues for redirection to task. Pt left semi-reclined in bed with alarm set and abdominal restraint still  applied (was note removed during session), mother still present and needs within reach. Continue per current plan of care.        Pain Pain Assessment Pain Scale: Faces Faces Pain Scale: No hurt  Therapy/Group: Individual Therapy  [name] 05/10/2020, 7:32 AM

## 2020-05-11 LAB — BASIC METABOLIC PANEL
Anion gap: 8 (ref 5–15)
BUN: 9 mg/dL (ref 6–20)
CO2: 27 mmol/L (ref 22–32)
Calcium: 9.4 mg/dL (ref 8.9–10.3)
Chloride: 99 mmol/L (ref 98–111)
Creatinine, Ser: 0.63 mg/dL (ref 0.61–1.24)
GFR, Estimated: >60 mL/min (ref >60)
Glucose, Bld: 111 mg/dL — ABNORMAL HIGH (ref 70–99)
Potassium: 3.6 mmol/L (ref 3.5–5.1)
Sodium: 134 mmol/L — ABNORMAL LOW (ref 135–145)

## 2020-05-11 LAB — CBC
HCT: 32.3 % — ABNORMAL LOW (ref 39.0–52.0)
Hemoglobin: 10.5 g/dL — ABNORMAL LOW (ref 13.0–17.0)
MCH: 27 pg (ref 26.0–34.0)
MCHC: 32.5 g/dL (ref 30.0–36.0)
MCV: 83 fL (ref 80.0–100.0)
Platelets: 433 10*3/uL — ABNORMAL HIGH (ref 150–400)
RBC: 3.89 MIL/uL — ABNORMAL LOW (ref 4.22–5.81)
RDW: 14 % (ref 11.5–15.5)
WBC: 6.9 10*3/uL (ref 4.0–10.5)
nRBC: 0 % (ref 0.0–0.2)

## 2020-05-11 LAB — PREALBUMIN: Prealbumin: 25.4 mg/dL (ref 18–38)
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
## 2020-05-11 NOTE — Progress Notes (Signed)
Speech Language Pathology Daily Session Note  Patient Details  Name: Jeremy George. MRN: [id number] Date of Birth: Mar 09, 1995  Today's Date: 05/11/2020 SLP Individual Time: 8338-2505 SLP Individual Time Calculation (min): 40 min  Short Term Goals: Week 2: SLP Short Term Goal 1 (Week 2): Patient will utilize speech intelligibility strategies at the phrase level to achieve ~80% intelligibility with Max A multimodal cues. SLP Short Term Goal 2 (Week 2): Patient will demonstrate sustained attention to functional tasks for 10 mintues with Mod verbal cues for redirection. SLP Short Term Goal 3 (Week 2): Patient will utilize external aids for orientation X 4 with Min A multimodal cues. SLP Short Term Goal 4 (Week 2): Patient will consume current diet of Dys. 1 textures with thin liquids wiht minimal overt s/s of aspiration and overall Min A verbal cues for use of swallow strategies. SLP Short Term Goal 5 (Week 2): Patient will demonstrate efficient mastication and complete oral clearance with trials of Dys. 2 textures without overt s/s of aspiration and Min verbal cues over 2 sessions prior to upgrade.  Skilled Therapeutic Interventions: Pt was seen for skilled ST targeting dysphagia and cognitive goals. Upon arrival, pt's mother was feeding pt regular textures that were chopped but not minced or pureed. Pt had a moderate amount of potato and bacon in left buccal cavity, and required Moderate verbal cues for attention to mastication and effectiveness of strategies to clear it. Discussed pt's current diet recommendations and rationale with pt and his mother, however his mother reported "he has to eat and refuses to eat pureed". SLP reinforced recommendation to use bullet to puree his preferred food items given cognition and wires in his mouth/jaw limiting ROM and overall effectiveness of mastication. Pt required Min A verbal cues for problem solving and some physical assistance to open his toothpaste  when setting up oral care. During a basic PEG board task, pt recreated 1 basic design of alternating colors with Moderate cues for error awareness, but Min A for problem solving corrections. Of note, pt is color blind, however is able to discriminate between some color differences (ex: can discriminate that blue is different from red). Therefore, the appropriate accommodations were made for color blindness. Pt sustained his attention well this morning, requiring mostly Min A verbal cues for redirection. Pt left laying in bed with alarm set and needs within reach, RN and mother present. Continue per current plan of care.          Pain Pain Assessment Pain Scale: Faces Faces Pain Scale: No hurt  Therapy/Group: Individual Therapy  Jeremy George 05/11/2020, 7:15 AM

## 2020-05-11 NOTE — Progress Notes (Signed)
Occupational Therapy Session Note  Patient Details  Name: Jeremy George. MRN: [id number] Date of Birth: April 05, 1995  Today's Date: 05/11/2020 OT Individual Time: 1350-1430 OT Individual Time Calculation (min): 40 min    Short Term Goals: Week 2:  OT Short Term Goal 1 (Week 2): Pt will don shirt with mod A OT Short Term Goal 2 (Week 2): Pt will attend to UB bathing task with no more than mod cueing OT Short Term Goal 3 (Week 2): Pt will maintain arousal during one ADL task with no more than mod cueing  Skilled Therapeutic Interventions/Progress Updates:    Pt supine with mother present, no c/o pain at rest and agreeable to OT session. Pt alert and oriented x4 despite obvious deficits in awareness. Pt completed bed mobility with mod facilitation for moving the LLE within posterior hip precautions. Pt completed stand pivot transfer to the w/c with min A. Pt was brought to the therapy gym via w/c. Pt stood from w/c with mod A and required mod cueing for adherence to all precautions, especially LLE TDWB. Used a crutch under pt's L shoulder (with NO hand/wrist support) to assist with balance in standing. He completed functional reaching task with min A overall for balance. Pt completed alphabet sequencing task on the BITS seated with 66.67% accuracy and cueing required for working memory and sustained attention to task. Pt returned to his room. Safety plan was updated to allow nursing staff to complete transfers. Edu provided to pt's current RN to ensure proper carryover of precautions adherence. Pt was left sitting up in his w/c with chair alarm belt fastened, all needs met. Telesitter on.   Therapy Documentation Precautions:  Precautions Precautions: Fall,Posterior Hip Precaution Booklet Issued: No Precaution Comments: PEG, posterior hip precautions Restrictions Weight Bearing Restrictions: Yes RUE Weight Bearing: Weight bearing as tolerated LUE Weight Bearing: Non weight bearing LLE  Weight Bearing: Non weight bearing  Therapy/Group: Individual Therapy  Curtis Sites 05/11/2020, 6:43 AM

## 2020-05-11 NOTE — Progress Notes (Signed)
Physical Therapy Session Note  Patient Details  Name: Jeremy George. MRN: [id number] Date of Birth: [birth year]  Today's Date: 05/11/2020 PT Individual Time: 1009-1110 and 6767-2094 PT Individual Time Calculation (min): 61 min and 77 min  Short Term Goals: Week 2:  PT Short Term Goal 1 (Week 2): Patient will perform bed mobility with mod-max A of 1 person consistently. PT Short Term Goal 2 (Week 2): Patient will perform standing balance >1 min while maintaining weight bearing precautions with max A +2. PT Short Term Goal 3 (Week 2): Patient will perform basic transfers with max A +1 consistently. PT Short Term Goal 4 (Week 2): Patient will initiate manual w/c mobility.  Skilled Therapeutic Interventions/Progress Updates:    Session 1: Pt received supine in bed with his mother present. Pt agreeable to therapy session and requesting to bathe prior to donning clean clothes. Pt requires max cuing and education to recall L UE and L LE WBing and L LE posterior hip precautions - continues to demonstrate impaired ability to understand hip precautions and is unable to implement them during mobility tasks without max cuing. Pt initiates pulling himself into long sitting to progress to EOB but with cuing is able to redirect and wait for therapist to set-up room (improvement compared to previous session when pt repeatedly attempted to progress to EOB despite cuing). Supine>sitting R EOB, mod assist for B LE management and trunk upright. Throughout session, when in seated position, pt demonstrates significant trunk kyphotic posturing with excessive posterior pelvic tilt and neck flexion with downward gaze, upon attempts at cuing to improve pt states he is unable with limited effort to correct posture. R squat pivot transfer with L UE around therapist's shoulders and therapist lifting L LE to facilitate maintaining WBing precautions with mod assist for lifting/pivoting and controlling eccentric descent.  Set-up at sink to perform bathing - pt demonstrates primarily using R UE to complete bathing tasks mostly integrating LUE for bimanual tasks only - requires total assist to wash under R arm and put on deodorant because of limited L elbow flexion ROM - requires total assist to complete lower leg and feet bathing due to L posterior hip precautions - mod/max assist sit<>stand at sink with L UE around thearpist's shoulders, R UE support on sink, and therapist assisting with L LE to maintain WBing precautions while +2 assist completed peri-care as pt noted to be incontinent of small amout of stool. Sitting in w/c donned clean shirt with cuing for orientation of shirt, donned pants max assist for threading over LEs and then total assist of +2 to pull over hips while standing as just described. Upon initiating donning shoes pt asks if he is putting them on because he is going home today - therapist gently reoriented patient to therapy POC with ELOS. Pt became silent and not verbally responsive to therapist's questions, appearing upset. [name] entered room and was able to provide emotional support with therapist and pt agreeable to have estimated D/C date placed on board in room with understanding that it can change - pt nodded head in agreement. Pt continued to remain very quiet with minimal to no verbal responses to questions. With encouragement and significantly increased time pt agreeable to transfer back to bed. R squat pivot as described above. Once sitting on EOB, with continued poor trunk posture as described above, pt perseverating on calling the nurse, pushing call button and asking for "Jeremy George" - with time and gentle redirection pt agreeable to return  to supine. Sit>supine with +2 mod assist for trunk descent and B LE management. Pt left supine in bed with needs in reach, Posey bed belt on, bed alarm on, and his mother present.   Session 2: Pt received sitting in TIS w/c with his mother present working on  a puzzle together. Pt agreeable to therapy session. [name] reports he has only eaten an apple for lunch - therapist provided supervision for pt to eat 2 additional apple slices with no signs of aspiration.  Transported to/from gym in w/c for time management and energy conservation. R squat pivot transfer w/c>EOM with mod assist for lifting/pivoting hips and continuing to have pt use L UE support around therapist's shoulders while therapist provided manual facilitation to maintain L LE TDWBing - pt noted to keep R LE bent too much preventing him from being able to pivot hips fully during transfer to land on mat. L squat pivot transfer EOM>w/c with heavier mod assist for lifting/pivoting hips in this direction and therapist continuing with same position as above staying on pt's L side to maintain L UE and LE WBing precautions while allowing facilitation of lifting/pivoting hips. R squat pivot back to EOM as above though with improved R foot position (not tucked underneath him too far) to allow hips to pivot - pt able to progress to lighter mod assist and demonstrates improved control of transfer and softer landing on mat. Sit>supine mod assist of 1 for B LE management onto mat and cuing to avoid WBing through L elbow.  Supine exercises:  - static supine stretch for L LE hip flexors and hamstrings with pt lacking at least 10-15 degrees of hip and knee extension and reporting discomfort with static stretching, 2x22minute - L LE heel slides with washcloth to decrease friction of shoe x9 reps with light tactile cuing to complete through full ROM - engaged in dual-task with pt able to count repetitions accurately and recall goal number of reps without cuing - L LE hip abduction/adduction to midline x5 reps with pt reporting significant pain with this movement calling it "torture" - completed only through ~5-8degrees of ROM from neutral into abduction with pt still stating significant pain therefore limited  repetitions and provided rest break after Supine>sitting L EOB with pt demoing poor recall of L UE NWBing precaution coming into reclined position on elbows - cuing to correct and heavy mod assist provided for trunk upright and B LE management off EOM. In sitting, pt continues to demonstrate excessive thoracic kyphosis, posterior pelvic tilt, and cervical flexion with downward gaze throughout session. L squat pivot back to w/c with assist as described above though poor recall/carryover of cuing and sequencing of transfer. Pt spontaneously talking about whether or not he will be able to return to basketball and seemed to be comforted by encouragement that one day he may be able to participate in the sport at some level - pt starting to demonstrate emerging awareness of his deficits and limitations with mobility. Transported back to room in w/c. Pt suddenly becoming tearful and emotional telling his mother that there is not a basketball team here then became fixated and perseverated on saying "what's the name of the team?" initially then "there's not a basketball team here." [name] and therapist provided emotional support and with significantly increased time able to redirect and encourage pt to transfer back to bed. Once at EOB pt saw posey belt alarm and became distracted and upset/frustrated by this pulling at it during the  transfer with difficulty redirecting him to ensure he was maintaining WBing precautions. Once on EOB pt started to "shut down" as pt's mother states where pt becomes very quiet and has decreased verbal response to questions - pt continues to perseverate on fact that he cannot play basketball and start's saying "let's go" clarifying that he wants to go home - pt's mother able to redirect pt and +2 mod assist provided to lie him back in the bed. Pt continued to demonstrate increasing frustration, vocalizing louder stating "let's go" and starting to initiate OOB mobility - Josh, RN present  and instructed to provide 2-point soft wrist restraints to maintain pt safety. Pt left supine in bed with needs in reach, posey belt alarm on, soft wrist restraints donned, pt's mother and RN present to assume care of patient.  Therapy Documentation Precautions:  Precautions Precautions: Fall,Posterior Hip Precaution Booklet Issued: No Precaution Comments: PEG, posterior hip precautions Restrictions Weight Bearing Restrictions: Yes RUE Weight Bearing: Weight bearing as tolerated LUE Weight Bearing: Non weight bearing LLE Weight Bearing: Non weight bearing  Pain: Session 1: No reports of pain throughout session.  Session 2: Verbalizes significant pain in L hip with supine exercises and demonstrates significant ROM limitations - pt's mother notified and educated on having pt lie flat in the bed to stretch and perform gentle hip abduction ROM through small range - provided rest breaks, repositioning, emotional support, and distraction for pain management as pt declines medication. [name] states when he is in pain he will bring LEs into hooklying and "curl up."   Therapy/Group: Individual Therapy  Ginny Forth , PT, DPT, CSRS  05/11/2020, 8:03 AM

## 2020-05-11 NOTE — Progress Notes (Signed)
Orthopedic Tech Progress Note Patient Details:  Jeremy George. 07/21/1995 [id number] Patient ABDOMINAL BINDER soiled, needed a new one. Dropped off to room Ortho Devices Type of Ortho Device: Abdominal binder Ortho Device/Splint Location: STOMACH Ortho Device/Splint Interventions: Other (comment)   Post Interventions Patient Tolerated: Well Instructions Provided: Care of device   [name] 05/11/2020, 12:12 PM

## 2020-05-12 MED ORDER — CHLORDIAZEPOXIDE HCL 5 MG PO CAPS
10.0000 mg | ORAL_CAPSULE | Freq: Three times a day (TID) | ORAL | Status: DC
  Administered 2020-05-12 – 2020-05-14 (×6): 10 mg
  Filled 2020-05-12 (×7): qty 2

## 2020-05-12 MED ORDER — CITALOPRAM HYDROBROMIDE 10 MG PO TABS
10.0000 mg | ORAL_TABLET | Freq: Every day | ORAL | Status: DC
  Administered 2020-05-12 – 2020-05-13 (×2): 10 mg via ORAL
  Filled 2020-05-12 (×2): qty 1

## 2020-05-12 MED ORDER — METHOCARBAMOL 750 MG PO TABS
750.0000 mg | ORAL_TABLET | Freq: Three times a day (TID) | ORAL | Status: DC
  Administered 2020-05-12 – 2020-05-16 (×11): 750 mg
  Filled 2020-05-12 (×11): qty 1

## 2020-05-12 NOTE — Progress Notes (Signed)
Patient ID: Jeremy Palms., male   DOB: 1995-10-23, 25 y.o.   MRN: [id number]  SW met with pt and mother in room to provide updates from team conference. Pt was sleeping at time of visit. Patient mother is concerned about pt mental health and differentiating between what is also TBI related. SW discussed charity HH, and a possible TBI program available through [place]. SW informed will explore options to see if there is any funding.   Jeremy George, MSW, [place] Office: [phone] Cell: [phone] Fax: [phone]

## 2020-05-12 NOTE — Progress Notes (Signed)
Physical Therapy Weekly Progress Note  Patient Details  Name: Jeremy George. MRN: [id number] Date of Birth: December 28, 1995  Beginning of progress report period: May 05, 2020 End of progress report period: May 12, 2020  Patient has met 4 of 4 short term goals.  Jeremy George is progressing well with therapy demonstrating increasing independence with functional mobility; however, some limitations in participation due to emerging awareness of impairments resulting in reserved behavior with limited verbal response to questions and decreased participation. Pt is performing supine<>sit with varying mod assist of 1-2 assist, bed<>chair transfers with varying mod/max assist, sit<>stands with +2 mod assist, and has initiated R hemi-technique manual wheelchair propulsion. Pt continues to require manual facilitation and max cuing/education to recall and maintain L UE and L LE precautions during functional mobility.   Patient continues to demonstrate the following deficits muscle weakness and muscle joint tightness, decreased cardiorespiratoy endurance, impaired timing and sequencing, unbalanced muscle activation and decreased motor planning,  , decreased initiation, decreased attention, decreased awareness, decreased problem solving, decreased safety awareness, decreased memory and delayed processing and decreased sitting balance, decreased standing balance, decreased postural control, decreased balance strategies and difficulty maintaining precautions and therefore will continue to benefit from skilled PT intervention to increase functional independence with mobility.  Patient progressing toward long term goals..  Continue plan of care.  PT Short Term Goals Week 2:  PT Short Term Goal 1 (Week 2): Patient will perform bed mobility with mod-max A of 1 person consistently. PT Short Term Goal 1 - Progress (Week 2): Met PT Short Term Goal 2 (Week 2): Patient will perform standing balance >1 min while  maintaining weight bearing precautions with max A +2. PT Short Term Goal 2 - Progress (Week 2): Met PT Short Term Goal 3 (Week 2): Patient will perform basic transfers with max A +1 consistently. PT Short Term Goal 3 - Progress (Week 2): Met PT Short Term Goal 4 (Week 2): Patient will initiate manual w/c mobility. PT Short Term Goal 4 - Progress (Week 2): Met Week 3:  PT Short Term Goal 1 (Week 3): Pt will perform supine<>sit with mod assist of 1 person consistently PT Short Term Goal 2 (Week 3): Pt will perform bed<>chair transfers with mod assist of 1 person consistently in both directions PT Short Term Goal 3 (Week 3): Pt will perform sit<>stands using R UE support and mod assist of 1 person consistently PT Short Term Goal 4 (Week 3): Pt will propel wheelchair 71f with supervision  Skilled Therapeutic Interventions/Progress Updates:  Ambulation/gait training;Cognitive remediation/compensation;Discharge planning;DME/adaptive equipment instruction;Functional mobility training;Pain management;Psychosocial support;Splinting/orthotics;Therapeutic Activities;UE/LE Strength taining/ROM;Visual/perceptual remediation/compensation;Wheelchair propulsion/positioning;UE/LE Coordination activities;Therapeutic Exercise;Stair training;Skin care/wound management;Patient/family education;Functional electrical stimulation;Neuromuscular re-education;Disease management/prevention;Community reintegration;Balance/vestibular training   Therapy Documentation Precautions:  Precautions Precautions: Fall,Posterior Hip Precaution Booklet Issued: No Precaution Comments: PEG, posterior hip precautions Restrictions Weight Bearing Restrictions: Yes RUE Weight Bearing: Weight bearing as tolerated LUE Weight Bearing: Non weight bearing LLE Weight Bearing: Non weight bearing   CTawana Scale, PT, DPT, CSRS  05/12/2020, 12:38 PM

## 2020-05-12 NOTE — Progress Notes (Signed)
Physical Therapy Session Note  Patient Details  Name: Jeremy George. MRN: [id number] Date of Birth: 1995/11/16  Today's Date: 05/12/2020 PT Individual Time: 8871-9597 PT Individual Time Calculation (min): 57 min   Short Term Goals: Week 3:  PT Short Term Goal 1 (Week 3): Pt will perform supine<>sit with mod assist of 1 person consistently PT Short Term Goal 2 (Week 3): Pt will perform bed<>chair transfers with mod assist of 1 person consistently in both directions PT Short Term Goal 3 (Week 3): Pt will perform sit<>stands using R UE support and mod assist of 1 person consistently PT Short Term Goal 4 (Week 3): Pt will propel wheelchair 62ft with supervision  Skilled Therapeutic Interventions/Progress Updates:    Pt received supine in bed with his mother present and pt agreeable to therapy session. Pt unable to recall L UE and L LE WBing precautions despite question cuing (instead pt states that he is allowed to stand on L LE) - reinforced education. Pt appears to become more reserved/upset after this education therefore utilized redirection cuing to continue engaging patient in OOB mobility. Supine>sitting R EOB, HOB slightly elevated, with max multimodal cuing for sequencing to increase independence while maintaining WBing restrictions - min assist for L UE and LE management. R squat/stand pivot transfer EOB>TIS w/c with heavy mod assist of 1 for lifting/pivoting hips - pt places L UE around therapist's shoulders to avoid WBing and therapist provides manual facilitation to lift L LE maintaining WBing precautions - continue to have difficulty getting R foot in proper position to allow improved pivoting of hips to allow more controlled landing in w/c, despite attempting to carryover improved placement from yesterday. While therapist donning w/c leg rests pt suddenly becoming tearful - provided emotional support and pt continues to remain quiet/reserved throughout remainder of session but nods  head in agreement to continue participating and is receptive to all cuing. Transported to/from gym in w/c for time management and energy conservation. L squat pivot TIS w/c>EOM with heavier mod assist for lifting/pivotng hips in this direction - therapist positioned as above on L side to maintain WBing restrictions. Same transfer from EOM to standard wheelchair to advance towards w/c mobility. Therapist retrieved B LE elevating leg rests for improved positioning in w/c and to maintain L posterior hip precautions. R LE w/c mobility ~30ft with mod assist for forward movement (of note pt not wearing shoes at this time resulting in decreased traction on floor) - demos very minimal LE movement with short propulsion strokes requiring cuing for improvement. Pt continues to remain tearful throughout session stating "I want to go home" - therapist gently reinforced education on current POC and upcoming orthopedic surgeries with pt remaining quiet/reserved but participatory. Performed sitting in w/c L UE elbow flexion/extension AROM - significant ROM limitations noted with pt only achieving ~10-15degrees of total arc movement (contracted near ~20degrees elbow flexion).  Transported back to room. R squat pivot as above w/c>EOB. Sit>R sidelying mod assist for B LE management into bed - therapist provided pillows between pt's knees to maintain hip precautions. Pt left R sidelying in bed with needs in reach, bed alarm on, posey belt alarm on (pt not demonstrating any resistance to the belt at this time), and his mother present.   Therapy Documentation Precautions:  Precautions Precautions: Fall,Posterior Hip Precaution Booklet Issued: No Precaution Comments: PEG, posterior hip precautions Restrictions Weight Bearing Restrictions: Yes RUE Weight Bearing: Weight bearing as tolerated LUE Weight Bearing: Non weight bearing LLE Weight Bearing:  Non weight bearing  Pain:   Reports some discomfort/pain during gentle L UE  elbow AROM but unable to verbalize details of pain - limited to 10 reps for pain management.   Therapy/Group: Individual Therapy  Ginny Forth , PT, DPT, CSRS  05/12/2020, 12:31 PM

## 2020-05-12 NOTE — Patient Care Conference (Signed)
Inpatient RehabilitationTeam Conference and Plan of Care Update Date: 05/12/2020   Time: 10:15 AM    Patient Name: Jeremy George.      Medical Record Number: [id number]  Date of Birth: 1995/05/02 Sex: Male         Room/Bed: 4W16C/4W16C-01 Payor Info: Payor: /    Admit Date/Time:  04/27/2020  4:59 PM  Primary Diagnosis:  TBI (traumatic brain injury) [facility])  Hospital Problems: Principal Problem:   TBI (traumatic brain injury) (HCC) Active Problems:   Protein-calorie malnutrition, severe   Sinus tachycardia   Postoperative pain   Thrombocytosis    Expected Discharge Date: Expected Discharge Date: 06/02/20  Team Members Present: Physician leading conference: Dr. Faith Rogue Care Coodinator Present: Kennyth Arnold, RN, BSN, CRRN;Cecile Sheerer, LCSWA Nurse Present: Kennyth Arnold, RN PT Present: Serina Cowper, PT OT Present: Jake Shark, OT SLP Present: Feliberto Gottron, SLP PPS Coordinator present : Fae Pippin, SLP     Current Status/Progress Goal Weekly Team Focus  Bowel/Bladder   incontinent of B/B. Last BM- 3/20  Pt will become continent of B/B  assess for toileting  q 2 hours   Swallow/Nutrition/ Hydration   Dys. 1 textures with thin liquids, Min-Mod A  Mod I  use of swallow strateiges, trials of Dys. 2 textures   ADL's   Improvement in arousal, min-mod A stand pivots, CGA UB ADLs, mod A LB ADLs  min A- CGA overall  ADL retraining, transfers, cognitive retraining, attention/arousal   Mobility   Max-mod A bed mobility, mod-min stand and stand pivot without AD to maintian NWB of L upper extremity, gait 40 ft +2 3 musketeer to maintain precautions  Min A overall, w/c 50 ft supervision  Activity tolerance, strength/ROM, adherence to precautions, functional mobility, w/c mobility, balance, cognitive retraining, patient/caregiver education   Communication   Min-Mod A  Supervision  use of speech intelligibility strategies   Safety/Cognition/ Behavioral  Observations  Mod-Max A  Min A  orientation, sustained attention, functional problem solving, awareness   Pain   No signs of pain  Pain<3  assess pain q shift and PRN   Skin   multiple abrasion and incision sites  Pt will not develop new skin breakdown  assess q shift and PRN     Discharge Planning:  To be assessed. Pt is Uninsured. Per EMR, pt to discharge to home with his mother who will provide 24/7 care. Assistance from girlfriend as well.   Team Discussion: Continuing to wean pain medications, continent with incontinence, generalized pain, refusing some medications. Will outpatient of home health be better? Patient on target to meet rehab goals: Yes, improvement in arousal, decreased safety awareness, very tearful today. Questioning why is he here, suicidal ideations but no plans. ADL's have improved. Nursing cleared for transfers. Recalling precautions but unable to manage them himself. Speech is improving, more awake. Mom has been giving him small bites of hashbrown and whopper. Definitely not on his dysphagia diet.   *See Care Plan and progress notes for long and short-term goals.   Revisions to Treatment Plan:  MD continues to wean pain medications.  Teaching Needs: Family education, medication management, pain management, depression management, bowel/bladder management, dysphagia management, skin/wound care, transfer training, gait training, balance training, endurance training, WC mobility training, weight bearing precautions.  Current Barriers to Discharge: Inaccessible home environment, Decreased caregiver support, Medical stability, Home enviroment access/layout, Incontinence, Wound care, Lack of/limited family support, Weight bearing restrictions, Medication compliance, Behavior and Nutritional means  Possible Resolutions to  Barriers: Continue current medications, offer nutritional support, provide emotional support.     Medical Summary Current Status: behavior improved,  more alert. tolerating diet. pain improved.  still limited with diet d/t wires  Barriers to Discharge: Medical stability   Possible Resolutions to [employer] Focus: daily assessment of labs/pt data, sleep restoration, pt/family ed   Continued Need for Acute Rehabilitation Level of Care: The patient requires daily medical management by a physician with specialized training in physical medicine and rehabilitation for the following reasons: Direction of a multidisciplinary physical rehabilitation program to maximize functional independence : Yes Medical management of patient stability for increased activity during participation in an intensive rehabilitation regime.: Yes Analysis of laboratory values and/or radiology reports with any subsequent need for medication adjustment and/or medical intervention. : Yes   I attest that I was present, lead the team conference, and concur with the assessment and plan of the team.   Tennis Must 05/12/2020, 3:39 PM

## 2020-05-12 NOTE — Plan of Care (Signed)
  Problem: Safety: Goal: Non-violent Restraint(s) Outcome: Progressing   Problem: Consults Goal: RH BRAIN INJURY PATIENT EDUCATION Description: Description: See Patient Education module for eduction specifics Outcome: Progressing Goal: Skin Care Protocol Initiated - if Braden Score 18 or less Description: If consults are not indicated, leave blank or document N/A Outcome: Progressing   Problem: RH BOWEL ELIMINATION Goal: RH STG MANAGE BOWEL WITH ASSISTANCE Description: STG Manage Bowel with min Assistance. Outcome: Progressing Goal: RH STG MANAGE BOWEL W/MEDICATION W/ASSISTANCE Description: STG Manage Bowel with Medication with min Assistance. Outcome: Progressing   Problem: RH SKIN INTEGRITY Goal: RH STG MAINTAIN SKIN INTEGRITY WITH ASSISTANCE Description: STG Maintain Skin Integrity With min Assistance. Outcome: Progressing Goal: RH STG ABLE TO PERFORM INCISION/WOUND CARE W/ASSISTANCE Description: STG Able To Perform Incision/Wound Care With min Assistance. Outcome: Progressing   Problem: RH SAFETY Goal: RH STG ADHERE TO SAFETY PRECAUTIONS W/ASSISTANCE/DEVICE Description: STG Adhere to Safety Precautions With min Assistance/Device. Outcome: Progressing Goal: RH STG DECREASED RISK OF FALL WITH ASSISTANCE Description: STG Decreased Risk of Fall With min Assistance. Outcome: Progressing   Problem: RH COGNITION-NURSING Goal: RH STG USES MEMORY AIDS/STRATEGIES W/ASSIST TO PROBLEM SOLVE Description: STG Uses Memory Aids/Strategies With cues and reminders to Problem Solve. Outcome: Progressing Goal: RH STG ANTICIPATES NEEDS/CALLS FOR ASSIST W/ASSIST/CUES Description: STG Anticipates Needs/Calls for Assist With min Assistance/Cues. Outcome: Progressing   Problem: RH PAIN MANAGEMENT Goal: RH STG PAIN MANAGED AT OR BELOW PT'S PAIN GOAL Description: <4 on a 0-10 pain scale. Outcome: Progressing   Problem: RH KNOWLEDGE DEFICIT BRAIN INJURY Goal: RH STG INCREASE KNOWLEDGE OF  SELF CARE AFTER BRAIN INJURY Description: Patient will be able to demonstrate knowledge of medication management, dietary management, weight bearing precautions, skin/wound care with educational materials and handouts provided by staff. Outcome: Progressing

## 2020-05-12 NOTE — Progress Notes (Signed)
Occupational Therapy Weekly Progress Note  Patient Details  Name: Jeremy George. MRN: [id number] Date of Birth: 1995/09/28  Beginning of progress report period: May 05, 2020 End of progress report period: May 12, 2020  Today's Date: 05/12/2020 OT Individual Time: 4097-3532 OT Individual Time Calculation (min): 40 min    Patient has met 3 of 3 short term goals.  Pt continues to make good progress with OT. His alertness and arousal has improved and participation more consistent. Pt can complete UB ADLs with CGA and LB with mod A. His stand pivot transfers have improved to min-mod A with manual facilitation required for adherence to weightbearing precautions. [name] is almost always present for sessions and very supportive.   Patient continues to demonstrate the following deficits: muscle weakness and muscle joint tightness, decreased initiation, decreased attention, decreased awareness, decreased problem solving, decreased safety awareness, decreased memory and delayed processing and decreased sitting balance, decreased standing balance, decreased postural control, decreased balance strategies and difficulty maintaining precautions and therefore will continue to benefit from skilled OT intervention to enhance overall performance with BADL and Reduce care partner burden.  Patient progressing toward long term goals..  Continue plan of care.  OT Short Term Goals Week 2:  OT Short Term Goal 1 (Week 2): Pt will don shirt with mod A OT Short Term Goal 1 - Progress (Week 2): Met OT Short Term Goal 2 (Week 2): Pt will attend to UB bathing task with no more than mod cueing OT Short Term Goal 2 - Progress (Week 2): Met OT Short Term Goal 3 (Week 2): Pt will maintain arousal during one ADL task with no more than mod cueing OT Short Term Goal 3 - Progress (Week 2): Met Week 3:  OT Short Term Goal 1 (Week 3): Pt will complete BSC transfers with min A OT Short Term Goal 2 (Week 3): Pt will  complete bathing at shower level with mod A OT Short Term Goal 3 (Week 3): Pt will demonstrate emergent awareness with mod cueing  Skilled Therapeutic Interventions/Progress Updates:    Pt supine with no c/o pain but very emotionally labile, crying throughout majority of session. 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 Provided emotional support, encouragement, and redirection to task. Pt required max cueing for initiation of bed mobility to EOB and mod A overall. Manual facilitation required for adherence to L posterior hip precautions. Pt sat EOB with close supervision with kyphotic posture, bed elevated to maintain L hip angle >90 degrees. Pt donned new shirt with min A and mod cueing to initiate. Pt brief with incontinent urine- he stood from EOB with blatant non-compliance to TDWB despite OT best efforts to keep LLE off the ground. Pt becoming frustrated and putting foot on the ground. He completed LB dressing and hygiene with mod A overall. Stand pivot transfer to his w/c with mod A. Pt completed oral care at the sink with set up assist and min cueing for initiation. Pt was left sitting up in the w/c with all needs met, seat belt and alarm belt on. Mother present.   Therapy Documentation Precautions:  Precautions Precautions: Fall,Posterior Hip Precaution Booklet Issued: No Precaution Comments: PEG, posterior hip precautions Restrictions Weight Bearing Restrictions: Yes RUE Weight Bearing: Weight bearing as tolerated LUE Weight Bearing: Non weight bearing LLE Weight Bearing: Non weight bearing   Therapy/Group: Individual Therapy  Curtis Sites 05/12/2020, 6:24 AM

## 2020-05-12 NOTE — Progress Notes (Signed)
Occupational Therapy Session Note  Patient Details  Name: Jeremy George. MRN: [id number] Date of Birth: 09-24-1995  Today's Date: 05/12/2020 OT Individual Time: 1420-1503 OT Individual Time Calculation (min): 43 min    Short Term Goals: Week 3:  OT Short Term Goal 1 (Week 3): Pt will complete BSC transfers with min A OT Short Term Goal 2 (Week 3): Pt will complete bathing at shower level with mod A OT Short Term Goal 3 (Week 3): Pt will demonstrate emergent awareness with mod cueing  Skilled Therapeutic Interventions/Progress Updates:    Pt supine with RN present administering pain medication and mother on her way out to get pt lunch. Pt completed bed mobility with mod A to move LLE and trunk lifting to adhere to posterior hip precautions on the L. Pt completed sit > stand with poor adherence to TDWB despite OT best efforts to limit weight through the LLE. Stand pivot with min A. Pt was taken to the dayroom for remainder of session. Pt completed x2 standing level cornhole game to focus on standing balance with RLE support only- which he was able to maintain with min, progressing to mod A. Pt completed seated level connect 4 game to focus on sustained/selective attention and problem solving. He required min cueing overall for both at familiar game level. Pt then stood to complete basketball shooting with low hoop. Pt with RUE as active and LUE as gross assist despite cueing to limit use of LUE. Pt discouraged but more engaged in this preferred activity. Pt was returned to his room and transferred back to bed. Bed alarm and posey belt fastened, all needs met.   Therapy Documentation Precautions:  Precautions Precautions: Fall,Posterior Hip Precaution Booklet Issued: No Precaution Comments: PEG, posterior hip precautions Restrictions Weight Bearing Restrictions: Yes RUE Weight Bearing: Weight bearing as tolerated LUE Weight Bearing: Non weight bearing LLE Weight Bearing: Non weight  bearing   Therapy/Group: Individual Therapy  Curtis Sites 05/12/2020, 12:32 PM
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
## 2020-05-12 NOTE — Progress Notes (Signed)
Speech Language Pathology Daily Session Note  Patient Details  Name: Jeremy George. MRN: [id number] Date of Birth: 1995/09/14  Today's Date: 05/12/2020 SLP Individual Time: 0730-0820 SLP Individual Time Calculation (min): 50 min  Short Term Goals: Week 2: SLP Short Term Goal 1 (Week 2): Patient will utilize speech intelligibility strategies at the phrase level to achieve ~80% intelligibility with Max A multimodal cues. SLP Short Term Goal 2 (Week 2): Patient will demonstrate sustained attention to functional tasks for 10 mintues with Mod verbal cues for redirection. SLP Short Term Goal 3 (Week 2): Patient will utilize external aids for orientation X 4 with Min A multimodal cues. SLP Short Term Goal 4 (Week 2): Patient will consume current diet of Dys. 1 textures with thin liquids wiht minimal overt s/s of aspiration and overall Min A verbal cues for use of swallow strategies. SLP Short Term Goal 5 (Week 2): Patient will demonstrate efficient mastication and complete oral clearance with trials of Dys. 2 textures without overt s/s of aspiration and Min verbal cues over 2 sessions prior to upgrade.  Skilled Therapeutic Interventions: Skilled treatment session focused on dysphagia and cognitive goals. Upon arrival, patient was lethargic and slow to arouse. Patient declined breakfast despite SLP offering cereal. With extra time and max encouragement, patient consumed one bite with prolonged mastication. Patient did consume thin liquids via straw without overt s/s of aspiration. Patient's mother arrived and observed patient consumed small bites of a hash brown with efficient mastication and without overt s/s of aspiration. Recommend patient upgrade to Dys. 2 textures. Educated mom on appropriate textures, she verbalized understanding. Patient was independently oriented to place and independently utilized the calendar for orientation to date. Patient also able to verbalize dates of upcoming surgeries  and current discharge date. Patient left upright in bed with alarm on and mom present. Continue with current plan of care.      Pain No/Denies Pain   Therapy/Group: Individual Therapy  Brianni Manthe 05/12/2020, 12:44 PM

## 2020-05-13 MED ORDER — MEGESTROL ACETATE 400 MG/10ML PO SUSP
400.0000 mg | Freq: Two times a day (BID) | ORAL | Status: DC
  Administered 2020-05-13 – 2020-05-16 (×6): 400 mg
  Filled 2020-05-13 (×6): qty 10

## 2020-05-13 NOTE — Progress Notes (Signed)
Occupational Therapy Session Note  Patient Details  Name: Jeremy George. MRN: [id number] Date of Birth: May 23, 1995  Today's Date: 05/13/2020 OT Individual Time: 4854-6270 OT Individual Time Calculation (min): 60 min    Short Term Goals: Week 3:  OT Short Term Goal 1 (Week 3): Pt will complete BSC transfers with min A OT Short Term Goal 2 (Week 3): Pt will complete bathing at shower level with mod A OT Short Term Goal 3 (Week 3): Pt will demonstrate emergent awareness with mod cueing  Skilled Therapeutic Interventions/Progress Updates:    Pt received supine with his mother present, no c/o pain at rest. Pt with pillow between his legs which his mother states he has been doing himself to adhere to hip precautions. Pt completed bed mobility to EOB with max cueing for initiation and min A overall- no assist required for trunk elevation today! Stand pivot transfer to the w/c with min A- with OT facilitating LUE and LLE NWB/TDWB. Pt transferred to shower chair in similar fashion. Frequent cueing for reducing weightbearing through the LUE. Improved independence of bathing this session but pt still requiring cueing to initiate washing each body part. Min A for UB and mod A for LB. Pt would benefit from use of LH sponge next session. Pt completed stand pivot back to the w/c- mod A required d/t pivot to the L with heavier facilitation from OT to reduce LLE weightbearing. Pt completed grooming tasks seated at the sink- mirror uncovered with good self-awareness/recognition. Pt donned shirt with min A. Pants with mod A. Pt was left sitting up in the TIS w/c with chair alarm belt and seatbelt fastened. Mother present, reporting GF will visit today.   Therapy Documentation Precautions:  Precautions Precautions: Fall,Posterior Hip Precaution Booklet Issued: No Precaution Comments: PEG, posterior hip precautions Restrictions Weight Bearing Restrictions: Yes RUE Weight Bearing: Weight bearing as  tolerated LUE Weight Bearing: Non weight bearing LLE Weight Bearing: Non weight bearing   Therapy/Group: Individual Therapy  Crissie Reese 05/13/2020, 6:24 AM

## 2020-05-13 NOTE — Progress Notes (Signed)
Physical Therapy Session Note  Patient Details  Name: Macario D Mudrick Jr. MRN: [id number] Date of Birth: 05/06/1995  Today's Date: 05/13/2020 PT Individual Time: 1015-1100 PT Individual Time Calculation (min): 45 min   Short Term Goals: Week 3:  PT Short Term Goal 1 (Week 3): Pt will perform supine<>sit with mod assist of 1 person consistently PT Short Term Goal 2 (Week 3): Pt will perform bed<>chair transfers with mod assist of 1 person consistently in both directions PT Short Term Goal 3 (Week 3): Pt will perform sit<>stands using R UE support and mod assist of 1 person consistently PT Short Term Goal 4 (Week 3): Pt will propel wheelchair 50ft with supervision  Skilled Therapeutic Interventions/Progress Updates:     Pt greeted seated in TIS w/c with mother at bedside - pt agreeable to therapy session and reports mild L arm pain. Rest breaks and repositioning provided for pain management. Mother assisting with donning his [employer] sweatshirt but encouraged patient to do himself which he was able to do with minA, primarily for threading his LUE and pulling over his back. W/c transport to main rehab gym for time management. Pt able to recall 1/3 posterior hip precautions (no crossing legs) and both of his WBing precautions on L side. Placed his L foot on top of therapists foot to monitor during functional activity. Squat<>pivot with minA from W/c to mat table - needing cues to comply with LUE WBing as he wants to push off arm rest. With small therapy ball - he used his RUE to shoot onto 6foot basketball goal that was placed ~6ft in front of him, using his LUE to help guide. He was successful 2/5 shots and was pleased with this. Then participated in horseshoe toss in both seated and standing position. In seated position, able to complete with SBA for balance. Required minA for standing balance while tossing the horseshoes. Also performed repeated sit<>stands and functional reaching, grabbing horseshoes  off of elevated basketball rim - needing minA for balance and mod cues for LLE WBing compliance. Squat<>pivot with modA back to his w/c and he was returned to his room. Pt's girlfriend in the room when we returned - pt immediately became quite (he was very talkative and happy during session) and tearful. Per his mother, this was the 1st time they've met since admission. He remained tilted in TIS w/c with safety belt alarm on and needs within reach.   Therapy Documentation Precautions:  Precautions Precautions: Fall,Posterior Hip Precaution Booklet Issued: No Precaution Comments: PEG, posterior hip precautions Restrictions Weight Bearing Restrictions: Yes RUE Weight Bearing: Weight bearing as tolerated LUE Weight Bearing: Non weight bearing LLE Weight Bearing: Non weight bearing General:    Therapy/Group: Individual Therapy  Christian P Manhard PT 05/13/2020, 7:53 AM  

## 2020-05-13 NOTE — Progress Notes (Signed)
Occupational Therapy Session Note  Patient Details  Name: Jeremy George. MRN: [id number] Date of Birth: May 17, 1995  Today's Date: 05/13/2020 OT Individual Time: 1345-1430 OT Individual Time Calculation (min): 45 min    Short Term Goals: Week 1:  OT Short Term Goal 1 (Week 1): Pt will sit EOB with max A during ADLs OT Short Term Goal 1 - Progress (Week 1): Met OT Short Term Goal 2 (Week 1): Pt will attend to oral care task with no more than mod cueing OT Short Term Goal 2 - Progress (Week 1): Met OT Short Term Goal 3 (Week 1): Pt will wash UB with max A OT Short Term Goal 3 - Progress (Week 1): Met  Skilled Therapeutic Interventions/Progress Updates:    1:1. Pt received in bed with mother and girlfriend present. Pt completes stand pivot transfer with MOD A and VC for lateral scoot to EOB to decrease need to pivot on R toes. Pt completes 3x throughotu session to TIS or EOB. Pt completes toileting with urinal with set up/clean up. Pt able ot complete trail making A test with 4 errors and only able to complete 5-e on trail making B. Pt often begins drawing line prior to locating next target getting confused demo poor planning. Pt completes seated basketball toss and pt intermittently distracted by music and begins dancing. Pt requires intermittent cuing for hip precautions/TDWB during transfers as well as when seated in TIS pt attempts to put LLE onto calf rest of leg rest increasing hip flexion. Exited session with pt seated in TIS direct handoff to SLP, exit alarm on and call light in reach   Therapy Documentation Precautions:  Precautions Precautions: Fall,Posterior Hip Precaution Booklet Issued: No Precaution Comments: PEG, posterior hip precautions Restrictions Weight Bearing Restrictions: Yes RUE Weight Bearing: Weight bearing as tolerated LUE Weight Bearing: Non weight bearing LLE Weight Bearing: Non weight bearing General:   Vital Signs: Therapy Vitals Temp: 98.2 F  (36.8 C) Temp Source: Oral Pulse Rate: 91 Resp: 18 BP: 115/73 Patient Position (if appropriate): Lying Oxygen Therapy O2 Device: Room Air Pain:   ADL: ADL Eating: Dependent Where Assessed-Eating: Edge of bed Grooming: Maximal assistance Where Assessed-Grooming: Edge of bed Upper Body Bathing: Unable to assess Lower Body Bathing: Unable to assess Upper Body Dressing: Unable to assess Lower Body Dressing: Unable to assess Toileting: Unable to assess Toilet Transfer: Unable to assess Tub/Shower Transfer: Unable to assess Gaffer Transfer: Unable to assess Social research officer, government Method: Unable to assess Vision   Perception    Praxis   Exercises:   Other Treatments:     Therapy/Group: Individual Therapy  Tonny Branch 05/13/2020, 7:00 AM
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
## 2020-05-13 NOTE — Progress Notes (Signed)
Speech Language Pathology Weekly Progress and Session Note  Patient Details  Name: Jeremy George. MRN: [id number] Date of Birth: Jan 11, 1996  Beginning of progress report period: May 06, 2020 End of progress report period: May 13, 2020  Today's Date: 05/13/2020 SLP Individual Time: 1430-1500 SLP Individual Time Calculation (min): 30 min  Short Term Goals: Week 2: SLP Short Term Goal 1 (Week 2): Patient will utilize speech intelligibility strategies at the phrase level to achieve ~80% intelligibility with Max A multimodal cues. SLP Short Term Goal 1 - Progress (Week 2): Met SLP Short Term Goal 2 (Week 2): Patient will demonstrate sustained attention to functional tasks for 10 mintues with Mod verbal cues for redirection. SLP Short Term Goal 2 - Progress (Week 2): Met SLP Short Term Goal 3 (Week 2): Patient will utilize external aids for orientation X 4 with Min A multimodal cues. SLP Short Term Goal 3 - Progress (Week 2): Met SLP Short Term Goal 4 (Week 2): Patient will consume current diet of Dys. 1 textures with thin liquids wiht minimal overt s/s of aspiration and overall Min A verbal cues for use of swallow strategies. SLP Short Term Goal 4 - Progress (Week 2): Met SLP Short Term Goal 5 (Week 2): Patient will demonstrate efficient mastication and complete oral clearance with trials of Dys. 2 textures without overt s/s of aspiration and Min verbal cues over 2 sessions prior to upgrade. SLP Short Term Goal 5 - Progress (Week 2): Met    New Short Term Goals: Week 3: SLP Short Term Goal 1 (Week 3): Patient will utilize speech intelligibility strategies at the phrase level to achieve ~90% intelligibility with Min A multimodal cues. SLP Short Term Goal 2 (Week 3): Patient will demonstrate sustained attention to functional tasks for 10 mintues with Min verbal cues for redirection. SLP Short Term Goal 3 (Week 3): Patient will demonstrate functional problem solving for basic and  familiar tasks with Mod A verbal cues. SLP Short Term Goal 4 (Week 3): Patient will recall 1 event/piece of relevant infromation daily with Mod A verbal cues. SLP Short Term Goal 5 (Week 3): Patient will consume current diet with minimal overt s/s of aspiration and Mod verbal cues for use of swallowing compensatory strategies.  Weekly Progress Updates: Patient continues to make functional gains and has met  5 of 5 STGs this reporting period. Currently, patient demonstrates behaviors consistent with a Rancho Level VI and requires overall Mod-Max A multimodal cues to complete functional and familiar tasks safely in regards to problem solving, recall, attention and awareness. Patient also demonstrates improved swallowing function and is consuming Dys. 2 textures with thin liquids with minimal overt s/s of aspiration and Mod verbal cues needed for use of swallowing compensatory strategies. Patient's overall speech intelligibility has also improved to ~80% at the phrase level with increased use of speech strategies. Patient and family education ongoing. Patient would benefit from continued skilled SLP intervention to maximize his cognitive, swallowing and speech function prior to discharge.      Intensity: Minumum of 1-2 x/day, 30 to 90 minutes Frequency: 3 to 5 out of 7 days Duration/Length of Stay: 06/02/20 Treatment/Interventions: Dysphagia/aspiration precaution training;Therapeutic Activities;Environmental controls;Cueing hierarchy;Functional tasks;Patient/family education;Cognitive remediation/compensation;Speech/Language facilitation;Internal/external aids   Daily Session  Skilled Therapeutic Interventions: Skilled treatment session focused on speech and cognitive goals. SLP facilitated session by providing overall supervision level verbal cues for use of speech intelligibility strategies at the sentence level. During a verbal description task, patient required overall Min  A verbal cues for verbal  reasoning regarding description and specificity of the noun. SLP provided praise to patient regarding level of attention and overall engagement with task. Patient had a sudden change in mood and initiated conversation regarding importance of trust to allow him to "open up." Also discussed family dynamics that sometimes lead to sadness. SLP provided support. Patient left upright in wheelchair with girlfriend present. Continue with current plan of care.   Pain No/Denies Pain   Therapy/Group: Individual Therapy  Jeremy George 05/13/2020, 6:24 AM
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
## 2020-05-14 ENCOUNTER — Inpatient Hospital Stay (HOSPITAL_COMMUNITY): Payer: Medicaid Other

## 2020-05-14 MED ORDER — CHLORDIAZEPOXIDE HCL 5 MG PO CAPS
10.0000 mg | ORAL_CAPSULE | Freq: Two times a day (BID) | ORAL | Status: DC
  Administered 2020-05-14 – 2020-05-16 (×3): 10 mg
  Filled 2020-05-14 (×3): qty 2

## 2020-05-14 MED ORDER — CITALOPRAM HYDROBROMIDE 10 MG PO TABS
10.0000 mg | ORAL_TABLET | Freq: Every day | ORAL | Status: DC
  Administered 2020-05-14 – 2020-05-15 (×2): 10 mg
  Filled 2020-05-14 (×2): qty 1

## 2020-05-14 NOTE — Progress Notes (Signed)
Patient's peg tube came out. MD on call was consulted. Orders given to replace with a foley catheter, no xray needed to verify placement. When catheter arrived, foley was placed with some resistance. Patient tolerated it well. His nurse was present for the procedure. Abdominal binder was in place. Patient went back to sleep. No acute distress noted. Left in care of his nurse.
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
## 2020-05-14 NOTE — Progress Notes (Signed)
Speech Language Pathology Daily Session Note  Patient Details  Name: Jeremy George. MRN: [id number] Date of Birth: March 01, 1995  Today's Date: 05/14/2020 SLP Individual Time: 1300-1345 SLP Individual Time Calculation (min): 45 min  Short Term Goals: Week 3: SLP Short Term Goal 1 (Week 3): Patient will utilize speech intelligibility strategies at the phrase level to achieve ~90% intelligibility with Min A multimodal cues. SLP Short Term Goal 2 (Week 3): Patient will demonstrate sustained attention to functional tasks for 10 mintues with Min verbal cues for redirection. SLP Short Term Goal 3 (Week 3): Patient will demonstrate functional problem solving for basic and familiar tasks with Mod A verbal cues. SLP Short Term Goal 4 (Week 3): Patient will recall 1 event/piece of relevant infromation daily with Mod A verbal cues. SLP Short Term Goal 5 (Week 3): Patient will consume current diet with minimal overt s/s of aspiration and Mod verbal cues for use of swallowing compensatory strategies.  Skilled Therapeutic Interventions: Skilled treatment session focused on cognitive goals. Upon arrival, patient was supine in bed and requested to get into the chair. Patient required overall Min verbal cues for recall of technique. SLP facilitated session with a memory/nabigation task around the unit. Patient independently recalled all 4 items and located them with only supervision level verbal cues needed for problem solving. Patient left upright in tilt-in-space wheelchair with PT present. Continue with current plan of care.      Pain No/Denies Pain   Therapy/Group: Individual Therapy  Deric Bocock 05/14/2020, 3:17 PM

## 2020-05-14 NOTE — Progress Notes (Signed)
PT PEG tube came out-advised charge nurse , who contacted on call for consult. Order given to replace w/ foley catheter. Completed.

## 2020-05-14 NOTE — H&P (View-Only) (Signed)
Went to evaluate patient to discuss planned MMF/mandibular hardware removal next week with Dr. Pace on 05/18/2020.  Patient was using the bathroom, will return tomorrow afternoon or Monday a.m. to discuss. 

## 2020-05-14 NOTE — Progress Notes (Signed)
Physical Therapy Session Note  Patient Details  Name: Tramar Brueckner. MRN: [id number] Date of Birth: [birth year]  Today's Date: 05/14/2020 PT Individual Time: 1107-1205 and 1610-1640 PT Individual Time Calculation (min): 58 min and 30 min  Short Term Goals: Week 3:  PT Short Term Goal 1 (Week 3): Pt will perform supine<>sit with mod assist of 1 person consistently PT Short Term Goal 2 (Week 3): Pt will perform bed<>chair transfers with mod assist of 1 person consistently in both directions PT Short Term Goal 3 (Week 3): Pt will perform sit<>stands using R UE support and mod assist of 1 person consistently PT Short Term Goal 4 (Week 3): Pt will propel wheelchair 51ft with supervision  Skilled Therapeutic Interventions/Progress Updates:    Session 1: Pt received supine in bed with his mother present and pt agreeable to therapy session. Pt overall appears in good spirits today and is much more vocal and engaged in conversations and therapy tasks. Supine in bed donned B LE socks and shoes max assist with pt demoing increased attention to task and lifting LEs to assist with task completion. Supine>sitting R EOB, HOB flat and not using bedrail, with close supervision and pt maintaining L LE hip precautions (believe this may have been coincidental based on how he chose to move as opposed to conscious effort to maintain precuations). Educated pt's mother on ensuring he gets OOB on the R side at home to assist with maintaining L LE WBing and posterior hip precautions. Sit>stand EOB>R UE support on hemiwalker, continuing to use L UE around therapist's shoulders - min assist for lifting and balance with continued cuing to maintain L LE precautions. Pt demos improved upright posture and good static standing balance. Advanced to performing L stand pivot EOB>w/c using R hemiwalker with mod assist for balance while turning and pt having difficulty problem solving and motor planning this new transfer technique  - requires +2 assist to bring w/c closer after pivoting as pt unable to shuffle R LE backwards to get hips closer to chair prior to sitting. R hemi-technique wheelchair propulsion ~122ft including turning with supervision and mod cuing - with max assist and education on how to set-up wheelchair next to EOM in preparation for transfer (will require reinforcement of this education with additional practice).  Block practice stand pivot transfer w/c<>EOM x3 using R hemiwalker for balance - pt requires mod assist throughout, though decreasing with increased repetitions - provided pt specific sequenced cuing for the following: 1. Kicking L LE out like a "kick stand"  2. L UE around therapist's shoulders  3. Pushing up with R UE from w/c arm rest or mat 4 Sanding up 5. Putting R hand on hemiwalker 6. Shuffling on R foot or as pt states he is "like a ballerina" twirling 7. Kick LLE out again like a "kick stand" prior to initiating sit (pt with significant difficulty understanding the purpose of this in order to maintain WBing precautions) Therapist providing step-by-step 1-command cuing to sequence transfer and by 3rd rep of block practice pt demonstrating significant improvement in motor planning requiring decreased cues. Of note: pt's shoe on R foot had significant amount of grip making it challenging to pivot therefore provided a small amount of the shoe cover over front/toe portion of his shoe to decrease friction  Transferring towards L side pt unable to shuffle R foot backwards in standing despite hemiwalker support and mod assist on L side therefore +2 assist needed to bring w/c closer to patient  prior to initiating sitting. Therapist providing visual demonstration and education on transfer technique with pt responding well to this type of cuing/education.  Pt reporting he feels that the transfers are becoming more "fluid" - therapist anticipates that with this success/accomplishment it is what led to  pt starting to discuss wanting to go home. Therapist reoriented pt to upcoming surgeries and therapy POC with option to discuss and write down his long term goals with the pt to provide tangible goals for him to work towards to help him gauge his progress towards meeting those goals.  Sit<>stands EOM<>R hemiwalker min/mod assist for lifting/lowering and continued cuing to "kick out L LE" and push up from mat with R UE. Standing balance and tolerance activity with R hemiwalker support while targeting L LE hip strengthening activity via L foot taps on/off 6" height step to 3 different external targets to promote increased hip abductor vs adductor activation (ensuring maintaining hip precautions). L stand pivot EOM>TIS w/c using R hemiwalker with mod assist and cuing/sequencing as described above. Transported back to room and pt left seated tilted back in TIS w/c with needs in reach, seat belt alarm on, and his mother present.    Session 2: Pt received sitting in TIS w/c with his mother present at sink completing oral care. Pt agreeable to therapy session though perseverating on something that his father said to him on the phone earlier - pt able to be redirected throughout session onto the therapeutic task at hand though often circling conversation back to this topic or on wanting to go home - demonstrating significant improvement in ability to redirect attention with decreased perseveration. Transported to/from gym in w/c for time management and energy conservation. Sit>stand w/c>R hemiwalker light mod assist for lifting to stand - cuing as above for set-up and to maintain L LE WBing restrictions. Standing with R hemiwalker performing L LE strengthening and dynamic standing balance task via tapping 3 different cones with L foot on verbal command - able to complete with mod assist due to L lean. Transported back to room in w/c. R stand pivot w/c>EOB using R hemiwalker (using same set-up and sequencing cuing as  above) with mod assist for lifting and balance while pivoting - pt initially started to perform squat pivot but was able to come upright to complete stand pivot with cuing. Pt initially demonstrating slight resistance to wanting to lie down but while carrying on a conversation spontaneously initiated sit>supine transfer requiring min assist for LE management. In supine, continues to demonstrate lacking ~10-20degrees of L knee extension performed x10reps quad sets 5 sec hold. Performed L LE AAROM hip abduction/adduction ROM with pt able to tolerate slightly more abduction compared to previously x8 reps. Pt left supine in bed with blanket roll under L heel to provide low load, long duration stretch into knee extension with pt's mother instructed to remove after or to pt's tolerance. Pt left supine in bed with needs in reach, posey belt alarm on, pt's mother present, and RN arriving.  Therapy Documentation Precautions:  Precautions Precautions: Fall,Posterior Hip Precaution Booklet Issued: No Precaution Comments: PEG, posterior hip precautions Restrictions Weight Bearing Restrictions: Yes RUE Weight Bearing: Weight bearing as tolerated LUE Weight Bearing: Non weight bearing LLE Weight Bearing: Non weight bearing  Pain:   Session 1: Reports L wrist and elbow pain, unrated, and reports "it's OK." Therapist ensured maintaining WBing restrictions and repositioned/modified transfer techniques to avoid increased pian.  Session 2: Denies pain though wearing lidocaine patch  on L knee - pt's mother reports he was complaining of L knee pain earlier today.   Therapy/Group: Individual Therapy  Ginny Forth , PT, DPT, CSRS  05/14/2020, 7:57 AM

## 2020-05-14 NOTE — Progress Notes (Signed)
Went to evaluate patient to discuss planned MMF/mandibular hardware removal next week with Dr. Arita Miss on 05/18/2020.  Patient was using the bathroom, will return tomorrow afternoon or Monday a.m. to discuss.

## 2020-05-14 NOTE — Progress Notes (Signed)
Occupational Therapy Session Note  Patient Details  Name: Jeremy George. MRN: [id number] Date of Birth: 1995/10/08  Today's Date: 05/14/2020 OT Individual Time: 4818-5631 OT Individual Time Calculation (min): 57 min    Short Term Goals: Week 1:  OT Short Term Goal 1 (Week 1): Pt will sit EOB with max A during ADLs OT Short Term Goal 1 - Progress (Week 1): Met OT Short Term Goal 2 (Week 1): Pt will attend to oral care task with no more than mod cueing OT Short Term Goal 2 - Progress (Week 1): Met OT Short Term Goal 3 (Week 1): Pt will wash UB with max A OT Short Term Goal 3 - Progress (Week 1): Met  Skilled Therapeutic Interventions/Progress Updates:    1:1. Pt received in bed requesting to shower. Pt completes MOD A transfers to/from roll in shower chair with LUE over OT shoulders and VC for R hand placement. Pt completes bathing with A to wash LUE, back and B feet. Pt able ot sequence better this date than previous shower with MOD VC for moving from LEs to UB. Pt completes dressing EOB with S for shirt threading LUE first with no cuing and MAX A to don pants. Pt uses hemi walker sit to stand at EOB with VC for hand placement. Pt returns to w/c to groom at sink in same manner as Jethro Poling however transport arrives to take pt to X ray therefore pt transfers back to bed. Exited session with pt seated in bed, exit alarm on [employer] of transport  Therapy Documentation Precautions:  Precautions Precautions: Fall,Posterior Hip Precaution Booklet Issued: No Precaution Comments: PEG, posterior hip precautions Restrictions Weight Bearing Restrictions: Yes RUE Weight Bearing: Weight bearing as tolerated LUE Weight Bearing: Non weight bearing LLE Weight Bearing: Non weight bearing General:   Vital Signs: Therapy Vitals Temp: 98.3 F (36.8 C) Temp Source: Oral Pulse Rate: 90 Resp: 16 BP: 121/72 Patient Position (if appropriate): Lying Oxygen Therapy SpO2: 100 % O2 Device: Room  Air Pain:   ADL: ADL Eating: Dependent Where Assessed-Eating: Edge of bed Grooming: Maximal assistance Where Assessed-Grooming: Edge of bed Upper Body Bathing: Unable to assess Lower Body Bathing: Unable to assess Upper Body Dressing: Unable to assess Lower Body Dressing: Unable to assess Toileting: Unable to assess Toilet Transfer: Unable to assess Tub/Shower Transfer: Unable to assess Gaffer Transfer: Unable to assess Social research officer, government Method: Unable to assess Vision   Perception    Praxis   Exercises:   Other Treatments:     Therapy/Group: Individual Therapy  Tonny Branch 05/14/2020, 6:49 AM

## 2020-05-14 NOTE — Progress Notes (Signed)
Patient ID: Jeremy George., male   DOB: 12-30-1995, 25 y.o.   MRN: [id number]  SW spoke with [name] who reports she is no longer over the TBI program (when it was under [employer]). States she will send email for TBI application and contact information for current TBI coordinator.   SW left message for [name] [phone] to discuss more about TBI program with the change in [facility] and process.   SW emailed TBI application and supporting documentation to- [email or web address] .  Cecile Sheerer, MSW, LCSWA Office: [phone] Cell: [phone] Fax: [phone]

## 2020-05-15 ENCOUNTER — Inpatient Hospital Stay (HOSPITAL_COMMUNITY): Payer: Medicaid Other | Admitting: Registered Nurse

## 2020-05-15 ENCOUNTER — Inpatient Hospital Stay (HOSPITAL_COMMUNITY): Payer: Medicaid Other

## 2020-05-15 ENCOUNTER — Encounter (HOSPITAL_COMMUNITY)
Admission: RE | Disposition: A | Discharge status: Home Health | Payer: Self-pay | Source: Intra-hospital | Attending: Physical Medicine & Rehabilitation

## 2020-05-15 HISTORY — PX: ORIF HUMERUS FRACTURE: SHX2126

## 2020-05-15 SURGERY — OPEN REDUCTION INTERNAL FIXATION (ORIF) DISTAL HUMERUS FRACTURE
Anesthesia: General | Site: Arm Upper | Laterality: Left

## 2020-05-15 MED ORDER — MIDAZOLAM HCL 5 MG/5ML IJ SOLN
INTRAMUSCULAR | Status: DC | PRN
  Administered 2020-05-15: 2 mg via INTRAVENOUS

## 2020-05-15 MED ORDER — FENTANYL CITRATE (PF) 250 MCG/5ML IJ SOLN
INTRAMUSCULAR | Status: AC
  Filled 2020-05-15: qty 5

## 2020-05-15 MED ORDER — ROCURONIUM BROMIDE 10 MG/ML (PF) SYRINGE
PREFILLED_SYRINGE | INTRAVENOUS | Status: DC | PRN
  Administered 2020-05-15: 90 mg via INTRAVENOUS

## 2020-05-15 MED ORDER — VANCOMYCIN HCL 1000 MG IV SOLR
INTRAVENOUS | Status: DC | PRN
  Administered 2020-05-15: 1000 mg

## 2020-05-15 MED ORDER — LACTATED RINGERS IV SOLN: INTRAVENOUS | Status: DC | PRN

## 2020-05-15 MED ORDER — CHLORHEXIDINE GLUCONATE CLOTH 2 % EX PADS
6.0000 | MEDICATED_PAD | Freq: Every day | CUTANEOUS | Status: DC
  Administered 2020-05-18 – 2020-05-20 (×3): 6 via TOPICAL

## 2020-05-15 MED ORDER — ALBUMIN HUMAN 5 % IV SOLN: INTRAVENOUS | Status: DC | PRN

## 2020-05-15 MED ORDER — PROPOFOL 10 MG/ML IV BOLUS
INTRAVENOUS | Status: DC | PRN
  Administered 2020-05-15: 200 mg via INTRAVENOUS

## 2020-05-15 MED ORDER — ONDANSETRON HCL 4 MG/2ML IJ SOLN
INTRAMUSCULAR | Status: AC
  Filled 2020-05-15: qty 2

## 2020-05-15 MED ORDER — LIDOCAINE 2% (20 MG/ML) 5 ML SYRINGE
INTRAMUSCULAR | Status: AC
  Filled 2020-05-15: qty 5

## 2020-05-15 MED ORDER — MIDAZOLAM HCL 2 MG/2ML IJ SOLN
INTRAMUSCULAR | Status: AC
  Filled 2020-05-15: qty 2

## 2020-05-15 MED ORDER — HYDROMORPHONE HCL 1 MG/ML IJ SOLN: 0.2500 mg | INTRAMUSCULAR | Status: DC | PRN

## 2020-05-15 MED ORDER — DEXMEDETOMIDINE (PRECEDEX) IN NS 20 MCG/5ML (4 MCG/ML) IV SYRINGE
PREFILLED_SYRINGE | INTRAVENOUS | Status: DC | PRN
  Administered 2020-05-15: 8 ug via INTRAVENOUS
  Administered 2020-05-15: 4 ug via INTRAVENOUS
  Administered 2020-05-15: 8 ug via INTRAVENOUS

## 2020-05-15 MED ORDER — VANCOMYCIN HCL 1000 MG IV SOLR
INTRAVENOUS | Status: AC
  Filled 2020-05-15: qty 1000

## 2020-05-15 MED ORDER — HYDROMORPHONE HCL 2 MG PO TABS
2.0000 mg | ORAL_TABLET | Freq: Four times a day (QID) | ORAL | Status: DC | PRN
  Administered 2020-05-16: 2 mg
  Filled 2020-05-15: qty 1

## 2020-05-15 MED ORDER — AMISULPRIDE (ANTIEMETIC) 5 MG/2ML IV SOLN: 10.0000 mg | Freq: Once | INTRAVENOUS | Status: DC | PRN

## 2020-05-15 MED ORDER — ROPIVACAINE HCL 5 MG/ML IJ SOLN
INTRAMUSCULAR | Status: DC | PRN
  Administered 2020-05-15: 25 mL via PERINEURAL

## 2020-05-15 MED ORDER — FENTANYL CITRATE (PF) 250 MCG/5ML IJ SOLN
INTRAMUSCULAR | Status: DC | PRN
  Administered 2020-05-15: 100 ug via INTRAVENOUS
  Administered 2020-05-15 (×2): 50 ug via INTRAVENOUS
  Administered 2020-05-15: 100 ug via INTRAVENOUS

## 2020-05-15 MED ORDER — ONDANSETRON HCL 4 MG/2ML IJ SOLN
INTRAMUSCULAR | Status: DC | PRN
  Administered 2020-05-15: 4 mg via INTRAVENOUS

## 2020-05-15 MED ORDER — 0.9 % SODIUM CHLORIDE (POUR BTL) OPTIME
TOPICAL | Status: DC | PRN
  Administered 2020-05-15: 1000 mL

## 2020-05-15 MED ORDER — DEXAMETHASONE SODIUM PHOSPHATE 10 MG/ML IJ SOLN
INTRAMUSCULAR | Status: DC | PRN
  Administered 2020-05-15: 10 mg via INTRAVENOUS

## 2020-05-15 MED ORDER — SUGAMMADEX SODIUM 200 MG/2ML IV SOLN
INTRAVENOUS | Status: DC | PRN
  Administered 2020-05-15 (×2): 100 mg via INTRAVENOUS

## 2020-05-15 MED ORDER — ROCURONIUM BROMIDE 10 MG/ML (PF) SYRINGE
PREFILLED_SYRINGE | INTRAVENOUS | Status: AC
  Filled 2020-05-15: qty 10

## 2020-05-15 MED ORDER — OXYCODONE HCL 5 MG PO TABS
5.0000 mg | ORAL_TABLET | Freq: Four times a day (QID) | ORAL | Status: DC | PRN
  Administered 2020-05-15 – 2020-05-16 (×3): 5 mg
  Filled 2020-05-15 (×3): qty 1

## 2020-05-15 MED ORDER — CEFAZOLIN SODIUM-DEXTROSE 2-4 GM/100ML-% IV SOLN
2.0000 g | Freq: Three times a day (TID) | INTRAVENOUS | Status: AC
  Administered 2020-05-16 (×3): 2 g via INTRAVENOUS
  Filled 2020-05-15 (×3): qty 100

## 2020-05-15 MED ORDER — CEFAZOLIN SODIUM-DEXTROSE 2-3 GM-%(50ML) IV SOLR
INTRAVENOUS | Status: DC | PRN
  Administered 2020-05-15: 2 g via INTRAVENOUS

## 2020-05-15 MED ORDER — LIDOCAINE 2% (20 MG/ML) 5 ML SYRINGE
INTRAMUSCULAR | Status: DC | PRN
  Administered 2020-05-15: 80 mg via INTRAVENOUS

## 2020-05-15 MED ORDER — DEXAMETHASONE SODIUM PHOSPHATE 10 MG/ML IJ SOLN
INTRAMUSCULAR | Status: AC
  Filled 2020-05-15: qty 1

## 2020-05-15 MED ORDER — PROPOFOL 10 MG/ML IV BOLUS
INTRAVENOUS | Status: AC
  Filled 2020-05-15: qty 20

## 2020-05-15 MED ORDER — PROMETHAZINE HCL 25 MG/ML IJ SOLN: 6.2500 mg | INTRAMUSCULAR | Status: DC | PRN

## 2020-05-15 SURGICAL SUPPLY — 78 items
BLADE AVERAGE 25X9 (BLADE) IMPLANT
BNDG CMPR 9X4 STRL LF SNTH (GAUZE/BANDAGES/DRESSINGS)
BNDG COHESIVE 4X5 TAN STRL (GAUZE/BANDAGES/DRESSINGS) ×2 IMPLANT
BNDG ELASTIC 3X5.8 VLCR STR LF (GAUZE/BANDAGES/DRESSINGS) ×1 IMPLANT
BNDG ELASTIC 4X5.8 VLCR STR LF (GAUZE/BANDAGES/DRESSINGS) ×1 IMPLANT
BNDG ESMARK 4X9 LF (GAUZE/BANDAGES/DRESSINGS) IMPLANT
BNDG GAUZE ELAST 4 BULKY (GAUZE/BANDAGES/DRESSINGS) ×4 IMPLANT
BONE CANC CHIPS 40CC CAN1/2 (Bone Implant) ×2 IMPLANT
BRUSH SCRUB EZ PLAIN DRY (MISCELLANEOUS) ×4 IMPLANT
CHIPS CANC BONE 40CC CAN1/2 (Bone Implant) ×1 IMPLANT
CLEANER TIP ELECTROSURG 2X2 (MISCELLANEOUS) ×2 IMPLANT
CORD BIPOLAR FORCEPS 12FT (ELECTRODE) ×2 IMPLANT
COVER SURGICAL LIGHT HANDLE (MISCELLANEOUS) ×2 IMPLANT
COVER WAND RF STERILE (DRAPES) ×2 IMPLANT
DRAIN PENROSE 1/4X12 LTX STRL (WOUND CARE) IMPLANT
DRAIN PENROSE 18X1/4 LTX STRL (DRAIN) ×1 IMPLANT
DRAPE C-ARM 42X72 X-RAY (DRAPES) ×2 IMPLANT
DRAPE C-ARMOR (DRAPES) IMPLANT
DRAPE HALF SHEET 40X57 (DRAPES) ×2 IMPLANT
DRAPE INCISE IOBAN 66X45 STRL (DRAPES) IMPLANT
DRAPE ORTHO SPLIT 77X108 STRL (DRAPES) ×2
DRAPE SURG ORHT 6 SPLT 77X108 (DRAPES) ×1 IMPLANT
DRAPE U-SHAPE 47X51 STRL (DRAPES) ×2 IMPLANT
DRESSING MEPILEX FLEX 4X4 (GAUZE/BANDAGES/DRESSINGS) IMPLANT
DRSG ADAPTIC 3X8 NADH LF (GAUZE/BANDAGES/DRESSINGS) ×2 IMPLANT
DRSG MEPILEX FLEX 4X4 (GAUZE/BANDAGES/DRESSINGS) ×2
DRSG MEPITEL 4X7.2 (GAUZE/BANDAGES/DRESSINGS) ×1 IMPLANT
DRSG PAD ABDOMINAL 8X10 ST (GAUZE/BANDAGES/DRESSINGS) ×2 IMPLANT
ELECT REM PT RETURN 9FT ADLT (ELECTROSURGICAL) ×2
ELECTRODE REM PT RTRN 9FT ADLT (ELECTROSURGICAL) ×1 IMPLANT
EVACUATOR 1/8 PVC DRAIN (DRAIN) IMPLANT
GAUZE SPONGE 4X4 12PLY STRL (GAUZE/BANDAGES/DRESSINGS) ×4 IMPLANT
GAUZE SPONGE 4X4 12PLY STRL LF (GAUZE/BANDAGES/DRESSINGS) ×1 IMPLANT
GLOVE BIO SURGEON STRL SZ 6.5 (GLOVE) ×6 IMPLANT
GLOVE BIO SURGEON STRL SZ7.5 (GLOVE) ×6 IMPLANT
GLOVE BIOGEL PI IND STRL 7.5 (GLOVE) ×1 IMPLANT
GLOVE BIOGEL PI INDICATOR 7.5 (GLOVE) ×1
GLOVE SURG UNDER POLY LF SZ6.5 (GLOVE) ×2 IMPLANT
GOWN STRL REUS W/ TWL LRG LVL3 (GOWN DISPOSABLE) ×3 IMPLANT
GOWN STRL REUS W/ TWL XL LVL3 (GOWN DISPOSABLE) ×1 IMPLANT
GOWN STRL REUS W/TWL LRG LVL3 (GOWN DISPOSABLE) ×6
GOWN STRL REUS W/TWL XL LVL3 (GOWN DISPOSABLE) ×2
GRAFT BNE CHIP CANC 1-8 40 (Bone Implant) IMPLANT
GUIDEWIRE 3.2X400 (WIRE) ×1 IMPLANT
KIT BASIN OR (CUSTOM PROCEDURE TRAY) ×2 IMPLANT
KIT BONE HARVEST RIA 2 (ORTHOPEDIC DISPOSABLE SUPPLIES) ×1 IMPLANT
KIT INFUSE LRG II (Orthopedic Implant) ×1 IMPLANT
KIT TURNOVER KIT B (KITS) ×2 IMPLANT
LOOP VESSEL MAXI BLUE (MISCELLANEOUS) ×1 IMPLANT
MANIFOLD NEPTUNE II (INSTRUMENTS) ×2 IMPLANT
NDL HYPO 25X1 1.5 SAFETY (NEEDLE) IMPLANT
NEEDLE HYPO 25X1 1.5 SAFETY (NEEDLE) IMPLANT
NS IRRIG 1000ML POUR BTL (IV SOLUTION) ×2 IMPLANT
PACK ORTHO EXTREMITY (CUSTOM PROCEDURE TRAY) ×2 IMPLANT
PAD ARMBOARD 7.5X6 YLW CONV (MISCELLANEOUS) ×4 IMPLANT
PAD CAST 3X4 CTTN HI CHSV (CAST SUPPLIES) IMPLANT
PAD CAST 4YDX4 CTTN HI CHSV (CAST SUPPLIES) IMPLANT
PADDING CAST COTTON 3X4 STRL (CAST SUPPLIES) ×2
PADDING CAST COTTON 4X4 STRL (CAST SUPPLIES) ×2
REAMER HEAD RIA2 14.5 (INSTRUMENTS) ×1 IMPLANT
REAMER ROD DEEP FLUTE 2.5X950 (INSTRUMENTS) ×1 IMPLANT
SPONGE LAP 18X18 RF (DISPOSABLE) IMPLANT
STAPLER VISISTAT 35W (STAPLE) IMPLANT
STOCKINETTE IMPERVIOUS 9X36 MD (GAUZE/BANDAGES/DRESSINGS) ×2 IMPLANT
SUCTION FRAZIER HANDLE 10FR (MISCELLANEOUS) ×2
SUCTION TUBE FRAZIER 10FR DISP (MISCELLANEOUS) ×1 IMPLANT
SUT ETHILON 3 0 PS 1 (SUTURE) ×4 IMPLANT
SUT VIC AB 0 CT1 27 (SUTURE) ×6
SUT VIC AB 0 CT1 27XBRD ANBCTR (SUTURE) ×2 IMPLANT
SUT VIC AB 2-0 CT1 27 (SUTURE) ×6
SUT VIC AB 2-0 CT1 TAPERPNT 27 (SUTURE) ×2 IMPLANT
SYR 5ML LL (SYRINGE) IMPLANT
SYR CONTROL 10ML LL (SYRINGE) ×2 IMPLANT
TOWEL GREEN STERILE (TOWEL DISPOSABLE) ×2 IMPLANT
TOWEL GREEN STERILE FF (TOWEL DISPOSABLE) ×2 IMPLANT
TRAY FOLEY MTR SLVR 16FR STAT (SET/KITS/TRAYS/PACK) IMPLANT
WATER STERILE IRR 1000ML POUR (IV SOLUTION) ×2 IMPLANT
YANKAUER SUCT BULB TIP NO VENT (SUCTIONS) IMPLANT

## 2020-05-15 NOTE — Anesthesia Procedure Notes (Signed)
Anesthesia Regional Block: Supraclavicular block   Pre-Anesthetic Checklist: ,, timeout performed, Correct Patient, Correct Site, Correct Laterality, Correct Procedure, Correct Position, site marked, Risks and benefits discussed,  Surgical consent,  Pre-op evaluation,  At surgeon's request and post-op pain management  Laterality: Left  Prep: chloraprep       Needles:  Injection technique: Single-shot  Needle Type: Echogenic Stimulator Needle     Needle Length: 10cm  Needle Gauge: 21     Additional Needles:   Procedures:,,,, ultrasound used (permanent image in chart),,,,  Narrative:  Start time: 05/15/2020 10:30 AM End time: 05/15/2020 10:35 AM Injection made incrementally with aspirations every 5 mL.  Performed by: Personally  Anesthesiologist: Mellody Dance, MD  Additional Notes: Functioning IV was confirmed and monitors applied.  A 62mm 22ga echogenic arrow stimulator was used. Sterile prep and drape,hand hygiene and sterile gloves were used.Ultrasound guidance: relevant anatomy identified, needle position confirmed, local anesthetic spread visualized around nerve(s)., vascular puncture avoided.  Image printed for medical record.  Negative aspiration and negative test dose prior to incremental administration of local anesthetic. The patient tolerated the procedure well.

## 2020-05-15 NOTE — Progress Notes (Addendum)
Patient ID: [name]., male   DOB: 08-01-1995, 25 y.o.   MRN: [id number]  SW received letter from pt mother Bradly Bienenstock stating pt short term disability requesting updates for an extension. SW faxed over letter from physician to [facility] [phone].  SW left message for [name] [phone] to discuss if application received.    Cecile Sheerer, MSW, LCSWA Office: [phone] Cell: [phone] Fax: [phone]
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
## 2020-05-15 NOTE — Anesthesia Procedure Notes (Signed)
Procedure Name: Intubation Date/Time: 05/15/2020 7:41 AM Performed by: Laruth Bouchard., CRNA Pre-anesthesia Checklist: Patient identified, Emergency Drugs available, Suction available, Patient being monitored and Timeout performed Patient Re-evaluated:Patient Re-evaluated prior to induction Oxygen Delivery Method: Circle system utilized Preoxygenation: Pre-oxygenation with 100% oxygen Induction Type: IV induction Ventilation: Mask ventilation without difficulty Laryngoscope Size: Glidescope and 4 Grade View: Grade I Tube type: Oral Tube size: 7.5 mm Number of attempts: 1 Airway Equipment and Method: Rigid stylet and Video-laryngoscopy Placement Confirmation: ETT inserted through vocal cords under direct vision,  positive ETCO2 and breath sounds checked- equal and bilateral Secured at: 22 cm Tube secured with: Tape Dental Injury: Teeth and Oropharynx as per pre-operative assessment  Comments: Elective glidescope use due to recent trach removal

## 2020-05-15 NOTE — Interval H&P Note (Signed)
History and Physical Interval Note:  05/15/2020 7:17 AM  Jeremy George.  has presented today for surgery, with the diagnosis of Left distal humerus nonunion.  The various methods of treatment have been discussed with the patient and family. After consideration of risks, benefits and other options for treatment, the patient has consented to  Procedure(s): REPAIR OF LEFT DISTAL HUMERUS NONUNION WITH RIA HARVEST (Left) as a surgical intervention.  The patient's history has been reviewed, patient examined, no change in status, stable for surgery.  I have reviewed the patient's chart and labs.  Questions were answered to the patient's satisfaction.     Caryn Bee P Karsyn Rochin
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
## 2020-05-15 NOTE — Transfer of Care (Signed)
Immediate Anesthesia Transfer of Care Note  Patient: Jeremy George.  Procedure(s) Performed: REPAIR OF LEFT DISTAL HUMERUS NONUNION WITH RIA HARVEST (Left Arm Upper)  Patient Location: PACU  Anesthesia Type:General  Level of Consciousness: awake, alert  and drowsy  Airway & Oxygen Therapy: Patient Spontanous Breathing and Patient connected to face mask oxygen  Post-op Assessment: Report given to RN and Post -op Vital signs reviewed and stable  Post vital signs: Reviewed and stable  Last Vitals:  Vitals Value Taken Time  BP 133/99 05/15/20 1102  Temp    Pulse    Resp 12 05/15/20 1105  SpO2    Vitals shown include unvalidated device data.  Last Pain:  Vitals:   05/15/20 0406  TempSrc: Oral  PainSc:       Patients Stated Pain Goal: 3 (05/13/20 1230)  Complications: No complications documented.
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
## 2020-05-16 DIAGNOSIS — Z931 Gastrostomy status: Secondary | ICD-10-CM

## 2020-05-16 DIAGNOSIS — T1490XA Injury, unspecified, initial encounter: Secondary | ICD-10-CM

## 2020-05-16 DIAGNOSIS — S069X3D Unspecified intracranial injury with loss of consciousness of 1 hour to 5 hours 59 minutes, subsequent encounter: Secondary | ICD-10-CM

## 2020-05-16 DIAGNOSIS — R1310 Dysphagia, unspecified: Secondary | ICD-10-CM

## 2020-05-16 MED ORDER — QUETIAPINE FUMARATE 50 MG PO TABS
50.0000 mg | ORAL_TABLET | Freq: Every morning | ORAL | Status: DC
  Administered 2020-05-17 – 2020-05-19 (×2): 50 mg via ORAL
  Filled 2020-05-16 (×2): qty 1

## 2020-05-16 MED ORDER — CHLORDIAZEPOXIDE HCL 5 MG PO CAPS
10.0000 mg | ORAL_CAPSULE | Freq: Two times a day (BID) | ORAL | Status: DC
  Administered 2020-05-16 – 2020-05-18 (×4): 10 mg via ORAL
  Filled 2020-05-16 (×4): qty 2

## 2020-05-16 MED ORDER — VALPROIC ACID 250 MG/5ML PO SOLN
750.0000 mg | Freq: Three times a day (TID) | ORAL | Status: DC
Start: 2020-05-16 — End: 2020-05-18
  Administered 2020-05-16 – 2020-05-18 (×6): 750 mg via ORAL
  Filled 2020-05-16 (×6): qty 15

## 2020-05-16 MED ORDER — MEGESTROL ACETATE 400 MG/10ML PO SUSP
400.0000 mg | Freq: Two times a day (BID) | ORAL | Status: DC
  Administered 2020-05-16 – 2020-05-25 (×19): 400 mg via ORAL
  Filled 2020-05-16 (×18): qty 10

## 2020-05-16 MED ORDER — ADULT MULTIVITAMIN W/MINERALS CH
1.0000 | ORAL_TABLET | Freq: Every day | ORAL | Status: DC
  Administered 2020-05-17 – 2020-06-02 (×17): 1 via ORAL
  Filled 2020-05-16 (×17): qty 1

## 2020-05-16 MED ORDER — OXYCODONE HCL 5 MG PO TABS
5.0000 mg | ORAL_TABLET | Freq: Four times a day (QID) | ORAL | Status: DC | PRN
  Administered 2020-05-16 – 2020-05-18 (×4): 5 mg via ORAL
  Filled 2020-05-16 (×4): qty 1

## 2020-05-16 MED ORDER — HYDROMORPHONE HCL 2 MG PO TABS
2.0000 mg | ORAL_TABLET | Freq: Four times a day (QID) | ORAL | Status: DC | PRN
  Administered 2020-05-16 – 2020-05-17 (×4): 2 mg via ORAL
  Filled 2020-05-16 (×6): qty 1

## 2020-05-16 MED ORDER — QUETIAPINE FUMARATE 200 MG PO TABS
400.0000 mg | ORAL_TABLET | Freq: Every day | ORAL | Status: DC
  Administered 2020-05-16 – 2020-05-18 (×3): 400 mg via ORAL
  Filled 2020-05-16 (×3): qty 2

## 2020-05-16 MED ORDER — ACETAMINOPHEN 325 MG PO TABS
325.0000 mg | ORAL_TABLET | ORAL | Status: DC | PRN
  Administered 2020-05-16 – 2020-05-21 (×6): 650 mg via ORAL
  Filled 2020-05-16 (×6): qty 2

## 2020-05-16 MED ORDER — METHOCARBAMOL 750 MG PO TABS
750.0000 mg | ORAL_TABLET | Freq: Three times a day (TID) | ORAL | Status: DC
  Administered 2020-05-16 – 2020-06-01 (×46): 750 mg via ORAL
  Filled 2020-05-16 (×48): qty 1

## 2020-05-16 MED ORDER — CITALOPRAM HYDROBROMIDE 10 MG PO TABS
10.0000 mg | ORAL_TABLET | Freq: Every day | ORAL | Status: DC
  Administered 2020-05-16 – 2020-06-01 (×17): 10 mg via ORAL
  Filled 2020-05-16 (×17): qty 1

## 2020-05-16 NOTE — Progress Notes (Signed)
Pt bleeding through bandage from surgical site at start of shift. Advised charge nurse, who stated to place a  Bed pad under to monitor amount. At this time, Charge Nurse states that d/t bleed through onto pad and sheet, to reinforce bandage w/ Kerlix.

## 2020-05-16 NOTE — Progress Notes (Signed)
Patel MD on call, notified of patient bleeding at surgical site.

## 2020-05-16 NOTE — Progress Notes (Signed)
Physical Therapy Session Note  Patient Details  Name: Jeremy George. MRN: [id number] Date of Birth: 1995-11-17  Today's Date: 05/16/2020 PT Individual Time: 1400-1500 PT Individual Time Calculation (min): 60 min   Short Term Goals: Week 2:  PT Short Term Goal 1 (Week 2): Patient will perform bed mobility with mod-max A of 1 person consistently. PT Short Term Goal 1 - Progress (Week 2): Met PT Short Term Goal 2 (Week 2): Patient will perform standing balance >1 min while maintaining weight bearing precautions with max A +2. PT Short Term Goal 2 - Progress (Week 2): Met PT Short Term Goal 3 (Week 2): Patient will perform basic transfers with max A +1 consistently. PT Short Term Goal 3 - Progress (Week 2): Met PT Short Term Goal 4 (Week 2): Patient will initiate manual w/c mobility. PT Short Term Goal 4 - Progress (Week 2): Met Week 3:  PT Short Term Goal 1 (Week 3): Pt will perform supine<>sit with mod assist of 1 person consistently PT Short Term Goal 2 (Week 3): Pt will perform bed<>chair transfers with mod assist of 1 person consistently in both directions PT Short Term Goal 3 (Week 3): Pt will perform sit<>stands using R UE support and mod assist of 1 person consistently PT Short Term Goal 4 (Week 3): Pt will propel wheelchair 76f with supervision      Skilled Therapeutic Interventions/Progress Updates:  Pt resting in bed.  He denied pain.    neuromuscular re-education via demo, multimodal cues and assistance PRN for 15 x 1 each- supine:  bil ankle circles clockwise and counterclockwise, L ankle pumps iwht flexed and extended knee, bil lower trunk rotation, bil hip abduction/adduction.  Pt reported that he needed to urinate.  Supine> sit with min assist to R.  [facility]> [facility]  Gait training in room on flat tile with HW with min assist.  Pt stood to urinate without LOB.  Some spillage on floor due to urgency.  L.nmr 15 x 1 each:  Seated bil heel raises, L long  arc quad knee extensions, bil adductor squeezes    Transfer training without use of AD, 5 x 1 sit> stand from raised bed without use of UEs (PT supporting LUE) with min assist> CGA.  Gait training with HW from bed> doorway on level tile, x 15' with min assist.  Demo and mod cues prior to training for safe use of HW; pt improved in step lengths , LLE wt bearing, and sequencing.  After 15', pt exhausted and unable to continue; PT had to call for help to get wc behind him.  Pt did not c/o of pain but stated he was tired.  Stand pivot transfer without use of HW to return to bed, CGA.  Sit> supine with extra time, supervision and cues, with HOB raised.  Pt reported a feeling of "pins and needles" in both UEs and LEs, during gait.  PT informed Mekides, RN who will consult MD.  At end of session, pt resting in bed with alarm set and needs at hand.     Therapy Documentation Precautions:  Precautions Precautions: Fall,Posterior Hip Precaution Booklet Issued: No Precaution Comments: PEG, posterior hip precautions Restrictions Weight Bearing Restrictions: Yes RUE Weight Bearing: Weight bearing as tolerated LUE Weight Bearing: Weight bearing as tolerated LLE Weight Bearing: Weight bearing as tolerated       Therapy/Group: Individual Therapy  Keslyn Teater 05/16/2020, 6:11 PM

## 2020-05-16 NOTE — Progress Notes (Signed)
Occupational Therapy Session Note  Patient Details  Name: Jeremy George. MRN: [id number] Date of Birth: 07-Nov-1995  Today's Date: 05/16/2020 OT Individual Time: [id number] OT Individual Time Calculation (min): 60 min    Today's Date: 05/16/2020 OT Individual Time: 1145-1200 OT Individual Time Calculation (min): 15 min    Short Term Goals: Week 3:  OT Short Term Goal 1 (Week 3): Pt will complete BSC transfers with min A OT Short Term Goal 2 (Week 3): Pt will complete bathing at shower level with mod A OT Short Term Goal 3 (Week 3): Pt will demonstrate emergent awareness with mod cueing  Skilled Therapeutic Interventions/Progress Updates:      SESSION 1 Pt received in bed agreeable to OT. Pt with no pain reported ADL:  Pt completes grooming seated at sink with VC for termination as pt attempts to brush teeth second time Pt completes UB dressing with MOD A to pull shirt over large L gauze bandage and over R IV Pt completes LB dressing with demo of reacher with MOD A for standing balnace Pt completes footwear with demo of sock aide but LUE too weak to use without MOD A    Therapeutic activity Pt completes 2x10 feet functional mobility in hallway and in room to recliner with VC for hemi walker sequence and safe reach back. Seated rest d/t stomach pain  Pt left at end of session in bedwith exit alarm on, call light in reach and all needs met   SESSION 2: Pt received in bed asleep with mom present. Pt unarousable sleeping soundly. Mother reporting pt attempted to get up out of recliner after OT left, but pt able to recall needing A to walk in morning and waited for RN staff to assist. OT reinforces education on impaired insight, impulsivity, decreased temp regulation and DME for home. Mom and OT discuss needed DME recs/projected LOF. Pt still asleep and mom requests pt to rest. Exited session with pt seated in bed missing 15 min skilled OT d/t fatigue, exit alarm on and call light  in reach      Therapy Documentation Precautions:  LUE NWB LLE WBAT- post hip precautions lifted  General:   Vital Signs: Therapy Vitals Temp: (!) 97.5 F (36.4 C) Temp Source: Oral Pulse Rate: (!) 110 Resp: 20 BP: 135/82 Patient Position (if appropriate): Lying Oxygen Therapy SpO2: 98 % O2 Device: Room Air Pain: Pain Assessment Pain Scale: 0-10 Pain Score: 2  Pain Type: Surgical pain Pain Location: Arm Pain Orientation: Left Pain Descriptors / Indicators: Aching;Constant;Stabbing Pain Frequency: Constant Pain Onset: On-going Patients Stated Pain Goal: 2 Pain Intervention(s): Medication (See eMAR);Repositioned;Cold applied ADL: ADL Eating: Dependent Where Assessed-Eating: Edge of bed Grooming: Maximal assistance Where Assessed-Grooming: Edge of bed Upper Body Bathing: Unable to assess Lower Body Bathing: Unable to assess Upper Body Dressing: Unable to assess Lower Body Dressing: Unable to assess Toileting: Unable to assess Toilet Transfer: Unable to assess Tub/Shower Transfer: Unable to assess Gaffer Transfer: Unable to assess Social research officer, government Method: Unable to assess Vision   Perception    Praxis   Exercises:   Other Treatments:     Therapy/Group: Individual Therapy  Tonny Branch 05/16/2020, 6:48 AM

## 2020-05-16 NOTE — Progress Notes (Signed)
Speech Language Pathology Daily Session Note  Patient Details  Name: Jeremy George. MRN: [id number] Date of Birth: 05-05-1995  Today's Date: 05/16/2020 SLP Individual Time: 0730-0800 SLP Individual Time Calculation (min): 30 min  Short Term Goals: Week 3: SLP Short Term Goal 1 (Week 3): Patient will utilize speech intelligibility strategies at the phrase level to achieve ~90% intelligibility with Min A multimodal cues. SLP Short Term Goal 2 (Week 3): Patient will demonstrate sustained attention to functional tasks for 10 mintues with Min verbal cues for redirection. SLP Short Term Goal 3 (Week 3): Patient will demonstrate functional problem solving for basic and familiar tasks with Mod A verbal cues. SLP Short Term Goal 4 (Week 3): Patient will recall 1 event/piece of relevant infromation daily with Mod A verbal cues. SLP Short Term Goal 5 (Week 3): Patient will consume current diet with minimal overt s/s of aspiration and Mod verbal cues for use of swallowing compensatory strategies.  Skilled Therapeutic Interventions:   Skilled ST services focused on swallow and cognitive skills. SLP facilitated PO consumption of breakfast tray dys 2 textures and thin liquids via straw, Pt consumed minimal amounts of solids (supports not being a breakfast person), but was able to clear oral cavity with extra time (inattention to bolus, reduced cohesion) and mild anterior spillage, unaware of cereal on lips.Pt recalled current goals for Pt, working on transferring and standing, speech therapy goals of speech intelligibility, next steps of re-wrapping elbow with supervision A verbal cues and with mod A verbal for general OT goals. Pt' speech was 90% intelligible  At phrase level with mod A cues, with awareness of mumbled speech. Pt was left in room with family, call bell within reach and bed alarm set. SLP recommends to continue skilled services.  Pain Pain Assessment Pain Scale: 0-10 Pain Score: 2   Pain Type: Surgical pain Pain Location: Arm Pain Orientation: Left Pain Descriptors / Indicators: Aching;Constant;Stabbing Pain Frequency: Constant Pain Onset: On-going Patients Stated Pain Goal: 2 Pain Intervention(s): Medication (See eMAR);Repositioned;Cold applied  Therapy/Group: Individual Therapy  Blain Hunsucker  [facility] 05/16/2020, 7:21 AM
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
## 2020-05-16 NOTE — Progress Notes (Signed)
SPORTS MEDICINE AND JOINT REPLACEMENT  Jeremy Spurling, MD    Laurier Nancy, PA-C [address] [place], [place], [place]  [address]                             [phone]   PROGRESS NOTE  Subjective:  negative for Chest Pain  negative for Shortness of Breath  negative for Nausea/Vomiting   negative for Calf Pain  negative for Bowel Movement   Tolerating Diet: yes         Patient reports pain as 3 on 0-10 scale.    Objective: Vital signs in last 24 hours:   Patient Vitals for the past 24 hrs:  BP Temp Temp src Pulse Resp SpO2 Weight  05/16/20 1714 108/61 97.9 F (36.6 C) -- 91 18 100 % --  05/16/20 1237 134/84 97.7 F (36.5 C) -- (!) 102 19 100 % --  05/16/20 0856 134/87 98.1 F (36.7 C) -- 100 18 100 % --  05/16/20 0507 135/82 (!) 97.5 F (36.4 C) Oral (!) 110 20 98 % 68.3 kg  05/16/20 0253 121/75 97.6 F (36.4 C) -- (!) 103 20 100 % --  05/15/20 1951 (!) 145/86 98.9 F (37.2 C) -- 95 -- 99 % --  05/15/20 1946 (!) 145/68 -- -- 74 -- -- --    @flow {1959:LAST@   Intake/Output from previous day:   03/25 0701 - 03/26 0700 In: 1300 [I.V.:800] Out: 1200 [Urine:1150]   Intake/Output this shift:   03/26 0701 - 03/26 1900 In: 800 [P.O.:600] Out: -    Intake/Output      03/25 0701 03/26 0700 03/26 0701 03/27 0700   P.O.  600   I.V. (mL/kg) 800 (11.7)    IV Piggyback 500 200   Total Intake(mL/kg) 1300 (19) 800 (11.7)   Urine (mL/kg/hr) 1150 (0.7)    Blood 50    Total Output 1200    Net +100 +800        Urine Occurrence  2 x      LABORATORY DATA: Recent Labs    05/11/20 0619  WBC 6.9  HGB 10.5*  HCT 32.3*  PLT 433*   Recent Labs    05/11/20 0619  NA 134*  K 3.6  CL 99  CO2 27  BUN 9  CREATININE 0.63  GLUCOSE 111*  CALCIUM 9.4   Lab Results  Component Value Date   INR 1.4 (H) 03/29/2020   INR 1.5 (H) 03/29/2020    Examination:  General appearance: alert, cooperative and no distress Extremities: extremities normal, atraumatic, no  cyanosis or edema  Wound Exam: clean, dry, intact   Drainage:  None: wound tissue dry  Motor Exam: Opposition, Pinch and Wrist Dorsiflexion Intact  Sensory Exam: Radial, Ulnar and Median normal   Assessment:    1 Day Post-Op  Procedure(s) (LRB): REPAIR OF LEFT DISTAL HUMERUS NONUNION WITH RIA HARVEST (Left)  ADDITIONAL DIAGNOSIS:  Principal Problem:   TBI (traumatic brain injury) (HCC) Active Problems:   Protein-calorie malnutrition, severe   Sinus tachycardia   Postoperative pain   Thrombocytosis   S/P percutaneous endoscopic gastrostomy (PEG) tube placement (HCC)   Dysphagia   Trauma     Plan: Physical Therapy as ordered Non Weight Bearing (NWB)  DVT Prophylaxis:  Lovenox  Patient made significant progress today with OT. Pain under control and resting comfortably in bed. Ortho will continue to follow.  05/27/2020 05/16/2020, 5:55 PM

## 2020-05-17 DIAGNOSIS — M25629 Stiffness of unspecified elbow, not elsewhere classified: Secondary | ICD-10-CM

## 2020-05-17 DIAGNOSIS — M25622 Stiffness of left elbow, not elsewhere classified: Secondary | ICD-10-CM

## 2020-05-17 DIAGNOSIS — D62 Acute posthemorrhagic anemia: Secondary | ICD-10-CM

## 2020-05-17 MED ORDER — PROSOURCE PLUS PO LIQD
90.0000 mL | Freq: Two times a day (BID) | ORAL | Status: DC
  Administered 2020-05-17 (×2): 90 mL via ORAL
  Filled 2020-05-17 (×3): qty 90
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
## 2020-05-17 NOTE — Progress Notes (Signed)
Occupational Therapy Session Note  Patient Details  Name: Jeremy George. MRN: [id number] Date of Birth: Nov 13, 1995  Today's Date: 05/17/2020 OT Individual Time: 5329-9242 OT Individual Time Calculation (min): 29 min   Short Term Goals: Week 3:  OT Short Term Goal 1 (Week 3): Pt will complete BSC transfers with min A OT Short Term Goal 2 (Week 3): Pt will complete bathing at shower level with mod A OT Short Term Goal 3 (Week 3): Pt will demonstrate emergent awareness with mod cueing  Skilled Therapeutic Interventions/Progress Updates:    Pt greeted in bed with c/o 5/10 pain in abdomen due to catheter site. Per RN, pt premedicated for pain. Pt was agreeable to get OOB to brush his teeth. Pt able to don the Rt gripper sock while lying in bed, used the sock aide for donning his Lt sock given Min A and vcs while sitting EOB. He then ambulated to the sink using hemiwalker with Min A and vcs for widening base of support. Vcs also for neutral base of support when standing at the sink due to visible Lt LE offloading. Pt with increased knee flexion bilaterally as well. Mother needed to grab a chair so he could sit down. Pt then required Min A and vcs to complete oral care, he did well once we got him started. Unable to use the Lt hand to manage rinse cup due to elbow flexion limitations. When he was finished pt ambulated back to bed with the same assistance while using hemi walker. He had a few sips of water with full supervision before returning to bed. Pt remained in bed at close of session, all needs within reach and bed alarm set. Tx focus placed on motor planning, dynamic balance, standing endurance, and functional cognition.   Therapy Documentation Precautions:  Precautions Precautions: Fall,Posterior Hip Precaution Booklet Issued: No Precaution Comments: PEG, posterior hip precautions Restrictions Weight Bearing Restrictions: Yes RUE Weight Bearing: Weight bearing as tolerated LUE  Weight Bearing: Weight bearing as tolerated LLE Weight Bearing: Weight bearing as tolerated Vital Signs: Therapy Vitals Temp: 98.4 F (36.9 C) Pulse Rate: 96 Resp: 20 BP: 107/70 Patient Position (if appropriate): Lying Oxygen Therapy SpO2: 100 % O2 Device: Room Air Pain: Pain Assessment Faces Pain Scale: Hurts little more ADL: ADL Eating: Dependent Where Assessed-Eating: Edge of bed Grooming: Maximal assistance Where Assessed-Grooming: Edge of bed Upper Body Bathing: Unable to assess Lower Body Bathing: Unable to assess Upper Body Dressing: Unable to assess Lower Body Dressing: Unable to assess Toileting: Unable to assess Toilet Transfer: Unable to assess Tub/Shower Transfer: Unable to assess Psychologist, counselling Transfer: Unable to assess Film/video editor Method: Unable to assess     Therapy/Group: Individual Therapy  Jasdeep Dejarnett A Anthonia Monger 05/17/2020, 4:24 PM
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
## 2020-05-17 NOTE — Progress Notes (Signed)
Late entry...  Noted catheter at Peg site is out again. A new catheter placed to keep stoma open. Dr. Allena Katz made aware. Catheter is to remain on place for now per Dr. Allena Katz.    Marylu Lund, RN

## 2020-05-17 NOTE — Progress Notes (Signed)
Orthopaedic Trauma Progress Note  SUBJECTIVE: Patient doing well this morning.  Pain has been controlled throughout the weekend.  He was able to put some weight on the left leg yesterday with therapies and states this went well but did have some pins-and-needles feeling.  Patient is scheduled for procedure with plastic surgery tomorrow for removal of MMF.  Patient is very eager to go home, states that he has not seen his 26-year-old son in 50 days.  OBJECTIVE:  Vitals:   05/17/20 0008 05/17/20 0351  BP: 121/66 118/72  Pulse: (!) 114 (!) 101  Resp: 20 20  Temp: 98.2 F (36.8 C) 98.4 F (36.9 C)  SpO2: 100% 100%    General: Sitting up in bed, drawing a picture. No acute distress Respiratory: No increased work of breathing.  LUE: Incision clean, dry, intact.  New dressing applied.  About 15 degrees shy of full elbow extension but is has improved from preop.  Flexion to 95 degrees.  Compartments soft and compressible.  Able to wiggle fingers and squeeze my hand. Tolerates passive forward elevation of shoulder and is actively able to get to 120 degrees. 2+ Radial pulse  ZJI:[id number] over posterior lateral hip is clean, dry, intact with Dermabond in place.  No significant tenderness with palpation over the hip or throughout the thigh.  Tolerates gentle hip and knee motion.  Ankle dorsiflexion/plantarflexion is intact.  Wiggles toes.  Compartments compressible. + DP pulse  IMAGING: Postop imaging of left elbow stable with graft in place  LABS:  No results found for this or any previous visit (from the past 24 hour(s)).  ASSESSMENT: Jeremy George. is a 25 y.o. male, 2 days postop s/p removal of antibiotic cement and repair of left distal humerus nonunion.   Other injuries include: 1. Right clavicle fracture s/p ORIF 04/03/20 2. Left posterior wall acetabular fracture/dislocation s/p ORIF 04/01/20 4. Left Monteggia fracture/dislocation s/p ORIF 04/01/20   PLAN: Weightbearing:  NWB LUE,  WBAT LLE, WBAT RUE. ROM: Unrestricted AROM/PROM bilateral upper and lower extremities. Discontinue posterior hip precautions LLE Incisional and dressing care: LLE incision may be left open to air.  Change LUE dressing PRN Orthopedic device(s): None needed Pain management: per primary team VTE prophylaxis: Lovenox, SCDs Impediments to Fracture Healing: Polytrauma, significant bone loss. Vit D level 12, continue supplementation  Dispo: Continue therapies per CIR team. Follow - up plan: We will continue to follow patient while in hospital. Plan for outpatient follow-up with Dr. [name] 2 weeks after discharge  Contact information:  Jeremy Merle MD, Ulyses Southward PA-C. After hours and holidays please check [email or web address] for group call information for [facility]   Charrie Mcconnon A. Michaelyn Barter, PA-C [phone] (office) [email or web address]

## 2020-05-18 ENCOUNTER — Ambulatory Visit (HOSPITAL_COMMUNITY): Admission: RE | Admit: 2020-05-18 | Payer: Self-pay | Source: Home / Self Care | Admitting: Plastic Surgery

## 2020-05-18 ENCOUNTER — Encounter (HOSPITAL_COMMUNITY): Payer: Self-pay | Admitting: Physical Medicine & Rehabilitation

## 2020-05-18 ENCOUNTER — Encounter (HOSPITAL_COMMUNITY)
Admission: RE | Disposition: A | Discharge status: Home Health | Payer: Self-pay | Source: Intra-hospital | Attending: Physical Medicine & Rehabilitation

## 2020-05-18 ENCOUNTER — Inpatient Hospital Stay (HOSPITAL_COMMUNITY): Payer: Medicaid Other | Admitting: Anesthesiology

## 2020-05-18 DIAGNOSIS — Z472 Encounter for removal of internal fixation device: Secondary | ICD-10-CM

## 2020-05-18 HISTORY — PX: MANDIBULAR HARDWARE REMOVAL: SHX5205

## 2020-05-18 LAB — CBC
HCT: 26.1 % — ABNORMAL LOW (ref 39.0–52.0)
Hemoglobin: 8.8 g/dL — ABNORMAL LOW (ref 13.0–17.0)
MCH: 27.5 pg (ref 26.0–34.0)
MCHC: 33.7 g/dL (ref 30.0–36.0)
MCV: 81.6 fL (ref 80.0–100.0)
Platelets: 386 10*3/uL (ref 150–400)
RBC: 3.2 MIL/uL — ABNORMAL LOW (ref 4.22–5.81)
RDW: 14 % (ref 11.5–15.5)
WBC: 9.8 10*3/uL (ref 4.0–10.5)
nRBC: 0 % (ref 0.0–0.2)

## 2020-05-18 LAB — BASIC METABOLIC PANEL
Anion gap: 8 (ref 5–15)
BUN: 6 mg/dL (ref 6–20)
CO2: 26 mmol/L (ref 22–32)
Calcium: 8.7 mg/dL — ABNORMAL LOW (ref 8.9–10.3)
Chloride: 99 mmol/L (ref 98–111)
Creatinine, Ser: 0.55 mg/dL — ABNORMAL LOW (ref 0.61–1.24)
GFR, Estimated: >60 mL/min (ref >60)
Glucose, Bld: 94 mg/dL (ref 70–99)
Potassium: 3.9 mmol/L (ref 3.5–5.1)
Sodium: 133 mmol/L — ABNORMAL LOW (ref 135–145)

## 2020-05-18 SURGERY — REMOVAL, HARDWARE, MANDIBLE
Anesthesia: General | Site: Mouth

## 2020-05-18 MED ORDER — FENTANYL CITRATE (PF) 100 MCG/2ML IJ SOLN
INTRAMUSCULAR | Status: DC | PRN
  Administered 2020-05-18: 100 ug via INTRAVENOUS

## 2020-05-18 MED ORDER — OXYMETAZOLINE HCL 0.05 % NA SOLN
NASAL | Status: AC
  Filled 2020-05-18: qty 30

## 2020-05-18 MED ORDER — PROPOFOL 10 MG/ML IV BOLUS
INTRAVENOUS | Status: AC
  Filled 2020-05-18: qty 20

## 2020-05-18 MED ORDER — LIDOCAINE 2% (20 MG/ML) 5 ML SYRINGE
INTRAMUSCULAR | Status: DC | PRN
  Administered 2020-05-18: 80 mg via INTRAVENOUS

## 2020-05-18 MED ORDER — ONDANSETRON HCL 4 MG/2ML IJ SOLN
INTRAMUSCULAR | Status: AC
  Filled 2020-05-18: qty 2

## 2020-05-18 MED ORDER — ONDANSETRON HCL 4 MG/2ML IJ SOLN
INTRAMUSCULAR | Status: DC | PRN
  Administered 2020-05-18: 4 mg via INTRAVENOUS

## 2020-05-18 MED ORDER — VALPROIC ACID 250 MG PO CAPS
750.0000 mg | ORAL_CAPSULE | Freq: Three times a day (TID) | ORAL | Status: DC
  Administered 2020-05-18 – 2020-06-02 (×44): 750 mg via ORAL
  Filled 2020-05-18 (×46): qty 3

## 2020-05-18 MED ORDER — FENTANYL CITRATE (PF) 100 MCG/2ML IJ SOLN: 25.0000 ug | INTRAMUSCULAR | Status: DC | PRN

## 2020-05-18 MED ORDER — CEFAZOLIN SODIUM-DEXTROSE 2-4 GM/100ML-% IV SOLN
2.0000 g | INTRAVENOUS | Status: AC
  Administered 2020-05-18: 2 g via INTRAVENOUS

## 2020-05-18 MED ORDER — MIDAZOLAM HCL 2 MG/2ML IJ SOLN
INTRAMUSCULAR | Status: AC
  Filled 2020-05-18: qty 2

## 2020-05-18 MED ORDER — FENTANYL CITRATE (PF) 100 MCG/2ML IJ SOLN
INTRAMUSCULAR | Status: AC
  Administered 2020-05-18: 25 ug via INTRAVENOUS
  Filled 2020-05-18: qty 2

## 2020-05-18 MED ORDER — CEFAZOLIN SODIUM-DEXTROSE 2-4 GM/100ML-% IV SOLN
INTRAVENOUS | Status: AC
  Filled 2020-05-18: qty 100

## 2020-05-18 MED ORDER — ONDANSETRON HCL 4 MG/2ML IJ SOLN: 4.0000 mg | Freq: Once | INTRAMUSCULAR | Status: DC | PRN

## 2020-05-18 MED ORDER — PROPOFOL 10 MG/ML IV BOLUS
INTRAVENOUS | Status: DC | PRN
  Administered 2020-05-18: 120 mg via INTRAVENOUS

## 2020-05-18 MED ORDER — ACETAMINOPHEN 10 MG/ML IV SOLN
INTRAVENOUS | Status: AC
  Administered 2020-05-18: 1000 mg via INTRAVENOUS
  Filled 2020-05-18: qty 100

## 2020-05-18 MED ORDER — LIDOCAINE 2% (20 MG/ML) 5 ML SYRINGE
INTRAMUSCULAR | Status: AC
  Filled 2020-05-18: qty 5

## 2020-05-18 MED ORDER — OXYCODONE HCL 5 MG PO TABS
5.0000 mg | ORAL_TABLET | Freq: Four times a day (QID) | ORAL | Status: DC | PRN
  Administered 2020-05-18 – 2020-05-20 (×3): 10 mg via ORAL
  Administered 2020-05-20: 5 mg via ORAL
  Administered 2020-05-21 – 2020-05-29 (×19): 10 mg via ORAL
  Administered 2020-05-31: 5 mg via ORAL
  Administered 2020-06-01: 10 mg via ORAL
  Administered 2020-06-01: 5 mg via ORAL
  Filled 2020-05-18 (×10): qty 2
  Filled 2020-05-18: qty 1
  Filled 2020-05-18 (×2): qty 2
  Filled 2020-05-18: qty 1
  Filled 2020-05-18: qty 2
  Filled 2020-05-18: qty 1
  Filled 2020-05-18: qty 2
  Filled 2020-05-18: qty 1
  Filled 2020-05-18 (×4): qty 2
  Filled 2020-05-18: qty 1
  Filled 2020-05-18 (×4): qty 2

## 2020-05-18 MED ORDER — SUGAMMADEX SODIUM 200 MG/2ML IV SOLN
INTRAVENOUS | Status: DC | PRN
  Administered 2020-05-18: 200 mg via INTRAVENOUS

## 2020-05-18 MED ORDER — FENTANYL CITRATE (PF) 250 MCG/5ML IJ SOLN
INTRAMUSCULAR | Status: AC
  Filled 2020-05-18: qty 5

## 2020-05-18 MED ORDER — ROCURONIUM BROMIDE 10 MG/ML (PF) SYRINGE
PREFILLED_SYRINGE | INTRAVENOUS | Status: DC | PRN
  Administered 2020-05-18: 50 mg via INTRAVENOUS

## 2020-05-18 MED ORDER — FREE WATER
100.0000 mL | Freq: Four times a day (QID) | Status: DC
  Administered 2020-05-18 – 2020-05-21 (×8): 100 mL

## 2020-05-18 MED ORDER — ACETAMINOPHEN 10 MG/ML IV SOLN: 1000.0000 mg | Freq: Once | INTRAVENOUS | Status: DC | PRN

## 2020-05-18 MED ORDER — DEXAMETHASONE SODIUM PHOSPHATE 10 MG/ML IJ SOLN
INTRAMUSCULAR | Status: AC
  Filled 2020-05-18: qty 1

## 2020-05-18 MED ORDER — DEXAMETHASONE SODIUM PHOSPHATE 10 MG/ML IJ SOLN
INTRAMUSCULAR | Status: DC | PRN
  Administered 2020-05-18: 10 mg via INTRAVENOUS

## 2020-05-18 MED ORDER — LIDOCAINE-EPINEPHRINE 1 %-1:100000 IJ SOLN
INTRAMUSCULAR | Status: DC | PRN
  Administered 2020-05-18: 20 mL

## 2020-05-18 MED ORDER — ROCURONIUM BROMIDE 10 MG/ML (PF) SYRINGE
PREFILLED_SYRINGE | INTRAVENOUS | Status: AC
  Filled 2020-05-18: qty 10

## 2020-05-18 MED ORDER — 0.9 % SODIUM CHLORIDE (POUR BTL) OPTIME
TOPICAL | Status: DC | PRN
  Administered 2020-05-18: 1000 mL

## 2020-05-18 MED ORDER — LIDOCAINE-EPINEPHRINE 1 %-1:100000 IJ SOLN
INTRAMUSCULAR | Status: AC
  Filled 2020-05-18: qty 1

## 2020-05-18 MED ORDER — DEXMEDETOMIDINE (PRECEDEX) IN NS 20 MCG/5ML (4 MCG/ML) IV SYRINGE
PREFILLED_SYRINGE | INTRAVENOUS | Status: DC | PRN
  Administered 2020-05-18: 12 ug via INTRAVENOUS

## 2020-05-18 MED ORDER — LACTATED RINGERS IV SOLN: INTRAVENOUS | Status: DC | PRN

## 2020-05-18 MED ORDER — CHLORDIAZEPOXIDE HCL 5 MG PO CAPS
5.0000 mg | ORAL_CAPSULE | Freq: Two times a day (BID) | ORAL | Status: DC
  Administered 2020-05-18 – 2020-05-22 (×8): 5 mg via ORAL
  Filled 2020-05-18 (×8): qty 1

## 2020-05-18 SURGICAL SUPPLY — 20 items
CANISTER SUCT 3000ML PPV (MISCELLANEOUS) ×2 IMPLANT
COVER SURGICAL LIGHT HANDLE (MISCELLANEOUS) ×2 IMPLANT
ELECT COATED BLADE 2.86 ST (ELECTRODE) ×2 IMPLANT
ELECT REM PT RETURN 9FT ADLT (ELECTROSURGICAL) ×2
ELECTRODE REM PT RTRN 9FT ADLT (ELECTROSURGICAL) ×1 IMPLANT
GLOVE BIOGEL M STRL SZ7.5 (GLOVE) ×2 IMPLANT
GLOVE INDICATOR 8.0 STRL GRN (GLOVE) ×2 IMPLANT
GOWN STRL REUS W/ TWL LRG LVL3 (GOWN DISPOSABLE) ×2 IMPLANT
GOWN STRL REUS W/TWL LRG LVL3 (GOWN DISPOSABLE) ×6
KIT BASIN OR (CUSTOM PROCEDURE TRAY) ×2 IMPLANT
KIT TURNOVER KIT B (KITS) ×2 IMPLANT
NDL PRECISIONGLIDE 27X1.5 (NEEDLE) ×1 IMPLANT
NEEDLE PRECISIONGLIDE 27X1.5 (NEEDLE) ×2 IMPLANT
NS IRRIG 1000ML POUR BTL (IV SOLUTION) ×2 IMPLANT
PAD ARMBOARD 7.5X6 YLW CONV (MISCELLANEOUS) ×4 IMPLANT
PENCIL BUTTON HOLSTER BLD 10FT (ELECTRODE) ×2 IMPLANT
POSITIONER HEAD DONUT 9IN (MISCELLANEOUS) ×2 IMPLANT
TOWEL GREEN STERILE (TOWEL DISPOSABLE) ×2 IMPLANT
TOWEL GREEN STERILE FF (TOWEL DISPOSABLE) ×2 IMPLANT
TRAY ENT MC OR (CUSTOM PROCEDURE TRAY) ×2 IMPLANT

## 2020-05-18 NOTE — Anesthesia Procedure Notes (Signed)
Procedure Name: Intubation Date/Time: 05/18/2020 5:01 PM Performed by: Sheppard Evens, CRNA Pre-anesthesia Checklist: Patient identified, Emergency Drugs available, Suction available, Patient being monitored and Timeout performed Patient Re-evaluated:Patient Re-evaluated prior to induction Oxygen Delivery Method: Circle system utilized Preoxygenation: Pre-oxygenation with 100% oxygen Induction Type: IV induction Ventilation: Mask ventilation without difficulty Laryngoscope Size: Glidescope and 3 Grade View: Grade I Tube type: Oral Tube size: 7.5 mm Number of attempts: 1 Airway Equipment and Method: Stylet and Video-laryngoscopy Placement Confirmation: ETT inserted through vocal cords under direct vision,  breath sounds checked- equal and bilateral and CO2 detector Secured at: 22 cm Tube secured with: Tape Dental Injury: Teeth and Oropharynx as per pre-operative assessment

## 2020-05-18 NOTE — Anesthesia Postprocedure Evaluation (Signed)
Anesthesia Post Note  Patient: Jeremy George.  Procedure(s) Performed: REPAIR OF LEFT DISTAL HUMERUS NONUNION WITH RIA HARVEST (Left Arm Upper)     Patient location during evaluation: PACU Anesthesia Type: General and Regional Level of consciousness: awake and alert Pain management: pain level controlled Vital Signs Assessment: post-procedure vital signs reviewed and stable Respiratory status: spontaneous breathing, nonlabored ventilation and respiratory function stable Cardiovascular status: blood pressure returned to baseline and stable Postop Assessment: no apparent nausea or vomiting Anesthetic complications: no   No complications documented.  Last Vitals:  Vitals:   05/18/20 1810 05/18/20 1815  BP: 126/82   Pulse: (!) 103 (!) 103  Resp: 15 19  Temp:  36.7 C  SpO2: 100% 100%    Last Pain:  Vitals:   05/18/20 1810  TempSrc:   PainSc: 2                  Mellody Dance

## 2020-05-18 NOTE — Progress Notes (Signed)
Physical Therapy Session Note  Patient Details  Name: Jeremy George. MRN: [id number] Date of Birth: 1996-01-22  Today's Date: 05/18/2020 PT Individual Time: 0802-0859 PT Individual Time Calculation (min): 57 min   Short Term Goals: Week 3:  PT Short Term Goal 1 (Week 3): Pt will perform supine<>sit with mod assist of 1 person consistently PT Short Term Goal 2 (Week 3): Pt will perform bed<>chair transfers with mod assist of 1 person consistently in both directions PT Short Term Goal 3 (Week 3): Pt will perform sit<>stands using R UE support and mod assist of 1 person consistently PT Short Term Goal 4 (Week 3): Pt will propel wheelchair 5ft with supervision  Skilled Therapeutic Interventions/Progress Updates:     Pt received supine in bed and agrees to therapy. Reports some pain in L hip. Number not provided. PT provides repositioning and rest breaks as needed to manage pain. Pt performs supine to sit slowly with verbal cues on positioning at EOB for safety. PT threads shorts on legs and pt performs sit to stand with minA to don shorts. PT re-wraps pt's L elbow with ace wrap prior to additional mobility. Stand pivot transfer to [facility] with modA. WC transport to gym for time management. Pt ambulates multiple bouts with lofstrand crutch in R upper extremity. PT sizes crutch for pt's height. Pt ambulates 4x20' with extended seated rest breaks. Pt initially ambulates with minA but requires modA with fatigue and when performing transitions from standing to sitting. Pt left in dayroom, sitting in tilt in space WC, handed off to speech therapy.  Therapy Documentation Precautions:  Precautions Precautions: Fall,Posterior Hip Precaution Booklet Issued: No Precaution Comments: PEG, posterior hip precautions Restrictions Weight Bearing Restrictions: Yes RUE Weight Bearing: Weight bearing as tolerated LUE Weight Bearing: Weight bearing as tolerated LLE Weight Bearing: Weight bearing as  tolerated   Therapy/Group: Individual Therapy  Beau Fanny 05/18/2020, 12:55 PM
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
## 2020-05-18 NOTE — Progress Notes (Signed)
Today patient underwent uncomplicated removal of bilateral maxillary and mandibular hardware.  He tolerated this fine.  No further management of his intraoral area is necessary.  He can follow-up with me on an outpatient basis after his discharge.

## 2020-05-18 NOTE — Progress Notes (Signed)
Speech Language Pathology Daily Session Note  Patient Details  Name: Jeremy George. MRN: [id number] Date of Birth: 14-Apr-1995  Today's Date: 05/18/2020 SLP Individual Time: 3300-7622 SLP Individual Time Calculation (min): 55 min  Short Term Goals: Week 3: SLP Short Term Goal 1 (Week 3): Patient will utilize speech intelligibility strategies at the phrase level to achieve ~90% intelligibility with Min A multimodal cues. SLP Short Term Goal 2 (Week 3): Patient will demonstrate sustained attention to functional tasks for 10 mintues with Min verbal cues for redirection. SLP Short Term Goal 3 (Week 3): Patient will demonstrate functional problem solving for basic and familiar tasks with Mod A verbal cues. SLP Short Term Goal 4 (Week 3): Patient will recall 1 event/piece of relevant infromation daily with Mod A verbal cues. SLP Short Term Goal 5 (Week 3): Patient will consume current diet with minimal overt s/s of aspiration and Mod verbal cues for use of swallowing compensatory strategies.  Skilled Therapeutic Interventions: Skilled treatment session focused on cognitive goals. Throughout session, patient reporting fatigue due to poor sleep and pain at PEG site. RN aware and administered pain medications. Of note, patient took pain medication whole with thin but required a very large liquid wash to clear from oral cavity. Continue to recommend patient take medications with puree. SLP facilitated session by providing Mod A verbal cues for sustained attention and functional problem solving during a basic money and medication management task. SLP also provided Min verbal cues for problem solving during a novel card task with Mod I needed for recall of procedures. Patient left upright in tilt-in-space wheelchair with mother present. Continue with current plan of care.      Pain Pain Assessment Pain Scale: 0-10 Pain Score: 4  Pain Type: Acute pain Pain Location: Abdomen Pain Orientation:  Right Pain Intervention(s): Medication (See eMAR)  Therapy/Group: Individual Therapy  Daron Breeding 05/18/2020, 11:53 AM

## 2020-05-18 NOTE — Op Note (Signed)
Operative Note   DATE OF OPERATION: 05/18/2020  SURGICAL DEPARTMENT: Plastic Surgery  PREOPERATIVE DIAGNOSES: Maxillomandibular fixation hardware  POSTOPERATIVE DIAGNOSES:  same  PROCEDURE: Removal of maxillomandibular fixation hardware from the maxilla and the mandible  SURGEON: Ancil Linsey, MD  ASSISTANT: Enedina Finner, RNFA The advanced practice practitioner (APP) assisted throughout the case.  The APP was essential in retraction and counter traction when needed to make the case progress smoothly.  This retraction and assistance made it possible to see the tissue plans for the procedure.  The assistance was needed for blood control, tissue re-approximation and assisted with closure of the incision site.  ANESTHESIA:  General.   COMPLICATIONS: None.   INDICATIONS FOR PROCEDURE:  The patient, Jeremy George is a 25 y.o. male born on 28-Jun-1995, is here for treatment of maxillomandibular fixation hardware that is no longer required.  I previously treated him for displaced maxillary fractures.  He seems to be well aligned and as far as I can tell the maxillomandibular occlusion seems to be appropriate. MRN: [id number]  CONSENT:  Informed consent was obtained directly from the patient. Risks, benefits and alternatives were fully discussed. Specific risks including but not limited to bleeding, infection, hematoma, seroma, scarring, pain, contracture, asymmetry, wound healing problems, and need for further surgery were all discussed. The patient did have an ample opportunity to have questions answered to satisfaction.   DESCRIPTION OF PROCEDURE:  The patient was taken to the operating room. SCDs were placed and antibiotics were given.  General anesthesia was administered.  The patient's operative site was prepped and draped in a sterile fashion. A time out was performed and all information was confirmed to be correct.  I started by infiltrating lidocaine with epinephrine.  I then  sequentially identified the screws holding the maxillary and mandibular arch bars in place and remove them.  10 screws were removed in total and both arch bars came off without any issues.  Hemostasis was ensured.  The patient tolerated the procedure well.  There were no complications. The patient was allowed to wake from anesthesia, extubated and taken to the recovery room in satisfactory condition.

## 2020-05-18 NOTE — Progress Notes (Signed)
Occupational Therapy Session Note  Patient Details  Name: Roney Youtz. MRN: [id number] Date of Birth: [birth year]  Today's Date: 05/18/2020 OT Individual Time: 1000-1015 OT Individual Time Calculation (min): 15 min  and Today's Date: 05/18/2020 OT Missed Time: 30 Minutes Missed Time Reason: Patient fatigue   Short Term Goals: Week 3:  OT Short Term Goal 1 (Week 3): Pt will complete BSC transfers with min A OT Short Term Goal 2 (Week 3): Pt will complete bathing at shower level with mod A OT Short Term Goal 3 (Week 3): Pt will demonstrate emergent awareness with mod cueing  Skilled Therapeutic Interventions/Progress Updates:    Pt greeted sitting up in TIS wc with mother present. Pt reports being very tired and not wanting to participate in OT today. Pt agreeable to go to the sink and complete grooming tasks. Set-up A to wash face and hands. Pt declined any further BADLs and wanted to return to bed.Pt reports he has a procedure this afternoon. Pt completed stand-pivot back to bed with min A. Pt left semi-reclined in bed with bed alarm on, call bell in reach, and mother present.   Therapy Documentation Precautions:  Precautions Precautions: Fall,Posterior Hip Precaution Booklet Issued: No Precaution Comments: PEG, posterior hip precautions Restrictions Weight Bearing Restrictions: Yes RUE Weight Bearing: Weight bearing as tolerated LUE Weight Bearing: Weight bearing as tolerated LLE Weight Bearing: Weight bearing as tolerated General: General OT Amount of Missed Time: 30 Minutes Pain: Pain Assessment Pain Scale: 0-10 Pain Score: 8  Pain Type: Surgical pain Pain Location: Mouth Pain Descriptors / Indicators: Sore Pain Intervention(s):REst, repositioned    Therapy/Group: Individual Therapy  Mal Amabile 05/18/2020, 9:26 PM

## 2020-05-18 NOTE — Progress Notes (Signed)
Patient is back from surgery. Patient in no apparent distress. Family is at the bedside.

## 2020-05-18 NOTE — Transfer of Care (Signed)
Immediate Anesthesia Transfer of Care Note  Patient: Jeremy George.  Procedure(s) Performed: MANDIBULAR HARDWARE REMOVAL (N/A Mouth)  Patient Location: PACU  Anesthesia Type:General  Level of Consciousness: responds to stimulation  Airway & Oxygen Therapy: Patient Spontanous Breathing and Patient connected to nasal cannula oxygen  Post-op Assessment: Report given to RN and Post -op Vital signs reviewed and stable  Post vital signs: Reviewed and stable  Last Vitals:  Vitals Value Taken Time  BP 124/89 05/18/20 1737  Temp    Pulse 92 05/18/20 1738  Resp 14 05/18/20 1738  SpO2 100 % 05/18/20 1738  Vitals shown include unvalidated device data.  Last Pain:  Vitals:   05/18/20 1415  TempSrc: Oral  PainSc:       Patients Stated Pain Goal: 1 (05/17/20 2010)  Complications: No complications documented.

## 2020-05-18 NOTE — Progress Notes (Signed)
Call placed to plastic surgery on call for post op orders.

## 2020-05-18 NOTE — Brief Op Note (Signed)
04/27/2020 - 05/18/2020  5:24 PM  PATIENT:  Jeremy George.  25 y.o. male  PRE-OPERATIVE DIAGNOSIS:  maxillary fractures, orbital fractures, nasal fracture  POST-OPERATIVE DIAGNOSIS:  maxillary fractures, orbital fractures, nasal fracture  PROCEDURE:  Procedure(s): MANDIBULAR HARDWARE REMOVAL (N/A)  SURGEON:  Surgeon(s) and Role:    * Imogean Ciampa, Wendy Poet, MD - Primary  PHYSICIAN ASSISTANT: Enedina Finner, RNFA  ASSISTANTS: none   ANESTHESIA:   general  EBL:  5   BLOOD ADMINISTERED:none  DRAINS: none   LOCAL MEDICATIONS USED:  XYLOCAINE   SPECIMEN:  No Specimen  DISPOSITION OF SPECIMEN:  N/A  COUNTS:  YES  TOURNIQUET:  * No tourniquets in log *  DICTATION: .Dragon Dictation  PLAN OF CARE: Discharge to home after PACU  PATIENT DISPOSITION:  PACU - hemodynamically stable.   Delay start of Pharmacological VTE agent (>24hrs) due to surgical blood loss or risk of bleeding: not applicable

## 2020-05-18 NOTE — Progress Notes (Signed)
Orthopedic Tech Progress Note Patient Details:  Doc Mandala Nov 11, 1995 [id number] Called in order to [facility] for an JAS ELBOW SPLINT Patient ID: Jeremy Newton., male   DOB: February 27, 1995, 25 y.o.   MRN: [id number]   Jeremy George 05/18/2020, 11:24 AM

## 2020-05-18 NOTE — Anesthesia Postprocedure Evaluation (Signed)
Anesthesia Post Note  Patient: Jeremy George.  Procedure(s) Performed: MANDIBULAR HARDWARE REMOVAL (N/A Mouth)     Patient location during evaluation: PACU Anesthesia Type: General Level of consciousness: awake and alert and oriented Pain management: pain level controlled Vital Signs Assessment: post-procedure vital signs reviewed and stable Respiratory status: spontaneous breathing, nonlabored ventilation and respiratory function stable Cardiovascular status: blood pressure returned to baseline Postop Assessment: no apparent nausea or vomiting Anesthetic complications: no   No complications documented.  Last Vitals:  Vitals:   05/18/20 1815 05/18/20 1845  BP:  125/80  Pulse: (!) 103 97  Resp: 19 18  Temp: 36.7 C 36.8 C  SpO2: 100% 100%               Kaylyn Layer

## 2020-05-18 NOTE — Interval H&P Note (Signed)
History and Physical Interval Note:  05/18/2020 4:21 PM  Jeremy George.  has presented today for surgery, with the diagnosis of maxillary fractures, orbital fractures, nasal fracture.  The various methods of treatment have been discussed with the patient and family. After consideration of risks, benefits and other options for treatment, the patient has consented to  Procedure(s): MANDIBULAR HARDWARE REMOVAL (N/A) as a surgical intervention.  The patient's history has been reviewed, patient examined, no change in status, stable for surgery.  I have reviewed the patient's chart and labs.  Questions were answered to the patient's satisfaction.     Allena Napoleon

## 2020-05-19 ENCOUNTER — Encounter (HOSPITAL_COMMUNITY): Payer: Self-pay | Admitting: Plastic Surgery

## 2020-05-19 MED ORDER — QUETIAPINE FUMARATE 50 MG PO TABS
50.0000 mg | ORAL_TABLET | Freq: Every morning | ORAL | Status: DC
  Administered 2020-05-20 – 2020-05-22 (×3): 50 mg via ORAL
  Filled 2020-05-19 (×3): qty 1

## 2020-05-19 MED ORDER — QUETIAPINE FUMARATE 200 MG PO TABS
300.0000 mg | ORAL_TABLET | Freq: Every day | ORAL | Status: DC
  Administered 2020-05-19 – 2020-05-21 (×3): 300 mg via ORAL
  Filled 2020-05-19 (×4): qty 1
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
## 2020-05-19 NOTE — Patient Care Conference (Signed)
Inpatient RehabilitationTeam Conference and Plan of Care Update Date: 05/19/2020   Time: 10:12 AM    Patient Name: Jeremy George.      Medical Record Number: [id number]  Date of Birth: 01-05-1996 Sex: Male         Room/Bed: 4W16C/4W16C-01 Payor Info: Payor: /    Admit Date/Time:  04/27/2020  4:59 PM  Primary Diagnosis:  TBI (traumatic brain injury) [facility])  Hospital Problems: Principal Problem:   TBI (traumatic brain injury) (HCC) Active Problems:   Protein-calorie malnutrition, severe   Sinus tachycardia   Postoperative pain   Thrombocytosis   S/P percutaneous endoscopic gastrostomy (PEG) tube placement [facility])   Dysphagia   Trauma   Decreased ROM of elbow    Expected Discharge Date: Expected Discharge Date: 06/02/20  Team Members Present: Physician leading conference: Dr. Faith Rogue Care Coodinator Present: Cecile Sheerer, LCSWA;Rozalyn Osland Marlyne Beards, RN, BSN, CRRN Nurse Present: Kennyth Arnold, RN PT Present: Serina Cowper, PT OT Present: Jake Shark, OT SLP Present: Feliberto Gottron, SLP PPS Coordinator present : Fae Pippin, SLP     Current Status/Progress Goal Weekly Team Focus  Bowel/Bladder   Pt asking to uss urinal more needs staff to hold in place still has occasional incontinence 03/27  pt will be continent of B/B  toilet q2h and prn assist with urinal   Swallow/Nutrition/ Hydration   Dys. 2 textures with thin liquids, Min A  Mod I  trials of Dys. 3 textures   ADL's   Improvement in transfers with recent weightbearing precautions lifted- min A UB ADLs, mod A LB  min A- CGA overall  ADL retraining, transfers, cognitive retraining, motor planning/sequencing   Mobility   Mod-min A overall, gait up to 20 ft with AD of R  Min A overall (may upgrade due to L LE now WBAT based on patient's progress)  Motor plannning, activity tolerance, functional mobility, gait training as tolerated, balance, safety awareness, patient/caregiver education    Communication   Min A-Supervision  Supervision  use of speech intelligility strategies   Safety/Cognition/ Behavioral Observations  Mod A  Min A  orientation, attention, problem solving, awareness   Pain   pt having some oral/mouth discomfort (screws and mandible bars removed 03/28)Pt also complains of abdominal pain at foley site on abdomen  free of pain  assess for pain qshift and prn medicate as directed reassess for relief   Skin   multiple abrasion sites healing healed surgical incision to left hip area peg tube  free of breakdown or infection  assess qshift and prn     Discharge Planning:  Pt is Uninsured.Pt to discharge to home with his mother who will provide 24/7 care. Assistance from girlfriend as well.   Team Discussion: Antibiotic spacers have been removed from elbow, wires have been removed from jaw. MD still decreasing sedating medications. Patient is continent of B/B, complains of pain to the left arm, no sleep issues, skin issues to left hip, left arm, stage 2 on neck is healed. Educating on safety precautions, skin/wound care. Mother will be the primary caregiver and the girlfriend will be assisting. Girlfriend came to visit yesterday and it went well. Patient on target to meet rehab goals: Yes, patient is min assist for transfers, still working on motor planning issues. May upgrade some goals. Min/mod assist walking about 20 ft. Making good progress. PO intake has improved. Would like to know if a time could be set up for the dad to come in?  *See Care Plan  and progress notes for long and short-term goals.   Revisions to Treatment Plan:  Therapy may upgrade some goals, MD still slowly decreasing some sedating medications.  Teaching Needs: Family education, medication management, pain management, skin/wound care, transfer training, gait training, balance training, endurance training, safety awareness, weight bearing precautions.  Current Barriers to Discharge: Decreased  caregiver support, Medical stability, Home enviroment access/layout, Wound care, Lack of/limited family support, Weight bearing restrictions, Medication compliance, Behavior and Nutritional means  Possible Resolutions to Barriers: Continue current medications, offer nutritional support, provide emotional support.      Medical Summary Current Status: left elbow abx spacer removed--bone graft placed. Wires removed from jaw this morning. pain mgt good, pushing nutrition  Barriers to Discharge: Medical stability   Possible Resolutions to Barriers/Weekly Focus: wound care, nutritional mgt, tapering neuro-psych meds   Continued Need for Acute Rehabilitation Level of Care: The patient requires daily medical management by a physician with specialized training in physical medicine and rehabilitation for the following reasons: Direction of a multidisciplinary physical rehabilitation program to maximize functional independence : Yes Medical management of patient stability for increased activity during participation in an intensive rehabilitation regime.: Yes Analysis of laboratory values and/or radiology reports with any subsequent need for medication adjustment and/or medical intervention. : Yes   I attest that I was present, lead the team conference, and concur with the assessment and plan of the team.   Tennis Must 05/19/2020, 2:23 PM

## 2020-05-19 NOTE — Progress Notes (Signed)
Occupational Therapy Weekly Progress Note  Patient Details  Name: Jeremy George. MRN: [id number] Date of Birth: 12/11/1995  Beginning of progress report period: May 12, 2020 End of progress report period: May 19, 2020  Today's Date: 05/19/2020 OT Individual Time: 1450-1545 OT Individual Time Calculation (min): 55 min    Patient has met 2 of 3 short term goals. Pt has made great progress in his OT POC. He is able to complete UB ADLs with min A and LB ADLs with mod A. His awareness and motor planning has improved significantly. He is demonstrating behaviors consistent with a RLAS VI. His mother has been present for all sessions and is very supportive/involved.   Patient continues to demonstrate the following deficits: muscle weakness, decreased motor planning, decreased initiation, decreased attention, decreased awareness, decreased problem solving, decreased safety awareness, decreased memory and delayed processing and decreased sitting balance, decreased standing balance, decreased postural control and decreased balance strategies and therefore will continue to benefit from skilled OT intervention to enhance overall performance with BADL.  Patient progressing toward long term goals..  Continue plan of care.  OT Short Term Goals Week 3:  OT Short Term Goal 1 (Week 3): Pt will complete BSC transfers with min A OT Short Term Goal 1 - Progress (Week 3): Met OT Short Term Goal 2 (Week 3): Pt will complete bathing at shower level with mod A OT Short Term Goal 2 - Progress (Week 3): Met OT Short Term Goal 3 (Week 3): Pt will demonstrate emergent awareness with mod cueing OT Short Term Goal 3 - Progress (Week 3): Progressing toward goal Week 4:  OT Short Term Goal 1 (Week 4): Pt will complete transfer to [facility] with CGA OT Short Term Goal 2 (Week 4): Pt will don shirt with min A OT Short Term Goal 3 (Week 4): Pt will demonstrate emergent awareness with mod cueing OT Short Term Goal 4  (Week 4): Pt will bathe at shower level with min A overall  Skilled Therapeutic Interventions/Progress Updates:    Pt supine, messing with his feet. OT provided pt with his nail clippers and supervised him clipping his toenails. Pt requesting to get the dry skin off his feet. Pt transferred to EOB with supervision and OT assisted with B foot soak to maintain foot hygiene. Pt required min A to dry off feet and don socks following. Pt completed ambulatory transfer to the w/c with min A using a loft strand crutch on the R side. Pt completed 35 ft x2 repetitions of functional mobility to simulate household distances with crutch and min A overall, ocasional difficulty motor planning/sequencing and cueing required for increasing BOS and slowing pace. Pt reporting L knee and hip pain following so the remainder of the session was completed seated. Pt completed pipe tree with 100% accuracy and no cueing, demonstrating great improvement in sustained awareness. Pt completed transfer back to w/c. He was taken back to his room where he requested to eat a snack. Pt requested to eat dry cereal but OT reminded him of current D2 diet and need to add milk to soften cereal significantly. Pt was finally agreeable and ate 1/4 cup of cereal with set up assist. Pt transferred back to bed and was left supine with all needs met. Bed alarm set.   Therapy Documentation Precautions:  Precautions Precautions: Fall,Posterior Hip Precaution Booklet Issued: No Precaution Comments: PEG, posterior hip precautions Restrictions Weight Bearing Restrictions: Yes RUE Weight Bearing: Weight bearing as tolerated LUE Weight  Bearing: Weight bearing as tolerated LLE Weight Bearing: Weight bearing as tolerated   Therapy/Group: Individual Therapy  Curtis Sites 05/19/2020, 10:12 AM

## 2020-05-19 NOTE — Plan of Care (Signed)
  Problem: RH Ambulation Goal: LTG Patient will ambulate in controlled environment (PT) Description: LTG: Patient will ambulate in a controlled environment, # of feet with assistance (PT). Flowsheets (Taken 05/19/2020 1018) LTG: Pt will ambulate in controlled environ  assist needed:: (Added goal due to upgrade to WBAT on L LE.) Contact Guard/Touching assist LTG: Ambulation distance in controlled environment: 50 ft using LRAD Note: Added goal due to upgrade to WBAT on L LE. Goal: LTG Patient will ambulate in home environment (PT) Description: LTG: Patient will ambulate in home environment, # of feet with assistance (PT). Flowsheets (Taken 05/19/2020 1018) LTG: Pt will ambulate in home environ  assist needed:: Contact Guard/Touching assist LTG: Ambulation distance in home environment: (Added goal due to upgrade to WBAT on L LE.) 25 ft using LRAD. Note: Added goal due to upgrade to WBAT on L LE.

## 2020-05-19 NOTE — Progress Notes (Signed)
Physical Therapy Session Note  Patient Details  Name: Jeremy George. MRN: [id number] Date of Birth: [birth year]  Today's Date: 05/19/2020 PT Individual Time: 1020-1120 PT Individual Time Calculation (min): 60 min   Short Term Goals: Week 1:  PT Short Term Goal 1 (Week 1): Patient will perform rolling L with mod A. PT Short Term Goal 1 - Progress (Week 1): Met PT Short Term Goal 2 (Week 1): Patient will perform supine>sit using log roll technique to the L with max A. PT Short Term Goal 2 - Progress (Week 1): Met PT Short Term Goal 3 (Week 1): Patient will perform basic transfers with max A +2. PT Short Term Goal 3 - Progress (Week 1): Progressing toward goal PT Short Term Goal 4 (Week 1): Patient will tolerate sitting OOB >30 min 2/7 days. PT Short Term Goal 4 - Progress (Week 1): Met Week 2:  PT Short Term Goal 1 (Week 2): Patient will perform bed mobility with mod-max A of 1 person consistently. PT Short Term Goal 1 - Progress (Week 2): Met PT Short Term Goal 2 (Week 2): Patient will perform standing balance >1 min while maintaining weight bearing precautions with max A +2. PT Short Term Goal 2 - Progress (Week 2): Met PT Short Term Goal 3 (Week 2): Patient will perform basic transfers with max A +1 consistently. PT Short Term Goal 3 - Progress (Week 2): Met PT Short Term Goal 4 (Week 2): Patient will initiate manual w/c mobility. PT Short Term Goal 4 - Progress (Week 2): Met Week 3:  PT Short Term Goal 1 (Week 3): Pt will perform supine<>sit with mod assist of 1 person consistently PT Short Term Goal 2 (Week 3): Pt will perform bed<>chair transfers with mod assist of 1 person consistently in both directions PT Short Term Goal 3 (Week 3): Pt will perform sit<>stands using R UE support and mod assist of 1 person consistently PT Short Term Goal 4 (Week 3): Pt will propel wheelchair 54f with supervision  Skilled Therapeutic Interventions/Progress Updates:    Pain:  Pt reports  5/10 L knee pain w/activity/therex.  Treatment to tolerance.  Rest breaks and repositioning as needed.   Supine to sit w/cues to maintain NWBing LUE although able to recall and verbalize prior to task Therapist applied L UE bledsoe brace to pt, instructed pt w/proper alignment, pt assists w/straps stand pivot transfer to wc w/min assist and cues for sequencing/safety  Pt w/significant quad lag, approx 25* initially and end range tightness w/spongy end feel.  Quad sets 10 count hold x 15, cues to attend to task/maintain quad activation LAQs x 15 w/improved extension TKEs for end range isolation x 25 w/blue bolster. Standing TKE w/5 sec hold x 8 w/verbal and tactile cues to maimize extension trunk/hips/and knee  Gait: 311fx 2, 5050f 1 W/loftstrand crutch on R w/cga for balance, cues for corrdinating cane and L limb advancement, cues for increasing L knee extension thru gait cycle.  Pt w/progressivly more overall upright posture w/repetition of gait.  Positionining: Due to pt height, pt sits w/knees in approx 30* or greater knee flexion.  L legrest calf pad adjusted and locked by therapist, footplate flipped up and leg placed in max avail knee extension.  Mother educated re: importance of positioning in extension to facilitate gains in ROM.  Also discussed bed positioning.    Pt left supine w/rails up x 4, alarm set, bed in lowest position, and needs in reach.  Therapy Documentation Precautions:  Precautions Precautions: Fall,Posterior Hip Precaution Booklet Issued: No Precaution Comments: PEG, posterior hip precautions Restrictions Weight Bearing Restrictions: Yes RUE Weight Bearing: Weight bearing as tolerated LUE Weight Bearing: Weight bearing as tolerated LLE Weight Bearing: Weight bearing as tolerated   Therapy/Group: Individual Therapy  Callie Fielding, [place] 05/19/2020, 11:33 AM

## 2020-05-19 NOTE — Progress Notes (Signed)
Patient ID: Jeremy George., male   DOB: Mar 31, 1995, 25 y.o.   MRN: [id number]  SW met with pt and pt son in room to inform on gains made here in rehab. SW discussed family meeting with pt father present. Pt mother and father available whenever the physician is free. SW to follow-up after speaking with team.   SW left message for [place] [phone] to discuss if TBI application received.   Family meeting scheduled for Monday (4/4) at 10am. Pt mother to inform his father so he can be present for meeting. Will conference call pt girlfriend.   Loralee Pacas, MSW, [place] Office: [phone] Cell: [phone] Fax: [phone]

## 2020-05-19 NOTE — Progress Notes (Signed)
Speech Language Pathology Daily Session Note  Patient Details  Name: Jeremy George. MRN: [id number] Date of Birth: [birth year]  Today's Date: 05/19/2020 SLP Individual Time: 1300-1355 SLP Individual Time Calculation (min): 55 min  Short Term Goals: Week 3: SLP Short Term Goal 1 (Week 3): Patient will utilize speech intelligibility strategies at the phrase level to achieve ~90% intelligibility with Min A multimodal cues. SLP Short Term Goal 2 (Week 3): Patient will demonstrate sustained attention to functional tasks for 10 mintues with Min verbal cues for redirection. SLP Short Term Goal 3 (Week 3): Patient will demonstrate functional problem solving for basic and familiar tasks with Mod A verbal cues. SLP Short Term Goal 4 (Week 3): Patient will recall 1 event/piece of relevant infromation daily with Mod A verbal cues. SLP Short Term Goal 5 (Week 3): Patient will consume current diet with minimal overt s/s of aspiration and Mod verbal cues for use of swallowing compensatory strategies.  Skilled Therapeutic Interventions: Skilled treatment session focused on dysphagia and cognitive goals. SLP facilitated session by providing skilled observation with trials of Dys. 3 textures. Patient demonstrated efficient mastication with complete oral clearance with intermittent delayed cough X 2, suspect due to talking with a full oral cavity. Recommend trial tray prior to upgrade. SLP also facilitated session by providing a mildly complex calendar task in which patient required Min verbal cues for problem solving and to self-monitor and correct errors. Overall, patient appeared brighter today and was much more engaged with clinician. Patient independently recalled events from previous therapy sessions and was ~90% intelligible at the conversation level. Patient left upright in bed with alarm on and all needs within reach. Continue with current plan of care.      Pain No/Denies Pain   Therapy/Group:  Individual Therapy  Alejos Reinhardt 05/19/2020, 3:17 PM

## 2020-05-20 NOTE — Progress Notes (Signed)
Speech Language Pathology Weekly Progress and Session Note  Patient Details  Name: Jeremy George. MRN: [id number] Date of Birth: 09-28-1995  Beginning of progress report period: May 13, 2020 End of progress report period: May 20, 2020  Today's Date: 05/20/2020 SLP Individual Time: 1200-1230 SLP Individual Time Calculation (min): 30 min  Short Term Goals: Week 3: SLP Short Term Goal 1 (Week 3): Patient will utilize speech intelligibility strategies at the phrase level to achieve ~90% intelligibility with Min A multimodal cues. SLP Short Term Goal 1 - Progress (Week 3): Met SLP Short Term Goal 2 (Week 3): Patient will demonstrate sustained attention to functional tasks for 10 mintues with Min verbal cues for redirection. SLP Short Term Goal 2 - Progress (Week 3): Met SLP Short Term Goal 3 (Week 3): Patient will demonstrate functional problem solving for basic and familiar tasks with Mod A verbal cues. SLP Short Term Goal 3 - Progress (Week 3): Met SLP Short Term Goal 4 (Week 3): Patient will recall 1 event/piece of relevant infromation daily with Mod A verbal cues. SLP Short Term Goal 4 - Progress (Week 3): Met SLP Short Term Goal 5 (Week 3): Patient will consume current diet with minimal overt s/s of aspiration and Mod verbal cues for use of swallowing compensatory strategies. SLP Short Term Goal 5 - Progress (Week 3): Met    New Short Term Goals: Week 4: SLP Short Term Goal 1 (Week 4): Patient will demonstrate functional problem solving for mildly complex and familiar tasks with Min A verbal cues. SLP Short Term Goal 2 (Week 4): Patient will recall 3 event/piece of relevant infromation daily with Mod A verbal cues. SLP Short Term Goal 3 (Week 4): Patient will demonstrate selective attention to functional tasks for 30 minutes with Min verbal cues for redirection. SLP Short Term Goal 4 (Week 4): Patient will self-monitor and correct errors during functional tasks with Mod A  verbal cues. SLP Short Term Goal 5 (Week 4): Patient will consume current diet with minimal overt s/s of aspiration and Min verbal cues for use of swallowing compensatory strategies. SLP Short Term Goal 6 (Week 4): Patient will utilize speech intelligibility strategies at the sentence level to achieve ~90% intelligibility with supervision verbal cues.  Weekly Progress Updates: Patient has made excellent gains and has met 5 of 5 STGs this reporting period. Currently, patient demonstrate behaviors consistent with a [place]o Level VI and requires overall Mod A multimodal cues to complete functional and familiar tasks safely in regard to sustained attention, functional problem solving and recall. Patient also demonstrates increased ROM of his oral cavity since removal of hardware and requires overall Min A verbal cues for use of speech intelligibility strategies at the phrase level. Removal of oral hardware also improved patient's efficiency of mastication, therefore, patient has been upgraded to Dys. 3 textures. Patient is consuming current diet with minimal overt s/s of aspiration and overall Min-Mod A verbal cues for use of swallowing compensatory strategies. Patient and family education ongoing.  Patient would benefit from continued skilled SLP intervention to maximize his cognitive, swallowing and speech function prior to discharge.      Intensity: Minumum of 1-2 x/day, 30 to 90 minutes Frequency: 3 to 5 out of 7 days Duration/Length of Stay: 06/02/20 Treatment/Interventions: Dysphagia/aspiration precaution training;Therapeutic Activities;Environmental controls;Cueing hierarchy;Functional tasks;Patient/family education;Cognitive remediation/compensation;Speech/Language facilitation;Internal/external aids   Daily Session  Skilled Therapeutic Interventions: Skilled treatment session focused on dysphagia and cognitive goals. SLP facilitated session by providing skilled observation with lunch  meal of Dys.  3 textures with thin liquids. Patient demonstrated efficient mastication and complete oral clearance without overt s/s of aspiration. Recommend patient continue current diet. Throughout session, patient required Min verbal cues for alternating attention between self-feeding and functional conversation that focused on recall of previous events. Patient left upright in bed with alarm on and all needs within reach. Continue with current plan of care.      Pain Pain Assessment Pain Scale: 0-10 Pain Score: 10-Worst pain ever Pain Type: Surgical pain;Acute pain Pain Location: Leg Pain Orientation: Right;Left Pain Descriptors / Indicators: Aching;Dull Pain Frequency: Intermittent Pain Onset: On-going Patients Stated Pain Goal: 3 Pain Intervention(s): Medication (See eMAR)  Therapy/Group: Individual Therapy  Letitia Sabala 05/20/2020, 6:26 AM

## 2020-05-20 NOTE — Progress Notes (Signed)
Physical Therapy Weekly Progress Note  Patient Details  Name: Jeremy George. MRN: [id number] Date of Birth: 10-27-1995  Beginning of progress report period: May 12, 2020 End of progress report period: May 20, 2020  Today's Date: 05/20/2020 PT Individual Time: 1055-1130 PT Individual Time Calculation (min): 35 min   Patient has met 4 of 4 short term goals.  Patient with significant progress this week with increased weight bearing on L lower extremity to WBAT. Patient currently requires mod-min A for all mobility, and ambulating up to 50 ft uisng a R loftstrand crutch.   Patient continues to demonstrate the following deficits muscle weakness and muscle joint tightness, decreased cardiorespiratoy endurance, impaired timing and sequencing and decreased motor planning, decreased attention, decreased awareness, decreased problem solving, decreased safety awareness and decreased memory and decreased sitting balance, decreased standing balance, decreased postural control, decreased balance strategies and difficulty maintaining precautions and therefore will continue to benefit from skilled PT intervention to increase functional independence with mobility.  Patient progressing toward long term goals..  Plan of care revisions: Added gait goals due to clearance fore WBAT on L LE.Marland Kitchen  PT Short Term Goals Week 3:  PT Short Term Goal 1 (Week 3): Pt will perform supine<>sit with mod assist of 1 person consistently PT Short Term Goal 1 - Progress (Week 3): Met PT Short Term Goal 2 (Week 3): Pt will perform bed<>chair transfers with mod assist of 1 person consistently in both directions PT Short Term Goal 2 - Progress (Week 3): Met PT Short Term Goal 3 (Week 3): Pt will perform sit<>stands using R UE support and mod assist of 1 person consistently PT Short Term Goal 3 - Progress (Week 3): Met PT Short Term Goal 4 (Week 3): Pt will propel wheelchair 49f with supervision PT Short Term Goal 4 -  Progress (Week 3): Progressing toward goal Week 4:  PT Short Term Goal 1 (Week 4): Patient will perform all transfers with min A consistently. PT Short Term Goal 2 (Week 4): Paitent will ambulate 50 ft using LRAD with min A consistently. PT Short Term Goal 3 (Week 4): Patient will improved knee extension ROM to <10 deg lacking form neutral.  Skilled Therapeutic Interventions/Progress Updates:     Patient in bed with his mother in the room upon PT arrival. Patient alert and agreeable to PT session. Patient reported 10/10 L lower extremity pain during session, RN made aware. PT provided repositioning, rest breaks, and distraction as pain interventions throughout session. Patient intolerant to touch or movement bed level at this time. Focused on diaphragmatic breathing, relaxation techniques, and guided imagery to reduce pain response. Progressed to therapeutic touch with desensitization and gentle massage with slow progression due to intermittent muscle spasms in response to increased touch. Patient relaxed without muscle guarding or spasm after.   Educated on importance of pain management, as patient reported he declined pain medicine last night. Also discussed patient's sleeping habits as he reports only sleeping a couple hours at night. Educated on good sleep hygiene for recovery, informed RN and MD and discussed implementing a sleep chart for patient. Recommended that patient turn off the TV between the hours of 10pm and 7am. Patient resistant to this suggestion due to habitual sleeping with the TV on PTA. Suggested patient listen to music at night to reduce distraction from sleep, patient in agreement and RN informed.   Patient reported decreased pain and requested to rest at this time due to lack of sleep last night.  Patient missed 10 min of skilled PT due to pain/fatigue, RN made aware. Will attempt to make-up missed time as able.    Patient in bed with his mother in the room at end of session with  breaks locked, bed alarm set, and all needs within reach.    Therapy Documentation Precautions:  Precautions Precautions: Fall Precaution Booklet Issued: No Precaution Comments: PEG, posterior hip precautions Restrictions Weight Bearing Restrictions: Yes RUE Weight Bearing: Weight bearing as tolerated LUE Weight Bearing: Weight bearing as tolerated LLE Weight Bearing: Weight bearing as tolerated General: PT Amount of Missed Time (min): 10 Minutes  Therapy/Group: Individual Therapy  Jurell Basista L Takeysha Bonk PT, DPT  05/20/2020, 2:11 PM

## 2020-05-20 NOTE — Progress Notes (Signed)
Speech Language Pathology Daily Session Note  Patient Details  Name: Jeremy George. MRN: [id number] Date of Birth: 1995-04-18  Today's Date: 05/20/2020 SLP Individual Time: 1400-1500 SLP Individual Time Calculation (min): 60 min  Short Term Goals: Week 4: SLP Short Term Goal 1 (Week 4): Patient will demonstrate functional problem solving for mildly complex and familiar tasks with Min A verbal cues. SLP Short Term Goal 2 (Week 4): Patient will recall 3 event/piece of relevant infromation daily with Mod A verbal cues. SLP Short Term Goal 3 (Week 4): Patient will demonstrate selective attention to functional tasks for 30 minutes with Min verbal cues for redirection. SLP Short Term Goal 4 (Week 4): Patient will self-monitor and correct errors during functional tasks with Mod A verbal cues. SLP Short Term Goal 5 (Week 4): Patient will consume current diet with minimal overt s/s of aspiration and Min verbal cues for use of swallowing compensatory strategies. SLP Short Term Goal 6 (Week 4): Patient will utilize speech intelligibility strategies at the sentence level to achieve ~90% intelligibility with supervision verbal cues.  Skilled Therapeutic Interventions:   Patient seen for skilled ST session focusing on cognitive function goals. He stated that he was feeling better from being in pain this morning. He completed several test questions from [employer] in the section Daily Math problems. He required modA cues to maintain attention and demonstrate awareness to errors. He would often not try a problem he thought he couldn't do, such as saying he couldn't do one because he would need his phone calculator but when cued, he was able to attempt. He was able to demonstrate improved awareness to errors for two of the problems, one of which he corrected without assistance and the other with minA. Patient continues to lack adequate awareness to deficits, such as him saying that he could go home but "you all  wont let me leave". Patient continues to benefit from skilled SLP intervention to maximize cognitive-linguistic and dysphagia abilities prior to discharge.  Pain Pain Assessment Pain Scale: 0-10 Faces Pain Scale: No hurt  Therapy/Group: Individual Therapy  Angela Nevin, MA, CCC-SLP Speech Therapy
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
## 2020-05-20 NOTE — Progress Notes (Signed)
Occupational Therapy Session Note  Patient Details  Name: Jeremy George. MRN: [id number] Date of Birth: 11/02/1995  Today's Date: 05/20/2020 OT Individual Time: 8833-7445 OT Individual Time Calculation (min): 45 min  and Today's Date: 05/20/2020 OT Missed Time: 15 Minutes Missed Time Reason: Pain   Short Term Goals: Week 4:  OT Short Term Goal 1 (Week 4): Pt will complete transfer to [facility] with CGA OT Short Term Goal 2 (Week 4): Pt will don shirt with min A OT Short Term Goal 3 (Week 4): Pt will demonstrate emergent awareness with mod cueing OT Short Term Goal 4 (Week 4): Pt will bathe at shower level with min A overall  Skilled Therapeutic Interventions/Progress Updates:    Pt supine with his mother present. Pt reporting high levels of pain that interfered with getting any sleep last night in his L knee and hip. [name] reporting he did not take pain medication last night either. Lengthy discussion re need for pain medication following sx and in this acute phase of recovery. Explained TBI medical complications and need for pt to take several medications as he expressed concerns taking "handfuls of drugs". Moist heat packs were applied to pt's L hip and knee. Skin monitored for reaction with no adverse effects. Gentle PROM applied to pt's L elbow, achieving 90 degrees of flexion with pain at end range. Elbow bledsoe brace applied and locked out at 80 degrees of flexion (within pt comfort). Pt drowsy and nodding off to sleep. 15 min missed d/t lethargy and pain. Pt left supine with all needs met. Bed alarm set, mother present.   Therapy Documentation Precautions:  Precautions Precautions: Fall,Posterior Hip Precaution Booklet Issued: No Precaution Comments: PEG, posterior hip precautions Restrictions Weight Bearing Restrictions: Yes RUE Weight Bearing: Weight bearing as tolerated LUE Weight Bearing: Weight bearing as tolerated LLE Weight Bearing: Weight bearing as  tolerated   Therapy/Group: Individual Therapy  Curtis Sites 05/20/2020, 6:20 AM
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
## 2020-05-21 MED ORDER — DIPHENHYDRAMINE HCL 25 MG PO CAPS
25.0000 mg | ORAL_CAPSULE | Freq: Four times a day (QID) | ORAL | Status: DC | PRN
  Administered 2020-05-21: 25 mg via ORAL
  Filled 2020-05-21: qty 1

## 2020-05-21 MED ORDER — ENSURE ENLIVE PO LIQD
237.0000 mL | ORAL | Status: DC
  Administered 2020-05-23 – 2020-05-28 (×3): 237 mL via ORAL
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
## 2020-05-21 NOTE — Progress Notes (Signed)
Speech Language Pathology Daily Session Note  Patient Details  Name: Jeremy George. MRN: [id number] Date of Birth: 1995-04-07  Today's Date: 05/21/2020 SLP Individual Time: 1400-1505 SLP Individual Time Calculation (min): 65 min  Short Term Goals: Week 4: SLP Short Term Goal 1 (Week 4): Patient will demonstrate functional problem solving for mildly complex and familiar tasks with Min A verbal cues. SLP Short Term Goal 2 (Week 4): Patient will recall 3 event/piece of relevant infromation daily with Mod A verbal cues. SLP Short Term Goal 3 (Week 4): Patient will demonstrate selective attention to functional tasks for 30 minutes with Min verbal cues for redirection. SLP Short Term Goal 4 (Week 4): Patient will self-monitor and correct errors during functional tasks with Mod A verbal cues. SLP Short Term Goal 5 (Week 4): Patient will consume current diet with minimal overt s/s of aspiration and Min verbal cues for use of swallowing compensatory strategies. SLP Short Term Goal 6 (Week 4): Patient will utilize speech intelligibility strategies at the sentence level to achieve ~90% intelligibility with supervision verbal cues.  Skilled Therapeutic Interventions: Skilled treatment session focused on cognitive and dysphagia goals. SLP facilitated session by providing extra time and overall supervision level verbal cues for problem solving during basic sink tasks. Patient constantly itching throughout session, RN made aware. Patient asking questions about current medications, SLP generated a list to maximize recall of functions. Patient also participated in a mildly complex medication management task with supervision verbal cues for problem solving. Patient consumed lunch meal of Dys. 3 textures with thin liquids without overt s/s of aspiration with Min verbal cues needed for attention to task. Patient transferred back to bed at end of session and left in bed with alarm on and all needs within reach.  Continue with current plan of care.      Pain Pain Assessment Pain Scale: 0-10 Pain Score: 5  Pain Location: Knee Pain Orientation: Left Pain Intervention(s): Medication (See eMAR)  Therapy/Group: Individual Therapy   Jessy Cybulski 05/21/2020, 3:28 PM

## 2020-05-21 NOTE — Progress Notes (Signed)
Orthopaedic Trauma Progress Note  SUBJECTIVE: Patient doing well today, just got done working with OT and showering. Using the bledsoe elbow brace to improve elbow flexion. Having pain in left knee with ambulation but is motivated to work with therapies and get home. Having a lot of hamstring tightness.  Scheduled d/c date is 06/02/20.   OBJECTIVE:  Vitals:   05/21/20 0754 05/21/20 1210  BP: 112/76 116/65  Pulse: 98 87  Resp: 17 17  Temp: 98.6 F (37 C) 98.3 F (36.8 C)  SpO2: 100% 100%    General: Sitting up on EOB. No acute distress Respiratory: No increased work of breathing.  LUE: Incision clean, dry, intact.  New Kerlex dressing applied.  About 15 degrees shy of full elbow extension. Flexion to 90 degrees.  Compartments soft and compressible.  Able to wiggle fingers and squeeze my hand. 2+ Radial pulse  LLE: Incision over posterior lateral hip is clean, dry, intact with Dermabond in place.  No significant tenderness with palpation over the hip but does endorse some discomofrt over distal thigh.  Tolerates gentle hip and knee motion.  Ankle dorsiflexion/plantarflexion is intact.  Wiggles toes.  Compartments compressible. + DP pulse  IMAGING: Postop imaging of left elbow stable with graft in place  LABS:  No results found for this or any previous visit (from the past 24 hour(s)).  ASSESSMENT: Jeremy George. is a 25 y.o. male, 2 days postop s/p removal of antibiotic cement and repair of left distal humerus nonunion.   Other injuries include: 1. Right clavicle fracture s/p ORIF 04/03/20 2. Left posterior wall acetabular fracture/dislocation s/p ORIF 04/01/20 4. Left Monteggia fracture/dislocation s/p ORIF 04/01/20   PLAN: Weightbearing:  NWB LUE, WBAT LLE, WBAT RUE. ROM: Unrestricted AROM/PROM bilateral upper and lower extremities. Discontinue posterior hip precautions LLE Incisional and dressing care: LLE incision may be left open to air.  Change LUE dressing PRN, ok to leave  open to air if no drainage Orthopedic device(s): None needed Pain management: per primary team VTE prophylaxis: Lovenox, SCDs Impediments to Fracture Healing: Polytrauma, significant bone loss. Vit D level 12, continue supplementation  Dispo: Continue therapies per CIR team. Continue aggressive elbow ROM with current bledsoe brace. If OT feels he is not making progress will order JAS static progressive splint.    Follow - up plan: We will continue to follow patient while in hospital. Plan for outpatient follow-up with Dr. [name] 2 weeks after discharge.   Contact information:  Truitt Merle MD, Ulyses Southward PA-C. After hours and holidays please check [email or web address] for group call information for [facility]   Ramandeep Arington A. Michaelyn Barter, PA-C [phone] (office) [email or web address]

## 2020-05-21 NOTE — Progress Notes (Signed)
Orthopedic Tech Progress Note Patient Details:  Jeremy George. [birth year] [id number] PA called stating patient received JAS elbow brace and that was incorrect. Ortho called [facility] to order static progressive splint. Patient ID: Jeremy George., male   DOB: December 31, 1995, 25 y.o.   MRN: [id number]   [name] 05/21/2020, 2:18 PM

## 2020-05-21 NOTE — Progress Notes (Addendum)
Patient ID: Jeremy George., male   DOB: 01-09-1996, 25 y.o.   MRN: [id number]  SW waiting on follow-up from [name] [phone] [phone]; email:[email or web address]) about application status; emailed application again (3/24 and 3/31).   SW received phone call from [name]/[employer] [phone]) who reports received short term disability extension form, and wanted to verify pt was still in hospital, and pt dc date so his short term disability can be extended. SW left message for [name] informing on pt current admission, and d/c date 4/12. SW encouraged follow-up if needed.   Cecile Sheerer, MSW, LCSWA Office: [phone] Cell: [phone] Fax: [phone]

## 2020-05-21 NOTE — Progress Notes (Addendum)
Physical Therapy Session Note  Patient Details  Name: Jeremy George. MRN: [id number] Date of Birth: [birth year]  Today's Date: 05/21/2020 PT Individual Time: 0800-0905 PT Individual Time Calculation (min): 65 min   Short Term Goals: Week 4:  PT Short Term Goal 1 (Week 4): Patient will perform all transfers with min A consistently. PT Short Term Goal 2 (Week 4): Paitent will ambulate 50 ft using LRAD with min A consistently. PT Short Term Goal 3 (Week 4): Patient will improved knee extension ROM to <10 deg lacking form neutral.  Skilled Therapeutic Interventions/Progress Updates:     Patient in bed drawing upon PT arrival. Patient alert and agreeable to PT session. Patient reported 5-6/10 L hip pain, denied L elbow pain during session, RN made aware. PT provided repositioning, rest breaks, and distraction as pain interventions throughout session. Patient reported improved sleep, stated he did turn on the TV, but left it on low volume and was not distracted by it last night. He also reports that his pain is much better controlled today.   Patient had removed his ACE wrap and some of his dressing on his L arm, RN made aware and came in to redress his arm with patient sitting EOB. PT applied ACE wrap over dressing and donned Bledsoe brace with total A. Patient's mother arrived during dressing change, educated her on donning/doffing and locking and unlocking the brace.   Therapeutic Activity: Bed Mobility: Patient performed supine to sit spontaneously with supervision with HOB elevated. Provided verbal cues for safety awareness for waiting for therapist for getting OOB, patient followed cues appropriately. Donned socks and shoes with min A on the R and total A on the L due to decreased L hip flexion.  Transfers: Patient performed stand pivot transfers bed>w/c with R Loftstrand crutch and mat>w/c and w/c>bed with use of R arm rest or bed rail only with min A-CGA for all transfers. Provided cues  for hand placement and weight shift to the R for improved tolerance with L weight bearing. He performed sit to/from stand x3 with CGA pushing up from the R arm rest of the w/c then grabbing R Loftstrand crutch from therapist for balance once in standing. Provided verbal cues for sequencing and R weight shift for improved tolerance with standing.  Gait Training:  Patient ambulated 20 feet with 2-180 deg turns, 35 ft without turns, and 10 feet to/from the bathroom using R Loftstrand crutch with min A-CGA. Ambulated with step-to with intermittent step through gait pattern, decreased L weight shift, L knee/hip extension, and R trunk lean. Demonstrated good sequencing with use of R Lofstrand. Provided verbal cues for increased weight shift and improved placement of Lofstrand to better off-weight his L leg due to pain with increased weight bearing.   Therapeutic Exercise: Patient performed the following exercises with verbal and tactile cues for proper technique. -elbow flexion/extension PROM progressing to [facility] prior to dressing change, progressed to >90 degrees flexion, educated patient on self-assist ROM, patient reports pain not limiting, just tightness with noted muscle guarding -L seated hamstring stretch 2x1 min -L LAQ AROM 2x10 with 3 sec hold in full extension  Patient sitting TIS w/c with L leg elevated on the bed for improved knee extension ROM, however, patient reported feeling too uncomfortable to eat in this position and requested to return to bed. Patient sitting EOB with his mother in the room at end of session with breaks locked, bed alarm set, and all needs within reach.  Therapy Documentation Precautions:  Precautions Precautions: Fall Precaution Booklet Issued: No Precaution Comments: PEG, posterior hip precautions Restrictions Weight Bearing Restrictions: Yes RUE Weight Bearing: Weight bearing as tolerated LUE Weight Bearing: Non-weight bearing (cleared for AROM) LLE Weight  Bearing: Weight bearing as tolerated (cleared from hip precautions)   Therapy/Group: Individual Therapy  Anzley Dibbern L Danilynn Jemison PT, DPT  05/21/2020, 12:55 PM

## 2020-05-21 NOTE — Progress Notes (Signed)
Foley at peg tube site removed as ordered. Pt tolerated well. Cleansed and gauze dressed over site. No complications noted. Site is pink and non edematous. Small drainage from stomach noted.   Mylo Red, LPN

## 2020-05-21 NOTE — Progress Notes (Signed)
Occupational Therapy Session Note  Patient Details  Name: Jeremy George. MRN: [id number] Date of Birth: 03-30-1995  Today's Date: 05/21/2020 OT Individual Time: 1300-1400 OT Individual Time Calculation (min): 60 min    Short Term Goals: Week 1:  OT Short Term Goal 1 (Week 1): Pt will sit EOB with max A during ADLs OT Short Term Goal 1 - Progress (Week 1): Met OT Short Term Goal 2 (Week 1): Pt will attend to oral care task with no more than mod cueing OT Short Term Goal 2 - Progress (Week 1): Met OT Short Term Goal 3 (Week 1): Pt will wash UB with max A OT Short Term Goal 3 - Progress (Week 1): Met  Skilled Therapeutic Interventions/Progress Updates:    1:1. Pt received in bed agreeable to OT. Pt requesting to shower. Pt completes functional ambulation in room with 1 arm crutch with CGA into and out of bathroom. Pt undresses using reacher with min cuing. Throughtous ession pt perseverates on itching and picking at skin/patches. Pt bathes 10/10 body parts with CGA for standing balance and VC for compensatory strategy to use LHSS to wash B feet and R armpit d/t decreased elbow ROM. Ortho PA arrived and pt requries VC for pacing to finish shower/dry and total A for LB dressing d/t PA wanting to see elbow. Applied bledslow brace to LUE however unable to get into flexion d/t edema/large wrapping of krillex. Messaged PA to provide alternative brace. Exited session with pt seated with Direct handoff to SLP    Therapy Documentation Precautions:  Precautions Precautions: Fall Precaution Booklet Issued: No Precaution Comments: PEG, posterior hip precautions Restrictions Weight Bearing Restrictions: Yes RUE Weight Bearing: Weight bearing as tolerated LUE Weight Bearing: Weight bearing as tolerated RLE Weight Bearing: Weight bearing as tolerated LLE Weight Bearing: Weight bearing as tolerated General:   Vital Signs: Therapy Vitals Temp: 98.8 F (37.1 C) Temp Source: Oral Pulse  Rate: (!) 108 Resp: 16 BP: 124/74 Patient Position (if appropriate): Lying Oxygen Therapy SpO2: 100 % O2 Device: Room Air Pain: Pain Assessment Pain Scale: 0-10 Pain Score: 7  Pain Type: Acute pain Pain Location: Hip Pain Orientation: Left Pain Descriptors / Indicators: Aching Pain Frequency: Intermittent Pain Onset: On-going Patients Stated Pain Goal: 2 Pain Intervention(s): Medication (See eMAR) ADL: ADL Eating: Dependent Where Assessed-Eating: Edge of bed Grooming: Maximal assistance Where Assessed-Grooming: Edge of bed Upper Body Bathing: Unable to assess Lower Body Bathing: Unable to assess Upper Body Dressing: Unable to assess Lower Body Dressing: Unable to assess Toileting: Unable to assess Toilet Transfer: Unable to assess Tub/Shower Transfer: Unable to assess Gaffer Transfer: Unable to assess Social research officer, government Method: Unable to assess Vision   Perception    Praxis   Exercises:   Other Treatments:     Therapy/Group: Individual Therapy  Tonny Branch 05/21/2020, 6:51 AM

## 2020-05-21 NOTE — Progress Notes (Signed)
Pt c/o generalized itching. No rash noted. PA Dan notified, new orders received. Mylo Red, LPN

## 2020-05-22 MED ORDER — QUETIAPINE FUMARATE 50 MG PO TABS
50.0000 mg | ORAL_TABLET | Freq: Every morning | ORAL | Status: DC
Start: 2020-05-23 — End: 2020-05-25
  Administered 2020-05-23 – 2020-05-25 (×3): 50 mg via ORAL
  Filled 2020-05-22 (×3): qty 1

## 2020-05-22 MED ORDER — QUETIAPINE FUMARATE 50 MG PO TABS
250.0000 mg | ORAL_TABLET | Freq: Every day | ORAL | Status: DC
  Administered 2020-05-22 – 2020-05-24 (×3): 250 mg via ORAL
  Filled 2020-05-22 (×3): qty 1

## 2020-05-22 NOTE — Progress Notes (Incomplete)
Pt is calm and not impulsive. No complications noted.  Mylo Red, LPN
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
## 2020-05-22 NOTE — Progress Notes (Signed)
Telesitter discontinued. Pt tolerating well. Pt is calm and not Impulsive at this time. Pt mother at bedside.  Pt getting up to shower with OT. Mylo Red, LPN

## 2020-05-22 NOTE — Progress Notes (Signed)
Occupational Therapy Session Note  Patient Details  Name: Jeremy George. MRN: [id number] Date of Birth: 1995-04-10  Today's Date: 05/22/2020 OT Individual Time: 0131-4388 OT Individual Time Calculation (min): 70 min    Short Term Goals: Week 1:  OT Short Term Goal 1 (Week 1): Pt will sit EOB with max A during ADLs OT Short Term Goal 1 - Progress (Week 1): Met OT Short Term Goal 2 (Week 1): Pt will attend to oral care task with no more than mod cueing OT Short Term Goal 2 - Progress (Week 1): Met OT Short Term Goal 3 (Week 1): Pt will wash UB with max A OT Short Term Goal 3 - Progress (Week 1): Met  Skilled Therapeutic Interventions/Progress Updates:    1:1. Pt and mom in shower when OT enters room! Pt mother stated she thought the RN cleared them to shower and OT clarified that therapy needs to be the one to cover transfers/bathing. Pt mother kept supervising shower with OT present. Mom stated pt with increased pain and near fall walking to bathroom. OT reviews chair features and clears mom for stand pivots, but no walking in room. Pt finishes bathing with cuing from mom for LHSS use for multiple body part washing. Pt able to stand with mom providing MIN A to wash buttocks. Pt dresses at sink with increased pain and fatigue. Vitals WNL. Ot clarified transfer status with RN. Mother able ot provide reacher for pt to complete donning pants sit to stand with MIN A from mom at sink. Pt and OT use sock aide with MOD A to don sock and RN brings pain medicaiton for increasing R hip pain. Pt resides in w/c at end of session tilted back, exit alarm on and mom in room.   Therapy Documentation Precautions:  Precautions Precautions: Fall Precaution Booklet Issued: No Precaution Comments: PEG, posterior hip precautions Restrictions Weight Bearing Restrictions: Yes RUE Weight Bearing: Weight bearing as tolerated LUE Weight Bearing: Non weight bearing RLE Weight Bearing: Weight bearing as  tolerated LLE Weight Bearing: Weight bearing as tolerated General:   Vital Signs: Therapy Vitals Temp: 98.2 F (36.8 C) Temp Source: Oral Pulse Rate: 100 Resp: 16 BP: 123/75 Patient Position (if appropriate): Lying Oxygen Therapy SpO2: 100 % O2 Device: Room Air Pain: Pain Assessment Pain Score: Asleep ADL: ADL Eating: Dependent Where Assessed-Eating: Edge of bed Grooming: Maximal assistance Where Assessed-Grooming: Edge of bed Upper Body Bathing: Unable to assess Lower Body Bathing: Unable to assess Upper Body Dressing: Unable to assess Lower Body Dressing: Unable to assess Toileting: Unable to assess Toilet Transfer: Unable to assess Tub/Shower Transfer: Unable to assess Gaffer Transfer: Unable to assess Social research officer, government Method: Unable to assess Vision   Perception    Praxis   Exercises:   Other Treatments:     Therapy/Group: Individual Therapy  Tonny Branch 05/22/2020, 6:48 AM

## 2020-05-22 NOTE — Progress Notes (Signed)
Physical Therapy Session Note  Patient Details  Name: Jeremy George. MRN: [id number] Date of Birth: 1995/11/14  Today's Date: 05/22/2020 PT Individual Time: 8527-7824 PT Individual Time Calculation (min): 40 min   Short Term Goals: Week 4:  PT Short Term Goal 1 (Week 4): Patient will perform all transfers with min A consistently. PT Short Term Goal 2 (Week 4): Paitent will ambulate 50 ft using LRAD with min A consistently. PT Short Term Goal 3 (Week 4): Patient will improved knee extension ROM to <10 deg lacking form neutral.  Skilled Therapeutic Interventions/Progress Updates:     Patient in bed upon PT arrival. Patient alert and agreeable to PT session. Patient reported 7-10/10 L hip pain with visible muscle spasm during session, RN made aware, reports patient received pain med and a muscle relaxer ~1 hour prior to session. PT provided repositioning, rest breaks, and distraction as pain interventions throughout session.   Initiated bed level exercises and relaxation techniques for improved pain tolerance and muscle spasm with minimal relief and poor patient tolerance lying in supine due to hip/knee extension stretch. Patient requested to sit EOB for improved pain tolerance and focus on mobility due to LOB x2 in previous sessions per patient report.   Therapeutic Activity: Bed Mobility: Patient performed supine to/from sit with mod I in a flat bed without use of bed rail or L upper extremity. Sitting EOB educated patient on donning/doffing Bledsoe brace to promote L elbow flexion. Patient missing 1 Velcro attachment, provided with replacement Velcro and patient donned, doffed, then re-donned brace sitting EOB with min cues for placement and locking mechanism. Patient tolerated locking brace at 90 degrees flexion this session. Educated on wearing brace as much as tolerated. Suggested 1 hour on/off schedule and progressing to longer on time as tolerated due to poor compliance prior to  session. Patient stated understanding. Donned B socks with set-up assist for R and total A for L due to limited hip flexion tolerance.  Transfers: Patient performed sit to/from stand x2 with min A using R Loftstrand crutch. Noted patient with TDWB on L only and poor tolerance with weight bearing today, also increased L knee and hip flexion in standing. Patient initiated sitting <30 sec each trial due to pain. Educated on bone graft retrieval during surgery and increased pain from this. Patient reports missing 1 dose of pain medicine over night and that the pain has not been controlled since this. Encouraged patient to ask for pain medication whenever available to not get behind on pain management at this time for improved tolerance during therapies, RN made aware and patient in agreement.   Patient in bed at end of session with breaks locked, bed alarm set, and all needs within reach.    Therapy Documentation Precautions:  Precautions Precautions: Fall Precaution Booklet Issued: No Precaution Comments: PEG, posterior hip precautions Restrictions Weight Bearing Restrictions: Yes RUE Weight Bearing: Weight bearing as tolerated LUE Weight Bearing: Non weight bearing RLE Weight Bearing: Weight bearing as tolerated LLE Weight Bearing: Weight bearing as tolerated   Therapy/Group: Individual Therapy  Elfrieda Espino L Cassy Sprowl PT, DPT  05/22/2020, 5:00 PM

## 2020-05-22 NOTE — Progress Notes (Signed)
Speech Language Pathology Daily Session Note  Patient Details  Name: Jeremy George. MRN: [id number] Date of Birth: 1995-06-15  Today's Date: 05/22/2020 SLP Individual Time: 8295-6213 SLP Individual Time Calculation (min): 55 min  Short Term Goals: Week 4: SLP Short Term Goal 1 (Week 4): Patient will demonstrate functional problem solving for mildly complex and familiar tasks with Min A verbal cues. SLP Short Term Goal 2 (Week 4): Patient will recall 3 event/piece of relevant infromation daily with Mod A verbal cues. SLP Short Term Goal 3 (Week 4): Patient will demonstrate selective attention to functional tasks for 30 minutes with Min verbal cues for redirection. SLP Short Term Goal 4 (Week 4): Patient will self-monitor and correct errors during functional tasks with Mod A verbal cues. SLP Short Term Goal 5 (Week 4): Patient will consume current diet with minimal overt s/s of aspiration and Min verbal cues for use of swallowing compensatory strategies. SLP Short Term Goal 6 (Week 4): Patient will utilize speech intelligibility strategies at the sentence level to achieve ~90% intelligibility with supervision verbal cues.  Skilled Therapeutic Interventions: Skilled treatment session focused on cognitive goals. Upon arrival, patient was in pain while sitting up in the tilt-n-space wheelchair. Patient requested to get back to bed but agreeable to participate in treatment session. Patient performed a stand pivot with extra time and increased assistance needed due to pain. SLP facilitated session by providing extra time and overall Max A verbal cues for complex problem solving during a scheduling task. Patient's difficulty with task appeared to be due to decreased mental flexibility. Also suspect pain and poor attention played a role as well. Patient left upright in bed with alarm on and all needs within reach. Continue with current plan of care.      Pain 7/10 in left hip RN aware, patient  premedicated and repositioned   Therapy/Group: Individual Therapy  [name] 05/22/2020, 2:45 PM

## 2020-05-23 DIAGNOSIS — M25629 Stiffness of unspecified elbow, not elsewhere classified: Secondary | ICD-10-CM

## 2020-05-23 DIAGNOSIS — R252 Cramp and spasm: Secondary | ICD-10-CM

## 2020-05-23 MED ORDER — BACLOFEN 5 MG HALF TABLET
5.0000 mg | ORAL_TABLET | Freq: Four times a day (QID) | ORAL | Status: DC
  Administered 2020-05-23 – 2020-05-25 (×8): 5 mg via ORAL
  Filled 2020-05-23 (×8): qty 1
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
## 2020-05-24 NOTE — Progress Notes (Signed)
Speech Language Pathology Daily Session Note  Patient Details  Name: Jeremy George. MRN: [id number] Date of Birth: Mar 16, 1995  Today's Date: 05/24/2020 SLP Individual Time: 1448-1530 SLP Individual Time Calculation (min): 42 min  Short Term Goals: Week 4: SLP Short Term Goal 1 (Week 4): Patient will demonstrate functional problem solving for mildly complex and familiar tasks with Min A verbal cues. SLP Short Term Goal 2 (Week 4): Patient will recall 3 event/piece of relevant infromation daily with Mod A verbal cues. SLP Short Term Goal 3 (Week 4): Patient will demonstrate selective attention to functional tasks for 30 minutes with Min verbal cues for redirection. SLP Short Term Goal 4 (Week 4): Patient will self-monitor and correct errors during functional tasks with Mod A verbal cues. SLP Short Term Goal 5 (Week 4): Patient will consume current diet with minimal overt s/s of aspiration and Min verbal cues for use of swallowing compensatory strategies. SLP Short Term Goal 6 (Week 4): Patient will utilize speech intelligibility strategies at the sentence level to achieve ~90% intelligibility with supervision verbal cues.  Skilled Therapeutic Interventions: Pt seen for skilled ST focusing on cognitive goals, pt mother present throughout. Pt oriented with 100% accuracy, states he wants to d/c 4/10 vs 4/12. SLP informing pt his d/c plans and timing will be discussed this week during the care conference. SLP initiating medication management task by providing patient with current medication list to increase awareness of current meds and rationale behind each. Pt able to independently recall ~50% of current medications, will benefit from ongoing training (mother able to assist with med management as needed at d/c). Pt completing mildly complex divided attention task, benefiting from visual aid to increase accuracy. Pt reports the task was "hard" even though he performed ~80% accuracy with use of  aid. Pt left in bed with all needs met and mother present. Cont ST POC.   Pain Pain Assessment Pain Scale: 0-10 Pain Score: 5  Pain Location: Hip Pain Intervention(s): Repositioned  Therapy/Group: Individual Therapy  Jeremy George 05/24/2020, 3:37 PM

## 2020-05-24 NOTE — Progress Notes (Signed)
Physical Therapy Session Note  Patient Details  Name: Jeremy George. MRN: [id number] Date of Birth: 1995/11/01  Today's Date: 05/24/2020 PT Individual Time: 1108-1205 PT Individual Time Calculation (min): 57 min   Short Term Goals: Week 4:  PT Short Term Goal 1 (Week 4): Patient will perform all transfers with min A consistently. PT Short Term Goal 2 (Week 4): Paitent will ambulate 50 ft using LRAD with min A consistently. PT Short Term Goal 3 (Week 4): Patient will improved knee extension ROM to <10 deg lacking form neutral.  Skilled Therapeutic Interventions/Progress Updates:     Patient sitting EOB with his mother in the room upon PT arrival. Patient alert and agreeable to PT session. Patient reported 7-10/10 L hip pain with intermittent spasms during session, RN made aware and reports patient was premedicated prior to session. PT provided repositioning, rest breaks, and distraction as pain interventions throughout session. Applied hot pack to hip at end of session, RN to remove when Lidocaine patch to be placed.    Patient reported dropping a drink bottle on his L elbow using the reacher last night, increasing L upper extremity pain. Educated on safety awareness and asking for assistance when something is not safe for him to do by himself, patient stated understanding. Patient also reported poor sleep from "tossing and turning" from L hip pain throughout the night. Reviewed sleeping positions with pillow between knees in side-lying and positioning the bed for improved sleep and pain management.   Therapeutic Activity: Bed Mobility: Patient performed supine to/from sit with min A for L leg on mat table. Provided verbal cues for not using L arm x1. Transfers: Patient performed squat pivots bed<>w/c and mat table<>w/c with min A-CGA. Provided cues for transferring to the R whenever possible for reduced weight bearing on L leg. He performed sit to/from stand x2 with min A using R  Loftstrand crutch. Provided verbal cues for R weight shift for reduced L hip pain.  Gait Training:  Patient ambulated 36 feet and 42 feet using R Loftstrand crutch with min A. Ambulated with step-to with intermittent step-through gait pattern with antalgic gait on L, increased hip and knee flexion L>R, and downward head gaze throughout. Provided verbal cues for erect posture, sequencing and placement of crutch to off-weight his L leg in stance, and safety with turning 180 deg.  Wheelchair Mobility:  Changed out manual w/c for 16"x17" manual w/c with standard cushion and B ELRs for improved w/c propulsion and sitting tolerance. Adjusted ELRs and seat depth during session.   Neuromuscular Re-ed: Patient performed the following activities: -standing balance with focus on straightening L hip and knee with full contact of L foot on the floor, use of mirror for visual feedback 2x1 min  Therapeutic Exercise: Patient performed the following exercises with verbal and tactile cues for proper technique. -L heel slides with progressive knee/hip extension, lacking ~20 deg following 2 sets of 10 -L hamstring stretch in supine, progressing to full knee extension 2x30 sec -prolonged lying in supine for L hip extension stretch, patient unable to tolerate >10 sec without significant hip flexor muscle spasm -side-lying hip flexor stretch with knee bent, progressed to neutral 4x30 sec, limited patient tolerance at neutral  Patient sitting EOB with his mother in the room at end of session with breaks locked, bed alarm set, and all needs within reach.    Therapy Documentation Precautions:  Precautions Precautions: Fall Precaution Booklet Issued: No Precaution Comments: PEG, posterior hip precautions Restrictions Weight  Bearing Restrictions: Yes RUE Weight Bearing: Weight bearing as tolerated LUE Weight Bearing: Non weight bearing RLE Weight Bearing: Weight bearing as tolerated LLE Weight Bearing: Weight  bearing as tolerated   Therapy/Group: Individual Therapy  Giannamarie Paulus L Bernette Seeman PT, DPT  05/24/2020, 4:25 PM

## 2020-05-25 LAB — CBC
HCT: 29.2 % — ABNORMAL LOW (ref 39.0–52.0)
Hemoglobin: 9.4 g/dL — ABNORMAL LOW (ref 13.0–17.0)
MCH: 26.9 pg (ref 26.0–34.0)
MCHC: 32.2 g/dL (ref 30.0–36.0)
MCV: 83.7 fL (ref 80.0–100.0)
Platelets: 415 10*3/uL — ABNORMAL HIGH (ref 150–400)
RBC: 3.49 MIL/uL — ABNORMAL LOW (ref 4.22–5.81)
RDW: 14.6 % (ref 11.5–15.5)
WBC: 6.8 10*3/uL (ref 4.0–10.5)
nRBC: 0 % (ref 0.0–0.2)

## 2020-05-25 LAB — COMPREHENSIVE METABOLIC PANEL
ALT: 9 U/L (ref 0–44)
AST: 12 U/L — ABNORMAL LOW (ref 15–41)
Albumin: 2.7 g/dL — ABNORMAL LOW (ref 3.5–5.0)
Alkaline Phosphatase: 178 U/L — ABNORMAL HIGH (ref 38–126)
Anion gap: 13 (ref 5–15)
BUN: 6 mg/dL (ref 6–20)
CO2: 22 mmol/L (ref 22–32)
Calcium: 8.9 mg/dL (ref 8.9–10.3)
Chloride: 101 mmol/L (ref 98–111)
Creatinine, Ser: 0.5 mg/dL — ABNORMAL LOW (ref 0.61–1.24)
GFR, Estimated: >60 mL/min (ref >60)
Glucose, Bld: 93 mg/dL (ref 70–99)
Potassium: 3.9 mmol/L (ref 3.5–5.1)
Sodium: 136 mmol/L (ref 135–145)
Total Bilirubin: 0.6 mg/dL (ref 0.3–1.2)
Total Protein: 7.2 g/dL (ref 6.5–8.1)

## 2020-05-25 MED ORDER — QUETIAPINE FUMARATE 200 MG PO TABS
200.0000 mg | ORAL_TABLET | Freq: Every day | ORAL | Status: DC
  Administered 2020-05-25 – 2020-05-27 (×3): 200 mg via ORAL
  Filled 2020-05-25 (×3): qty 1

## 2020-05-25 MED ORDER — BACLOFEN 5 MG HALF TABLET
5.0000 mg | ORAL_TABLET | Freq: Three times a day (TID) | ORAL | Status: DC
  Administered 2020-05-25 – 2020-06-01 (×20): 5 mg via ORAL
  Filled 2020-05-25 (×21): qty 1

## 2020-05-25 MED ORDER — QUETIAPINE FUMARATE 25 MG PO TABS
25.0000 mg | ORAL_TABLET | Freq: Every morning | ORAL | Status: DC
  Administered 2020-05-26 – 2020-05-28 (×3): 25 mg via ORAL
  Filled 2020-05-25 (×3): qty 1
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
## 2020-05-25 NOTE — Progress Notes (Signed)
Patient ID: Jeremy Newton., male   DOB: [birth year], 25 y.o.   MRN: [id number]   Patient/Family Conference  Patient/family in attendance: Jeremy George (mother) and Jeremy Sr. (father).  Staff in attendance: Dr. Riley Kill  (attending), Jake Shark (OT), Feliberto Gottron (SLP), Serina Cowper (PT), Cecile Sheerer (SW)  Main focus: to discuss pt current gains in rehab, and support pt will require at discharge  Synopsis of information shared: Pt will continue to require assistance with ADLs/IADLs due to WB restrictions, and cognition (emotions/awareness/memory/thinking)  Barriers/concerns expressed by patient and family: Ensuring that she will continue to be able to manage his care needs when he returns home, and ensuring he doe snot revert to previous behaviors.   Patient/family response: Family is prepared to provide the support pt will require. His mother will remain primary caregiver.  Follow-up/action plans: Family education scheduled for Friday 4/8 and Mon 4/11 10am-12pm/1pm-3pm. Sw will continue to work on TBI funding in [place] with [employer] since pt is uninsured.    Cecile Sheerer, MSW, LCSWA Office: [phone] Cell: [phone] Fax: [phone]

## 2020-05-25 NOTE — Progress Notes (Signed)
Speech Language Pathology Daily Session Note  Patient Details  Name: Jeremy George. MRN: [id number] Date of Birth: 1995/12/04  Today's Date: 05/25/2020  Session 1: SLP Individual Time: 0935-1000 SLP Individual Time Calculation (min): 25 min   Session 2: SLP Individual Time: 1300-1330 SLP Individual Time Calculation (min):  min  Short Term Goals: Week 4: SLP Short Term Goal 1 (Week 4): Patient will demonstrate functional problem solving for mildly complex and familiar tasks with Min A verbal cues. SLP Short Term Goal 2 (Week 4): Patient will recall 3 event/piece of relevant infromation daily with Mod A verbal cues. SLP Short Term Goal 3 (Week 4): Patient will demonstrate selective attention to functional tasks for 30 minutes with Min verbal cues for redirection. SLP Short Term Goal 4 (Week 4): Patient will self-monitor and correct errors during functional tasks with Mod A verbal cues. SLP Short Term Goal 5 (Week 4): Patient will consume current diet with minimal overt s/s of aspiration and Min verbal cues for use of swallowing compensatory strategies. SLP Short Term Goal 6 (Week 4): Patient will utilize speech intelligibility strategies at the sentence level to achieve ~90% intelligibility with supervision verbal cues.  Skilled Therapeutic Interventions:  Session 1: Skilled treatment session focused on cognitive goals. SLP facilitated session by providing extra time and Min verbal cues for problem solving during a complex scheduling task. Patient demonstrated selective attention to task for ~15 minutes with Mod I. Both the patient and his mother report that patient is demonstrating increased use of strategies when overt stimulated to reduce frustration and agitation independently! Patient left supine in bed with alarm on and all needs within reach. Continue with current plan of care.   Session 2: Skilled treatment session focused on cognitive goals. Upon arrival, patient was asleep in  bed but easily awakened. SLP administered the [facility] Mental Status Examination (SLUMS) and patient scored 26 /30 points with a score of 27 or above considered normal. Patient demonstrated deficits in complex problem solving. Patient requested to use the bathroom but declined to get out of bed and used the urinal. Patient continent of bladder. Patient left upright in bed with alarm on and all needs within reach. Continue with current plan of care.       Pain Pain Assessment Pain Scale: 0-10 Pain Score: 8  Faces Pain Scale: No hurt Pain Type: Acute pain Pain Location: Hip Pain Orientation: Left  Therapy/Group: Individual Therapy  Jaiyon Wander 05/25/2020, 11:52 AM

## 2020-05-25 NOTE — Progress Notes (Signed)
Physical Therapy Session Note  Patient Details  Name: Jeremy George. MRN: [id number] Date of Birth: 06/20/1995  Today's Date: 05/25/2020 PT Missed Time: 45 Minutes Missed Time Reason: Pain  Short Term Goals: Week 4:  PT Short Term Goal 1 (Week 4): Patient will perform all transfers with min A consistently. PT Short Term Goal 2 (Week 4): Paitent will ambulate 50 ft using LRAD with min A consistently. PT Short Term Goal 3 (Week 4): Patient will improved knee extension ROM to <10 deg lacking form neutral.  Skilled Therapeutic Interventions/Progress Updates:     Pt lying on R side upon PT entry. Mother present. Pt says he cannot participate at this time due to significant L hip pain. RN aware. PT will follow up as appropriate.  Therapy Documentation Precautions:  Precautions Precautions: Fall Precaution Booklet Issued: No Precaution Comments: PEG, posterior hip precautions Restrictions Weight Bearing Restrictions: Yes RUE Weight Bearing: Weight bearing as tolerated LUE Weight Bearing: Non weight bearing RLE Weight Bearing: Weight bearing as tolerated LLE Weight Bearing: Weight bearing as tolerated    Therapy/Group: Individual Therapy  Beau Fanny, PT, DPT 05/25/2020, 4:04 PM

## 2020-05-25 NOTE — Progress Notes (Signed)
Occupational Therapy Session Note  Patient Details  Name: [name]. MRN: [id number] Date of Birth: 1995/12/03  Today's Date: 05/25/2020 OT Individual Time: 3419-3790 OT Individual Time Calculation (min): 45 min    Short Term Goals: Week 4:  OT Short Term Goal 1 (Week 4): Pt will complete transfer to [facility] with CGA OT Short Term Goal 2 (Week 4): Pt will don shirt with min A OT Short Term Goal 3 (Week 4): Pt will demonstrate emergent awareness with mod cueing OT Short Term Goal 4 (Week 4): Pt will bathe at shower level with min A overall  Skilled Therapeutic Interventions/Progress Updates:    Pt sidelying c/o L hip pain with request to take shower for pain relief. Pt completed bed mobility to EOB with close supervision. Pt completed ambulatory transfer with R loftstrand crutch and CGA overall. Cueing for LLE positioning and to increase hip and knee extension. Pt completed toileting tasks with min A overall, transferring to [facility] over toilet for increased height. Pt still requires cueing for adherence to LUE NWB. Pt voided BM and was able to complete all hygiene with CGA. He transferred into shower with min A. He was able to complete all bathing seated (lateral leans for peri areas) with close supervision, occasional CGA for heavy lateral leans. Pt completed UB dressing with min A. Min A for LB as well, to thread over LLE. Pt was left sitting EOB with his mother present and supervising.   Therapy Documentation Precautions:  Precautions Precautions: Fall Precaution Booklet Issued: No Precaution Comments: PEG, posterior hip precautions Restrictions Weight Bearing Restrictions: Yes RUE Weight Bearing: Weight bearing as tolerated LUE Weight Bearing: Non weight bearing RLE Weight Bearing: Weight bearing as tolerated LLE Weight Bearing: Weight bearing as tolerated   Therapy/Group: Individual Therapy  Crissie Reese 05/25/2020, 6:20 AM

## 2020-05-25 NOTE — Progress Notes (Signed)
Physical Therapy Session Note  Patient Details  Name: Jeremy George. MRN: [id number] Date of Birth: 1996-01-20  Today's Date: 05/25/2020 PT Individual Time: 1130-1200 PT Individual Time Calculation (min): 30 min   Short Term Goals: Week 4:  PT Short Term Goal 1 (Week 4): Patient will perform all transfers with min A consistently. PT Short Term Goal 2 (Week 4): Paitent will ambulate 50 ft using LRAD with min A consistently. PT Short Term Goal 3 (Week 4): Patient will improved knee extension ROM to <10 deg lacking form neutral.  Skilled Therapeutic Interventions/Progress Updates:     Patient in bed lying on his R side with his mother and father in the room upon PT arrival. Patient alert and agreeable to PT session. Patient reported 7-9/10 L hip pain during session, RN made aware. PT provided repositioning, rest breaks, and distraction as pain interventions throughout session.   Donned L sock with total A and patient donned R sock and L elbow hinge brace with set-up assist. Set brace to 75 deg elbow flexion due to pain with flexion to 90 degrees.  Initiated pain neuroscience reeducation during session. Educated on pain exacerbation and nervous system response to pain following trauma. Patient appreciative of education and motivated to progress with mobility and work on nonpharmacologic strategies to reduce pain.   Therapeutic Activity: Bed Mobility: Patient performed supine to/from sit with supervision in a flat bed without use of bed rails. Transfers: Patient performed stand pivot bed>w/c with CGA to the L. Provided cues for balance strategies when pivoting to the L to reduced L lower extremity weight bearing to tolerance. He performed sit to/from stand x2 with CGA using R loftstrand crutch. Provided verbal cues for holding loftstrand in L hand while standing, without weight bearing, to allow R hand to push up to standing, then switching to R hand with crutch in standing.  Gait  Training:  Patient ambulated 25 feet and 80 feet using R Loftstrand crutch with min A for balance and weight shifting. Ambulated with step-to with intermittent step-through gait pattern with antalgic gait on L, increased hip and knee flexion L>R, and downward head gaze throughout. Provided verbal cues for erect posture, sequencing, and placement of crutch to off-weight his L leg in stance.  Patient in bed with his mother at bedside at end of session with breaks locked, bed alarm set, and all needs within reach.    Therapy Documentation Precautions:  Precautions Precautions: Fall Precaution Booklet Issued: No Precaution Comments: PEG, posterior hip precautions Restrictions Weight Bearing Restrictions: Yes RUE Weight Bearing: Weight bearing as tolerated LUE Weight Bearing: Non weight bearing RLE Weight Bearing: Weight bearing as tolerated LLE Weight Bearing: Weight bearing as tolerated   Therapy/Group: Individual Therapy  Beauty Pless L Permelia Bamba PT, DPT  05/25/2020, 4:37 PM

## 2020-05-25 NOTE — Progress Notes (Signed)
Patient ID: Jeremy George., male   DOB: 1995-05-29, 25 y.o.   MRN: [id number]  SW returned phone call/left message for [name] [phone] ext.[phone]; email:[email or web address])  Who reported he has not received email; SW requested fax number. SW emailed application again (3/24 and 3/31; 4/4).   Cecile Sheerer, MSW, LCSWA Office: [phone] Cell: [phone] Fax: [phone]

## 2020-05-26 NOTE — Plan of Care (Signed)
Goals upgraded 4/5 d/t pt progress  Problem: RH Balance Goal: LTG: Patient will maintain dynamic sitting balance (OT) Description: LTG:  Patient will maintain dynamic sitting balance with assistance during activities of daily living (OT) Flowsheets (Taken 05/26/2020 1016) LTG: Pt will maintain dynamic sitting balance during ADLs with: (upgraded 4/5- SD) Supervision/Verbal cueing   Problem: Sit to Stand Goal: LTG:  Patient will perform sit to stand in prep for activites of daily living with assistance level (OT) Description: LTG:  Patient will perform sit to stand in prep for activites of daily living with assistance level (OT) Flowsheets (Taken 05/26/2020 1016) LTG: PT will perform sit to stand in prep for activites of daily living with assistance level: (upgraded 4/5- SD) Supervision/Verbal cueing   Problem: RH Dressing Goal: LTG Patient will perform upper body dressing (OT) Description: LTG Patient will perform upper body dressing with assist, with/without cues (OT). Flowsheets (Taken 05/26/2020 1016) LTG: Pt will perform upper body dressing with assistance level of: (upgraded 4/5- SD) Supervision/Verbal cueing   Problem: RH Toileting Goal: LTG Patient will perform toileting task (3/3 steps) with assistance level (OT) Description: LTG: Patient will perform toileting task (3/3 steps) with assistance level (OT)  Flowsheets (Taken 05/26/2020 1016) LTG: Pt will perform toileting task (3/3 steps) with assistance level: (upgraded 4/5- SD) Supervision/Verbal cueing   Problem: RH Toilet Transfers Goal: LTG Patient will perform toilet transfers w/assist (OT) Description: LTG: Patient will perform toilet transfers with assist, with/without cues using equipment (OT) Flowsheets (Taken 05/26/2020 1016) LTG: Pt will perform toilet transfers with assistance level of: (upgraded 4/5- SD) Contact Guard/Touching assist

## 2020-05-26 NOTE — Progress Notes (Signed)
Patient ID: Jeremy Palms., male   DOB: 10/27/1995, 25 y.o.   MRN: [id number]  SW met with pt and pt mother in room to provide updates from team conference on gains made while here in rehab, d/c date remains 4/12. SW informed on TBI application was received, and waiting on if services will be approved.   Loralee Pacas, MSW, [place] Office: [phone] Cell: [phone] Fax: [phone]

## 2020-05-26 NOTE — Progress Notes (Signed)
Occupational Therapy Weekly Progress Note  Patient Details  Name: Jeremy George. MRN: [id number] Date of Birth: 05/03/1995  Beginning of progress report period: May 19, 2020 End of progress report period: May 26, 2020  Today's Date: 05/26/2020 OT Individual Time: 4174-0814 OT Individual Time Calculation (min): 56 min    Patient has met 4 of 4 short term goals.  Pt continues to make excellent progress toward OT goals. He is able to complete UB ADLs with CGA using a LH sponge and LB dressing with min A. He is completing ADL transfers with CGA-min A overall. He remains limited by pain and joint tightness in his L hip and L elbow. He is on track for d/c and family education is being scheduled.   Patient continues to demonstrate the following deficits: muscle weakness and muscle joint tightness, unbalanced muscle activation and decreased coordination, decreased initiation, decreased attention, decreased awareness, decreased problem solving, decreased safety awareness, decreased memory and delayed processing and decreased standing balance, decreased postural control, decreased balance strategies and difficulty maintaining precautions and therefore will continue to benefit from skilled OT intervention to enhance overall performance with BADL and iADL.  Patient progressing toward long term goals..  Plan of care revisions: Several goals upgraded to reflect progress.  OT Short Term Goals Week 4:  OT Short Term Goal 1 (Week 4): Pt will complete transfer to [facility] with CGA OT Short Term Goal 1 - Progress (Week 4): Met OT Short Term Goal 2 (Week 4): Pt will don shirt with min A OT Short Term Goal 2 - Progress (Week 4): Met OT Short Term Goal 3 (Week 4): Pt will demonstrate emergent awareness with mod cueing OT Short Term Goal 3 - Progress (Week 4): Met OT Short Term Goal 4 (Week 4): Pt will bathe at shower level with min A overall OT Short Term Goal 4 - Progress (Week 4): Met Week 5:  OT Short  Term Goal 1 (Week 5): STG= LTG d/t ELOS  Skilled Therapeutic Interventions/Progress Updates:    Pt received sitting in w/c agreeable to OT session. JAS splint now present in room. Attempted to fit pt for splint but he initially had low tolerance to pain and stretch. Pt completed w/c propulsion to the therapy gym, 100 ft with min A. Pt completed stand pivot transfer to the mat with CGA. Pt completed familiar basketball flexion/extension holds in closed chain to warm up L elbow. JAS splint was re-applied with much more tolerance. Gentle massage performed to his L elbow within pain tolerance, joint very warm to the touch. Pt completed sit > stand from EOM, he required cueing for upright posture and was able to extend trunk but still unable to extend L hip or knee. Stand pivot with L loftstrand crutch with CGA to the w/c. He was returned to his room and left sitting EOB with mother present.   Before session: 56 degrees flexion.  In JAS splint: 80 degrees flexion, held for 15 min.  In JAS splint 35 degrees extension, held for 10 min.    Therapy Documentation Precautions:  Precautions Precautions: Fall Precaution Booklet Issued: No Precaution Comments: PEG, posterior hip precautions Restrictions Weight Bearing Restrictions: Yes RUE Weight Bearing: Weight bearing as tolerated LUE Weight Bearing: Non weight bearing RLE Weight Bearing: Weight bearing as tolerated LLE Weight Bearing: Weight bearing as tolerated   Therapy/Group: Individual Therapy  Curtis Sites 05/26/2020, 6:27 AM

## 2020-05-26 NOTE — Patient Care Conference (Signed)
Inpatient RehabilitationTeam Conference and Plan of Care Update Date: 05/26/2020   Time: 10:14 AM    Patient Name: Jeremy George.      Medical Record Number: [id number]  Date of Birth: Jan 19, 1996 Sex: Male         Room/Bed: 4W16C/4W16C-01 Payor Info: Payor: MEDICAID POTENTIAL / Plan: MEDICAID POTENTIAL / Product Type: *No Product type* /    Admit Date/Time:  04/27/2020  4:59 PM  Primary Diagnosis:  TBI (traumatic brain injury) [facility])  Hospital Problems: Principal Problem:   TBI (traumatic brain injury) ([place]) Active Problems:   Protein-calorie malnutrition, severe   Sinus tachycardia   Postoperative pain   Thrombocytosis   S/P percutaneous endoscopic gastrostomy (PEG) tube placement [facility])   Dysphagia   Trauma   Decreased ROM of elbow    Expected Discharge Date: Expected Discharge Date: 06/02/20  Team Members Present: Physician leading conference: Dr. Alger Simons Care Coodinator Present: Loralee Pacas, LCSWA;Andrewjames Weirauch Creig Hines, RN, BSN, CRRN Nurse Present: Dorthula Nettles, RN PT Present: Apolinar Junes, PT OT Present: Laverle Hobby, OT SLP Present: Weston Anna, SLP PPS Coordinator present : Gunnar Fusi, SLP     Current Status/Progress Goal Weekly Team Focus  Bowel/Bladder   Pt continent x2.  Pt will remain continent x2.  Assess q shift and prn.   Swallow/Nutrition/ Hydration   Dys. 3 textures with thin liquids, Supervision  Mod I  trials of regular textures   ADL's   Still improving nicely- min-CGA for transfers, min A LB ADLs, CGA UB ADLs, CGA toileting  min A- CGA overall- some upgraded  ADL retraining, d/c planning, family edu, cognitive retraining, ADL transfers   Mobility   Min A overall, gait 80 ft with R Lofstrand crutch  CGA overall, upgraded due to patient progress and L LE weight bearing clearance.  Strength/ROM, pain/activity tolerance, functional mobility, gait training, safety awareness, balance, attention, participation, patient/caregiver  education.   Communication   Supervision  Supervision  Goal Met   Safety/Cognition/ Behavioral Observations  Min A  Min A  problem solving, awareness, recall   Pain   Pt reports pain well controlled.  Pain will remain <3.  Assess q shift and prn.   Skin   Scattered abrasions and surgical incisions.  Skin will continue to heal and remain free of infection.  Assess q shift and prn.     Discharge Planning:  Pt is Uninsured.Pt to discharge to home with his mother who will provide 24/7 care. Assistance from girlfriend as well. Fam edu 4/8 10am-12pm/1pm-3pm with pt mother; 4/10 10am-12pm/1pm-3pm with pt mother and father.   Team Discussion: MD continues to wean sedating medications, tapering Seroquel. Patient is continent B/B, no pain reported this morning, left hip pain at times, pain medication given appropriately, no sleep issues reported. Education on safety precautions, medication management, and skin/wound care. Waiting to hear from the TBI program in [place]. Patient on target to meet rehab goals: Yes, min assist to contact guard for transfers, contact guard for toileting, may upgrade to supervision. PT has been working on functional stretching of left hip and elbow, doing active ROM on the left elbow. He will need a 16x18 wheelchair and he can propel it > 150 ft. He is currently on a Dys 3, thin liquid diet, speech has improved and cognition has improved.  *See Care Plan and progress notes for long and short-term goals.   Revisions to Treatment Plan:  Not at this time.  Teaching Needs: Family education, medication management, pain  management, skin/wound care, safety precautions, transfer training, gait training, wheelchair mobility training, balance training, endurance training, weight bearing precautions.  Current Barriers to Discharge: Decreased caregiver support, Medical stability, Home enviroment access/layout, Wound care, Lack of/limited family support, Weight bearing  restrictions, Medication compliance, Behavior and Nutritional means  Possible Resolutions to Barriers: Continue current medications, offer nutritional support, provide emotional support.     Medical Summary Current Status: improving pain control. left hip tight. continue to taper seroquel. behavior stable, more alert/aware  Barriers to Discharge: Medical stability;Behavior   Possible Resolutions to [employer]: continue modulation of meds, tapering seroquel, assessment of labs/vs   Continued Need for Acute Rehabilitation Level of Care: The patient requires daily medical management by a physician with specialized training in physical medicine and rehabilitation for the following reasons: Direction of a multidisciplinary physical rehabilitation program to maximize functional independence : Yes Medical management of patient stability for increased activity during participation in an intensive rehabilitation regime.: Yes Analysis of laboratory values and/or radiology reports with any subsequent need for medication adjustment and/or medical intervention. : Yes   I attest that I was present, lead the team conference, and concur with the assessment and plan of the team.   Cristi Loron 05/26/2020, 2:09 PM

## 2020-05-26 NOTE — Progress Notes (Signed)
Speech Language Pathology Daily Session Note  Patient Details  Name: Jeremy George. MRN: [id number] Date of Birth: Jan 26, 1996  Today's Date: 05/26/2020 SLP Individual Time: 1300-1355 SLP Individual Time Calculation (min): 55 min  Short Term Goals: Week 4: SLP Short Term Goal 1 (Week 4): Patient will demonstrate functional problem solving for mildly complex and familiar tasks with Min A verbal cues. SLP Short Term Goal 2 (Week 4): Patient will recall 3 event/piece of relevant infromation daily with Mod A verbal cues. SLP Short Term Goal 3 (Week 4): Patient will demonstrate selective attention to functional tasks for 30 minutes with Min verbal cues for redirection. SLP Short Term Goal 4 (Week 4): Patient will self-monitor and correct errors during functional tasks with Mod A verbal cues. SLP Short Term Goal 5 (Week 4): Patient will consume current diet with minimal overt s/s of aspiration and Min verbal cues for use of swallowing compensatory strategies. SLP Short Term Goal 6 (Week 4): Patient will utilize speech intelligibility strategies at the sentence level to achieve ~90% intelligibility with supervision verbal cues.  Skilled Therapeutic Interventions: Skilled treatment session focused on dysphagia and cognitive goals. SLP facilitated session by providing skilled observation with trials of regular textures. Patient demonstrated efficient mastication with complete oral clearance, therefore, recommend patient upgrade to regular textures. SLP also facilitated session by providing extra time for complex problem solving and recall for a previously learned task. Patient also demonstrated increased emergent awareness regarding ongoing deficits and reported needing to work on behavior management and mental fatigue. Patient handed off to OT. Continue with current plan of care.      Pain No/Denies Pain   Agitated Behavior Scale: TBI Observation Details Observation Environment: SLP  office Start of observation period - Date: 05/26/20 Start of observation period - Time: 1300 End of observation period - Date: 05/26/20 End of observation period - Time: 1355 Agitated Behavior Scale (DO NOT LEAVE BLANKS) Short attention span, easy distractibility, inability to concentrate: Absent Impulsive, impatient, low tolerance for pain or frustration: Absent Uncooperative, resistant to care, demanding: Absent Violent and/or threatening violence toward people or property: Absent Explosive and/or unpredictable anger: Absent Rocking, rubbing, moaning, or other self-stimulating behavior: Absent Pulling at tubes, restraints, etc.: Absent Wandering from treatment areas: Absent Restlessness, pacing, excessive movement: Absent Repetitive behaviors, motor, and/or verbal: Absent Rapid, loud, or excessive talking: Absent Sudden changes of mood: Absent Easily initiated or excessive crying and/or laughter: Absent Self-abusiveness, physical and/or verbal: Absent Agitated behavior scale total score: 14  Therapy/Group: Individual Therapy  Khiya Friese 05/26/2020, 3:20 PM

## 2020-05-26 NOTE — Plan of Care (Signed)
  Problem: Sit to Stand Goal: LTG:  Patient will perform sit to stand with assistance level (PT) Description: LTG:  Patient will perform sit to stand with assistance level (PT) Flowsheets (Taken 05/26/2020 0554) LTG: PT will perform sit to stand in preparation for functional mobility with assistance level: (upgraded goal due to patient's progress with mobility with L LE weight bearing clearance.) Contact Guard/Touching assist Note: upgraded goal due to patient's progress with mobility with L LE weight bearing clearance.   Problem: RH Bed Mobility Goal: LTG Patient will perform bed mobility with assist (PT) Description: LTG: Patient will perform bed mobility with assistance, with/without cues (PT). Flowsheets (Taken 05/26/2020 0554) LTG: Pt will perform bed mobility with assistance level of: (upgraded goal due to patient's progress with mobility.) Independent Note: upgraded goal due to patient's progress with mobility.   Problem: RH Bed to Chair Transfers Goal: LTG Patient will perform bed/chair transfers w/assist (PT) Description: LTG: Patient will perform bed to chair transfers with assistance (PT). Flowsheets (Taken 05/26/2020 0554) LTG: Pt will perform Bed to Chair Transfers with assistance level: (upgraded goal due to patient's progress with mobility with L LE weight bearing clearance.) Contact Guard/Touching assist Note: upgraded goal due to patient's progress with mobility with L LE weight bearing clearance.

## 2020-05-26 NOTE — Progress Notes (Signed)
Physical Therapy TBI Note  Patient Details  Name: Jeremy George. MRN: [id number] Date of Birth: 01/04/1996  Today's Date: 05/26/2020 PT Individual Time: 0800-0900 PT Individual Time Calculation (min): 60 min   Short Term Goals: Week 4:  PT Short Term Goal 1 (Week 4): Patient will perform all transfers with min A consistently. PT Short Term Goal 2 (Week 4): Paitent will ambulate 50 ft using LRAD with min A consistently. PT Short Term Goal 3 (Week 4): Patient will improved knee extension ROM to <10 deg lacking form neutral.  Skilled Therapeutic Interventions/Progress Updates:     Patient in bed with his mother in the room upon PT arrival. Patient alert and agreeable to PT session. Patient reported 6-8/10 L hip and elbow pain during session, RN made aware. PT provided repositioning, rest breaks, and distraction as pain interventions throughout session.   Therapeutic Activity: Bed Mobility: Patient performed supine to long sit independently to don R non-skid sock independently, then transitioned to sitting EOB independently to don L sock using sock-aid with mod I.   Transfers: Patient performed stand pivot bed>w/c and w/c>mat table with CGA without AD. He performed sit to/from stand x9 with CGA with and without L Loftstrand crutch. Provided verbal cues for forward weight shift, pushing up to stand, increased hip/knee/trunk extension to come to full standing and promote L hip extension. Patient with difficulty tolerating full L knee/hip extension in standing throughout session.   Gait Training:  Patient ambulated 40 feet with a 90 degree turn and >100 feet with one brief sitting rest break due to sudden L hip pain/spasm leading to patient requesting to sit to let the spasm resolve. He ambulated using R Lofstrand crutch with CGA-min A. Ambulated with crouched gait with increased L hip/knee flexion compared to R, antalgic gait on L, and step-through progressing to step-to gait pattern with  increased pain/fatigue. Provided verbal cues for erect posture, increased hip/knee extension, reduced R shoulder elevation and increased scapular muscle activation to protect his shoulder with use of crutch, looking ahead, and diaphragmatic breathing for pain management. Attempted trial with standard crutch on R, but unable to locate a crutch to fit the patient's height on the unit at this time. Patient's mother reports that he has crutches at home and will bring one in for him to trial.   Wheelchair Mobility:  Patient propelled wheelchair >100 feet with supervision using R hemi-technique to maintain NWB through L upper extremity. Provided verbal cues for steering and turning technique.  Neuromuscular Re-ed: Patient performed the following activities for improved standing balance and L elbow and hip ROM: -standing dunking a mesh ball into a basketball hoop using B upper extremities and focused on increased L hip extension and weight bearing in standing 5 trials progressing from 3 to 6 reps per trial -seated B elbow flexion extension holding mesh ball curling the ball to his chest then extending the ball out away from his chest 3x5, encouraged patient to do this 2-3x per day for improved elbow ROM and motor control  Patient's mother reports that OT cleared her for showering patient, noted safety plan updated to reflect this. Educated on only using w/c for transport to/from the bathroom at this time for safety, patient and his mother stated understanding. Patient's mother transferred patient to the w/c with CGA and safe guarding technique in preparation for showering at end of session, RN made aware.    Therapy Documentation Precautions:  Precautions Precautions: Fall Precaution Booklet Issued: No Precaution Comments:  PEG, posterior hip precautions Restrictions Weight Bearing Restrictions: Yes RUE Weight Bearing: Weight bearing as tolerated LUE Weight Bearing: Non weight bearing RLE Weight  Bearing: Weight bearing as tolerated LLE Weight Bearing: Weight bearing as tolerated Agitated Behavior Scale: TBI Observation Details Observation Environment: [facility] Start of observation period - Date: 05/26/20 Start of observation period - Time: 0800 End of observation period - Date: 05/26/20 End of observation period - Time: 0900 Agitated Behavior Scale (DO NOT LEAVE BLANKS) Short attention span, easy distractibility, inability to concentrate: Absent Impulsive, impatient, low tolerance for pain or frustration: Mild Uncooperative, resistant to care, demanding: Absent Violent and/or threatening violence toward people or property: Absent Explosive and/or unpredictable anger: Absent Rocking, rubbing, moaning, or other self-stimulating behavior: Absent Pulling at tubes, restraints, etc.: Absent Wandering from treatment areas: Absent Restlessness, pacing, excessive movement: Absent Repetitive behaviors, motor, and/or verbal: Absent Rapid, loud, or excessive talking: Absent Sudden changes of mood: Absent Easily initiated or excessive crying and/or laughter: Absent Self-abusiveness, physical and/or verbal: Absent Agitated behavior scale total score: 15   Therapy/Group: Individual Therapy  Kerwin Augustus L Harwood Nall PT, DPT  05/26/2020, 4:23 PM

## 2020-05-27 NOTE — Progress Notes (Signed)
Physical Therapy Weekly Progress Note  Patient Details  Name: Jeremy George. MRN: [id number] Date of Birth: 04/08/1995  Beginning of progress report period: May 20, 2020 End of progress report period: May 27, 2020  Today's Date: 05/27/2020     Patient has met 3 of 3 short term goals. He continues to make steady progress with therapies with intermittent limitations from L hip pain and hip flexor contracture versus muscle guarding leading to crouched posture in standing and with gait. He currently requires CGA-min A with all mobility, ambulating up to 80 ft with crutch, and able to propel a manual w/c >150 ft with supervision using R hemi-technique.   Patient continues to demonstrate the following deficits muscle weakness and muscle joint tightness, decreased cardiorespiratoy endurance, decreased attention, decreased problem solving and decreased safety awareness and decreased standing balance, decreased postural control and decreased balance strategies and therefore will continue to benefit from skilled PT intervention to increase functional independence with mobility.  Patient progressing toward long term goals..  Continue plan of care.  PT Short Term Goals Week 4:  PT Short Term Goal 1 (Week 4): Patient will perform all transfers with min A consistently. PT Short Term Goal 1 - Progress (Week 4): Met PT Short Term Goal 2 (Week 4): Paitent will ambulate 50 ft using LRAD with min A consistently. PT Short Term Goal 2 - Progress (Week 4): Met PT Short Term Goal 3 (Week 4): Patient will improved knee extension ROM to <10 deg lacking form neutral. (knee extension to neutral this week, however, reduced hip flexor extension with muscle guarding and intermittent spasms) PT Short Term Goal 3 - Progress (Week 4): Met Week 5:  PT Short Term Goal 1 (Week 5): STG=LTG due to ELOS.  Therapy Documentation Precautions:  Precautions Precautions: Fall Precaution Booklet Issued: No Precaution  Comments: PEG, posterior hip precautions Restrictions Weight Bearing Restrictions: Yes RUE Weight Bearing: Weight bearing as tolerated LUE Weight Bearing: Non weight bearing RLE Weight Bearing: Weight bearing as tolerated LLE Weight Bearing: Weight bearing as tolerated  Therapy/Group: Individual Therapy  Vickey Ewbank L Kania Regnier PT, DPT  05/27/2020, 6:20 AM

## 2020-05-27 NOTE — Progress Notes (Signed)
Physical Therapy Session Note  Patient Details  Name: Jeremy George. MRN: [id number] Date of Birth: 06-May-1995  Today's Date: 05/27/2020 PT Individual Time: 0935-1000 PT Individual Time Calculation (min): 25 min   Short Term Goals: Week 4:  PT Short Term Goal 1 (Week 5): STG=LTG due to ELOS.  Skilled Therapeutic Interventions/Progress Updates:    Patient in w/c in room with mother present.  Reports overworked already with his L arm in spasm and L hip.  Agreeable to w/c mobility with R UE and LE.  Initiated propelling with hemi technique in hallway, but noted difficulty due to sock slipping on floor so removed and applied his shoe with max A for time management.  Patient propelled x 150' with S over level tile and carpeted surfaces in open controlled environment and in small space of ADL apartment with cues for managing corners as pt using walls and railing at times for propulsion.  Patient transferred to recliner in ADL apartment with min A HHA and cues.  Managing in kitchen to access pantry and retrieve and replace one item from w/c level.  Patient propelled to room and eager to shower.  Assisted up lip over edge of bathroom door and discussed possibly easier to back in.  Set up for safe transfer to shower bench, pt doffing clothes with close S with sit to stand and pivot to bench with CGA.  Left in shower with mother taking over.  Therapy Documentation Precautions:  Precautions Precautions: Fall Precaution Booklet Issued: No Precaution Comments: PEG, posterior hip precautions Restrictions Weight Bearing Restrictions: Yes RUE Weight Bearing: Weight bearing as tolerated LUE Weight Bearing: Non weight bearing RLE Weight Bearing: Weight bearing as tolerated LLE Weight Bearing: Weight bearing as tolerated Pain: Pain Assessment Pain Scale: Faces Faces Pain Scale: Hurts whole lot Pain Type: Acute pain Pain Location: Elbow Pain Orientation: Left Pain Descriptors / Indicators:  Cramping;Spasm Pain Onset: With Activity Pain Intervention(s): Repositioned;Emotional support   Therapy/Group: Individual Therapy  Elray Mcgregor  Sheran Lawless, PT 05/27/2020, 4:32 PM

## 2020-05-27 NOTE — Progress Notes (Signed)
9 Days Post-Op  Subjective: 25 year old patient status post open treatment of multiple facial fractures with MMF fixation and closed reduction of comminuted displaced bilateral nasal bones with Dr. Arita Miss on 04/06/2020.  He also underwent repair of multiple facial lacerations by Dr. Ulice Bold on 03/29/2020.  He had his MMF hardware removed on 05/18/2020 with Dr. Arita Miss.  Patient is alert and sitting up at the bedside today with his mother at the bedside. He reports he is doing well.  He is eating normally.  He is not complaining of any oral pain.  He has no complaints.  Objective: Vital signs in last 24 hours: Temp:  [98.2 F (36.8 C)-98.5 F (36.9 C)] 98.2 F (36.8 C) (04/06 0446) Pulse Rate:  [75-88] 88 (04/06 0446) Resp:  [18-19] 18 (04/06 0446) BP: (103-115)/(63-71) 103/71 (04/06 0446) SpO2:  [98 %-100 %] 98 % (04/06 0446) Last BM Date: 05/25/20  Intake/Output from previous day: 04/05 0701 - 04/06 0700 In: 240 [P.O.:240] Out: 1500 [Urine:1500] Intake/Output this shift: Total I/O In: 480 [P.O.:480] Out: -   General appearance: alert, cooperative, no distress and Sitting at bedside, mother present Head: Normocephalic, without obvious abnormality, laceration of left cheek, bilateral lower eyelid incisions and lip incisions are healing well.  They are thickened. Eyes: conjunctivae/corneas clear. PERRL, EOM's intact. Fundi benign. Nose: Nares normal, nares patent, nose is crooked Extremities: extremities normal, atraumatic, no cyanosis or edema   Lab Results:  CBC Latest Ref Rng & Units 05/25/2020 05/18/2020 05/11/2020  WBC 4.0 - 10.5 K/uL 6.8 9.8 6.9  Hemoglobin 13.0 - 17.0 g/dL 2.8(M) 0.3(K) 10.5(L)  Hematocrit 39.0 - 52.0 % 29.2(L) 26.1(L) 32.3(L)  Platelets 150 - 400 K/uL 415(H) 386 433(H)    BMET Recent Labs    05/25/20 0757  NA 136  K 3.9  CL 101  CO2 22  GLUCOSE 93  BUN 6  CREATININE 0.50*  CALCIUM 8.9   PT/INR No results for input(s): LABPROT, INR in the last 72  hours. ABG No results for input(s): PHART, HCO3 in the last 72 hours.  Invalid input(s): PCO2, PO2  Studies/Results: No results found.  Anti-infectives: Anti-infectives (From admission, onward)   Start     Dose/Rate Route Frequency Ordered Stop   05/18/20 1545  ceFAZolin (ANCEF) IVPB 2g/100 mL premix        2 g 200 mL/hr over 30 Minutes Intravenous On call to O.R. 05/18/20 1525 05/18/20 1735   05/18/20 1526  ceFAZolin (ANCEF) 2-4 GM/100ML-% IVPB       Note to Pharmacy: Sabino Niemann   : cabinet override      05/18/20 1526 05/18/20 1712   05/15/20 2359  ceFAZolin (ANCEF) IVPB 2g/100 mL premix        2 g 200 mL/hr over 30 Minutes Intravenous Every 8 hours 05/15/20 2323 05/17/20 0054   05/15/20 0958  vancomycin (VANCOCIN) powder  Status:  Discontinued          As needed 05/15/20 0958 05/15/20 1057      Assessment/Plan: s/p Procedure(s): MANDIBULAR HARDWARE REMOVAL  Patient is doing well, facial lacerations are healing well.  No intraoral complications.  We discussed use of scar creams and sunscreen for his scars on his face.  We also discussed that he has no restrictions at this time in regards to eating.  I discussed with him and his mother that we could schedule a follow-up in our office in 4 weeks for reevaluation or follow-up on an as-needed basis.  They are interested in 1  additional follow-up at our office to discuss incision care and for further evaluation.  Recommend calling with questions or concerns. Recommend following up in our office in 4 weeks.   LOS: 30 days    Leslee Home, PA-C 05/27/2020

## 2020-05-27 NOTE — Progress Notes (Signed)
Orthopaedic Trauma Progress Note  SUBJECTIVE: Patient doing well today.  Has received the JAS brace for left elbow and then using this to work on improving elbow motion but notes that this has caused increased muscle spasms in the arm.  The pain he was having in his knee last week when ambulating has now improved.  Patient eager to be discharged next week.  OBJECTIVE:  Vitals:   05/26/20 1942 05/27/20 0446  BP: 114/70 103/71  Pulse: 75 88  Resp: 19 18  Temp: 98.5 F (36.9 C) 98.2 F (36.8 C)  SpO2: 100% 98%    General: Sitting up in bed. No acute distress Respiratory: No increased work of breathing.  LUE: Incision clean, dry, intact.  Sutures ready to be removed.  About 15 degrees shy of full elbow extension. Flexion to 90 degrees.  Compartments soft and compressible.  Able to wiggle fingers and squeeze my hand. 2+ Radial pulse  LLE: Incision over posterior lateral hip is clean, dry, intact with Dermabond in place.  No significant tenderness with palpation over the hip but does endorse some discomofrt over distal thigh.  Tolerates gentle hip and knee motion.  Ankle dorsiflexion/plantarflexion is intact.  Wiggles toes.  Compartments compressible. + DP pulse  IMAGING: Postop imaging of left elbow stable with graft in place  LABS:  No results found for this or any previous visit (from the past 24 hour(s)).  ASSESSMENT: Aaron D [name]. is a 25 y.o. male, 2 days postop s/p removal of antibiotic cement and repair of left distal humerus nonunion.   Other injuries include: 1. Right clavicle fracture s/p ORIF 04/03/20 2. Left posterior wall acetabular fracture/dislocation s/p ORIF 04/01/20 4. Left Monteggia fracture/dislocation s/p ORIF 04/01/20   PLAN: Weightbearing:  NWB LUE, WBAT LLE, WBAT RUE. ROM: Unrestricted AROM/PROM bilateral upper and lower extremities. Discontinue posterior hip precautions LLE Incisional and dressing care: Incisions may be left open to air.  Plan to remove  sutures tomorrow from left elbow Orthopedic device(s): None needed Pain management: per primary team VTE prophylaxis: Lovenox, SCDs Impediments to Fracture Healing: Polytrauma, significant bone loss. Vit D level 12, continue supplementation  Dispo: Continue therapies per CIR team. Continue aggressive elbow ROM with JAS static progressive splint   Follow - up plan: We will continue to follow patient while in hospital. Plan for outpatient follow-up with Dr. [name] 2 weeks after discharge.   Contact information:  Truitt Merle MD, Ulyses Southward PA-C. After hours and holidays please check [email or web address] for group call information for [facility]   Wilbur Labuda A. Michaelyn Barter, PA-C [phone] (office) [email or web address]

## 2020-05-27 NOTE — Progress Notes (Signed)
Occupational Therapy TBI Note  Patient Details  Name: Jeremy George. MRN: [id number] Date of Birth: [birth year]  Today's Date: 05/27/2020 OT Individual Time: 0815-0900 OT Individual Time Calculation (min): 45 min    Short Term Goals: Week 5:  OT Short Term Goal 1 (Week 5): STG= LTG d/t ELOS  Skilled Therapeutic Interventions/Progress Updates:    Pt received supine with no c/o pain at rest. Pt agreeable to OT session and declining ADLs. Pt completed bed mobility to EOB with supervision. He was able to don R sock and then ask for sock aid to don L. Stand pivot transfer to the w/c with CGA. He was brought to the therapy gym via w/c. Remainder of session focused on joint tightness/contracture in L hip, L knee, and L elbow. Gentle heat and PROM provided to the L hip and elbow as preparatory method and to increase pain tolerance/relaxation. Pt was able to transition into modified prone with L elbow off the side of the mat and pt guarding L hip extension. Pt with improved pain tolerance this session and able to tolerate gentle PROM with resistance applied to end ranges. A 2lb wrist weight was applied to his L wrist to provide gravity assisted elbow extension stretch while seated. He was able to tolerate L elbow to slightly below 90 degrees. Pt required frequent rest breaks d/t pain. [name] arrived with crutch and he trialed this with 10 ft of functional mobility. The crutch was successful in promoting more upright posture however pt stating he still prefers loftstrand. Deferred further discussion re AD to PT. Pt returned to his room and was instructed to don JAS brace with mother's assist after SLP morning session. Pt agreeable and left sitting up with all needs met.   Therapy Documentation Precautions:  Precautions Precautions: Fall Precaution Booklet Issued: No Precaution Comments: PEG, posterior hip precautions Restrictions Weight Bearing Restrictions: Yes RUE Weight Bearing: Weight  bearing as tolerated LUE Weight Bearing: Non weight bearing RLE Weight Bearing: Weight bearing as tolerated LLE Weight Bearing: Weight bearing as tolerated Agitated Behavior Scale: TBI Observation Details Observation Environment: CIR Start of observation period - Date: 05/27/20 Start of observation period - Time: 0830 End of observation period - Date: 05/27/20 End of observation period - Time: 0915 Agitated Behavior Scale (DO NOT LEAVE BLANKS) Short attention span, easy distractibility, inability to concentrate: Absent Impulsive, impatient, low tolerance for pain or frustration: Absent Uncooperative, resistant to care, demanding: Absent Violent and/or threatening violence toward people or property: Absent Explosive and/or unpredictable anger: Absent Rocking, rubbing, moaning, or other self-stimulating behavior: Absent Pulling at tubes, restraints, etc.: Absent Wandering from treatment areas: Absent Restlessness, pacing, excessive movement: Absent Repetitive behaviors, motor, and/or verbal: Absent Rapid, loud, or excessive talking: Absent Sudden changes of mood: Absent Easily initiated or excessive crying and/or laughter: Absent Self-abusiveness, physical and/or verbal: Absent Agitated behavior scale total score: 14   Therapy/Group: Individual Therapy  Curtis Sites 05/27/2020, 10:46 AM

## 2020-05-27 NOTE — Progress Notes (Signed)
Speech Language Pathology Weekly Progress and Session Note  Patient Details  Name: Jeremy George. MRN: [id number] Date of Birth: 26-Aug-1995  Beginning of progress report period: May 20, 2020 End of progress report period: May 27, 2020  Today's Date: 05/27/2020 SLP Individual Time: 1200-1230 SLP Individual Time Calculation (min): 30 min  Missed Time: 30 minutes, missed initial session from 10-11 as patient was in shower to help reduce pain in left hip  Short Term Goals: Week 4: SLP Short Term Goal 1 (Week 4): Patient will demonstrate functional problem solving for mildly complex and familiar tasks with Min A verbal cues. SLP Short Term Goal 1 - Progress (Week 4): Met SLP Short Term Goal 2 (Week 4): Patient will recall 3 event/piece of relevant infromation daily with Mod A verbal cues. SLP Short Term Goal 2 - Progress (Week 4): Met SLP Short Term Goal 3 (Week 4): Patient will demonstrate selective attention to functional tasks for 30 minutes with Min verbal cues for redirection. SLP Short Term Goal 3 - Progress (Week 4): Met SLP Short Term Goal 4 (Week 4): Patient will self-monitor and correct errors during functional tasks with Mod A verbal cues. SLP Short Term Goal 4 - Progress (Week 4): Met SLP Short Term Goal 5 (Week 4): Patient will consume current diet with minimal overt s/s of aspiration and Min verbal cues for use of swallowing compensatory strategies. SLP Short Term Goal 5 - Progress (Week 4): Met SLP Short Term Goal 6 (Week 4): Patient will utilize speech intelligibility strategies at the sentence level to achieve ~90% intelligibility with supervision verbal cues. SLP Short Term Goal 6 - Progress (Week 4): Met    New Short Term Goals: Week 5: SLP Short Term Goal 1 (Week 5): STGs=LTGs due to ELOS  Weekly Progress Updates: Patient continues to make excellent gains and has met 6 of 6 STGs this reporting period. Currently, patient is consuming regular textures with thin  liquids with minimal overt s/s of aspiration and is overall Mod I for use of swallowing compensatory strategies. Patient is also overall Mod I for use of speech intelligibility strategies and is 100% intelligible at the conversation level. Patient demonstrates behaviors consistent with a Rancho Level VII and requires overall supervision-Min A verbal cues to complete functional and familiar tasks safely in regards to problem solving, attention, recall and awareness. Patient and family education ongoing. Patient would benefit from continued skilled SLP intervention to maximize his cognitive functioning and overall functional independence prior to discharge.      Intensity: Minumum of 1-2 x/day, 30 to 90 minutes Frequency: 3 to 5 out of 7 days Duration/Length of Stay: 06/02/20 Treatment/Interventions: Dysphagia/aspiration precaution training;Therapeutic Activities;Environmental controls;Cueing hierarchy;Functional tasks;Patient/family education;Cognitive remediation/compensation;Speech/Language facilitation;Internal/external aids   Daily Session  Skilled Therapeutic Interventions: Skilled treatment session focused on dysphagia and cognitive goals. Although patient is on a regular diet, patient reported he loves sunflower seeds and varieties of nuts. SLP facilitated session by providing skilled observation of patient consuming handfuls of nuts and seeds with the shell intact. Patient was able to orally expectorate the shells and did not demonstrate any overt s/s of aspiration. Recommend patient be allowed snacks of choice but must be awake and alert as well as sitting EOB. Patient verbalized understanding. Patient appeared to demonstrate increased frustration today, suspect due to decreased mental flexibility. Patient left supine in bed with alarm on and all needs within reach. Continue with current plan of care.     Pain Intermittent pain in left  hip, patient premedicated   Therapy/Group: Individual  Therapy  Danaja Lasota 05/27/2020, 12:37 PM

## 2020-05-27 NOTE — Progress Notes (Addendum)
Physical Therapy TBI Note  Patient Details  Name: Jeremy George. MRN: [id number] Date of Birth: 1995/05/03  Today's Date: 05/27/2020 PT Individual Time: 0350-0938 PT Individual Time Calculation (min): 70 min   Short Term Goals: Week 5:  PT Short Term Goal 1 (Week 5): STG=LTG due to ELOS.  Skilled Therapeutic Interventions/Progress Updates:     Patient sitting EOB upon PT arrival. Patient alert and agreeable to PT session. Patient reported 5-7/10 L hip and L elbow pain during session, RN made aware. PT provided repositioning, rest breaks, and distraction as pain interventions throughout session.   Therapeutic Activity: Bed Mobility: Patient performed supine to/from sit and rolling prone<>supine with supervision on a mat table. Provided verbal cues for safety awareness with L lower extremity and L elbow NWB when going to/from prone. Transfers: Patient performed stand pivot bed<>w/c and w/c<>mat table with close supervision-CGA. Provided cues for reduced stepping and more of a pivot for increased safety and independence with transfers. He performed sit to/from stand x2 with CGA using a standard crutch, holding crutch in L hand without pressure to use his R hand to push up, then transitioning the crutch to his R hand. Provided verbal cues for sequencing x1. Adjusted crutch height and grip to fit patient, may need further adjustment due to patient's crouched posture in standing due to L hip flexor tightness and L hip pain.  Gait Training:  Patient ambulated 50 feet with 90 deg and 180 deg turns using R crutch with min A-CGA. Ambulated with crouched posture with increased hip/knee/trunk flexion and step-to progressing to step-though gait pattern with improved sequencing with use of new crutch. Provided verbal cues for erect posture, encouraged hip knee extension in B limbs and at least in his R leg due to no pain limitations with this, and provided cues for sequencing and placement of crutch to  off-weight L leg and promote step-through gait pattern.  Wheelchair Mobility:  Patient was transported in the w/c with total A to/from [facility] entrance to go outside for energy conservation and time management.  Therapeutic Exercise: Patient performed the following exercises with verbal and tactile cues for proper technique. -supine stretch for L hip flexor 2x30 sec, gentle distraction at patient's ankle to promote hip extension -L quad sets 2x5 with 5 sec hold, poor tolerance, but able to push through pain -prone, with L arm hanging off mat table to maintain NWB, L hip flexor stretch, patient only tolerated hips square to the mat 2x5 sec, educated on increasing this time to 30 sec as tolerated with supervision from therapist or family, patient in agreement -seated L LAQ with full terminal knee extension x10  Patient initially transported outside for quiet meditation for improved emotional well-being. Patient's mother arrived as therapist was taking the patient off the unit and joined therapist and patient outside. Discussed d/c planniing/concerns, behavior management, safety awareness, and 24/7 supervision. Patient stated that he would be going upstairs to sleep in his bed at night, per his mother the plan was for the patient to sleep downstairs in her bed or on an inflated mattress. Patient insisted that he would go up stairs with assistance to sleep in his own bed. Provided education on safety concerns and strategies for navigating stairs with assistance. Patient's mother reports that the patient will have a shower chair at home, showed patient and his mother a video of performing stairs with a shower chair and L rail with 1 person assist. Plan to initiate stair training tomorrow to assess  if this will be safe at d/c.   Patient tolerated sitting outside well. Reported that the fresh air was "different and nice." Patient also reported that he had the urge to have a cigarette and would return to smoking  at d/c. Provided education of harmful effects of cigarette smoking on healing and brain injury. Patient stated, "I said I would give-up drinking, but I can't give up smoking" and "I'll just smoke less than before." Provided education on smoking cessation. Patient will require follow-up on smoking cessation as he is not ready to discuss this at this time.    Patient sitting EOB with his mother in the room at end of session with breaks locked, bed alarm set, and all needs within reach. Donned JAS brace to L arm set for extension stretch. Patient recalled PA stated 15 min in extension and 15 min in flexion. Reviewed set-up for both extension and flexion and placement of brace on patient's thigh for improved positioning.    Therapy Documentation Precautions:  Precautions Precautions: Fall Precaution Booklet Issued: No Precaution Comments: PEG, posterior hip precautions Restrictions Weight Bearing Restrictions: Yes RUE Weight Bearing: Weight bearing as tolerated LUE Weight Bearing: Non weight bearing RLE Weight Bearing: Weight bearing as tolerated LLE Weight Bearing: Weight bearing as tolerated Agitated Behavior Scale: TBI Observation Details Observation Environment: Patient's room, hallway and ADL apartment Start of observation period - Date: 05/27/20 Start of observation period - Time: 1515 End of observation period - Date: 05/27/20 End of observation period - Time: 1625 Agitated Behavior Scale (DO NOT LEAVE BLANKS) Short attention span, easy distractibility, inability to concentrate: Absent Impulsive, impatient, low tolerance for pain or frustration: Present to a mild degree Uncooperative, resistant to care, demanding: Present to a mild degree Violent and/or threatening violence toward people or property: Absent Explosive and/or unpredictable anger: Absent Rocking, rubbing, moaning, or other self-stimulating behavior: Absent Pulling at tubes, restraints, etc.: Absent Wandering from  treatment areas: Absent Restlessness, pacing, excessive movement: Absent Repetitive behaviors, motor, and/or verbal: Absent Rapid, loud, or excessive talking: Absent Sudden changes of mood: Absent Easily initiated or excessive crying and/or laughter: Absent Self-abusiveness, physical and/or verbal: Absent Agitated behavior scale total score: 16   Therapy/Group: Individual Therapy  Equilla Que L Anya Murphey PT, DPT  05/27/2020, 4:57 PM

## 2020-05-27 NOTE — Progress Notes (Signed)
Pt has remained alert and following commands throughout the shift.  Pt has not attempted to get up from the bed. Following safety precautions. Remains without restraints

## 2020-05-28 MED ORDER — QUETIAPINE FUMARATE 50 MG PO TABS
150.0000 mg | ORAL_TABLET | Freq: Every day | ORAL | Status: DC
  Administered 2020-05-28 – 2020-05-31 (×4): 150 mg via ORAL
  Filled 2020-05-28 (×4): qty 3

## 2020-05-28 NOTE — Progress Notes (Signed)
Speech Language Pathology Daily Session Note  Patient Details  Name: Jeremy George. MRN: [id number] Date of Birth: [birth year]  Today's Date: 05/28/2020 SLP Individual Time: 1310-1400 SLP Individual Time Calculation (min): 50 min  Short Term Goals: Week 5: SLP Short Term Goal 1 (Week 5): STGs=LTGs due to ELOS  Skilled Therapeutic Interventions: Skilled SLP intervention focused on cognition. Pt was Mod I with use of speech intelligibility strategies in conversation to explain game rules. He recalled purpose for 50% of medications and required mod A for remaining medications. Functional problem solving task completed with time calculation task. Min A required with verbal cues to increase awareness of errors and attention to details. Pt seen with sunflower seeds as snack at end of session. He tolerated with no overt s/sx of aspiration or penetration. Cont with therapy per plan of care.      Pain Pain Assessment Pain Scale: Faces Faces Pain Scale: Hurts little more Pain Location: Hip  Therapy/Group: Individual Therapy  Jeremy George Jeremy George 05/28/2020, 1:52 PM

## 2020-05-28 NOTE — Progress Notes (Signed)
Physical Therapy Session Note  Patient Details  Name: Jeremy George. MRN: [id number] Date of Birth: 30-Aug-1995  Today's Date: 05/28/2020 PT Individual Time: 0830-0900 PT Individual Time Calculation (min): 30 min   Short Term Goals: Week 4:  PT Short Term Goal 1 (Week 5): STG=LTG due to ELOS.  Skilled Therapeutic Interventions/Progress Updates:    Patient seated EOB and reports feeling okay.  Discussed elbow splints and reports brace for at night, but he donned anyway with min A and adjusted for flexion.  Patient transferred to w/c with CGA and propelled with hemitechnique to gym.  Patient sit to stand with S and ambulated with axillary crutch x 80' min A to CGA for L hip encouraging extension.  Patient sit to supine on mat with S to perform sidelying clamshell hip abduction x 10, hip extension with A AAROM x 10, supine for contract relax x 3 reps with 20-30 sec hold into hip extension, bridging x 10 with 5 sec hold.  Patient standing at hi-lo table to perform hip extension mod cues for not putting weight in L hand.  Propelled to room min w/c with S to min A to prevent veering L.  Left in w/c with mother in the room and needs in reach.  Therapy Documentation Precautions:  Precautions Precautions: Fall Precaution Booklet Issued: No Precaution Comments: PEG, posterior hip precautions Restrictions Weight Bearing Restrictions: Yes RUE Weight Bearing: Weight bearing as tolerated LUE Weight Bearing: Non weight bearing RLE Weight Bearing: Weight bearing as tolerated LLE Weight Bearing: Weight bearing as tolerated Pain: Pain Assessment Pain Scale: 0-10 Pain Score: 7  Pain Type: Acute pain Pain Location: Hip Pain Orientation: Left Pain Descriptors / Indicators: Aching;Tender;Spasm;Throbbing Pain Frequency: Constant Pain Onset: On-going Pain Intervention(s): Medication (See eMAR)   Therapy/Group: Individual Therapy  Elray Mcgregor  Sheran Lawless, PT 05/28/2020, 12:18 PM

## 2020-05-28 NOTE — Progress Notes (Signed)
Physical Therapy TBI Note  Patient Details  Name: Jeremy George. MRN: [id number] Date of Birth: 1995/06/12  Today's Date: 05/28/2020 PT Individual Time: 8657-8469 PT Individual Time Calculation (min): 26 min   Short Term Goals: Week 5:  PT Short Term Goal 1 (Week 5): STG=LTG due to ELOS.  Skilled Therapeutic Interventions/Progress Updates:    Pt received supine in bed with his mother present and pt agreeable to therapy session. Supine>sitting R EOB with close supervision for safety due to hip proximity to EOB. Sit<>stands using axillary crutch with CGA throughout session - continues to demonstrate limited/no L LE WBing during transfers often tucking this foot backwards up underneath him - occasionally will hold crutch with L hand so that he can use R hand to push up from seat and is able to safely do this while maintaining L UE NWBing precaution. Short distance ~60ft ambulatory transfer to w/c using R axillary crutch with CGA - continues to demonstrate trunk flexed posture with sustained L hip/knee flexion throughout gait. Transported to/from gym in w/c for time management and energy conservation.   Gait training ~174ft using R axillary crutch with CGA progressing towards min assist at end due to fatigue and increased pain - continues with above impairments, therapist providing tactile and verbal cuing for improved L LE hip/knee extension with attempts at manual facilitation for improvement but unsuccessful due to decreased ROM and increased pain, occasional slight L knee giving way during stance but pt able to maintain balance without increased assist - performs reciprocal stepping pattern throughout.  Sitting EOM focusing on increased L LE ROM performing seated hamstring/gastroc stretch with L LE extended 5reps of 5 sec hold quad sets for terminal knee extension 2 sets. Pt reports a significant "burning" pain in his L calf muscle and shooting pain in L lateral hip during this exercise  therefore cued to perform within ROM that didn't exacerbate symptoms.   Sitting EOM L UE AROM and AAROM: - active scapular retractions with depression due to to excessive protraction and anterior scapular tilt - 10x 5 sec hold with therapist facilitating proper scapular movement - active assisted L shoulder extension x10 reps targeting proximal bicep stretching due to limited elbow extension ROM - active L UE overhead reach in scapular plan x10 reps (mimicking jumping jack movement)  Stand pivot EOM>w/c using R axillary crutch CGA and transported back to room. Pt initially agreeable to sit up in w/c but once positioned him next to EOB pt requested for assist back to bed. L stand pivot w/c>EOB using R axillary crutch CGA. Pt requesting to sit on EOB - RN approved for pt to remain in this position with pt's mother present and no need for bed alarm on, pt's mother in agreement with this plan - pt left with needs in reach.    Therapy Documentation Precautions:  Precautions Precautions: Fall Precaution Booklet Issued: No Precaution Comments: PEG, posterior hip precautions Restrictions Weight Bearing Restrictions: Yes RUE Weight Bearing: Weight bearing as tolerated LUE Weight Bearing: Non weight bearing RLE Weight Bearing: Weight bearing as tolerated LLE Weight Bearing: Weight bearing as tolerated  Pain: At end of session pt rates pain as 5/10 - RN present for medication administration.  Agitated Behavior Scale: TBI Observation Details Observation Environment: CIR Start of observation period - Date: 05/28/20 Start of observation period - Time: 1546 End of observation period - Date: 05/28/20 End of observation period - Time: 1612 Agitated Behavior Scale (DO NOT LEAVE BLANKS) Short attention span, easy  distractibility, inability to concentrate: Absent Impulsive, impatient, low tolerance for pain or frustration: Absent Uncooperative, resistant to care, demanding: Absent Violent and/or  threatening violence toward people or property: Absent Explosive and/or unpredictable anger: Absent Rocking, rubbing, moaning, or other self-stimulating behavior: Absent Pulling at tubes, restraints, etc.: Absent Wandering from treatment areas: Absent Restlessness, pacing, excessive movement: Absent Repetitive behaviors, motor, and/or verbal: Absent Rapid, loud, or excessive talking: Absent Sudden changes of mood: Absent Easily initiated or excessive crying and/or laughter: Absent Self-abusiveness, physical and/or verbal: Absent Agitated behavior scale total score: 14    Therapy/Group: Individual Therapy  Ginny Forth , PT, DPT, CSRS  05/28/2020, 6:41 PM

## 2020-05-28 NOTE — Progress Notes (Signed)
Physical Therapy Session Note  Patient Details  Name: Jeremy George. MRN: [id number] Date of Birth: [birth year]  Today's Date: 05/28/2020 PT Individual Time: 7169-6789 PT Individual Time Calculation (min): 40 min   Short Term Goals: Week 1:  PT Short Term Goal 1 (Week 1): Patient will perform rolling L with mod A. PT Short Term Goal 1 - Progress (Week 1): Met PT Short Term Goal 2 (Week 1): Patient will perform supine>sit using log roll technique to the L with max A. PT Short Term Goal 2 - Progress (Week 1): Met PT Short Term Goal 3 (Week 1): Patient will perform basic transfers with max A +2. PT Short Term Goal 3 - Progress (Week 1): Progressing toward goal PT Short Term Goal 4 (Week 1): Patient will tolerate sitting OOB >30 min 2/7 days. PT Short Term Goal 4 - Progress (Week 1): Met Week 2:  PT Short Term Goal 1 (Week 2): Patient will perform bed mobility with mod-max A of 1 person consistently. PT Short Term Goal 1 - Progress (Week 2): Met PT Short Term Goal 2 (Week 2): Patient will perform standing balance >1 min while maintaining weight bearing precautions with max A +2. PT Short Term Goal 2 - Progress (Week 2): Met PT Short Term Goal 3 (Week 2): Patient will perform basic transfers with max A +1 consistently. PT Short Term Goal 3 - Progress (Week 2): Met PT Short Term Goal 4 (Week 2): Patient will initiate manual w/c mobility. PT Short Term Goal 4 - Progress (Week 2): Met Week 3:  PT Short Term Goal 1 (Week 3): Pt will perform supine<>sit with mod assist of 1 person consistently PT Short Term Goal 1 - Progress (Week 3): Met PT Short Term Goal 2 (Week 3): Pt will perform bed<>chair transfers with mod assist of 1 person consistently in both directions PT Short Term Goal 2 - Progress (Week 3): Met PT Short Term Goal 3 (Week 3): Pt will perform sit<>stands using R UE support and mod assist of 1 person consistently PT Short Term Goal 3 - Progress (Week 3): Met PT Short Term Goal  4 (Week 3): Pt will propel wheelchair 84f with supervision PT Short Term Goal 4 - Progress (Week 3): Progressing toward goal Week 4:  PT Short Term Goal 1 (Week 4): Patient will perform all transfers with min A consistently. PT Short Term Goal 1 - Progress (Week 4): Met PT Short Term Goal 2 (Week 4): Paitent will ambulate 50 ft using LRAD with min A consistently. PT Short Term Goal 2 - Progress (Week 4): Met PT Short Term Goal 3 (Week 4): Patient will improved knee extension ROM to <10 deg lacking form neutral. (knee extension to neutral this week, however, reduced hip flexor extension with muscle guarding and intermittent spasms) PT Short Term Goal 3 - Progress (Week 4): Met  Skilled Therapeutic Interventions/Progress Updates:    Pain:  Pt reports global L hip pain w/ROM exercises.  Treatment to tolerance.  Rest breaks and repositioning as needed. Pt initially oob in wc, agreeable to add on session to address hip ROM/pain Pt transported to gym. Gait 790fw/Loftstrand, significant bilat hip and knee flexion which pt attributes to L hip pain.   turn/sit to mat w/cga, sit to supine w/wupervision.  Pt unable to lie flat due to L hip pain/tightness.  LLE elevated w/blue bolster.  TKEs x 20 w/end range tightness Clamshells w/progressive increase in abd/ER achieved x 25, slow/rythmic motion Bridge x  15 partial rom slidelying w/powderboard progressive hip flexion, extension w/emphais on increasing hip estension x 20 Educated pt re: guarding due to pain and ROM losses.  Pt reports hot shower relieves pain indicating likely soft tissue restrictions as large factor in ROM restrictions/pain. Returns supne and improved extension of L hp and knee but remains limited by pain.   stand pivot transfer to wc w/cga, pt transported to room, left oob in wc w/mother at side, needs in reach.   Therapy Documentation Precautions:  Precautions Precautions: Fall Precaution Booklet Issued: No Precaution Comments:  PEG, posterior hip precautions Restrictions Weight Bearing Restrictions: Yes RUE Weight Bearing: Weight bearing as tolerated LUE Weight Bearing: Non weight bearing RLE Weight Bearing: Weight bearing as tolerated LLE Weight Bearing: Weight bearing as tolerated    Therapy/Group: Individual Therapy  Callie Fielding, [place] 05/28/2020, 12:20 PM
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
## 2020-05-28 NOTE — Progress Notes (Signed)
Occupational Therapy TBI Note  Patient Details  Name: Jeremy George. MRN: [id number] Date of Birth: 10/05/1995  Today's Date: 05/28/2020 OT Individual Time: 7829-5621 OT Individual Time Calculation (min): 60 min    Short Term Goals: Week 1:  OT Short Term Goal 1 (Week 1): Pt will sit EOB with max A during ADLs OT Short Term Goal 1 - Progress (Week 1): Met OT Short Term Goal 2 (Week 1): Pt will attend to oral care task with no more than mod cueing OT Short Term Goal 2 - Progress (Week 1): Met OT Short Term Goal 3 (Week 1): Pt will wash UB with max A OT Short Term Goal 3 - Progress (Week 1): Met  Skilled Therapeutic Interventions/Progress Updates:    1:1. Pt received in w/c and able to teach back technique to use JAS brace and teach other therapist who asked about it in the gym. Pt spends 15 min in extension and 15 min flexion during tx. Focus of session on new learning when using game of Quirkle. Initially pt requires total cues to follow rules and learn scoring. Skilled fading of cues and question cues utilized to reduce cueing to min cues. Pt able to count and keep track of how many rounds played as OT states "since you dont like this game we will play 10 rounds and then play uno." No cuing required to keep track of 10 rounds. Pt able to play uno with no cues uring BUE. Pt propels w/c back to room with mom present to A with a shower.  Therapy Documentation Precautions:  Precautions Precautions: Fall Precaution Booklet Issued: No Precaution Comments: PEG, posterior hip precautions Restrictions Weight Bearing Restrictions: Yes RUE Weight Bearing: Weight bearing as tolerated LUE Weight Bearing: Non weight bearing RLE Weight Bearing: Weight bearing as tolerated LLE Weight Bearing: Weight bearing as tolerated General:   Vital Signs: Therapy Vitals Temp: 98.3 F (36.8 C) Temp Source: Oral Pulse Rate: 94 Resp: 18 BP: 104/79 Patient Position (if appropriate): Lying Oxygen  Therapy SpO2: 100 % O2 Device: Room Air Pain:   Agitated Behavior Scale: TBI Observation Details Observation Environment: CIR Start of observation period - Date: 05/28/20 Start of observation period - Time: 0945 End of observation period - Date: 05/28/20 End of observation period - Time: 1045 Agitated Behavior Scale (DO NOT LEAVE BLANKS) Short attention span, easy distractibility, inability to concentrate: Absent Impulsive, impatient, low tolerance for pain or frustration: Present to a moderate degree Uncooperative, resistant to care, demanding: Present to a slight degree Violent and/or threatening violence toward people or property: Absent Explosive and/or unpredictable anger: Absent Rocking, rubbing, moaning, or other self-stimulating behavior: Absent Pulling at tubes, restraints, etc.: Absent Wandering from treatment areas: Absent Restlessness, pacing, excessive movement: Absent Repetitive behaviors, motor, and/or verbal: Absent Rapid, loud, or excessive talking: Absent Sudden changes of mood: Absent Easily initiated or excessive crying and/or laughter: Absent Self-abusiveness, physical and/or verbal: Absent Agitated behavior scale total score: 17  ADL: ADL Eating: Dependent Where Assessed-Eating: Edge of bed Grooming: Maximal assistance Where Assessed-Grooming: Edge of bed Upper Body Bathing: Unable to assess Lower Body Bathing: Unable to assess Upper Body Dressing: Unable to assess Lower Body Dressing: Unable to assess Toileting: Unable to assess Toilet Transfer: Unable to assess Tub/Shower Transfer: Unable to assess Gaffer Transfer: Unable to assess Social research officer, government Method: Unable to assess Vision   Perception    Praxis   Exercises:   Other Treatments:     Therapy/Group: Individual Therapy  Jeremy George Jeremy George 05/28/2020, 6:52 AM

## 2020-05-29 DIAGNOSIS — S069X1S Unspecified intracranial injury with loss of consciousness of 30 minutes or less, sequela: Secondary | ICD-10-CM

## 2020-05-29 MED ORDER — SORBITOL 70 % SOLN
30.0000 mL | Freq: Once | Status: AC
  Administered 2020-05-29: 30 mL via ORAL
  Filled 2020-05-29: qty 30

## 2020-05-29 MED ORDER — POLYETHYLENE GLYCOL 3350 17 G PO PACK
17.0000 g | PACK | Freq: Two times a day (BID) | ORAL | Status: DC
  Administered 2020-05-29 – 2020-05-31 (×4): 17 g via ORAL
  Filled 2020-05-29 (×9): qty 1

## 2020-05-29 NOTE — Progress Notes (Signed)
Patient ID: Marquita Palms., male   DOB: [birth year], 25 y.o.   MRN: [id number]  SW ordered w/c, lofstrand crutch from [place] via parachute. Unsure if lofstrand crutch or extra long elevating leg rests are available and waiting on update.  SW met with pt, pt mother, and pt girlfriend in room to inform on above. SW reviewed charity process with DME, and not applying for [facility] until date of discharge.   Pt set up for [facility] medication assistance program.   Loralee Pacas, MSW, [place] Office: [phone] Cell: [phone] Fax: [phone]

## 2020-05-29 NOTE — Progress Notes (Signed)
Speech Language Pathology TBI Note  Patient Details  Name: Jeremy George. MRN: [id number] Date of Birth: 17-Feb-1996  Today's Date: 05/29/2020 SLP Individual Time: 8469-6295 SLP Individual Time Calculation (min): 55 min  Short Term Goals: Week 5: SLP Short Term Goal 1 (Week 5): STGs=LTGs due to ELOS  Skilled Therapeutic Interventions: Skilled treatment session focused on family education with the patient's mother and girlfriend. SLP provided extensive education regarding importance of 24 hour supervision in order to maximize patient's overall safety and ensure safe decision making. SLP provided strategies for behavior management, sustained attention, recall, problem solving and overall safety. Also discussed the importance of locking up all firearms and keeping all alcohol and car keys away from the patient. All verbalized understanding. Patient left sitting EOB with family present. Continue with current plan of care.      Pain No/Denies Pain   Agitated Behavior Scale: TBI Observation Details Observation Environment: Patient's room Start of observation period - Date: 05/29/20 Start of observation period - Time: 1030 End of observation period - Date: 05/29/20 End of observation period - Time: 1125 Agitated Behavior Scale (DO NOT LEAVE BLANKS) Short attention span, easy distractibility, inability to concentrate: Absent Impulsive, impatient, low tolerance for pain or frustration: Present to a slight degree Uncooperative, resistant to care, demanding: Absent Violent and/or threatening violence toward people or property: Absent Explosive and/or unpredictable anger: Absent Rocking, rubbing, moaning, or other self-stimulating behavior: Absent Pulling at tubes, restraints, etc.: Absent Wandering from treatment areas: Absent Restlessness, pacing, excessive movement: Absent Repetitive behaviors, motor, and/or verbal: Absent Rapid, loud, or excessive talking: Absent Sudden changes of  mood: Absent Easily initiated or excessive crying and/or laughter: Absent Self-abusiveness, physical and/or verbal: Absent Agitated behavior scale total score: 15  Therapy/Group: Individual Therapy  Jackee Glasner 05/29/2020, 3:53 PM

## 2020-05-29 NOTE — Progress Notes (Signed)
Physical Therapy Session Note  Patient Details  Name: Jeremy George. MRN: [id number] Date of Birth: Sep 21, 1995  Today's Date: 05/29/2020 PT Individual Time: 1425-1500 PT Individual Time Calculation (min): 35 min   Short Term Goals: Week 5:  PT Short Term Goal 1 (Week 5): STG=LTG due to ELOS.  Skilled Therapeutic Interventions/Progress Updates:   Pt receiving sitting on toilet. PT returned in 46mnutes, and Pt received sitting in WC and agreeable to PT. Pt performed WC mobility through hall 2 x 1510fwith hemi technique. Problem solving task to complete pipe tree puzzle with cues for error awareness of hight difficulty puzzle. Standing balance to perform Wii bowling with LUE. Cues for gentle ROM and slow speed ot prevent pain in the LUE, but pt noted to extend LLE into End range on 4th attempt increasing Pain. Pt completed game with RUE sitting in [place] returned to room and left sitting in EOB with SO in room, and with call bell in reach and all needs met.          Therapy Documentation Precautions:  Precautions Precautions: Fall Precaution Booklet Issued: No Precaution Comments: PEG, posterior hip precautions Restrictions Weight Bearing Restrictions: Yes RUE Weight Bearing: Weight bearing as tolerated LUE Weight Bearing: Non weight bearing RLE Weight Bearing: Weight bearing as tolerated LLE Weight Bearing: Weight bearing as tolerated General: PT Amount of Missed Time (min): 25 Minutes PT Missed Treatment Reason: Toileting Vital Signs: Therapy Vitals Temp: 98.3 F (36.8 C) Temp Source: Oral Pulse Rate: 89 Resp: 18 BP: 110/65 Patient Position (if appropriate): Sitting Oxygen Therapy SpO2: 100 % O2 Device: Room Air Pain: 4/10 L elbow. Pt repositioned.   Therapy/Group: Individual Therapy  AuLorie Phenix/09/2020, 5:55 PM
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
## 2020-05-29 NOTE — Progress Notes (Deleted)
Speech Language Pathology Daily Session Note  Patient Details  Name: Jeremy George. MRN: [id number] Date of Birth: [birth year]  Today's Date: 05/29/2020 SLP Individual Time: 3614-4315 SLP Individual Time Calculation (min): 55 min  Short Term Goals: Week 5: SLP Short Term Goal 1 (Week 5): STGs=LTGs due to ELOS  Skilled Therapeutic Interventions: Skilled treatment session focused on family education with the patient's mother and girlfriend. SLP provided extensive education regarding importance of 24 hour supervision in order to maximize patient's overall safety and ensure safe decision making. SLP provided strategies for behavior management, sustained attention, recall, problem solving and overall safety. Also discussed the importance of locking up all firearms and keeping all alcohol and car keys away from the patient. All verbalized understanding. Patient left sitting EOB with family present. Continue with current plan of care.      Pain No/Denies Pain   Therapy/Group: Individual Therapy  Vernestine Brodhead 05/29/2020, 3:45 PM

## 2020-05-29 NOTE — Progress Notes (Signed)
Occupational Therapy TBI Note  Patient Details  Name: Jeremy George. MRN: [id number] Date of Birth: 06/14/1995  Today's Date: 05/29/2020 OT Individual Time: 1300-1355 OT Individual Time Calculation (min): 55 min    Short Term Goals: Week 1:  OT Short Term Goal 1 (Week 1): Pt will sit EOB with max A during ADLs OT Short Term Goal 1 - Progress (Week 1): Met OT Short Term Goal 2 (Week 1): Pt will attend to oral care task with no more than mod cueing OT Short Term Goal 2 - Progress (Week 1): Met OT Short Term Goal 3 (Week 1): Pt will wash UB with max A OT Short Term Goal 3 - Progress (Week 1): Met  Skilled Therapeutic Interventions/Progress Updates:    1:1. Pt received in bed eating. Girlfriend and mother present. OT educates on current ADL performance, ROM/brace application/use/protocol, hamstring/calf stretches, no use of alcohol/firearms/driving/power tools, safety awareness during transfers, w/c parts management (brakes and leg rests), promoting independence, providing guarding assist to supervision, energy conservation, use of board games for new learning, low frustration tolerance/light/sound sensitivity, poor dual task processing and community resources ([employer] yoga program for TBI survivors and caregivers). Mom taking notes during education and girlfriend intermittently verbalized understanding. Mother completes stand pivot transfer with pt to/from w/c>toilet with cue to lock  Brakes. Girlfriend completes transfer off of toilet with no cuing. Pt completes functional mobility in tx gym with 1 arm crutch with OT discussing leg length/tightness implications and how to provide S-CGA. Pt girlfrined able to return demo with no cuing. Exited session with pt seated in bed, exit alarm on and call light in reach   Therapy Documentation Precautions:  Precautions Precautions: Fall Precaution Booklet Issued: No Precaution Comments: PEG, posterior hip precautions Restrictions Weight  Bearing Restrictions: Yes RUE Weight Bearing: Weight bearing as tolerated LUE Weight Bearing: Non weight bearing RLE Weight Bearing: Weight bearing as tolerated LLE Weight Bearing: Weight bearing as tolerated General:   Vital Signs: Therapy Vitals Temp: 98.1 F (36.7 C) Temp Source: Oral Pulse Rate: 84 Resp: 18 BP: 113/67 Patient Position (if appropriate): Lying Oxygen Therapy SpO2: 100 % O2 Device: Room Air Pain:   Agitated Behavior Scale: TBI  Observation Details Observation Environment: pts room Start of observation period - Date: 05/29/20 Start of observation period - Time: 1300 End of observation period - Date: 05/29/20 End of observation period - Time: 1400 Agitated Behavior Scale (DO NOT LEAVE BLANKS) Short attention span, easy distractibility, inability to concentrate: Absent Impulsive, impatient, low tolerance for pain or frustration: Present to a slight degree Uncooperative, resistant to care, demanding: Absent Violent and/or threatening violence toward people or property: Absent Explosive and/or unpredictable anger: Absent Rocking, rubbing, moaning, or other self-stimulating behavior: Absent Pulling at tubes, restraints, etc.: Absent Wandering from treatment areas: Absent Restlessness, pacing, excessive movement: Absent Repetitive behaviors, motor, and/or verbal: Absent Rapid, loud, or excessive talking: Absent Sudden changes of mood: Absent Easily initiated or excessive crying and/or laughter: Absent Self-abusiveness, physical and/or verbal: Absent Agitated behavior scale total score: 15  ADL: ADL Eating: Dependent Where Assessed-Eating: Edge of bed Grooming: Maximal assistance Where Assessed-Grooming: Edge of bed Upper Body Bathing: Unable to assess Lower Body Bathing: Unable to assess Upper Body Dressing: Unable to assess Lower Body Dressing: Unable to assess Toileting: Unable to assess Toilet Transfer: Unable to assess Tub/Shower Transfer:  Unable to assess Gaffer Transfer: Unable to assess Social research officer, government Method: Unable to assess Midwife  Praxis   Exercises:   Other Treatments:     Therapy/Group: Individual Therapy  Tonny Branch 05/29/2020, 6:56 AM
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
## 2020-05-30 NOTE — Progress Notes (Signed)
Speech Language Pathology Daily Session Note  Patient Details  Name: Jeremy George. MRN: [id number] Date of Birth: 02-Nov-1995  Today's Date: 05/30/2020 SLP Individual Time: 7494-4967 SLP Individual Time Calculation (min): 45 min  Short Term Goals: Week 5: SLP Short Term Goal 1 (Week 5): STGs=LTGs due to ELOS  Skilled Therapeutic Interventions:   Patient seen with Mom present in room for skilled ST session focusing on cognitive function goals. Patient participated in completing subtests from [facility] and scores placed him in mild impairment range for Memory and WNL range for Attention. He was also able to recall and describe strategies learned from PT regarding using stairs at home and recalled primary PT's name. Mom did not have any questions/concerns regarding upcoming discharge home. Patient continues to benefit from skilled SLP intervention to maximize cognitive function goals prior to discharge.  Pain Pain Assessment Pain Scale: 0-10 Pain Score: 0-No pain  Therapy/Group: Individual Therapy  Angela Nevin, MA, CCC-SLP Speech Therapy

## 2020-05-31 NOTE — Discharge Summary (Signed)
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 [address] [phone]        Allena Napoleon, MD Follow up in 4 week(s).   Specialty: Plastic Surgery Why: call for appointment Contact information: [address] [place] [address] [phone]               Signed: Mcarthur Rossetti Ahijah Devery 06/02/2020, 5:10 AM
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
## 2020-06-01 ENCOUNTER — Other Ambulatory Visit (HOSPITAL_COMMUNITY): Payer: Self-pay

## 2020-06-01 LAB — CBC
HCT: 30.3 % — ABNORMAL LOW (ref 39.0–52.0)
Hemoglobin: 9.4 g/dL — ABNORMAL LOW (ref 13.0–17.0)
MCH: 26 pg (ref 26.0–34.0)
MCHC: 31 g/dL (ref 30.0–36.0)
MCV: 83.9 fL (ref 80.0–100.0)
Platelets: 390 10*3/uL (ref 150–400)
RBC: 3.61 MIL/uL — ABNORMAL LOW (ref 4.22–5.81)
RDW: 14.7 % (ref 11.5–15.5)
WBC: 6.1 10*3/uL (ref 4.0–10.5)
nRBC: 0 % (ref 0.0–0.2)

## 2020-06-01 LAB — BASIC METABOLIC PANEL
Anion gap: 7 (ref 5–15)
BUN: 6 mg/dL (ref 6–20)
CO2: 26 mmol/L (ref 22–32)
Calcium: 8.9 mg/dL (ref 8.9–10.3)
Chloride: 105 mmol/L (ref 98–111)
Creatinine, Ser: 0.59 mg/dL — ABNORMAL LOW (ref 0.61–1.24)
GFR, Estimated: >60 mL/min (ref >60)
Glucose, Bld: 94 mg/dL (ref 70–99)
Potassium: 3.6 mmol/L (ref 3.5–5.1)
Sodium: 138 mmol/L (ref 135–145)

## 2020-06-01 MED ORDER — QUETIAPINE FUMARATE 50 MG PO TABS
100.0000 mg | ORAL_TABLET | Freq: Every day | ORAL | Status: DC
  Administered 2020-06-01: 100 mg via ORAL
  Filled 2020-06-01: qty 2

## 2020-06-01 MED ORDER — PROPRANOLOL HCL ER 120 MG PO CP24
240.0000 mg | ORAL_CAPSULE | Freq: Every day | ORAL | 0 refills | Status: DC
  Filled 2020-06-01: qty 60, 30d supply, fill #0

## 2020-06-01 MED ORDER — ACETAMINOPHEN 325 MG PO TABS: 325.0000 mg | ORAL_TABLET | ORAL | Status: DC | PRN

## 2020-06-01 MED ORDER — ADULT MULTIVITAMIN W/MINERALS CH: 1.0000 | ORAL_TABLET | Freq: Every day | ORAL | Status: DC

## 2020-06-01 MED ORDER — OXYCODONE HCL 5 MG PO TABS
5.0000 mg | ORAL_TABLET | Freq: Four times a day (QID) | ORAL | 0 refills | Status: DC | PRN
  Filled 2020-06-01: qty 30, 4d supply, fill #0

## 2020-06-01 MED ORDER — CITALOPRAM HYDROBROMIDE 10 MG PO TABS
10.0000 mg | ORAL_TABLET | Freq: Every day | ORAL | 0 refills | Status: DC
  Filled 2020-06-01: qty 30, 30d supply, fill #0

## 2020-06-01 MED ORDER — METHOCARBAMOL 500 MG PO TABS: 500.0000 mg | ORAL_TABLET | Freq: Three times a day (TID) | ORAL | Status: DC | PRN

## 2020-06-01 MED ORDER — OXYCODONE HCL 10 MG PO TABS
5.0000 mg | ORAL_TABLET | Freq: Four times a day (QID) | ORAL | 0 refills | Status: DC | PRN
  Filled 2020-06-01: qty 28, 7d supply, fill #0

## 2020-06-01 MED ORDER — PROPRANOLOL HCL ER 60 MG PO CP24
240.0000 mg | ORAL_CAPSULE | Freq: Every day | ORAL | Status: DC
  Administered 2020-06-02: 240 mg via ORAL
  Filled 2020-06-01 (×3): qty 2
  Filled 2020-06-01: qty 4

## 2020-06-01 MED ORDER — QUETIAPINE FUMARATE 100 MG PO TABS
100.0000 mg | ORAL_TABLET | Freq: Every day | ORAL | 0 refills | Status: DC
  Filled 2020-06-01: qty 30, 30d supply, fill #0

## 2020-06-01 MED ORDER — METHOCARBAMOL 500 MG PO TABS
500.0000 mg | ORAL_TABLET | Freq: Three times a day (TID) | ORAL | 0 refills | Status: DC | PRN
  Filled 2020-06-01: qty 60, 20d supply, fill #0

## 2020-06-01 MED ORDER — POLYETHYLENE GLYCOL 3350 17 G PO PACK: 17.0000 g | PACK | Freq: Two times a day (BID) | ORAL | 0 refills | Status: DC

## 2020-06-01 MED ORDER — METHOCARBAMOL 500 MG PO TABS: 500.0000 mg | ORAL_TABLET | Freq: Three times a day (TID) | ORAL | Status: DC

## 2020-06-01 MED ORDER — DIVALPROEX SODIUM 250 MG PO DR TAB
750.0000 mg | DELAYED_RELEASE_TABLET | Freq: Three times a day (TID) | ORAL | 0 refills | Status: DC
  Filled 2020-06-01: qty 226, 25d supply, fill #0

## 2020-06-01 MED ORDER — LIDOCAINE 5 % EX PTCH
1.0000 | MEDICATED_PATCH | CUTANEOUS | 0 refills | Status: DC
  Filled 2020-06-01: qty 30, 30d supply, fill #0
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
## 2020-06-01 NOTE — Progress Notes (Signed)
Patient ID: Jeremy George., male   DOB: [birth year], 25 y.o.   MRN: [id number]  [facility] [phone] [phone]; email:[email or web address])  to follow-up about pt application status, and waiting on follow-up.   Cecile Sheerer, MSW, LCSWA Office: [phone] Cell: [phone] Fax: [phone]
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
## 2020-06-01 NOTE — Progress Notes (Signed)
Speech Language Pathology Discharge Summary  Patient Details  Name: Jeremy George. MRN: [id number] Date of Birth: 1996/01/12  Today's Date: 06/01/2020 SLP Individual Time: 1300-1355 SLP Individual Time Calculation (min): 55 min   Skilled Therapeutic Interventions:  Skilled treatment session focused on cognitive goals. SLP facilitated session by providing extra time for navigation to the gift shop and to recall and locate 5 items from the gift shop. SLP also facilitated session by providing overall intermittent supervision level verbal cues for anticipatory awareness in regards to safety at home. Patient requested to get back to bed at end of session and left with alarm on and all needs within reach.    Patient has met 8 of 8 long term goals.  Patient to discharge at overall Supervision level.   Reasons goals not met: N/A   Clinical Impression/Discharge Summary: Patient has made excellent gains and has met 8 of 8 LTGs this admission. Currently, patient demonstrates behaviors consistent with a Rancho Level VIII and requires overall supervision level verbal cues to complete functional and mildly complex tasks safely in regards to problem solving, attention, awareness and recall. Patient is also consuming regular textures with thin liquids with minimal overt s/s of aspiration and is overall Mod I for use of swallowing compensatory strategies. Patient and family education is complete and patient will discharge home with 24 hour supervision. Patient would benefit from f/u SLP services to maximize his cognitive functioning and overall functional independence in order to reduce caregiver burden.   Care Partner:  Caregiver Able to Provide Assistance: Yes  Type of Caregiver Assistance: Physical;Cognitive  Recommendation:  24 hour supervision/assistance;Home Health SLP  Rationale for SLP Follow Up: Maximize cognitive function and independence   Equipment: N/A   Reasons for discharge:  Discharged from hospital;Treatment goals met   Patient/Family Agrees with Progress Made and Goals Achieved: Yes    Denee Boeder, [place] 06/01/2020, 6:45 AM

## 2020-06-01 NOTE — Progress Notes (Signed)
Inpatient Rehabilitation Care Coordinator Discharge Note  The overall goal for the admission was met for:   Discharge location: Yes. D/c to home with 24/7 care from mother.   Length of Stay: Yes. 36 days.   Discharge activity level: Yes. Supervision overall.   Home/community participation: Yes. Limited.  Services provided included: MD, RD, PT, OT, SLP, RN, CM, TR, Pharmacy, Neuropsych and SW  Financial Services: Other: Uninsured  Choices offered to/list presented to:N/A  Follow-up services arranged: Outpatient: [place] for outpatient PT/OT/SLP and DME: [facility] (charity) w/c, lofstrand crutches; charity HH declined  Comments (or additional information):  Patient/Family verbalized understanding of follow-up arrangements: Yes  Individual responsible for coordination of the follow-up plan: contact pt mother Jeremy George [phone]  Confirmed correct DME delivered: [name] 06/01/2020    [name]

## 2020-06-01 NOTE — Discharge Instructions (Signed)
Inpatient Rehab Discharge Instructions  Jeremy George Jeremy George. Discharge date and time: No discharge date for patient encounter.   Activities/Precautions/ Functional Status: Activity: Weightbearing as tolerated right upper extremity nonweightbearing left upper extremity weightbearing as tolerated right lower extremity weightbearing as tolerated left lower extremity Diet: Regular Wound Care: Routine skin checks Functional status:  ___ No restrictions     ___ Walk up steps independently ___ 24/7 supervision/assistance   ___ Walk up steps with assistance ___ Intermittent supervision/assistance  ___ Bathe/dress independently ___ Walk with walker     _x__ Bathe/dress with assistance ___ Walk Independently    ___ Shower independently ___ Walk with assistance    ___ Shower with assistance ___ No alcohol     ___ Return to work/school ________  COMMUNITY REFERRALS UPON DISCHARGE:    Home Health:   PT     OT     ST       SW                  Agency: Phone:    Medical Equipment/Items Ordered: lofstrand crutch, wheelchair (charity)                                                 Agency/Supplier: [employer] [phone]  GENERAL COMMUNITY RESOURCES FOR PATIENT/FAMILY: Community resources provided to establish primary care in area: [facility] or [facility].   Special Instructions: No driving smoking or alcohol   My questions have been answered and I understand these instructions. I will adhere to these goals and the provided educational materials after my discharge from the hospital.  Patient/Caregiver Signature _______________________________ Date __________  Clinician Signature _______________________________________ Date __________  Please bring this form and your medication list with you to all your follow-up doctor's appointments.

## 2020-06-02 ENCOUNTER — Other Ambulatory Visit (HOSPITAL_COMMUNITY): Payer: Self-pay
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
## 2020-06-03 ENCOUNTER — Telehealth: Payer: Self-pay

## 2020-06-03 DIAGNOSIS — S062X9S Diffuse traumatic brain injury with loss of consciousness of unspecified duration, sequela: Secondary | ICD-10-CM

## 2020-06-03 NOTE — Telephone Encounter (Signed)
Orders entered for therapy

## 2020-06-03 NOTE — Telephone Encounter (Signed)
SW spoke with pt mother Bradly Bienenstock [phone]) to discuss [facility] charity referral denied, and discussed if open to outpatient therapies, and informed they will require some form of payment. She is open to outpatient therapies for son. Closet hospital is [facility].   Outpatient PT/OT/SLP referral to sent to [facility] (p:[phone]/f:[phone]).  No further SW intervention.   Case closed   Cecile Sheerer, MSW, [place] Office: [phone] Cell: [phone] Fax: [phone]

## 2020-06-03 NOTE — Telephone Encounter (Signed)
SW made efforts to make contact with pt mother to provide updates on charity [facility] declined,and wanted to discuss outpatient therapy for pt instead. No answer and voicemail not set up. Number for pt listed is not a working number. SW will continue to make efforts to make contact.

## 2020-06-04 ENCOUNTER — Telehealth: Payer: Self-pay

## 2020-06-04 ENCOUNTER — Ambulatory Visit (INDEPENDENT_AMBULATORY_CARE_PROVIDER_SITE_OTHER): Payer: Self-pay | Admitting: Plastic Surgery

## 2020-06-04 ENCOUNTER — Other Ambulatory Visit: Payer: Self-pay

## 2020-06-04 DIAGNOSIS — S0993XA Unspecified injury of face, initial encounter: Secondary | ICD-10-CM

## 2020-06-04 NOTE — Telephone Encounter (Signed)
Mother called stating pt is moving in with his girlfriend and is not longer under the care of his mother.
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
## 2020-06-04 NOTE — Telephone Encounter (Signed)
Transitional Care call--Kisha-mother of patient    1. Are you/is patient experiencing any problems since coming home? No Are there any questions regarding any aspect of care? No 2. Are there any questions regarding medications administration/dosing? Ritalin-patient feels he should still be on it Are meds being taken as prescribed? Yes Patient should review meds with caller to confirm 3. Have there been any falls? No 4. Has Home Health been to the house and/or have they contacted you? Yes outpt therapy If not, have you tried to contact them? Can we help you contact them? 5. Are bowels and bladder emptying properly? Yes Are there any unexpected incontinence issues? No If applicable, is patient following bowel/bladder programs? 6. Any fevers, problems with breathing, unexpected pain? No 7. Are there any skin problems or new areas of breakdown? No 8. Has the patient/family member arranged specialty MD follow up (ie cardiology/neurology/renal/surgical/etc)? Yes Can we help arrange? 9. Does the patient need any other services or support that we can help arrange? No 10. Are caregivers following through as expected in assisting the patient? yes 11. Has the patient quit smoking, drinking alcohol, or using drugs as recommended? Started back smoking cigarettes  Appointment time 1:00 pm , arrive time 12;45 pm on 06/15/20 with Riley Lam then Dr. Riley Kill [address] [phone]

## 2020-06-08 ENCOUNTER — Telehealth: Payer: Self-pay

## 2020-06-08 NOTE — Telephone Encounter (Signed)
SW spoke with Jeremy George and informed on computer update with her information, and will followup if pt accepted for TBI funding.

## 2020-06-08 NOTE — Telephone Encounter (Signed)
SW received message that pt mother Bradly Bienenstock will no longer be managing his care needs. Per EMR, pt moving in with his girlfriend.   SW spoke with pt mother Bradly Bienenstock [phone]) who reported that pt told her it was causing too much stress for him on his brain staying at his home, and trying to see his girlfriend as well, and wanted to move in with her. She stated she will no longer be manages his care. SW informed will provide updates once there is more information on if pt will get TBI funding, and SW will update demo to reflect s/o information in the system in the event medical staff needed to make contact.   SW left message for girlfriend Nehemiah Settle [phone]) to inform on changes made to our computer system, and SW will follow-up once there is more information on TBI funding.

## 2020-06-11 ENCOUNTER — Telehealth: Payer: Self-pay
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
## 2020-06-12 NOTE — Telephone Encounter (Signed)
Letter written and faxed.

## 2020-06-12 NOTE — Telephone Encounter (Signed)
Letter received by Antoine P[place] office today.

## 2020-06-15 ENCOUNTER — Other Ambulatory Visit: Payer: Self-pay

## 2020-06-15 ENCOUNTER — Encounter: Payer: Medicaid Other | Attending: Registered Nurse | Admitting: Registered Nurse

## 2020-06-15 VITALS — BP 109/70 | HR 96 | Temp 98.8°F | Ht 75.0 in | Wt 150.4 lb

## 2020-06-15 DIAGNOSIS — S73005D Unspecified dislocation of left hip, subsequent encounter: Secondary | ICD-10-CM | POA: Diagnosis not present

## 2020-06-15 DIAGNOSIS — S2231XD Fracture of one rib, right side, subsequent encounter for fracture with routine healing: Secondary | ICD-10-CM | POA: Diagnosis not present

## 2020-06-15 DIAGNOSIS — S42402D Unspecified fracture of lower end of left humerus, subsequent encounter for fracture with routine healing: Secondary | ICD-10-CM | POA: Diagnosis not present

## 2020-06-15 DIAGNOSIS — F17210 Nicotine dependence, cigarettes, uncomplicated: Secondary | ICD-10-CM | POA: Insufficient documentation

## 2020-06-15 DIAGNOSIS — Y939 Activity, unspecified: Secondary | ICD-10-CM | POA: Insufficient documentation

## 2020-06-15 DIAGNOSIS — T1490XA Injury, unspecified, initial encounter: Secondary | ICD-10-CM

## 2020-06-15 DIAGNOSIS — S069X1D Unspecified intracranial injury with loss of consciousness of 30 minutes or less, subsequent encounter: Secondary | ICD-10-CM

## 2020-06-15 DIAGNOSIS — S32402D Unspecified fracture of left acetabulum, subsequent encounter for fracture with routine healing: Secondary | ICD-10-CM | POA: Insufficient documentation

## 2020-06-15 DIAGNOSIS — S069X9D Unspecified intracranial injury with loss of consciousness of unspecified duration, subsequent encounter: Secondary | ICD-10-CM | POA: Insufficient documentation

## 2020-06-15 DIAGNOSIS — Y929 Unspecified place or not applicable: Secondary | ICD-10-CM | POA: Diagnosis not present

## 2020-06-15 DIAGNOSIS — S32059D Unspecified fracture of fifth lumbar vertebra, subsequent encounter for fracture with routine healing: Secondary | ICD-10-CM | POA: Insufficient documentation

## 2020-06-15 MED ORDER — METHOCARBAMOL 500 MG PO TABS: 500.0000 mg | ORAL_TABLET | Freq: Three times a day (TID) | ORAL | 0 refills | Status: DC | PRN

## 2020-06-15 MED ORDER — CITALOPRAM HYDROBROMIDE 10 MG PO TABS: 10.0000 mg | ORAL_TABLET | Freq: Every day | ORAL | 0 refills | Status: DC

## 2020-06-15 MED ORDER — QUETIAPINE FUMARATE 100 MG PO TABS: 100.0000 mg | ORAL_TABLET | Freq: Every day | ORAL | 0 refills | Status: DC

## 2020-06-15 MED ORDER — PROPRANOLOL HCL ER 120 MG PO CP24: 240.0000 mg | ORAL_CAPSULE | Freq: Every day | ORAL | 0 refills | Status: DC

## 2020-06-15 MED ORDER — DIVALPROEX SODIUM 250 MG PO DR TAB: 750.0000 mg | DELAYED_RELEASE_TABLET | Freq: Three times a day (TID) | ORAL | 0 refills | Status: DC

## 2020-06-15 NOTE — Patient Instructions (Signed)
[facility] [facility]:  [phone]  Call for Primary Care Appointment
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
## 2020-06-16 DIAGNOSIS — S32422D Displaced fracture of posterior wall of left acetabulum, subsequent encounter for fracture with routine healing: Secondary | ICD-10-CM | POA: Diagnosis not present

## 2020-06-16 DIAGNOSIS — S73005D Unspecified dislocation of left hip, subsequent encounter: Secondary | ICD-10-CM | POA: Diagnosis not present

## 2020-06-16 DIAGNOSIS — S42001D Fracture of unspecified part of right clavicle, subsequent encounter for fracture with routine healing: Secondary | ICD-10-CM | POA: Diagnosis not present

## 2020-06-16 DIAGNOSIS — S42412D Displaced simple supracondylar fracture without intercondylar fracture of left humerus, subsequent encounter for fracture with routine healing: Secondary | ICD-10-CM | POA: Diagnosis not present

## 2020-06-17 ENCOUNTER — Encounter: Payer: Self-pay | Admitting: Registered Nurse

## 2020-07-06 ENCOUNTER — Ambulatory Visit: Payer: Medicaid Other | Attending: Physical Medicine & Rehabilitation

## 2020-07-06 ENCOUNTER — Other Ambulatory Visit: Payer: Self-pay

## 2020-07-06 VITALS — BP 115/73 | HR 70

## 2020-07-06 DIAGNOSIS — M6281 Muscle weakness (generalized): Secondary | ICD-10-CM | POA: Diagnosis not present

## 2020-07-06 DIAGNOSIS — R278 Other lack of coordination: Secondary | ICD-10-CM | POA: Diagnosis not present

## 2020-07-06 DIAGNOSIS — R269 Unspecified abnormalities of gait and mobility: Secondary | ICD-10-CM | POA: Diagnosis not present

## 2020-07-06 DIAGNOSIS — R262 Difficulty in walking, not elsewhere classified: Secondary | ICD-10-CM | POA: Insufficient documentation
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
## 2020-07-08 ENCOUNTER — Ambulatory Visit: Payer: Medicaid Other

## 2020-07-13 ENCOUNTER — Ambulatory Visit: Payer: Medicaid Other

## 2020-07-13 ENCOUNTER — Other Ambulatory Visit: Payer: Self-pay

## 2020-07-13 DIAGNOSIS — M6281 Muscle weakness (generalized): Secondary | ICD-10-CM | POA: Diagnosis not present

## 2020-07-13 DIAGNOSIS — R278 Other lack of coordination: Secondary | ICD-10-CM

## 2020-07-13 DIAGNOSIS — R262 Difficulty in walking, not elsewhere classified: Secondary | ICD-10-CM | POA: Diagnosis not present

## 2020-07-13 DIAGNOSIS — R269 Unspecified abnormalities of gait and mobility: Secondary | ICD-10-CM | POA: Diagnosis not present
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
## 2020-07-15 ENCOUNTER — Ambulatory Visit: Payer: Medicaid Other

## 2020-07-17 ENCOUNTER — Other Ambulatory Visit: Payer: Self-pay

## 2020-07-17 ENCOUNTER — Ambulatory Visit: Payer: Medicaid Other

## 2020-07-17 DIAGNOSIS — R262 Difficulty in walking, not elsewhere classified: Secondary | ICD-10-CM

## 2020-07-17 DIAGNOSIS — R278 Other lack of coordination: Secondary | ICD-10-CM | POA: Diagnosis not present

## 2020-07-17 DIAGNOSIS — R269 Unspecified abnormalities of gait and mobility: Secondary | ICD-10-CM | POA: Diagnosis not present

## 2020-07-17 DIAGNOSIS — M6281 Muscle weakness (generalized): Secondary | ICD-10-CM | POA: Diagnosis not present
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
## 2020-07-22 ENCOUNTER — Encounter: Payer: Self-pay | Admitting: Physical Medicine & Rehabilitation

## 2020-07-22 ENCOUNTER — Encounter: Payer: Medicaid Other | Attending: Registered Nurse | Admitting: Physical Medicine & Rehabilitation

## 2020-07-22 ENCOUNTER — Other Ambulatory Visit: Payer: Self-pay

## 2020-07-22 DIAGNOSIS — S069X0S Unspecified intracranial injury without loss of consciousness, sequela: Secondary | ICD-10-CM | POA: Diagnosis not present

## 2020-07-22 DIAGNOSIS — S52279S Monteggia's fracture of unspecified ulna, sequela: Secondary | ICD-10-CM | POA: Diagnosis not present

## 2020-07-22 DIAGNOSIS — Z79899 Other long term (current) drug therapy: Secondary | ICD-10-CM | POA: Diagnosis not present

## 2020-07-22 DIAGNOSIS — S52272S Monteggia's fracture of left ulna, sequela: Secondary | ICD-10-CM | POA: Insufficient documentation

## 2020-07-22 DIAGNOSIS — R296 Repeated falls: Secondary | ICD-10-CM | POA: Diagnosis not present

## 2020-07-22 DIAGNOSIS — R4689 Other symptoms and signs involving appearance and behavior: Secondary | ICD-10-CM | POA: Diagnosis not present

## 2020-07-22 DIAGNOSIS — M25622 Stiffness of left elbow, not elsewhere classified: Secondary | ICD-10-CM | POA: Diagnosis not present

## 2020-07-22 DIAGNOSIS — R2689 Other abnormalities of gait and mobility: Secondary | ICD-10-CM | POA: Insufficient documentation

## 2020-07-22 DIAGNOSIS — R41844 Frontal lobe and executive function deficit: Secondary | ICD-10-CM | POA: Diagnosis not present

## 2020-07-22 HISTORY — DX: Monteggia's fracture of unspecified ulna, sequela: S52.279S
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
## 2020-07-22 NOTE — Patient Instructions (Signed)
DECREASE DEPAKOTE TO 500MG  THREE X DAILY

## 2020-07-24 ENCOUNTER — Other Ambulatory Visit: Payer: Self-pay

## 2020-07-24 ENCOUNTER — Ambulatory Visit: Payer: Medicaid Other | Attending: Physical Medicine & Rehabilitation

## 2020-07-24 DIAGNOSIS — R262 Difficulty in walking, not elsewhere classified: Secondary | ICD-10-CM | POA: Insufficient documentation

## 2020-07-24 DIAGNOSIS — M6281 Muscle weakness (generalized): Secondary | ICD-10-CM | POA: Insufficient documentation

## 2020-07-24 DIAGNOSIS — R269 Unspecified abnormalities of gait and mobility: Secondary | ICD-10-CM | POA: Insufficient documentation

## 2020-07-24 DIAGNOSIS — T148XXA Other injury of unspecified body region, initial encounter: Secondary | ICD-10-CM | POA: Diagnosis not present

## 2020-07-24 DIAGNOSIS — M25622 Stiffness of left elbow, not elsewhere classified: Secondary | ICD-10-CM | POA: Diagnosis not present

## 2020-07-24 DIAGNOSIS — R278 Other lack of coordination: Secondary | ICD-10-CM | POA: Insufficient documentation
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
## 2020-08-03 ENCOUNTER — Ambulatory Visit: Payer: Medicaid Other

## 2020-08-03 ENCOUNTER — Other Ambulatory Visit: Payer: Self-pay

## 2020-08-03 DIAGNOSIS — R269 Unspecified abnormalities of gait and mobility: Secondary | ICD-10-CM

## 2020-08-03 DIAGNOSIS — M6281 Muscle weakness (generalized): Secondary | ICD-10-CM

## 2020-08-03 DIAGNOSIS — R262 Difficulty in walking, not elsewhere classified: Secondary | ICD-10-CM

## 2020-08-03 DIAGNOSIS — R278 Other lack of coordination: Secondary | ICD-10-CM

## 2020-08-03 DIAGNOSIS — M25622 Stiffness of left elbow, not elsewhere classified: Secondary | ICD-10-CM | POA: Diagnosis not present

## 2020-08-03 DIAGNOSIS — T148XXA Other injury of unspecified body region, initial encounter: Secondary | ICD-10-CM | POA: Diagnosis not present
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
## 2020-08-05 ENCOUNTER — Ambulatory Visit: Payer: Medicaid Other

## 2020-08-10 ENCOUNTER — Ambulatory Visit: Payer: Medicaid Other

## 2020-08-10 ENCOUNTER — Other Ambulatory Visit: Payer: Self-pay

## 2020-08-10 DIAGNOSIS — R269 Unspecified abnormalities of gait and mobility: Secondary | ICD-10-CM | POA: Diagnosis not present

## 2020-08-10 DIAGNOSIS — T148XXA Other injury of unspecified body region, initial encounter: Secondary | ICD-10-CM

## 2020-08-10 DIAGNOSIS — R278 Other lack of coordination: Secondary | ICD-10-CM

## 2020-08-10 DIAGNOSIS — M25622 Stiffness of left elbow, not elsewhere classified: Secondary | ICD-10-CM | POA: Diagnosis not present

## 2020-08-10 DIAGNOSIS — R262 Difficulty in walking, not elsewhere classified: Secondary | ICD-10-CM

## 2020-08-10 DIAGNOSIS — M6281 Muscle weakness (generalized): Secondary | ICD-10-CM
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
## 2020-08-11 NOTE — Addendum Note (Signed)
Addended by: Otis Dials on: 08/11/2020 02:28 PM   Modules accepted: Orders

## 2020-08-12 ENCOUNTER — Encounter: Payer: Medicaid Other | Admitting: Occupational Therapy

## 2020-08-12 ENCOUNTER — Ambulatory Visit: Payer: Medicaid Other

## 2020-08-13 ENCOUNTER — Ambulatory Visit: Payer: Medicaid Other

## 2020-08-13 ENCOUNTER — Other Ambulatory Visit: Payer: Self-pay

## 2020-08-13 DIAGNOSIS — T148XXA Other injury of unspecified body region, initial encounter: Secondary | ICD-10-CM | POA: Diagnosis not present

## 2020-08-13 DIAGNOSIS — R262 Difficulty in walking, not elsewhere classified: Secondary | ICD-10-CM

## 2020-08-13 DIAGNOSIS — M6281 Muscle weakness (generalized): Secondary | ICD-10-CM | POA: Diagnosis not present

## 2020-08-13 DIAGNOSIS — R278 Other lack of coordination: Secondary | ICD-10-CM | POA: Diagnosis not present

## 2020-08-13 DIAGNOSIS — R269 Unspecified abnormalities of gait and mobility: Secondary | ICD-10-CM

## 2020-08-13 DIAGNOSIS — M25622 Stiffness of left elbow, not elsewhere classified: Secondary | ICD-10-CM
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
## 2020-08-17 ENCOUNTER — Ambulatory Visit: Payer: Medicaid Other

## 2020-08-19 ENCOUNTER — Ambulatory Visit: Payer: Medicaid Other

## 2020-08-21 DIAGNOSIS — Z419 Encounter for procedure for purposes other than remedying health state, unspecified: Secondary | ICD-10-CM | POA: Diagnosis not present

## 2020-08-26 ENCOUNTER — Ambulatory Visit: Payer: Medicaid Other

## 2020-08-26 ENCOUNTER — Ambulatory Visit: Payer: Medicaid Other | Attending: Physical Medicine & Rehabilitation

## 2020-08-26 DIAGNOSIS — R2681 Unsteadiness on feet: Secondary | ICD-10-CM | POA: Insufficient documentation

## 2020-08-26 DIAGNOSIS — T148XXA Other injury of unspecified body region, initial encounter: Secondary | ICD-10-CM | POA: Insufficient documentation

## 2020-08-26 DIAGNOSIS — R269 Unspecified abnormalities of gait and mobility: Secondary | ICD-10-CM | POA: Insufficient documentation

## 2020-08-26 DIAGNOSIS — R278 Other lack of coordination: Secondary | ICD-10-CM | POA: Insufficient documentation

## 2020-08-26 DIAGNOSIS — M25622 Stiffness of left elbow, not elsewhere classified: Secondary | ICD-10-CM | POA: Insufficient documentation

## 2020-08-26 DIAGNOSIS — T1490XA Injury, unspecified, initial encounter: Secondary | ICD-10-CM | POA: Insufficient documentation

## 2020-08-26 DIAGNOSIS — M6281 Muscle weakness (generalized): Secondary | ICD-10-CM | POA: Insufficient documentation

## 2020-08-26 DIAGNOSIS — R262 Difficulty in walking, not elsewhere classified: Secondary | ICD-10-CM | POA: Insufficient documentation

## 2020-08-28 ENCOUNTER — Other Ambulatory Visit: Payer: Self-pay

## 2020-08-28 ENCOUNTER — Ambulatory Visit: Payer: Medicaid Other | Admitting: Physical Therapy

## 2020-08-28 DIAGNOSIS — R278 Other lack of coordination: Secondary | ICD-10-CM

## 2020-08-28 DIAGNOSIS — R262 Difficulty in walking, not elsewhere classified: Secondary | ICD-10-CM | POA: Diagnosis not present

## 2020-08-28 DIAGNOSIS — R2681 Unsteadiness on feet: Secondary | ICD-10-CM | POA: Diagnosis not present

## 2020-08-28 DIAGNOSIS — M6281 Muscle weakness (generalized): Secondary | ICD-10-CM | POA: Diagnosis not present

## 2020-08-28 DIAGNOSIS — T148XXA Other injury of unspecified body region, initial encounter: Secondary | ICD-10-CM | POA: Diagnosis not present

## 2020-08-28 DIAGNOSIS — R269 Unspecified abnormalities of gait and mobility: Secondary | ICD-10-CM | POA: Diagnosis not present

## 2020-08-28 DIAGNOSIS — M25622 Stiffness of left elbow, not elsewhere classified: Secondary | ICD-10-CM | POA: Diagnosis not present

## 2020-08-28 DIAGNOSIS — T1490XA Injury, unspecified, initial encounter: Secondary | ICD-10-CM | POA: Diagnosis not present
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
## 2020-08-31 ENCOUNTER — Ambulatory Visit: Payer: Medicaid Other | Admitting: Physical Therapy

## 2020-08-31 ENCOUNTER — Ambulatory Visit: Payer: Medicaid Other

## 2020-08-31 ENCOUNTER — Other Ambulatory Visit: Payer: Self-pay

## 2020-08-31 DIAGNOSIS — R2681 Unsteadiness on feet: Secondary | ICD-10-CM | POA: Diagnosis not present

## 2020-08-31 DIAGNOSIS — R269 Unspecified abnormalities of gait and mobility: Secondary | ICD-10-CM | POA: Diagnosis not present

## 2020-08-31 DIAGNOSIS — R278 Other lack of coordination: Secondary | ICD-10-CM | POA: Diagnosis not present

## 2020-08-31 DIAGNOSIS — M25622 Stiffness of left elbow, not elsewhere classified: Secondary | ICD-10-CM

## 2020-08-31 DIAGNOSIS — M6281 Muscle weakness (generalized): Secondary | ICD-10-CM

## 2020-08-31 DIAGNOSIS — T1490XA Injury, unspecified, initial encounter: Secondary | ICD-10-CM

## 2020-08-31 DIAGNOSIS — T148XXA Other injury of unspecified body region, initial encounter: Secondary | ICD-10-CM

## 2020-08-31 DIAGNOSIS — R262 Difficulty in walking, not elsewhere classified: Secondary | ICD-10-CM
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
## 2020-09-02 ENCOUNTER — Ambulatory Visit: Payer: Medicaid Other | Admitting: Occupational Therapy

## 2020-09-02 ENCOUNTER — Ambulatory Visit: Payer: Medicaid Other

## 2020-09-07 ENCOUNTER — Ambulatory Visit: Payer: Medicaid Other

## 2020-09-09 ENCOUNTER — Ambulatory Visit: Payer: Medicaid Other

## 2020-09-14 ENCOUNTER — Other Ambulatory Visit: Payer: Self-pay

## 2020-09-14 ENCOUNTER — Ambulatory Visit: Payer: Medicaid Other | Admitting: Occupational Therapy

## 2020-09-14 ENCOUNTER — Encounter: Payer: Self-pay | Admitting: Occupational Therapy

## 2020-09-14 ENCOUNTER — Ambulatory Visit: Payer: Medicaid Other

## 2020-09-14 DIAGNOSIS — R2681 Unsteadiness on feet: Secondary | ICD-10-CM

## 2020-09-14 DIAGNOSIS — R278 Other lack of coordination: Secondary | ICD-10-CM | POA: Diagnosis not present

## 2020-09-14 DIAGNOSIS — R269 Unspecified abnormalities of gait and mobility: Secondary | ICD-10-CM | POA: Diagnosis not present

## 2020-09-14 DIAGNOSIS — M25622 Stiffness of left elbow, not elsewhere classified: Secondary | ICD-10-CM | POA: Diagnosis not present

## 2020-09-14 DIAGNOSIS — M6281 Muscle weakness (generalized): Secondary | ICD-10-CM | POA: Diagnosis not present

## 2020-09-14 DIAGNOSIS — T1490XA Injury, unspecified, initial encounter: Secondary | ICD-10-CM | POA: Diagnosis not present

## 2020-09-14 DIAGNOSIS — R262 Difficulty in walking, not elsewhere classified: Secondary | ICD-10-CM | POA: Diagnosis not present

## 2020-09-14 DIAGNOSIS — T148XXA Other injury of unspecified body region, initial encounter: Secondary | ICD-10-CM | POA: Diagnosis not present
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
## 2020-09-16 ENCOUNTER — Ambulatory Visit: Payer: Medicaid Other | Admitting: Occupational Therapy

## 2020-09-16 ENCOUNTER — Ambulatory Visit: Payer: Medicaid Other

## 2020-09-17 ENCOUNTER — Ambulatory Visit: Payer: Medicaid Other | Admitting: Occupational Therapy

## 2020-09-17 ENCOUNTER — Ambulatory Visit: Payer: Medicaid Other

## 2020-09-21 ENCOUNTER — Ambulatory Visit: Payer: Medicaid Other | Attending: Physical Medicine & Rehabilitation

## 2020-09-21 ENCOUNTER — Ambulatory Visit: Payer: Medicaid Other | Admitting: Occupational Therapy

## 2020-09-21 ENCOUNTER — Other Ambulatory Visit: Payer: Self-pay

## 2020-09-21 DIAGNOSIS — M6281 Muscle weakness (generalized): Secondary | ICD-10-CM | POA: Insufficient documentation

## 2020-09-21 DIAGNOSIS — R269 Unspecified abnormalities of gait and mobility: Secondary | ICD-10-CM | POA: Diagnosis not present

## 2020-09-21 DIAGNOSIS — R2681 Unsteadiness on feet: Secondary | ICD-10-CM

## 2020-09-21 DIAGNOSIS — R262 Difficulty in walking, not elsewhere classified: Secondary | ICD-10-CM | POA: Insufficient documentation

## 2020-09-21 DIAGNOSIS — R278 Other lack of coordination: Secondary | ICD-10-CM | POA: Diagnosis not present
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
## 2020-09-21 NOTE — Therapy (Signed)
[facility] MAIN [facility] SERVICES [address] [place], [place], [address] Phone: [phone]   Fax:  [phone]  Patient Details  Name: Jeremy George. MRN: [id number] Date of Birth: [birth year] Referring Provider:  Ranelle Oyster, MD  Encounter Date: 09/21/2020  Pt. arrived for PT session, however had to leave prior to OT secondary to a conflicting MD appointment time.    Olegario Messier, MS, OTR/L 09/21/2020, 6:01 PM  [facility] MAIN [facility] SERVICES [address] [place], [place], [address] Phone: [phone]   Fax:  [phone]

## 2020-09-22 DIAGNOSIS — S52272D Monteggia's fracture of left ulna, subsequent encounter for closed fracture with routine healing: Secondary | ICD-10-CM | POA: Diagnosis not present

## 2020-09-22 DIAGNOSIS — S42412D Displaced simple supracondylar fracture without intercondylar fracture of left humerus, subsequent encounter for fracture with routine healing: Secondary | ICD-10-CM | POA: Diagnosis not present

## 2020-09-22 DIAGNOSIS — S32422D Displaced fracture of posterior wall of left acetabulum, subsequent encounter for fracture with routine healing: Secondary | ICD-10-CM | POA: Diagnosis not present

## 2020-09-22 DIAGNOSIS — S42001D Fracture of unspecified part of right clavicle, subsequent encounter for fracture with routine healing: Secondary | ICD-10-CM | POA: Diagnosis not present

## 2020-09-23 ENCOUNTER — Encounter: Payer: Self-pay | Admitting: Physical Medicine & Rehabilitation

## 2020-09-23 ENCOUNTER — Encounter: Payer: Medicaid Other | Attending: Registered Nurse | Admitting: Physical Medicine & Rehabilitation

## 2020-09-23 ENCOUNTER — Other Ambulatory Visit: Payer: Self-pay

## 2020-09-23 VITALS — BP 112/79 | HR 91 | Temp 98.0°F | Ht 75.0 in | Wt 149.4 lb

## 2020-09-23 DIAGNOSIS — S73005S Unspecified dislocation of left hip, sequela: Secondary | ICD-10-CM | POA: Diagnosis not present

## 2020-09-23 DIAGNOSIS — S32422A Displaced fracture of posterior wall of left acetabulum, initial encounter for closed fracture: Secondary | ICD-10-CM

## 2020-09-23 DIAGNOSIS — S062X9S Diffuse traumatic brain injury with loss of consciousness of unspecified duration, sequela: Secondary | ICD-10-CM | POA: Diagnosis not present

## 2020-09-23 DIAGNOSIS — G479 Sleep disorder, unspecified: Secondary | ICD-10-CM

## 2020-09-23 DIAGNOSIS — F329 Major depressive disorder, single episode, unspecified: Secondary | ICD-10-CM

## 2020-09-23 DIAGNOSIS — S52279S Monteggia's fracture of unspecified ulna, sequela: Secondary | ICD-10-CM

## 2020-09-23 MED ORDER — QUETIAPINE FUMARATE 50 MG PO TABS: 50.0000 mg | ORAL_TABLET | Freq: Every day | ORAL | 5 refills | Status: DC

## 2020-09-23 MED ORDER — CITALOPRAM HYDROBROMIDE 10 MG PO TABS: 20.0000 mg | ORAL_TABLET | Freq: Every day | ORAL | 5 refills | Status: DC

## 2020-09-23 MED ORDER — CITALOPRAM HYDROBROMIDE 20 MG PO TABS: 20.0000 mg | ORAL_TABLET | Freq: Every day | ORAL | 5 refills | Status: DC
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
## 2020-09-23 NOTE — Patient Instructions (Signed)
PAIN:  TYLENOL (ACETAMINOPHEN): TAKE UP 3000MG  PER DAY IN 3-4 DIVIDED DOSES   ANTI-INFLAMMATORIES: IBUPROFEN 200MG ---600MG  THREE X DAILY WITH FOOD  NAPROXEN 220MG  1-2  TABLETS 2 X DAILY WITH FOOD

## 2020-09-24 ENCOUNTER — Encounter: Payer: Self-pay | Admitting: Physical Therapy

## 2020-09-24 ENCOUNTER — Ambulatory Visit: Payer: Medicaid Other

## 2020-09-24 ENCOUNTER — Ambulatory Visit: Payer: Medicaid Other | Admitting: Physical Therapy

## 2020-09-24 DIAGNOSIS — M6281 Muscle weakness (generalized): Secondary | ICD-10-CM

## 2020-09-24 DIAGNOSIS — R2681 Unsteadiness on feet: Secondary | ICD-10-CM

## 2020-09-24 DIAGNOSIS — R278 Other lack of coordination: Secondary | ICD-10-CM

## 2020-09-24 DIAGNOSIS — R262 Difficulty in walking, not elsewhere classified: Secondary | ICD-10-CM

## 2020-09-24 DIAGNOSIS — R269 Unspecified abnormalities of gait and mobility: Secondary | ICD-10-CM
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
## 2020-09-28 ENCOUNTER — Ambulatory Visit: Payer: Medicaid Other

## 2020-09-28 ENCOUNTER — Ambulatory Visit: Payer: Medicaid Other | Admitting: Occupational Therapy

## 2020-09-30 ENCOUNTER — Ambulatory Visit: Payer: Medicaid Other

## 2020-09-30 ENCOUNTER — Ambulatory Visit: Payer: Medicaid Other | Admitting: Occupational Therapy

## 2020-10-05 ENCOUNTER — Ambulatory Visit: Payer: Medicaid Other

## 2020-10-05 ENCOUNTER — Encounter: Payer: Medicaid Other | Admitting: Occupational Therapy

## 2020-10-07 ENCOUNTER — Ambulatory Visit: Payer: Medicaid Other

## 2020-10-07 ENCOUNTER — Encounter: Payer: Medicaid Other | Admitting: Occupational Therapy

## 2020-10-12 ENCOUNTER — Encounter: Payer: Medicaid Other | Admitting: Occupational Therapy

## 2020-10-12 ENCOUNTER — Ambulatory Visit: Payer: Medicaid Other

## 2020-10-14 ENCOUNTER — Ambulatory Visit: Payer: Medicaid Other

## 2020-10-14 ENCOUNTER — Encounter: Payer: Medicaid Other | Admitting: Occupational Therapy

## 2020-10-19 ENCOUNTER — Ambulatory Visit: Payer: Medicaid Other

## 2020-10-19 ENCOUNTER — Encounter: Payer: Medicaid Other | Admitting: Occupational Therapy

## 2020-10-21 ENCOUNTER — Ambulatory Visit: Payer: Medicaid Other

## 2020-10-21 ENCOUNTER — Encounter: Payer: Medicaid Other | Admitting: Occupational Therapy

## 2020-12-09 ENCOUNTER — Other Ambulatory Visit: Payer: Self-pay

## 2020-12-09 ENCOUNTER — Ambulatory Visit (INDEPENDENT_AMBULATORY_CARE_PROVIDER_SITE_OTHER): Payer: Medicaid Other | Admitting: Plastic Surgery

## 2020-12-09 DIAGNOSIS — S0993XA Unspecified injury of face, initial encounter: Secondary | ICD-10-CM | POA: Diagnosis not present
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
## 2020-12-23 ENCOUNTER — Encounter: Payer: Medicaid Other | Attending: Registered Nurse | Admitting: Physical Medicine & Rehabilitation

## 2020-12-23 DIAGNOSIS — S32422A Displaced fracture of posterior wall of left acetabulum, initial encounter for closed fracture: Secondary | ICD-10-CM | POA: Insufficient documentation

## 2020-12-23 DIAGNOSIS — S52279S Monteggia's fracture of unspecified ulna, sequela: Secondary | ICD-10-CM | POA: Insufficient documentation

## 2020-12-23 DIAGNOSIS — G479 Sleep disorder, unspecified: Secondary | ICD-10-CM | POA: Insufficient documentation

## 2020-12-23 DIAGNOSIS — F329 Major depressive disorder, single episode, unspecified: Secondary | ICD-10-CM | POA: Insufficient documentation

## 2020-12-23 DIAGNOSIS — S062X9S Diffuse traumatic brain injury with loss of consciousness of unspecified duration, sequela: Secondary | ICD-10-CM | POA: Insufficient documentation

## 2020-12-23 DIAGNOSIS — S73005S Unspecified dislocation of left hip, sequela: Secondary | ICD-10-CM | POA: Insufficient documentation

## 2021-01-21 ENCOUNTER — Telehealth: Payer: Self-pay | Admitting: Physical Medicine & Rehabilitation

## 2021-01-21 NOTE — Telephone Encounter (Signed)
Ptyn called and left voicemail - needs ZS to fill out disability forms [phone] voicemail box not set up-- if he returns called he needs to fax and also set up appt with MD

## 2021-01-27 ENCOUNTER — Encounter: Payer: Self-pay | Admitting: Physical Medicine & Rehabilitation

## 2021-01-27 ENCOUNTER — Other Ambulatory Visit: Payer: Self-pay

## 2021-01-27 ENCOUNTER — Encounter: Payer: Medicaid Other | Attending: Physical Medicine & Rehabilitation | Admitting: Physical Medicine & Rehabilitation

## 2021-01-27 VITALS — BP 113/78 | HR 93 | Temp 98.3°F | Ht 75.0 in | Wt 146.0 lb

## 2021-01-27 DIAGNOSIS — M549 Dorsalgia, unspecified: Secondary | ICD-10-CM | POA: Diagnosis not present

## 2021-01-27 DIAGNOSIS — R41841 Cognitive communication deficit: Secondary | ICD-10-CM | POA: Diagnosis not present

## 2021-01-27 DIAGNOSIS — S0689AS Other specified intracranial injury with loss of consciousness status unknown, sequela: Secondary | ICD-10-CM | POA: Insufficient documentation

## 2021-01-27 DIAGNOSIS — R4189 Other symptoms and signs involving cognitive functions and awareness: Secondary | ICD-10-CM | POA: Insufficient documentation

## 2021-01-27 DIAGNOSIS — R269 Unspecified abnormalities of gait and mobility: Secondary | ICD-10-CM | POA: Diagnosis not present

## 2021-01-27 DIAGNOSIS — S069XAS Unspecified intracranial injury with loss of consciousness status unknown, sequela: Secondary | ICD-10-CM | POA: Diagnosis not present

## 2021-01-27 DIAGNOSIS — F419 Anxiety disorder, unspecified: Secondary | ICD-10-CM | POA: Diagnosis not present

## 2021-01-27 DIAGNOSIS — S069X0S Unspecified intracranial injury without loss of consciousness, sequela: Secondary | ICD-10-CM | POA: Diagnosis not present

## 2021-01-27 NOTE — Patient Instructions (Signed)
PLEASE FEEL FREE TO CALL OUR OFFICE WITH ANY PROBLEMS OR QUESTIONS ([phone]     IMPROVE YOUR SLEEP HABITS!!!
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
## 2021-02-11 ENCOUNTER — Emergency Department (HOSPITAL_COMMUNITY): Payer: Medicaid Other

## 2021-02-11 ENCOUNTER — Emergency Department (HOSPITAL_COMMUNITY)
Admission: EM | Admit: 2021-02-11 | Discharge: 2021-02-12 | Disposition: A | Discharge status: Home / Self Care | Payer: Medicaid Other | Attending: Emergency Medicine | Admitting: Emergency Medicine

## 2021-02-11 ENCOUNTER — Other Ambulatory Visit: Payer: Self-pay

## 2021-02-11 DIAGNOSIS — L089 Local infection of the skin and subcutaneous tissue, unspecified: Secondary | ICD-10-CM

## 2021-02-11 DIAGNOSIS — T8149XA Infection following a procedure, other surgical site, initial encounter: Secondary | ICD-10-CM | POA: Insufficient documentation

## 2021-02-11 DIAGNOSIS — F17210 Nicotine dependence, cigarettes, uncomplicated: Secondary | ICD-10-CM | POA: Diagnosis not present

## 2021-02-11 DIAGNOSIS — T8141XA Infection following a procedure, superficial incisional surgical site, initial encounter: Secondary | ICD-10-CM | POA: Diagnosis not present

## 2021-02-11 DIAGNOSIS — L02414 Cutaneous abscess of left upper limb: Secondary | ICD-10-CM | POA: Insufficient documentation

## 2021-02-11 DIAGNOSIS — M7989 Other specified soft tissue disorders: Secondary | ICD-10-CM | POA: Diagnosis not present

## 2021-02-11 DIAGNOSIS — M79602 Pain in left arm: Secondary | ICD-10-CM | POA: Diagnosis present

## 2021-02-11 LAB — BASIC METABOLIC PANEL
Anion gap: 9 (ref 5–15)
BUN: 5 mg/dL — ABNORMAL LOW (ref 6–20)
CO2: 26 mmol/L (ref 22–32)
Calcium: 9 mg/dL (ref 8.9–10.3)
Chloride: 103 mmol/L (ref 98–111)
Creatinine, Ser: 0.75 mg/dL (ref 0.61–1.24)
GFR, Estimated: >60 mL/min (ref >60)
Glucose, Bld: 93 mg/dL (ref 70–99)
Potassium: 4.5 mmol/L (ref 3.5–5.1)
Sodium: 138 mmol/L (ref 135–145)

## 2021-02-11 LAB — CBC WITH DIFFERENTIAL/PLATELET
Abs Immature Granulocytes: 0.01 10*3/uL (ref 0.00–0.07)
Basophils Absolute: 0.1 10*3/uL (ref 0.0–0.1)
Basophils Relative: 1 %
Eosinophils Absolute: 0.1 10*3/uL (ref 0.0–0.5)
Eosinophils Relative: 2 %
HCT: 47.1 % (ref 39.0–52.0)
Hemoglobin: 15.4 g/dL (ref 13.0–17.0)
Immature Granulocytes: 0 %
Lymphocytes Relative: 38 %
Lymphs Abs: 2.7 10*3/uL (ref 0.7–4.0)
MCH: 27.7 pg (ref 26.0–34.0)
MCHC: 32.7 g/dL (ref 30.0–36.0)
MCV: 84.7 fL (ref 80.0–100.0)
Monocytes Absolute: 0.6 10*3/uL (ref 0.1–1.0)
Monocytes Relative: 8 %
Neutro Abs: 3.7 10*3/uL (ref 1.7–7.7)
Neutrophils Relative %: 51 %
Platelets: 425 10*3/uL — ABNORMAL HIGH (ref 150–400)
RBC: 5.56 MIL/uL (ref 4.22–5.81)
RDW: 14.9 % (ref 11.5–15.5)
WBC: 7.1 10*3/uL (ref 4.0–10.5)
nRBC: 0 % (ref 0.0–0.2)

## 2021-02-11 LAB — LACTIC ACID, PLASMA: Lactic Acid, Venous: 1.4 mmol/L (ref 0.5–1.9)
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
## 2021-02-12 ENCOUNTER — Other Ambulatory Visit: Payer: Self-pay

## 2021-02-12 ENCOUNTER — Emergency Department (HOSPITAL_COMMUNITY)
Admission: EM | Admit: 2021-02-12 | Discharge: 2021-02-13 | Disposition: A | Discharge status: Home / Self Care | Payer: Medicaid Other | Source: Home / Self Care | Attending: Emergency Medicine | Admitting: Emergency Medicine

## 2021-02-12 ENCOUNTER — Emergency Department (HOSPITAL_COMMUNITY): Payer: Medicaid Other

## 2021-02-12 DIAGNOSIS — T8149XA Infection following a procedure, other surgical site, initial encounter: Secondary | ICD-10-CM

## 2021-02-12 DIAGNOSIS — T8141XA Infection following a procedure, superficial incisional surgical site, initial encounter: Secondary | ICD-10-CM | POA: Diagnosis not present

## 2021-02-12 DIAGNOSIS — F17210 Nicotine dependence, cigarettes, uncomplicated: Secondary | ICD-10-CM | POA: Insufficient documentation

## 2021-02-12 DIAGNOSIS — L02414 Cutaneous abscess of left upper limb: Secondary | ICD-10-CM | POA: Diagnosis not present

## 2021-02-12 LAB — CBC WITH DIFFERENTIAL/PLATELET
Abs Immature Granulocytes: 0.03 10*3/uL (ref 0.00–0.07)
Basophils Absolute: 0 10*3/uL (ref 0.0–0.1)
Basophils Relative: 1 %
Eosinophils Absolute: 0.1 10*3/uL (ref 0.0–0.5)
Eosinophils Relative: 2 %
HCT: 47.2 % (ref 39.0–52.0)
Hemoglobin: 15.4 g/dL (ref 13.0–17.0)
Immature Granulocytes: 0 %
Lymphocytes Relative: 32 %
Lymphs Abs: 2.4 10*3/uL (ref 0.7–4.0)
MCH: 27.7 pg (ref 26.0–34.0)
MCHC: 32.6 g/dL (ref 30.0–36.0)
MCV: 84.9 fL (ref 80.0–100.0)
Monocytes Absolute: 0.6 10*3/uL (ref 0.1–1.0)
Monocytes Relative: 8 %
Neutro Abs: 4.5 10*3/uL (ref 1.7–7.7)
Neutrophils Relative %: 57 %
Platelets: 413 10*3/uL — ABNORMAL HIGH (ref 150–400)
RBC: 5.56 MIL/uL (ref 4.22–5.81)
RDW: 14.9 % (ref 11.5–15.5)
WBC: 7.7 10*3/uL (ref 4.0–10.5)
nRBC: 0 % (ref 0.0–0.2)

## 2021-02-12 LAB — BASIC METABOLIC PANEL
Anion gap: 5 (ref 5–15)
BUN: 9 mg/dL (ref 6–20)
CO2: 25 mmol/L (ref 22–32)
Calcium: 8.7 mg/dL — ABNORMAL LOW (ref 8.9–10.3)
Chloride: 107 mmol/L (ref 98–111)
Creatinine, Ser: 0.77 mg/dL (ref 0.61–1.24)
GFR, Estimated: >60 mL/min (ref >60)
Glucose, Bld: 97 mg/dL (ref 70–99)
Potassium: 3.9 mmol/L (ref 3.5–5.1)
Sodium: 137 mmol/L (ref 135–145)

## 2021-02-12 LAB — C-REACTIVE PROTEIN: CRP: 2.8 mg/dL — ABNORMAL HIGH (ref <1.0)

## 2021-02-12 LAB — SEDIMENTATION RATE: Sed Rate: 14 mm/hr (ref 0–16)

## 2021-02-12 MED ORDER — CEFAZOLIN SODIUM-DEXTROSE 2-4 GM/100ML-% IV SOLN
2.0000 g | Freq: Once | INTRAVENOUS | Status: AC
  Administered 2021-02-12: 01:00:00 2 g via INTRAVENOUS
  Filled 2021-02-12: qty 100

## 2021-02-12 NOTE — Discharge Instructions (Addendum)
You are to follow-up tomorrow with Dr. [name] in the orthopedic clinic.  His office should call you tomorrow morning to give you an exact time.  If you have not heard from them by 10 AM, call to ensure these arrangements are made.  His contact information has been provided in this discharge summary.

## 2021-02-12 NOTE — ED Triage Notes (Signed)
Pt here for abscess on L elbow, pt reports some pain to the area. Was given antibiotics by haddix MD, was told to follow up w/ them but office is closed due to holidays.

## 2021-02-12 NOTE — ED Provider Notes (Signed)
Emergency Medicine Provider Triage Evaluation Note  Jeremy George. , a 25 y.o. male  was evaluated in triage.  Pt complains of left arm abscess -- had a complicated fracture with hardware of the left arm. Patient of dr. [name]. Was seen yesterday, received IV abx, and was told to follow up with Dr. [name] -- office closed until the 27th. Drainage from abscess.  Review of Systems  Positive: Abscess, arm pain Negative: Fever, chills  Physical Exam  BP (!) 124/93 (BP Location: Right Arm)    Pulse 92    Temp 99.6 F (37.6 C) (Oral)    Resp 19    SpO2 100%  Gen:   Awake, no distress   Resp:  Normal effort  MSK:   Moves extremities without difficulty  Other:  Abscess noted left upper arm -- see Dr. [name] note for exact locatio n  Medical Decision Making  Medically screening exam initiated at 9:18 PM.  Appropriate orders placed.  Jeremy George. was informed that the remainder of the evaluation will be completed by another provider, this initial triage assessment does not replace that evaluation, and the importance of remaining in the ED until their evaluation is complete.  Abscess spontaneously drained on eval, surround cellulitis noted   [place] 02/12/21 2120    Eber Hong, MD 02/12/21 2302

## 2021-02-13 MED ORDER — BACITRACIN ZINC 500 UNIT/GM EX OINT
TOPICAL_OINTMENT | Freq: Once | CUTANEOUS | Status: AC
  Filled 2021-02-13: qty 0.9

## 2021-02-13 MED ORDER — CEPHALEXIN 500 MG PO CAPS: 500.0000 mg | ORAL_CAPSULE | Freq: Four times a day (QID) | ORAL | 0 refills | Status: DC

## 2021-02-13 NOTE — ED Notes (Signed)
Xeroform, Bacitracin, and bandaged

## 2021-02-13 NOTE — Discharge Instructions (Signed)
You are seen today with ongoing concerns for wound infection.  Your work-up is overall reassuring.  Take antibiotic as prescribed.  You will receive a phone call this morning from the orthopedist on-call regarding plan of care.  Until that time, make sure you do not eat anything.  Apply bacitracin ointment and wet-to-dry dressing to the wound.  If you develop fevers, redness or any new or worsening symptoms, you should be reevaluated.
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
## 2021-02-16 DIAGNOSIS — S42412D Displaced simple supracondylar fracture without intercondylar fracture of left humerus, subsequent encounter for fracture with routine healing: Secondary | ICD-10-CM | POA: Diagnosis not present

## 2021-02-16 DIAGNOSIS — S52272D Monteggia's fracture of left ulna, subsequent encounter for closed fracture with routine healing: Secondary | ICD-10-CM | POA: Diagnosis not present

## 2021-02-16 DIAGNOSIS — T8149XA Infection following a procedure, other surgical site, initial encounter: Secondary | ICD-10-CM | POA: Diagnosis not present

## 2021-02-18 LAB — AEROBIC/ANAEROBIC CULTURE W GRAM STAIN (SURGICAL/DEEP WOUND)

## 2021-02-20 ENCOUNTER — Telehealth: Payer: Self-pay | Admitting: Emergency Medicine

## 2021-02-20 NOTE — Telephone Encounter (Signed)
Post ED Visit - Positive Culture Follow-up  Culture report reviewed by antimicrobial stewardship pharmacist: [facility] []  , Pharm.D. []  Enzo Bi, Pharm.D., BCPS AQ-ID []  , Pharm.D., BCPS []  Celedonio Miyamoto, Pharm.D., BCPS []  [place], Garvin Fila.D., BCPS, AAHIVP []  , Pharm.D., BCPS, AAHIVP []  Georgina Pillion, PharmD, BCPS []  , PharmD, BCPS []  [place], PharmD, BCPS [x]  [address], PharmD []  , PharmD, BCPS []  Estella Husk, PharmD  Pharmacy Team []  Lysle Pearl, PharmD []  , PharmD []  Phillips Climes, PharmD []  , Rph []  Agapito Games) , PharmD []  Daylene Posey, PharmD []  , PharmD []  Mervyn Gay, PharmD []  , PharmD []  Vinnie Level, PharmD []  [facility], PharmD []  , PharmD []  Len Childs, PharmD   Positive aerobic/anaerobic culture Treated with Cephalexin, organism sensitive to the same and no further patient follow-up is required at this time.  Jeremy George 02/20/2021, 10:47 AM

## 2021-02-23 ENCOUNTER — Ambulatory Visit: Payer: Self-pay | Admitting: Student

## 2021-02-23 DIAGNOSIS — M79602 Pain in left arm: Secondary | ICD-10-CM | POA: Diagnosis not present
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
## 2021-02-24 ENCOUNTER — Encounter (HOSPITAL_COMMUNITY): Payer: Self-pay | Admitting: Student

## 2021-02-24 ENCOUNTER — Other Ambulatory Visit: Payer: Self-pay

## 2021-02-24 NOTE — Progress Notes (Signed)
PCP - Denies Cardiologist - Denies  PPM/ICD - Denies   Chest x-ray - 04/26/20 EKG - 04/27/20 Stress Test - Denies ECHO - Denies Cardiac Cath - Denies  CPAP - Denies  ERAS Protcol - Clears until 0430  COVID TEST- N/A Ambulatory  Anesthesia review: N  Patient verbally denies any shortness of breath, fever, cough and chest pain during phone call   -------------  SDW INSTRUCTIONS given:  Your procedure is scheduled on 02/26/21.  Report to [facility] Main Entrance "A" at 0530 A.M., and check in at the Admitting office.  Call this number if you have problems the morning of surgery:  [phone]   Remember:  Do not eat after midnight the night before your surgery  You may drink clear liquids until 0430 the morning of your surgery.   Clear liquids allowed are: Water, Non-Citrus Juices (without pulp), Carbonated Beverages, Clear Tea, Black Coffee Only, and Gatorade    Take these medicines the morning of surgery with A SIP OF WATER:  Tylenol  As of today, STOP taking any Aspirin (unless otherwise instructed by your surgeon) Aleve, Naproxen, Ibuprofen, Motrin, Advil, Goody's, BC's, all herbal medications, fish oil, and all vitamins.                      Do not wear jewelry, make up, or nail polish            Do not wear lotions, powders, perfumes/colognes, or deodorant.            Do not shave 48 hours prior to surgery.  Men may shave face and neck.            Do not bring valuables to the hospital.            [facility] is not responsible for any belongings or valuables.  Do NOT Smoke (Tobacco/Vaping) or drink Alcohol 24 hours prior to your procedure If you use a CPAP at night, you may bring all equipment for your overnight stay.   Contacts, glasses, dentures or bridgework may not be worn into surgery.      For patients admitted to the hospital, discharge time will be determined by your treatment team.   Patients discharged the day of surgery will not be allowed to drive  home, and someone needs to stay with them for 24 hours.    Special instructions:   [facility]- Preparing For Surgery  Before surgery, you can play an important role. Because skin is not sterile, your skin needs to be as free of germs as possible. You can reduce the number of germs on your skin by washing with CHG (chlorahexidine gluconate) Soap before surgery.  CHG is an antiseptic cleaner which kills germs and bonds with the skin to continue killing germs even after washing.    Oral Hygiene is also important to reduce your risk of infection.  Remember - BRUSH YOUR TEETH THE MORNING OF SURGERY WITH YOUR REGULAR TOOTHPASTE  Please do not use if you have an allergy to CHG or antibacterial soaps. If your skin becomes reddened/irritated stop using the CHG.  Do not shave (including legs and underarms) for at least 48 hours prior to first CHG shower. It is OK to shave your face.  Please follow these instructions carefully.   Shower the NIGHT BEFORE SURGERY and the MORNING OF SURGERY with DIAL Soap.   Pat yourself dry with a CLEAN TOWEL.  Wear CLEAN PAJAMAS to bed the night  before surgery  Place CLEAN SHEETS on your bed the night of your first shower and DO NOT SLEEP WITH PETS.   Day of Surgery: Please shower morning of surgery  Wear Clean/Comfortable clothing the morning of surgery Do not apply any deodorants/lotions.   Remember to brush your teeth WITH YOUR REGULAR TOOTHPASTE.   Questions were answered. Patient verbalized understanding of instructions.

## 2021-02-26 ENCOUNTER — Encounter (HOSPITAL_COMMUNITY): Payer: Self-pay | Admitting: Student

## 2021-02-26 ENCOUNTER — Ambulatory Visit (HOSPITAL_COMMUNITY): Payer: Medicaid Other | Admitting: Certified Registered Nurse Anesthetist

## 2021-02-26 ENCOUNTER — Other Ambulatory Visit: Payer: Self-pay

## 2021-02-26 ENCOUNTER — Ambulatory Visit (HOSPITAL_COMMUNITY)
Admission: RE | Admit: 2021-02-26 | Discharge: 2021-02-26 | Disposition: A | Discharge status: Home / Self Care | Payer: Medicaid Other | Attending: Student | Admitting: Student

## 2021-02-26 ENCOUNTER — Ambulatory Visit (HOSPITAL_COMMUNITY): Payer: Medicaid Other

## 2021-02-26 ENCOUNTER — Encounter (HOSPITAL_COMMUNITY)
Admission: RE | Disposition: A | Discharge status: Home / Self Care | Payer: Self-pay | Source: Home / Self Care | Attending: Student

## 2021-02-26 DIAGNOSIS — T84611A Infection and inflammatory reaction due to internal fixation device of left humerus, initial encounter: Secondary | ICD-10-CM | POA: Diagnosis not present

## 2021-02-26 DIAGNOSIS — T8149XA Infection following a procedure, other surgical site, initial encounter: Secondary | ICD-10-CM | POA: Diagnosis not present

## 2021-02-26 DIAGNOSIS — F17210 Nicotine dependence, cigarettes, uncomplicated: Secondary | ICD-10-CM | POA: Diagnosis not present

## 2021-02-26 DIAGNOSIS — Z419 Encounter for procedure for purposes other than remedying health state, unspecified: Secondary | ICD-10-CM

## 2021-02-26 DIAGNOSIS — T8140XA Infection following a procedure, unspecified, initial encounter: Secondary | ICD-10-CM | POA: Diagnosis present

## 2021-02-26 DIAGNOSIS — Y838 Other surgical procedures as the cause of abnormal reaction of the patient, or of later complication, without mention of misadventure at the time of the procedure: Secondary | ICD-10-CM | POA: Insufficient documentation

## 2021-02-26 DIAGNOSIS — S42412B Displaced simple supracondylar fracture without intercondylar fracture of left humerus, initial encounter for open fracture: Secondary | ICD-10-CM | POA: Diagnosis not present

## 2021-02-26 DIAGNOSIS — Z0389 Encounter for observation for other suspected diseases and conditions ruled out: Secondary | ICD-10-CM | POA: Diagnosis not present

## 2021-02-26 HISTORY — PX: I & D EXTREMITY: SHX5045

## 2021-02-26 SURGERY — IRRIGATION AND DEBRIDEMENT EXTREMITY
Anesthesia: General | Laterality: Left

## 2021-02-26 MED ORDER — CEFAZOLIN SODIUM-DEXTROSE 2-4 GM/100ML-% IV SOLN
INTRAVENOUS | Status: AC
  Filled 2021-02-26: qty 100

## 2021-02-26 MED ORDER — PROPOFOL 10 MG/ML IV BOLUS
INTRAVENOUS | Status: AC
  Filled 2021-02-26: qty 20

## 2021-02-26 MED ORDER — FENTANYL CITRATE (PF) 100 MCG/2ML IJ SOLN: 25.0000 ug | INTRAMUSCULAR | Status: DC | PRN

## 2021-02-26 MED ORDER — 0.9 % SODIUM CHLORIDE (POUR BTL) OPTIME
TOPICAL | Status: DC | PRN
  Administered 2021-02-26: 1000 mL

## 2021-02-26 MED ORDER — LEVOFLOXACIN 750 MG PO TABS: 750.0000 mg | ORAL_TABLET | Freq: Every day | ORAL | 0 refills | Status: AC

## 2021-02-26 MED ORDER — ONDANSETRON HCL 4 MG/2ML IJ SOLN
INTRAMUSCULAR | Status: DC | PRN
  Administered 2021-02-26: 4 mg via INTRAVENOUS

## 2021-02-26 MED ORDER — ONDANSETRON HCL 4 MG/2ML IJ SOLN
INTRAMUSCULAR | Status: AC
  Filled 2021-02-26: qty 2

## 2021-02-26 MED ORDER — LIDOCAINE 2% (20 MG/ML) 5 ML SYRINGE
INTRAMUSCULAR | Status: DC | PRN
  Administered 2021-02-26: 100 mg via INTRAVENOUS

## 2021-02-26 MED ORDER — MIDAZOLAM HCL 2 MG/2ML IJ SOLN
INTRAMUSCULAR | Status: AC
  Filled 2021-02-26: qty 2

## 2021-02-26 MED ORDER — FENTANYL CITRATE (PF) 250 MCG/5ML IJ SOLN
INTRAMUSCULAR | Status: DC | PRN
  Administered 2021-02-26 (×5): 50 ug via INTRAVENOUS

## 2021-02-26 MED ORDER — SUCCINYLCHOLINE CHLORIDE 200 MG/10ML IV SOSY
PREFILLED_SYRINGE | INTRAVENOUS | Status: AC
  Filled 2021-02-26: qty 10

## 2021-02-26 MED ORDER — PHENYLEPHRINE 40 MCG/ML (10ML) SYRINGE FOR IV PUSH (FOR BLOOD PRESSURE SUPPORT)
PREFILLED_SYRINGE | INTRAVENOUS | Status: DC | PRN
  Administered 2021-02-26 (×3): 40 ug via INTRAVENOUS

## 2021-02-26 MED ORDER — PHENYLEPHRINE 40 MCG/ML (10ML) SYRINGE FOR IV PUSH (FOR BLOOD PRESSURE SUPPORT)
PREFILLED_SYRINGE | INTRAVENOUS | Status: AC
  Filled 2021-02-26: qty 10

## 2021-02-26 MED ORDER — TOBRAMYCIN SULFATE 1.2 G IJ SOLR
INTRAMUSCULAR | Status: DC | PRN
  Administered 2021-02-26: 1.2 g via TOPICAL

## 2021-02-26 MED ORDER — PROPOFOL 10 MG/ML IV BOLUS
INTRAVENOUS | Status: DC | PRN
Start: 2021-02-26 — End: 2021-02-26
  Administered 2021-02-26: 50 mg via INTRAVENOUS
  Administered 2021-02-26: 150 mg via INTRAVENOUS
  Administered 2021-02-26: 50 mg via INTRAVENOUS

## 2021-02-26 MED ORDER — LIDOCAINE 2% (20 MG/ML) 5 ML SYRINGE
INTRAMUSCULAR | Status: AC
  Filled 2021-02-26: qty 5

## 2021-02-26 MED ORDER — VANCOMYCIN HCL 1000 MG IV SOLR
INTRAVENOUS | Status: AC
  Filled 2021-02-26: qty 20

## 2021-02-26 MED ORDER — ORAL CARE MOUTH RINSE: 15.0000 mL | Freq: Once | OROMUCOSAL | Status: AC

## 2021-02-26 MED ORDER — VANCOMYCIN HCL 1000 MG IV SOLR
INTRAVENOUS | Status: DC | PRN
  Administered 2021-02-26: 1000 mg via TOPICAL

## 2021-02-26 MED ORDER — HYDROCODONE-ACETAMINOPHEN 7.5-325 MG PO TABS: 1.0000 | ORAL_TABLET | Freq: Once | ORAL | Status: DC | PRN

## 2021-02-26 MED ORDER — CHLORHEXIDINE GLUCONATE 0.12 % MT SOLN
OROMUCOSAL | Status: AC
  Administered 2021-02-26: 15 mL via OROMUCOSAL
  Filled 2021-02-26: qty 15

## 2021-02-26 MED ORDER — CEFAZOLIN SODIUM-DEXTROSE 2-4 GM/100ML-% IV SOLN
2.0000 g | INTRAVENOUS | Status: AC
  Administered 2021-02-26: 2 g via INTRAVENOUS

## 2021-02-26 MED ORDER — LACTATED RINGERS IV SOLN: INTRAVENOUS | Status: DC

## 2021-02-26 MED ORDER — DEXAMETHASONE SODIUM PHOSPHATE 10 MG/ML IJ SOLN
INTRAMUSCULAR | Status: DC | PRN
  Administered 2021-02-26: 10 mg via INTRAVENOUS

## 2021-02-26 MED ORDER — BACITRACIN ZINC 500 UNIT/GM EX OINT
TOPICAL_OINTMENT | CUTANEOUS | Status: AC
  Filled 2021-02-26: qty 28.35

## 2021-02-26 MED ORDER — MIDAZOLAM HCL 2 MG/2ML IJ SOLN
INTRAMUSCULAR | Status: DC | PRN
Start: 2021-02-26 — End: 2021-02-26
  Administered 2021-02-26: 4 mg via INTRAVENOUS

## 2021-02-26 MED ORDER — ROCURONIUM BROMIDE 10 MG/ML (PF) SYRINGE
PREFILLED_SYRINGE | INTRAVENOUS | Status: AC
  Filled 2021-02-26: qty 10

## 2021-02-26 MED ORDER — DOXYCYCLINE MONOHYDRATE 100 MG PO CAPS: 100.0000 mg | ORAL_CAPSULE | Freq: Two times a day (BID) | ORAL | 0 refills | Status: AC

## 2021-02-26 MED ORDER — HYDROCODONE-ACETAMINOPHEN 5-325 MG PO TABS: 1.0000 | ORAL_TABLET | Freq: Four times a day (QID) | ORAL | 0 refills | Status: DC | PRN

## 2021-02-26 MED ORDER — TOBRAMYCIN SULFATE 1.2 G IJ SOLR
INTRAMUSCULAR | Status: AC
  Filled 2021-02-26: qty 1.2

## 2021-02-26 MED ORDER — DEXAMETHASONE SODIUM PHOSPHATE 10 MG/ML IJ SOLN
INTRAMUSCULAR | Status: AC
  Filled 2021-02-26: qty 1

## 2021-02-26 MED ORDER — FENTANYL CITRATE (PF) 250 MCG/5ML IJ SOLN
INTRAMUSCULAR | Status: AC
  Filled 2021-02-26: qty 5

## 2021-02-26 MED ORDER — CHLORHEXIDINE GLUCONATE 0.12 % MT SOLN: 15.0000 mL | Freq: Once | OROMUCOSAL | Status: AC

## 2021-02-26 SURGICAL SUPPLY — 52 items
APL PRP STRL LF DISP 70% ISPRP (MISCELLANEOUS) ×1
BAG COUNTER SPONGE SURGICOUNT (BAG) ×3 IMPLANT
BAG SPNG CNTER NS LX DISP (BAG) ×1
BNDG COHESIVE 4X5 TAN STRL (GAUZE/BANDAGES/DRESSINGS) ×2 IMPLANT
BNDG ELASTIC 3X5.8 VLCR STR LF (GAUZE/BANDAGES/DRESSINGS) ×1 IMPLANT
BNDG ELASTIC 4X5.8 VLCR STR LF (GAUZE/BANDAGES/DRESSINGS) ×1 IMPLANT
BNDG GAUZE ELAST 4 BULKY (GAUZE/BANDAGES/DRESSINGS) ×4 IMPLANT
BRUSH SCRUB EZ PLAIN DRY (MISCELLANEOUS) ×6 IMPLANT
CHLORAPREP W/TINT 26 (MISCELLANEOUS) ×3 IMPLANT
COVER MAYO STAND STRL (DRAPES) ×2 IMPLANT
COVER SURGICAL LIGHT HANDLE (MISCELLANEOUS) ×6 IMPLANT
DRAPE ORTHO SPLIT 77X108 STRL (DRAPES) ×2
DRAPE SURG 17X23 STRL (DRAPES) ×3 IMPLANT
DRAPE SURG ORHT 6 SPLT 77X108 (DRAPES) ×2 IMPLANT
DRAPE U-SHAPE 47X51 STRL (DRAPES) ×3 IMPLANT
DRSG ADAPTIC 3X8 NADH LF (GAUZE/BANDAGES/DRESSINGS) ×3 IMPLANT
ELECT REM PT RETURN 9FT ADLT (ELECTROSURGICAL)
ELECTRODE REM PT RTRN 9FT ADLT (ELECTROSURGICAL) IMPLANT
EVACUATOR 1/8 PVC DRAIN (DRAIN) IMPLANT
GAUZE SPONGE 4X4 12PLY STRL (GAUZE/BANDAGES/DRESSINGS) ×2 IMPLANT
GAUZE SPONGE 4X4 12PLY STRL LF (GAUZE/BANDAGES/DRESSINGS) ×1 IMPLANT
GLOVE SURG ENC MOIS LTX SZ6.5 (GLOVE) ×9 IMPLANT
GLOVE SURG ENC MOIS LTX SZ7.5 (GLOVE) ×12 IMPLANT
GLOVE SURG UNDER POLY LF SZ6.5 (GLOVE) ×3 IMPLANT
GLOVE SURG UNDER POLY LF SZ7.5 (GLOVE) ×3 IMPLANT
GOWN STRL REUS W/ TWL LRG LVL3 (GOWN DISPOSABLE) ×4 IMPLANT
GOWN STRL REUS W/TWL LRG LVL3 (GOWN DISPOSABLE) ×4
HANDPIECE INTERPULSE COAX TIP (DISPOSABLE)
KIT BASIN OR (CUSTOM PROCEDURE TRAY) ×3 IMPLANT
KIT TURNOVER KIT B (KITS) ×3 IMPLANT
MANIFOLD NEPTUNE II (INSTRUMENTS) ×3 IMPLANT
NS IRRIG 1000ML POUR BTL (IV SOLUTION) ×3 IMPLANT
PACK ORTHO EXTREMITY (CUSTOM PROCEDURE TRAY) ×3 IMPLANT
PAD ARMBOARD 7.5X6 YLW CONV (MISCELLANEOUS) ×6 IMPLANT
PAD CAST 4YDX4 CTTN HI CHSV (CAST SUPPLIES) IMPLANT
PADDING CAST ABS 3INX4YD NS (CAST SUPPLIES) ×1
PADDING CAST ABS COTTON 3X4 (CAST SUPPLIES) IMPLANT
PADDING CAST COTTON 4X4 STRL (CAST SUPPLIES) ×2
PADDING CAST COTTON 6X4 STRL (CAST SUPPLIES) ×2 IMPLANT
SET HNDPC FAN SPRY TIP SCT (DISPOSABLE) IMPLANT
SPONGE T-LAP 18X18 ~~LOC~~+RFID (SPONGE) ×3 IMPLANT
SUT ETHILON 2 0 FS 18 (SUTURE) ×4 IMPLANT
SUT ETHILON 3 0 PS 1 (SUTURE) ×7 IMPLANT
SUT MON AB 2-0 CT1 36 (SUTURE) ×2 IMPLANT
SUT PDS AB 0 CT 36 (SUTURE) IMPLANT
SWAB CULTURE ESWAB REG 1ML (MISCELLANEOUS) ×1 IMPLANT
TOWEL GREEN STERILE (TOWEL DISPOSABLE) ×6 IMPLANT
TOWEL GREEN STERILE FF (TOWEL DISPOSABLE) ×3 IMPLANT
TUBE CONNECTING 12X1/4 (SUCTIONS) ×3 IMPLANT
UNDERPAD 30X36 HEAVY ABSORB (UNDERPADS AND DIAPERS) ×3 IMPLANT
WATER STERILE IRR 1000ML POUR (IV SOLUTION) ×3 IMPLANT
YANKAUER SUCT BULB TIP NO VENT (SUCTIONS) ×3 IMPLANT
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
## 2021-02-26 NOTE — Anesthesia Postprocedure Evaluation (Signed)
Anesthesia Post Note  Patient: Jeremy George.  Procedure(s) Performed: IRRIGATION AND DEBRIDEMENT ARM/HUMERUS (Left)     Patient location during evaluation: PACU Anesthesia Type: General Level of consciousness: awake Pain management: pain level controlled Vital Signs Assessment: post-procedure vital signs reviewed and stable Respiratory status: spontaneous breathing Cardiovascular status: stable Postop Assessment: no apparent nausea or vomiting Anesthetic complications: no   No notable events documented.  Last Vitals:  Vitals:   02/26/21 0920 02/26/21 0935  BP: 119/90 (!) 121/97  Pulse: 72 66  Resp: 15 13  Temp:  36.6 C  SpO2: 100% 100%    Last Pain:  Vitals:   02/26/21 0935  TempSrc:   PainSc: 0-No pain                 Zahid Carneiro

## 2021-02-26 NOTE — Anesthesia Preprocedure Evaluation (Signed)
Anesthesia Evaluation  Patient identified by MRN, date of birth, ID band Patient awake    Reviewed: Allergy & Precautions, NPO status , Patient's Chart, lab work & pertinent test results  Airway Mallampati: II  TM Distance: >3 FB     Dental   Pulmonary Current Smoker and Patient abstained from smoking.,    breath sounds clear to auscultation       Cardiovascular negative cardio ROS   Rhythm:Regular Rate:Normal     Neuro/Psych negative neurological ROS     GI/Hepatic negative GI ROS, Neg liver ROS,   Endo/Other    Renal/GU negative Renal ROS     Musculoskeletal   Abdominal   Peds  Hematology   Anesthesia Other Findings   Reproductive/Obstetrics                             Anesthesia Physical Anesthesia Plan  ASA: 2  Anesthesia Plan: General   Post-op Pain Management:    Induction:   PONV Risk Score and Plan: Treatment may vary due to age or medical condition, Ondansetron, Dexamethasone and Midazolam  Airway Management Planned: LMA  Additional Equipment:   Intra-op Plan:   Post-operative Plan:   Informed Consent: I have reviewed the patients History and Physical, chart, labs and discussed the procedure including the risks, benefits and alternatives for the proposed anesthesia with the patient or authorized representative who has indicated his/her understanding and acceptance.       Plan Discussed with: Anesthesiologist and CRNA  Anesthesia Plan Comments:         Anesthesia Quick Evaluation

## 2021-02-26 NOTE — Interval H&P Note (Signed)
History and Physical Interval Note:  02/26/2021 7:24 AM  Jeremy George.  has presented today for surgery, with the diagnosis of Left arm infection.  The various methods of treatment have been discussed with the patient and family. After consideration of risks, benefits and other options for treatment, the patient has consented to  Procedure(s): IRRIGATION AND DEBRIDEMENT ARM/HUMERUS (Left) as a surgical intervention.  The patient's history has been reviewed, patient examined, no change in status, stable for surgery.  I have reviewed the patient's chart and labs.  Questions were answered to the patient's satisfaction.     Caryn Bee P Elchanan Bob

## 2021-02-26 NOTE — Transfer of Care (Signed)
Immediate Anesthesia Transfer of Care Note  Patient: Jeremy George.  Procedure(s) Performed: IRRIGATION AND DEBRIDEMENT ARM/HUMERUS (Left)  Patient Location: PACU  Anesthesia Type:General  Level of Consciousness: awake, alert , oriented and pateint uncooperative  Airway & Oxygen Therapy: Patient Spontanous Breathing and Patient connected to face mask oxygen  Post-op Assessment: Report given to RN, Post -op Vital signs reviewed and stable, Patient moving all extremities X 4 and Patient able to stick tongue midline  Post vital signs: Reviewed  Last Vitals:  Vitals Value Taken Time  BP 100/69 02/26/21 0906  Temp 97.6   Pulse 94 02/26/21 0912  Resp 14 02/26/21 0912  SpO2 100 % 02/26/21 0912  Vitals shown include unvalidated device data.  Last Pain:  Vitals:   02/26/21 0612  TempSrc:   PainSc: 0-No pain         Complications: No notable events documented.

## 2021-02-26 NOTE — Discharge Instructions (Addendum)
[facility] Discharge Instructions   General Discharge Instructions  WEIGHT BEARING STATUS:Weightbearing as tolerated  RANGE OF MOTION/ACTIVITY:OK for elbow motion as tolerated  Wound Care:Keep dressing in place until Sunday 02/29/20. Incisions can be left open to air if there is no drainage. If incision continues to have drainage, follow wound care instructions below. Okay to shower if no drainage from incisions.  DVT/PE prophylaxis: None  Diet: as you were eating previously.  Can use over the counter stool softeners and bowel preparations, such as Miralax, to help with bowel movements.  Narcotics can be constipating.  Be sure to drink plenty of fluids  PAIN MEDICATION USE AND EXPECTATIONS  You have likely been given narcotic medications to help control your pain.  After a traumatic event that results in an fracture (broken bone) with or without surgery, it is ok to use narcotic pain medications to help control one's pain.  We understand that everyone responds to pain differently and each individual patient will be evaluated on a regular basis for the continued need for narcotic medications. Ideally, narcotic medication use should last no more than 6-8 weeks (coinciding with fracture healing).   As a patient it is your responsibility as well to monitor narcotic medication use and report the amount and frequency you use these medications when you come to your office visit.   We would also advise that if you are using narcotic medications, you should take a dose prior to therapy to maximize you participation.  IF YOU ARE ON NARCOTIC MEDICATIONS IT IS NOT PERMISSIBLE TO OPERATE A MOTOR VEHICLE (MOTORCYCLE/CAR/TRUCK/MOPED) OR HEAVY MACHINERY DO NOT MIX NARCOTICS WITH OTHER CNS (CENTRAL NERVOUS SYSTEM) DEPRESSANTS SUCH AS ALCOHOL   STOP SMOKING OR USING NICOTINE PRODUCTS!!!!  As discussed nicotine severely impairs your body's ability to heal surgical and traumatic wounds but  also impairs bone healing.  Wounds and bone heal by forming microscopic blood vessels (angiogenesis) and nicotine is a vasoconstrictor (essentially, shrinks blood vessels).  Therefore, if vasoconstriction occurs to these microscopic blood vessels they essentially disappear and are unable to deliver necessary nutrients to the healing tissue.  This is one modifiable factor that you can do to dramatically increase your chances of healing your injury.    (This means no smoking, no nicotine gum, patches, etc)  DO NOT USE NONSTEROIDAL ANTI-INFLAMMATORY DRUGS (NSAID'S)  Using products such as Advil (ibuprofen), Aleve (naproxen), Motrin (ibuprofen) for additional pain control during fracture healing can delay and/or prevent the healing response.  If you would like to take over the counter (OTC) medication, Tylenol (acetaminophen) is ok.  However, some narcotic medications that are given for pain control contain acetaminophen as well. Therefore, you should not exceed more than 4000 mg of tylenol in a day if you do not have liver disease.  Also note that there are may OTC medicines, such as cold medicines and allergy medicines that my contain tylenol as well.  If you have any questions about medications and/or interactions please ask your doctor/PA or your pharmacist.      ICE AND ELEVATE INJURED/OPERATIVE EXTREMITY  Using ice and elevating the injured extremity above your heart can help with swelling and pain control.  Icing in a pulsatile fashion, such as 20 minutes on and 20 minutes off, can be followed.    Do not place ice directly on skin. Make sure there is a barrier between to skin and the ice pack.    Using frozen items such as frozen peas works well  as the conform nicely to the are that needs to be iced.  USE AN ACE WRAP OR TED HOSE FOR SWELLING CONTROL  In addition to icing and elevation, Ace wraps or TED hose are used to help limit and resolve swelling.  It is recommended to use Ace wraps or TED hose  until you are informed to stop.    When using Ace Wraps start the wrapping distally (farthest away from the body) and wrap proximally (closer to the body)   Example: If you had surgery on your leg or thing and you do not have a splint on, start the ace wrap at the toes and work your way up to the thigh        If you had surgery on your upper extremity and do not have a splint on, start the ace wrap at your fingers and work your way up to the upper arm   CALL THE OFFICE WITH ANY QUESTIONS OR CONCERNS: [phone]   VISIT OUR WEBSITE FOR ADDITIONAL INFORMATION: [email or web address]     Discharge Wound Care Instructions  Do NOT apply any ointments, solutions or lotions to pin sites or surgical wounds.  These prevent needed drainage and even though solutions like hydrogen peroxide kill bacteria, they also damage cells lining the pin sites that help fight infection.  Applying lotions or ointments can keep the wounds moist and can cause them to breakdown and open up as well. This can increase the risk for infection. When in doubt call the office.  Surgical incisions should be dressed daily.  If any drainage is noted, use one layer of adaptic, then gauze, Kerlix, and an ace wrap.  Once the incision is completely dry and without drainage, it may be left open to air out.  Showering may begin 36-48 hours later.  Cleaning gently with soap and water.  Traumatic wounds should be dressed daily as well.    One layer of adaptic, gauze, Kerlix, then ace wrap.  The adaptic can be discontinued once the draining has ceased    If you have a wet to dry dressing: wet the gauze with saline the squeeze as much saline out so the gauze is moist (not soaking wet), place moistened gauze over wound, then place a dry gauze over the moist one, followed by Kerlix wrap, then ace wrap.

## 2021-02-26 NOTE — Anesthesia Procedure Notes (Signed)
Procedure Name: LMA Insertion Date/Time: 02/26/2021 7:47 AM Performed by: Cy Blamer, CRNA Pre-anesthesia Checklist: Patient identified, Emergency Drugs available, Suction available and Patient being monitored Patient Re-evaluated:Patient Re-evaluated prior to induction Oxygen Delivery Method: Circle system utilized Preoxygenation: Pre-oxygenation with 100% oxygen Induction Type: IV induction LMA: LMA inserted LMA Size: 5.0 Number of attempts: 2 Placement Confirmation: positive ETCO2 and breath sounds checked- equal and bilateral Tube secured with: Tape Dental Injury: Teeth and Oropharynx as per pre-operative assessment

## 2021-02-27 ENCOUNTER — Encounter (HOSPITAL_COMMUNITY): Payer: Self-pay | Admitting: Student

## 2021-03-03 LAB — AEROBIC/ANAEROBIC CULTURE W GRAM STAIN (SURGICAL/DEEP WOUND): Gram Stain: NONE SEEN

## 2021-03-30 DIAGNOSIS — S42412D Displaced simple supracondylar fracture without intercondylar fracture of left humerus, subsequent encounter for fracture with routine healing: Secondary | ICD-10-CM | POA: Diagnosis not present

## 2021-04-21 DIAGNOSIS — Z419 Encounter for procedure for purposes other than remedying health state, unspecified: Secondary | ICD-10-CM | POA: Diagnosis not present

## 2021-04-28 ENCOUNTER — Ambulatory Visit: Payer: Medicaid Other | Admitting: Physical Medicine & Rehabilitation

## 2021-04-30 DIAGNOSIS — M869 Osteomyelitis, unspecified: Secondary | ICD-10-CM | POA: Diagnosis not present

## 2021-05-22 DIAGNOSIS — Z419 Encounter for procedure for purposes other than remedying health state, unspecified: Secondary | ICD-10-CM | POA: Diagnosis not present

## 2021-06-14 DIAGNOSIS — M25552 Pain in left hip: Secondary | ICD-10-CM | POA: Diagnosis not present

## 2021-06-21 DIAGNOSIS — Z419 Encounter for procedure for purposes other than remedying health state, unspecified: Secondary | ICD-10-CM | POA: Diagnosis not present

## 2021-07-02 ENCOUNTER — Encounter (HOSPITAL_COMMUNITY): Payer: Self-pay

## 2021-07-02 ENCOUNTER — Other Ambulatory Visit: Payer: Self-pay

## 2021-07-02 ENCOUNTER — Telehealth: Payer: Self-pay

## 2021-07-02 ENCOUNTER — Emergency Department (HOSPITAL_COMMUNITY)
Admission: EM | Admit: 2021-07-02 | Discharge: 2021-07-02 | Discharge status: Against Medical Advice | Payer: Medicaid Other | Attending: Emergency Medicine | Admitting: Emergency Medicine

## 2021-07-02 DIAGNOSIS — Z046 Encounter for general psychiatric examination, requested by authority: Secondary | ICD-10-CM | POA: Diagnosis present

## 2021-07-02 DIAGNOSIS — F1910 Other psychoactive substance abuse, uncomplicated: Secondary | ICD-10-CM

## 2021-07-02 DIAGNOSIS — Z658 Other specified problems related to psychosocial circumstances: Secondary | ICD-10-CM

## 2021-07-02 DIAGNOSIS — F329 Major depressive disorder, single episode, unspecified: Secondary | ICD-10-CM | POA: Insufficient documentation

## 2021-07-02 LAB — COMPREHENSIVE METABOLIC PANEL
ALT: 15 U/L (ref 0–44)
AST: 24 U/L (ref 15–41)
Albumin: 4.8 g/dL (ref 3.5–5.0)
Alkaline Phosphatase: 136 U/L — ABNORMAL HIGH (ref 38–126)
Anion gap: 10 (ref 5–15)
BUN: 6 mg/dL (ref 6–20)
CO2: 28 mmol/L (ref 22–32)
Calcium: 10 mg/dL (ref 8.9–10.3)
Chloride: 103 mmol/L (ref 98–111)
Creatinine, Ser: 0.98 mg/dL (ref 0.61–1.24)
GFR, Estimated: >60 mL/min (ref >60)
Glucose, Bld: 111 mg/dL — ABNORMAL HIGH (ref 70–99)
Potassium: 3.9 mmol/L (ref 3.5–5.1)
Sodium: 141 mmol/L (ref 135–145)
Total Bilirubin: 0.7 mg/dL (ref 0.3–1.2)
Total Protein: 8.1 g/dL (ref 6.5–8.1)

## 2021-07-02 LAB — CBC
HCT: 53.7 % — ABNORMAL HIGH (ref 39.0–52.0)
Hemoglobin: 17.4 g/dL — ABNORMAL HIGH (ref 13.0–17.0)
MCH: 28.6 pg (ref 26.0–34.0)
MCHC: 32.4 g/dL (ref 30.0–36.0)
MCV: 88.2 fL (ref 80.0–100.0)
Platelets: 267 10*3/uL (ref 150–400)
RBC: 6.09 MIL/uL — ABNORMAL HIGH (ref 4.22–5.81)
RDW: 14 % (ref 11.5–15.5)
WBC: 5.8 10*3/uL (ref 4.0–10.5)
nRBC: 0 % (ref 0.0–0.2)

## 2021-07-02 LAB — SALICYLATE LEVEL: Salicylate Lvl: <7 mg/dL — ABNORMAL LOW (ref 7.0–30.0)

## 2021-07-02 LAB — RAPID URINE DRUG SCREEN, HOSP PERFORMED
Amphetamines: POSITIVE — AB
Barbiturates: NOT DETECTED
Benzodiazepines: NOT DETECTED
Cocaine: POSITIVE — AB
Opiates: NOT DETECTED
Tetrahydrocannabinol: POSITIVE — AB

## 2021-07-02 LAB — ETHANOL: Alcohol, Ethyl (B): <10 mg/dL (ref <10)

## 2021-07-02 LAB — ACETAMINOPHEN LEVEL: Acetaminophen (Tylenol), Serum: <10 ug/mL — ABNORMAL LOW (ref 10–30)

## 2021-07-02 MED ORDER — LORAZEPAM 1 MG PO TABS
1.0000 mg | ORAL_TABLET | Freq: Once | ORAL | Status: AC
  Administered 2021-07-02: 1 mg via ORAL
  Filled 2021-07-02: qty 1
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
## 2021-07-02 NOTE — Telephone Encounter (Signed)
Jenkins Rouge (Mother) called: ? ? Jeremy George  (TBI) has been very angry and upset for about a week. He is not a threat to himself. But had mention wanting to hurt others. She stated he is having a mental breakdown.  ? ?Dr. Kieth Brightly is not in the office today. Caller is aware  that Dr. Riley Kill is not in the office.  ? ?Jenkins Rouge (Mother) has been advised to take him to the Hospital for evaluation. ASAP. Dr. Riley Kill will be informed.  ? ? ?

## 2021-07-02 NOTE — ED Notes (Addendum)
Pt VOLUNTARY-Pt stated he would be willing to stay for a little bit more and take medicine  ?

## 2021-07-02 NOTE — ED Notes (Signed)
Pt belongings placed in locker 3 in [facility].  ?

## 2021-07-02 NOTE — ED Notes (Addendum)
Pt notified staff that he would be leaving at 1730, MD made aware. Pt is still voluntary ?

## 2021-07-02 NOTE — ED Notes (Signed)
Pt given his belongings and valuables back. Staff explained that he was leaving AMA and notified him of the other resources offered. Pt continuing to want to leave. Pt seen walking out of ED with belongings and his clothes on. ?

## 2021-07-02 NOTE — ED Triage Notes (Signed)
Pt states he wants to be evaluated for his mental health. Pt states he hasn't eatenx4-5 days. Pt states he has been drinking fluids. Pt states, "I stay in my thoughts in my head all day. I thing the worst of everything." Pt denies SI/HI.  ?

## 2021-07-02 NOTE — ED Notes (Signed)
RN talking with pt about POC and dressing out into purple scrubs. Pt states "im about to go" RN redirected pt. Pt continues to denies SI/HI. Pt states "the longer I sit here the more im in my head, I could just be at home". PA notified. PA offer meds. Pt declined meds at this tim.  ?

## 2021-07-02 NOTE — ED Notes (Signed)
Pt stated "I cant sit here any longer, im leaving" RN talked with pt about POC and changing into scrubs. RN redirected pt, pt calmed. RN informed MD>  ?

## 2021-07-03 ENCOUNTER — Emergency Department (HOSPITAL_COMMUNITY)
Admission: EM | Admit: 2021-07-03 | Discharge: 2021-07-03 | Disposition: A | Discharge status: Home / Self Care | Payer: Medicaid Other | Attending: Emergency Medicine | Admitting: Emergency Medicine

## 2021-07-03 DIAGNOSIS — S069X9S Unspecified intracranial injury with loss of consciousness of unspecified duration, sequela: Secondary | ICD-10-CM

## 2021-07-03 DIAGNOSIS — X58XXXS Exposure to other specified factors, sequela: Secondary | ICD-10-CM | POA: Insufficient documentation

## 2021-07-03 DIAGNOSIS — R451 Restlessness and agitation: Secondary | ICD-10-CM | POA: Diagnosis not present

## 2021-07-03 DIAGNOSIS — F32A Depression, unspecified: Secondary | ICD-10-CM | POA: Insufficient documentation

## 2021-07-03 DIAGNOSIS — S062X9S Diffuse traumatic brain injury with loss of consciousness of unspecified duration, sequela: Secondary | ICD-10-CM | POA: Insufficient documentation

## 2021-07-03 DIAGNOSIS — S0990XS Unspecified injury of head, sequela: Secondary | ICD-10-CM | POA: Diagnosis present

## 2021-07-03 DIAGNOSIS — X58XXXA Exposure to other specified factors, initial encounter: Secondary | ICD-10-CM | POA: Diagnosis not present

## 2021-07-03 MED ORDER — CITALOPRAM HYDROBROMIDE 10 MG PO TABS: 10.0000 mg | ORAL_TABLET | Freq: Every evening | ORAL | 0 refills | Status: DC

## 2021-07-03 MED ORDER — QUETIAPINE FUMARATE 100 MG PO TABS: 100.0000 mg | ORAL_TABLET | Freq: Every day | ORAL | 0 refills | Status: DC

## 2021-07-03 MED ORDER — VALPROIC ACID 250 MG PO CAPS
250.0000 mg | ORAL_CAPSULE | Freq: Four times a day (QID) | ORAL | 0 refills | Status: DC
Start: 2021-07-03 — End: 2021-07-28

## 2021-07-03 NOTE — ED Provider Triage Note (Signed)
Emergency Medicine Provider Triage Evaluation Note ? ?Jeremy George. , a 26 y.o. male  was evaluated in triage.  Pt presents today requesting psychiatric evaluation. Was originally seen yesterday for same and was medically cleared but left before speaking with TTS. States that nothing has changed since he was seen yesterday, however states 'I've had a lot of time to think and decided it was best that I be seen.' Returns today for same. Denies SI/HI, however states that he will harm someone if he cannot see his son graduate. Denies AVH or any other somatic complaints at this time. ? ?Review of Systems  ?Positive:  ?Negative: See above ? ?Physical Exam  ?BP (!) 139/100   Pulse 85   Temp 98.6 ?F (37 ?C) (Oral)   Resp 14   SpO2 100%  ?Gen:   Awake, no distress   ?Resp:  Normal effort  ?MSK:   Moves extremities without difficulty  ?Other:   ? ?Medical Decision Making  ?Medically screening exam initiated at 10:50 AM.  Appropriate orders placed.  Jeremy George. was informed that the remainder of the evaluation will be completed by another provider, this initial triage assessment does not replace that evaluation, and the importance of remaining in the ED until their evaluation is complete. ? ?Medical clearance labs completed less than 24 hours ago without acute abnormality. UDS + for THC, amphetamines, and cocaine. TTS consult placed ?  ?Silva Bandy, PA-C ?07/03/21 1427 ? ?

## 2021-07-03 NOTE — ED Notes (Signed)
DC instructions reviewed with pt. PT verbalized understanding. PT DC °

## 2021-07-03 NOTE — ED Triage Notes (Signed)
Pt. Stated, I feel like something in my head and its just full and I have thoughts and they are not related to what im doing ?
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
## 2021-07-03 NOTE — BH Assessment (Addendum)
Writer contacted RN at [phone] who assisted by passing information on to nursing staff attending to the patient and provided this writer's number at [facility] to contact when they were available to set up machine. Status pending.   ?

## 2021-07-05 ENCOUNTER — Telehealth: Payer: Self-pay

## 2021-07-05 NOTE — Telephone Encounter (Signed)
Transition Care Management Unsuccessful Follow-up Telephone Call ? ?Date of discharge and from where:  07/03/2021 from [facility] ? ?Attempts:  1st Attempt ? ?Reason for unsuccessful TCM follow-up call:  Unable to leave message ? ? ? ?

## 2021-07-06 NOTE — Telephone Encounter (Signed)
Transition Care Management Follow-up Telephone Call ?Date of discharge and from where: 07/03/2021 from [facility] ?How have you been since you were released from the hospital? Patient stated that he is feeling better and did not have any questions or concerns. Patient has follow up appts scheduled.  ?Any questions or concerns? No ? ?Items Reviewed: ?Did the pt receive and understand the discharge instructions provided? Yes  ?Medications obtained and verified? Yes  ?Other? No  ?Any new allergies since your discharge? No  ?Dietary orders reviewed? No ?Do you have support at home? Yes  ? ?Functional Questionnaire: (I = Independent and D = Dependent) ?ADLs: I ? ?Bathing/Dressing- I ? ?Meal Prep- I ? ?Eating- I ? ?Maintaining continence- I ? ?Transferring/Ambulation- I ? ?Managing Meds- I ? ? ?Follow up appointments reviewed: ? ?PCP Hospital f/u appt confirmed? No   ?Specialist Hospital f/u appt confirmed? Yes  BH appt coming up.  ?Are transportation arrangements needed? No  ?If their condition worsens, is the pt aware to call PCP or go to the Emergency Dept.? Yes ?Was the patient provided with contact information for the PCP's office or ED? Yes ?Was to pt encouraged to call back with questions or concerns? Yes ? ? ?

## 2021-07-22 DIAGNOSIS — Z419 Encounter for procedure for purposes other than remedying health state, unspecified: Secondary | ICD-10-CM | POA: Diagnosis not present

## 2021-07-28 ENCOUNTER — Encounter: Payer: Self-pay | Admitting: Physical Medicine & Rehabilitation

## 2021-07-28 ENCOUNTER — Encounter: Payer: Medicaid Other | Attending: Physical Medicine & Rehabilitation | Admitting: Physical Medicine & Rehabilitation

## 2021-07-28 VITALS — BP 132/82 | HR 89 | Ht 74.0 in | Wt 155.2 lb

## 2021-07-28 DIAGNOSIS — S069X0S Unspecified intracranial injury without loss of consciousness, sequela: Secondary | ICD-10-CM | POA: Insufficient documentation

## 2021-07-28 DIAGNOSIS — R4689 Other symptoms and signs involving appearance and behavior: Secondary | ICD-10-CM | POA: Diagnosis not present

## 2021-07-28 MED ORDER — DIVALPROEX SODIUM 500 MG PO DR TAB: 500.0000 mg | DELAYED_RELEASE_TABLET | Freq: Three times a day (TID) | ORAL | 4 refills | Status: DC

## 2021-07-28 MED ORDER — CITALOPRAM HYDROBROMIDE 10 MG PO TABS: 10.0000 mg | ORAL_TABLET | Freq: Every evening | ORAL | 4 refills | Status: DC

## 2021-07-28 MED ORDER — QUETIAPINE FUMARATE 100 MG PO TABS: 100.0000 mg | ORAL_TABLET | Freq: Every day | ORAL | 4 refills | Status: DC

## 2021-07-28 NOTE — Patient Instructions (Addendum)
PLEASE FEEL FREE TO CALL OUR OFFICE WITH ANY PROBLEMS OR QUESTIONS ([phone])      
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
## 2021-08-21 DIAGNOSIS — Z419 Encounter for procedure for purposes other than remedying health state, unspecified: Secondary | ICD-10-CM | POA: Diagnosis not present

## 2021-09-21 DIAGNOSIS — Z419 Encounter for procedure for purposes other than remedying health state, unspecified: Secondary | ICD-10-CM | POA: Diagnosis not present

## 2021-10-22 DIAGNOSIS — Z419 Encounter for procedure for purposes other than remedying health state, unspecified: Secondary | ICD-10-CM | POA: Diagnosis not present

## 2021-10-27 ENCOUNTER — Encounter: Payer: Medicaid Other | Attending: Physical Medicine & Rehabilitation | Admitting: Physical Medicine & Rehabilitation

## 2021-10-27 ENCOUNTER — Encounter: Payer: Self-pay | Admitting: Physical Medicine & Rehabilitation

## 2021-10-27 VITALS — BP 134/86 | HR 68 | Ht 74.0 in | Wt 169.6 lb

## 2021-10-27 DIAGNOSIS — X58XXXD Exposure to other specified factors, subsequent encounter: Secondary | ICD-10-CM | POA: Diagnosis not present

## 2021-10-27 DIAGNOSIS — S062X9D Diffuse traumatic brain injury with loss of consciousness of unspecified duration, subsequent encounter: Secondary | ICD-10-CM | POA: Diagnosis not present

## 2021-10-27 DIAGNOSIS — S062X9S Diffuse traumatic brain injury with loss of consciousness of unspecified duration, sequela: Secondary | ICD-10-CM

## 2021-10-27 DIAGNOSIS — S32422D Displaced fracture of posterior wall of left acetabulum, subsequent encounter for fracture with routine healing: Secondary | ICD-10-CM | POA: Insufficient documentation

## 2021-10-27 DIAGNOSIS — S32422A Displaced fracture of posterior wall of left acetabulum, initial encounter for closed fracture: Secondary | ICD-10-CM | POA: Diagnosis not present
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
## 2021-10-27 NOTE — Patient Instructions (Addendum)
PLEASE FEEL FREE TO CALL OUR OFFICE WITH ANY PROBLEMS OR QUESTIONS [phone])   YOU MAY DRIVE DURING THE DAY, LOCAL DISTANCES DURING THE DAY TIME.   I WANT YOU TAKING ON MORE RESPONSIBILITIES AT HOME.

## 2021-11-16 DIAGNOSIS — S42412D Displaced simple supracondylar fracture without intercondylar fracture of left humerus, subsequent encounter for fracture with routine healing: Secondary | ICD-10-CM | POA: Diagnosis not present

## 2021-11-16 DIAGNOSIS — M25552 Pain in left hip: Secondary | ICD-10-CM | POA: Diagnosis not present

## 2021-11-17 ENCOUNTER — Other Ambulatory Visit: Payer: Self-pay | Admitting: Student

## 2021-11-17 DIAGNOSIS — M25552 Pain in left hip: Secondary | ICD-10-CM

## 2021-11-21 DIAGNOSIS — Z419 Encounter for procedure for purposes other than remedying health state, unspecified: Secondary | ICD-10-CM | POA: Diagnosis not present

## 2021-11-25 ENCOUNTER — Ambulatory Visit
Admission: RE | Admit: 2021-11-25 | Discharge: 2021-11-25 | Disposition: A | Discharge status: Home / Self Care | Payer: Medicaid Other | Source: Ambulatory Visit | Attending: Student | Admitting: Student

## 2021-11-25 DIAGNOSIS — M25552 Pain in left hip: Secondary | ICD-10-CM | POA: Diagnosis not present

## 2021-12-06 ENCOUNTER — Ambulatory Visit: Payer: Self-pay | Admitting: Student

## 2021-12-10 NOTE — Pre-Procedure Instructions (Signed)
Surgical Instructions    Your procedure is scheduled on Wednesday, October 25th.  Report to [facility] Main Entrance "A" at 05:30 A.M., then check in with the Admitting office.  Call this number if you have problems the morning of surgery:  [phone]   If you have any questions prior to your surgery date call [phone]: Open Monday-Friday 8am-4pm    Remember:  Do not eat or drink after midnight the night before your surgery      Take these medicines the morning of surgery with A SIP OF WATER: none  As of today, STOP taking any Aspirin (unless otherwise instructed by your surgeon) Aleve, Naproxen, Ibuprofen, Motrin, Advil, Goody's, BC's, all herbal medications, fish oil, and all vitamins.                     Do NOT Smoke (Tobacco/Vaping) for 24 hours prior to your procedure.  If you use a CPAP at night, you may bring your mask/headgear for your overnight stay.   Contacts, glasses, piercing's, hearing aid's, dentures or partials may not be worn into surgery, please bring cases for these belongings.    For patients admitted to the hospital, discharge time will be determined by your treatment team.   Patients discharged the day of surgery will not be allowed to drive home, and someone needs to stay with them for 24 hours.  SURGICAL WAITING ROOM VISITATION Patients having surgery or a procedure may have no more than 2 support people in the waiting area - these visitors may rotate.   Children under the age of 80 must have an adult with them who is not the patient. If the patient needs to stay at the hospital during part of their recovery, the visitor guidelines for inpatient rooms apply. Pre-op nurse will coordinate an appropriate time for 1 support person to accompany patient in pre-op.  This support person may not rotate.   Please refer to the [facility] website for the visitor guidelines for Inpatients (after your surgery is over and you are in a regular room).    Special  instructions:   [place]- Preparing For Surgery  Before surgery, you can play an important role. Because skin is not sterile, your skin needs to be as free of germs as possible. You can reduce the number of germs on your skin by washing with CHG (chlorahexidine gluconate) Soap before surgery.  CHG is an antiseptic cleaner which kills germs and bonds with the skin to continue killing germs even after washing.    Oral Hygiene is also important to reduce your risk of infection.  Remember - BRUSH YOUR TEETH THE MORNING OF SURGERY WITH YOUR REGULAR TOOTHPASTE  Please do not use if you have an allergy to CHG or antibacterial soaps. If your skin becomes reddened/irritated stop using the CHG.  Do not shave (including legs and underarms) for at least 48 hours prior to first CHG shower. It is OK to shave your face.  Please follow these instructions carefully.   Shower the NIGHT BEFORE SURGERY and the MORNING OF SURGERY  If you chose to wash your hair, wash your hair first as usual with your normal shampoo.  After you shampoo, rinse your hair and body thoroughly to remove the shampoo.  Use CHG Soap as you would any other liquid soap. You can apply CHG directly to the skin and wash gently with a scrungie or a clean washcloth.   Apply the CHG Soap to your body ONLY  FROM THE NECK DOWN.  Do not use on open wounds or open sores. Avoid contact with your eyes, ears, mouth and genitals (private parts). Wash Face and genitals (private parts)  with your normal soap.   Wash thoroughly, paying special attention to the area where your surgery will be performed.  Thoroughly rinse your body with warm water from the neck down.  DO NOT shower/wash with your normal soap after using and rinsing off the CHG Soap.  Pat yourself dry with a CLEAN TOWEL.  Wear CLEAN PAJAMAS to bed the night before surgery  Place CLEAN SHEETS on your bed the night before your surgery  DO NOT SLEEP WITH PETS.   Day of  Surgery: Take a shower with CHG soap. Do not wear jewelry  Do not wear lotions, powders, colognes, or deodorant. Men may shave face and neck. Do not bring valuables to the hospital. [facility] is not responsible for any belongings or valuables.  Wear Clean/Comfortable clothing the morning of surgery Remember to brush your teeth WITH YOUR REGULAR TOOTHPASTE.   Please read over the following fact sheets that you were given.    If you received a COVID test during your pre-op visit  it is requested that you wear a mask when out in public, stay away from anyone that may not be feeling well and notify your surgeon if you develop symptoms. If you have been in contact with anyone that has tested positive in the last 10 days please notify you surgeon.

## 2021-12-13 ENCOUNTER — Encounter (HOSPITAL_COMMUNITY)
Admission: RE | Admit: 2021-12-13 | Discharge: 2021-12-13 | Disposition: A | Discharge status: Home / Self Care | Payer: Medicaid Other | Source: Ambulatory Visit | Attending: Student | Admitting: Student

## 2021-12-13 ENCOUNTER — Encounter (HOSPITAL_COMMUNITY): Payer: Self-pay

## 2021-12-13 ENCOUNTER — Other Ambulatory Visit: Payer: Self-pay

## 2021-12-13 VITALS — BP 118/79 | HR 60 | Temp 97.5°F | Resp 17 | Ht 74.0 in | Wt 178.9 lb

## 2021-12-13 DIAGNOSIS — Z789 Other specified health status: Secondary | ICD-10-CM | POA: Diagnosis not present

## 2021-12-13 DIAGNOSIS — Z01818 Encounter for other preprocedural examination: Secondary | ICD-10-CM | POA: Diagnosis not present

## 2021-12-13 HISTORY — DX: Depression, unspecified: F32.A

## 2021-12-13 LAB — COMPREHENSIVE METABOLIC PANEL
ALT: 13 U/L (ref 0–44)
AST: 16 U/L (ref 15–41)
Albumin: 3.3 g/dL — ABNORMAL LOW (ref 3.5–5.0)
Alkaline Phosphatase: 96 U/L (ref 38–126)
Anion gap: 7 (ref 5–15)
BUN: 9 mg/dL (ref 6–20)
CO2: 24 mmol/L (ref 22–32)
Calcium: 8.7 mg/dL — ABNORMAL LOW (ref 8.9–10.3)
Chloride: 108 mmol/L (ref 98–111)
Creatinine, Ser: 0.93 mg/dL (ref 0.61–1.24)
GFR, Estimated: >60 mL/min (ref >60)
Glucose, Bld: 95 mg/dL (ref 70–99)
Potassium: 4.1 mmol/L (ref 3.5–5.1)
Sodium: 139 mmol/L (ref 135–145)
Total Bilirubin: 0.3 mg/dL (ref 0.3–1.2)
Total Protein: 6.6 g/dL (ref 6.5–8.1)

## 2021-12-13 LAB — CBC
HCT: 45.9 % (ref 39.0–52.0)
Hemoglobin: 14.5 g/dL (ref 13.0–17.0)
MCH: 28.4 pg (ref 26.0–34.0)
MCHC: 31.6 g/dL (ref 30.0–36.0)
MCV: 90 fL (ref 80.0–100.0)
Platelets: 280 10*3/uL (ref 150–400)
RBC: 5.1 MIL/uL (ref 4.22–5.81)
RDW: 13.7 % (ref 11.5–15.5)
WBC: 5.7 10*3/uL (ref 4.0–10.5)
nRBC: 0 % (ref 0.0–0.2)

## 2021-12-13 NOTE — Progress Notes (Signed)
PCP - denies Cardiologist - denies  PPM/ICD - denies   Chest x-ray - 04/26/20 EKG - 04/27/20 Stress Test - denies ECHO - denies Cardiac Cath - denies  Sleep Study - denies   DM- denies  Last dose of GLP1 agonist-  n/a   ASA/Blood Thinner Instructions: n/a   ERAS Protcol - no, NPO   COVID TEST- n/a   Anesthesia review: no  Patient denies shortness of breath, fever, cough and chest pain at PAT appointment   All instructions explained to the patient, with a verbal understanding of the material. Patient agrees to go over the instructions while at home for a better understanding. The opportunity to ask questions was provided.

## 2021-12-14 NOTE — Anesthesia Preprocedure Evaluation (Signed)
Anesthesia Evaluation  Patient identified by MRN, date of birth, ID band Patient awake    Reviewed: Allergy & Precautions, NPO status , Patient's Chart, lab work & pertinent test results  History of Anesthesia Complications (+) history of anesthetic complications  Airway Mallampati: II  TM Distance: >3 FB Neck ROM: Full    Dental  (+) Missing,    Pulmonary Current Smoker,    Pulmonary exam normal        Cardiovascular negative cardio ROS Normal cardiovascular exam     Neuro/Psych Depression negative neurological ROS     GI/Hepatic negative GI ROS, Neg liver ROS,   Endo/Other  negative endocrine ROS  Renal/GU negative Renal ROS  negative genitourinary   Musculoskeletal negative musculoskeletal ROS (+)   Abdominal   Peds  Hematology negative hematology ROS (+)   Anesthesia Other Findings Day of surgery medications reviewed with patient.  Reproductive/Obstetrics negative OB ROS                            Anesthesia Physical Anesthesia Plan  ASA: 2  Anesthesia Plan: General   Post-op Pain Management: Tylenol PO (pre-op)*   Induction: Intravenous  PONV Risk Score and Plan: 2 and Ondansetron, Dexamethasone, Treatment may vary due to age or medical condition, Midazolam and Scopolamine patch - Pre-op  Airway Management Planned: Oral ETT  Additional Equipment: None  Intra-op Plan:   Post-operative Plan: Extubation in OR  Informed Consent: I have reviewed the patients History and Physical, chart, labs and discussed the procedure including the risks, benefits and alternatives for the proposed anesthesia with the patient or authorized representative who has indicated his/her understanding and acceptance.     Dental advisory given  Plan Discussed with: CRNA  Anesthesia Plan Comments:        Anesthesia Quick Evaluation
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
## 2021-12-15 ENCOUNTER — Encounter (HOSPITAL_COMMUNITY)
Admission: RE | Disposition: A | Discharge status: Home / Self Care | Payer: Self-pay | Source: Ambulatory Visit | Attending: Student

## 2021-12-15 ENCOUNTER — Inpatient Hospital Stay (HOSPITAL_COMMUNITY): Payer: Medicaid Other

## 2021-12-15 ENCOUNTER — Inpatient Hospital Stay (HOSPITAL_COMMUNITY): Payer: Medicaid Other | Admitting: Anesthesiology

## 2021-12-15 ENCOUNTER — Other Ambulatory Visit: Payer: Self-pay

## 2021-12-15 ENCOUNTER — Inpatient Hospital Stay (HOSPITAL_COMMUNITY)
Admission: RE | Admit: 2021-12-15 | Discharge: 2021-12-17 | DRG: 499 | Disposition: A | Discharge status: Home / Self Care | Payer: Medicaid Other | Source: Ambulatory Visit | Attending: Student | Admitting: Student

## 2021-12-15 ENCOUNTER — Encounter (HOSPITAL_COMMUNITY): Payer: Self-pay | Admitting: Student

## 2021-12-15 DIAGNOSIS — S32401A Unspecified fracture of right acetabulum, initial encounter for closed fracture: Secondary | ICD-10-CM | POA: Diagnosis not present

## 2021-12-15 DIAGNOSIS — M6100 Myositis ossificans traumatica, unspecified site: Secondary | ICD-10-CM | POA: Diagnosis not present

## 2021-12-15 DIAGNOSIS — Z79899 Other long term (current) drug therapy: Secondary | ICD-10-CM

## 2021-12-15 DIAGNOSIS — M24652 Ankylosis, left hip: Secondary | ICD-10-CM

## 2021-12-15 DIAGNOSIS — F32A Depression, unspecified: Secondary | ICD-10-CM | POA: Diagnosis not present

## 2021-12-15 DIAGNOSIS — M898X9 Other specified disorders of bone, unspecified site: Secondary | ICD-10-CM | POA: Diagnosis not present

## 2021-12-15 DIAGNOSIS — Z888 Allergy status to other drugs, medicaments and biological substances status: Secondary | ICD-10-CM | POA: Diagnosis not present

## 2021-12-15 DIAGNOSIS — F17210 Nicotine dependence, cigarettes, uncomplicated: Secondary | ICD-10-CM | POA: Diagnosis present

## 2021-12-15 DIAGNOSIS — S32422A Displaced fracture of posterior wall of left acetabulum, initial encounter for closed fracture: Secondary | ICD-10-CM | POA: Diagnosis not present

## 2021-12-15 DIAGNOSIS — Z96642 Presence of left artificial hip joint: Secondary | ICD-10-CM | POA: Diagnosis not present

## 2021-12-15 DIAGNOSIS — Z471 Aftercare following joint replacement surgery: Secondary | ICD-10-CM | POA: Diagnosis not present

## 2021-12-15 HISTORY — PX: HIP CAPSULECTOMY: SHX6711

## 2021-12-15 LAB — VITAMIN D 25 HYDROXY (VIT D DEFICIENCY, FRACTURES): Vit D, 25-Hydroxy: 4.97 ng/mL — ABNORMAL LOW (ref 30–100)

## 2021-12-15 SURGERY — CAPSULECTOMY, HIP
Anesthesia: General | Site: Hip | Laterality: Left

## 2021-12-15 MED ORDER — SODIUM CHLORIDE 0.9 % IR SOLN
Status: DC | PRN
  Administered 2021-12-15: 3000 mL

## 2021-12-15 MED ORDER — OXYCODONE HCL 5 MG PO TABS
10.0000 mg | ORAL_TABLET | ORAL | Status: DC | PRN
  Administered 2021-12-15 – 2021-12-17 (×5): 15 mg via ORAL
  Filled 2021-12-15 (×5): qty 3

## 2021-12-15 MED ORDER — 0.9 % SODIUM CHLORIDE (POUR BTL) OPTIME
TOPICAL | Status: DC | PRN
  Administered 2021-12-15: 1000 mL

## 2021-12-15 MED ORDER — DEXAMETHASONE SODIUM PHOSPHATE 10 MG/ML IJ SOLN
INTRAMUSCULAR | Status: DC | PRN
  Administered 2021-12-15: 10 mg via INTRAVENOUS

## 2021-12-15 MED ORDER — PHENYLEPHRINE 80 MCG/ML (10ML) SYRINGE FOR IV PUSH (FOR BLOOD PRESSURE SUPPORT)
PREFILLED_SYRINGE | INTRAVENOUS | Status: DC | PRN
  Administered 2021-12-15: 160 ug via INTRAVENOUS

## 2021-12-15 MED ORDER — FENTANYL CITRATE (PF) 250 MCG/5ML IJ SOLN
INTRAMUSCULAR | Status: DC | PRN
  Administered 2021-12-15 (×3): 50 ug via INTRAVENOUS
  Administered 2021-12-15: 100 ug via INTRAVENOUS
  Administered 2021-12-15 (×2): 50 ug via INTRAVENOUS

## 2021-12-15 MED ORDER — METOCLOPRAMIDE HCL 5 MG/ML IJ SOLN: 5.0000 mg | Freq: Three times a day (TID) | INTRAMUSCULAR | Status: DC | PRN

## 2021-12-15 MED ORDER — MIDAZOLAM HCL 2 MG/2ML IJ SOLN
INTRAMUSCULAR | Status: AC
  Filled 2021-12-15: qty 2

## 2021-12-15 MED ORDER — AMISULPRIDE (ANTIEMETIC) 5 MG/2ML IV SOLN: 10.0000 mg | Freq: Once | INTRAVENOUS | Status: DC | PRN

## 2021-12-15 MED ORDER — FENTANYL CITRATE (PF) 250 MCG/5ML IJ SOLN
INTRAMUSCULAR | Status: AC
  Filled 2021-12-15: qty 5

## 2021-12-15 MED ORDER — HYDROMORPHONE HCL 1 MG/ML IJ SOLN
0.2500 mg | INTRAMUSCULAR | Status: DC | PRN
  Administered 2021-12-15: 0.5 mg via INTRAVENOUS

## 2021-12-15 MED ORDER — CHLORHEXIDINE GLUCONATE 0.12 % MT SOLN
15.0000 mL | Freq: Once | OROMUCOSAL | Status: AC
  Administered 2021-12-15: 15 mL via OROMUCOSAL
  Filled 2021-12-15: qty 15

## 2021-12-15 MED ORDER — CEFAZOLIN SODIUM-DEXTROSE 2-4 GM/100ML-% IV SOLN
2.0000 g | INTRAVENOUS | Status: AC
  Administered 2021-12-15: 2 g via INTRAVENOUS
  Filled 2021-12-15: qty 100

## 2021-12-15 MED ORDER — METHOCARBAMOL 1000 MG/10ML IJ SOLN: 500.0000 mg | Freq: Four times a day (QID) | INTRAVENOUS | Status: DC | PRN

## 2021-12-15 MED ORDER — SODIUM CHLORIDE 0.9 % IV SOLN: INTRAVENOUS | Status: DC

## 2021-12-15 MED ORDER — ONDANSETRON HCL 4 MG/2ML IJ SOLN: 4.0000 mg | Freq: Four times a day (QID) | INTRAMUSCULAR | Status: DC | PRN

## 2021-12-15 MED ORDER — CEFAZOLIN SODIUM-DEXTROSE 2-4 GM/100ML-% IV SOLN
2.0000 g | Freq: Three times a day (TID) | INTRAVENOUS | Status: AC
  Administered 2021-12-15 – 2021-12-16 (×3): 2 g via INTRAVENOUS
  Filled 2021-12-15 (×3): qty 100

## 2021-12-15 MED ORDER — MIDAZOLAM HCL 2 MG/2ML IJ SOLN
INTRAMUSCULAR | Status: DC | PRN
  Administered 2021-12-15: 2 mg via INTRAVENOUS

## 2021-12-15 MED ORDER — METOCLOPRAMIDE HCL 5 MG PO TABS: 5.0000 mg | ORAL_TABLET | Freq: Three times a day (TID) | ORAL | Status: DC | PRN

## 2021-12-15 MED ORDER — PROPOFOL 10 MG/ML IV BOLUS
INTRAVENOUS | Status: DC | PRN
  Administered 2021-12-15: 200 mg via INTRAVENOUS

## 2021-12-15 MED ORDER — ROCURONIUM BROMIDE 10 MG/ML (PF) SYRINGE
PREFILLED_SYRINGE | INTRAVENOUS | Status: AC
  Filled 2021-12-15: qty 10

## 2021-12-15 MED ORDER — PHENYLEPHRINE 80 MCG/ML (10ML) SYRINGE FOR IV PUSH (FOR BLOOD PRESSURE SUPPORT)
PREFILLED_SYRINGE | INTRAVENOUS | Status: AC
  Filled 2021-12-15: qty 10

## 2021-12-15 MED ORDER — TRANEXAMIC ACID-NACL 1000-0.7 MG/100ML-% IV SOLN
1000.0000 mg | INTRAVENOUS | Status: AC
  Administered 2021-12-15: 1000 mg via INTRAVENOUS
  Filled 2021-12-15: qty 100

## 2021-12-15 MED ORDER — DEXMEDETOMIDINE HCL IN NACL 80 MCG/20ML IV SOLN
INTRAVENOUS | Status: DC | PRN
  Administered 2021-12-15 (×3): 4 ug via BUCCAL

## 2021-12-15 MED ORDER — ONDANSETRON HCL 4 MG/2ML IJ SOLN
INTRAMUSCULAR | Status: DC | PRN
  Administered 2021-12-15: 4 mg via INTRAVENOUS

## 2021-12-15 MED ORDER — LACTATED RINGERS IV SOLN: INTRAVENOUS | Status: DC | PRN

## 2021-12-15 MED ORDER — ROCURONIUM BROMIDE 10 MG/ML (PF) SYRINGE
PREFILLED_SYRINGE | INTRAVENOUS | Status: DC | PRN
  Administered 2021-12-15: 20 mg via INTRAVENOUS
  Administered 2021-12-15: 60 mg via INTRAVENOUS
  Administered 2021-12-15 (×3): 20 mg via INTRAVENOUS

## 2021-12-15 MED ORDER — PROPOFOL 10 MG/ML IV BOLUS
INTRAVENOUS | Status: AC
  Filled 2021-12-15: qty 20

## 2021-12-15 MED ORDER — ACETAMINOPHEN 325 MG PO TABS
325.0000 mg | ORAL_TABLET | Freq: Four times a day (QID) | ORAL | Status: DC | PRN
  Administered 2021-12-16: 650 mg via ORAL
  Filled 2021-12-15: qty 2

## 2021-12-15 MED ORDER — SUCCINYLCHOLINE CHLORIDE 200 MG/10ML IV SOSY
PREFILLED_SYRINGE | INTRAVENOUS | Status: AC
  Filled 2021-12-15: qty 10

## 2021-12-15 MED ORDER — SUGAMMADEX SODIUM 200 MG/2ML IV SOLN
INTRAVENOUS | Status: DC | PRN
  Administered 2021-12-15: 200 mg via INTRAVENOUS

## 2021-12-15 MED ORDER — EPHEDRINE 5 MG/ML INJ
INTRAVENOUS | Status: AC
  Filled 2021-12-15: qty 5

## 2021-12-15 MED ORDER — METHOCARBAMOL 500 MG PO TABS
500.0000 mg | ORAL_TABLET | Freq: Four times a day (QID) | ORAL | Status: DC | PRN
  Administered 2021-12-15 – 2021-12-17 (×4): 500 mg via ORAL
  Filled 2021-12-15 (×4): qty 1

## 2021-12-15 MED ORDER — KETOROLAC TROMETHAMINE 15 MG/ML IJ SOLN
15.0000 mg | Freq: Four times a day (QID) | INTRAMUSCULAR | Status: AC
  Administered 2021-12-15 – 2021-12-16 (×4): 15 mg via INTRAVENOUS
  Filled 2021-12-15 (×4): qty 1

## 2021-12-15 MED ORDER — LIDOCAINE 2% (20 MG/ML) 5 ML SYRINGE
INTRAMUSCULAR | Status: DC | PRN
  Administered 2021-12-15: 100 mg via INTRAVENOUS

## 2021-12-15 MED ORDER — VANCOMYCIN HCL 1000 MG IV SOLR
INTRAVENOUS | Status: AC
  Filled 2021-12-15: qty 20

## 2021-12-15 MED ORDER — SCOPOLAMINE 1 MG/3DAYS TD PT72
1.0000 | MEDICATED_PATCH | Freq: Once | TRANSDERMAL | Status: DC
  Administered 2021-12-15: 1.5 mg via TRANSDERMAL
  Filled 2021-12-15: qty 1

## 2021-12-15 MED ORDER — LACTATED RINGERS IV SOLN: INTRAVENOUS | Status: DC

## 2021-12-15 MED ORDER — OXYCODONE HCL 5 MG/5ML PO SOLN
ORAL | Status: AC
  Administered 2021-12-15: 5 mg
  Filled 2021-12-15: qty 5

## 2021-12-15 MED ORDER — ORAL CARE MOUTH RINSE: 15.0000 mL | Freq: Once | OROMUCOSAL | Status: AC

## 2021-12-15 MED ORDER — ONDANSETRON HCL 4 MG/2ML IJ SOLN
INTRAMUSCULAR | Status: AC
  Filled 2021-12-15: qty 2

## 2021-12-15 MED ORDER — POLYETHYLENE GLYCOL 3350 17 G PO PACK: 17.0000 g | PACK | Freq: Every day | ORAL | Status: DC | PRN

## 2021-12-15 MED ORDER — LIDOCAINE 2% (20 MG/ML) 5 ML SYRINGE
INTRAMUSCULAR | Status: AC
  Filled 2021-12-15: qty 5

## 2021-12-15 MED ORDER — OXYCODONE HCL 5 MG PO TABS: 5.0000 mg | ORAL_TABLET | ORAL | Status: DC | PRN

## 2021-12-15 MED ORDER — HYDRALAZINE HCL 10 MG PO TABS: 10.0000 mg | ORAL_TABLET | Freq: Four times a day (QID) | ORAL | Status: DC | PRN

## 2021-12-15 MED ORDER — ENOXAPARIN SODIUM 40 MG/0.4ML IJ SOSY
40.0000 mg | PREFILLED_SYRINGE | INTRAMUSCULAR | Status: DC
  Administered 2021-12-16 – 2021-12-17 (×2): 40 mg via SUBCUTANEOUS
  Filled 2021-12-15 (×2): qty 0.4

## 2021-12-15 MED ORDER — ONDANSETRON HCL 4 MG PO TABS: 4.0000 mg | ORAL_TABLET | Freq: Four times a day (QID) | ORAL | Status: DC | PRN

## 2021-12-15 MED ORDER — HYDROMORPHONE HCL 1 MG/ML IJ SOLN
0.5000 mg | INTRAMUSCULAR | Status: DC | PRN
  Administered 2021-12-16: 1 mg via INTRAVENOUS
  Filled 2021-12-15: qty 1

## 2021-12-15 MED ORDER — DEXAMETHASONE SODIUM PHOSPHATE 10 MG/ML IJ SOLN
INTRAMUSCULAR | Status: AC
  Filled 2021-12-15: qty 1

## 2021-12-15 MED ORDER — HYDROMORPHONE HCL 1 MG/ML IJ SOLN
INTRAMUSCULAR | Status: AC
  Filled 2021-12-15: qty 1

## 2021-12-15 MED ORDER — ACETAMINOPHEN 500 MG PO TABS
1000.0000 mg | ORAL_TABLET | Freq: Once | ORAL | Status: AC
  Administered 2021-12-15: 1000 mg via ORAL
  Filled 2021-12-15: qty 2

## 2021-12-15 MED ORDER — DOCUSATE SODIUM 100 MG PO CAPS
100.0000 mg | ORAL_CAPSULE | Freq: Two times a day (BID) | ORAL | Status: DC
  Administered 2021-12-15 – 2021-12-17 (×4): 100 mg via ORAL
  Filled 2021-12-15 (×4): qty 1

## 2021-12-15 SURGICAL SUPPLY — 59 items
ADH SKN CLS APL DERMABOND .7 (GAUZE/BANDAGES/DRESSINGS) ×1
BAG COUNTER SPONGE SURGICOUNT (BAG) ×2 IMPLANT
BAG SPNG CNTER NS LX DISP (BAG) ×1
BRUSH FEMORAL CANAL (MISCELLANEOUS) IMPLANT
BRUSH SCRUB EZ PLAIN DRY (MISCELLANEOUS) ×4 IMPLANT
COVER SURGICAL LIGHT HANDLE (MISCELLANEOUS) ×4 IMPLANT
DERMABOND ADVANCED .7 DNX12 (GAUZE/BANDAGES/DRESSINGS) IMPLANT
DRAPE C-ARM 42X72 X-RAY (DRAPES) IMPLANT
DRAPE C-ARMOR (DRAPES) ×2 IMPLANT
DRAPE IMP U-DRAPE 54X76 (DRAPES) ×2 IMPLANT
DRAPE INCISE IOBAN 66X45 STRL (DRAPES) IMPLANT
DRAPE ORTHO SPLIT 77X108 STRL (DRAPES) ×2
DRAPE SURG ORHT 6 SPLT 77X108 (DRAPES) ×4 IMPLANT
DRAPE U-SHAPE 47X51 STRL (DRAPES) ×2 IMPLANT
DRSG MEPILEX POST OP 4X12 (GAUZE/BANDAGES/DRESSINGS) IMPLANT
DRSG MEPILEX POST OP 4X8 (GAUZE/BANDAGES/DRESSINGS) IMPLANT
ELECT BLADE 6.5 EXT (BLADE) ×2 IMPLANT
ELECT CAUTERY BLADE 6.4 (BLADE) ×2 IMPLANT
ELECT REM PT RETURN 9FT ADLT (ELECTROSURGICAL) ×1
ELECTRODE REM PT RTRN 9FT ADLT (ELECTROSURGICAL) ×2 IMPLANT
EVACUATOR 1/8 PVC DRAIN (DRAIN) IMPLANT
GAUZE PAD ABD 8X10 STRL (GAUZE/BANDAGES/DRESSINGS) ×2 IMPLANT
GAUZE SPONGE 4X4 12PLY STRL (GAUZE/BANDAGES/DRESSINGS) ×2 IMPLANT
GAUZE XEROFORM 5X9 LF (GAUZE/BANDAGES/DRESSINGS) ×2 IMPLANT
GLOVE BIO SURGEON STRL SZ7.5 (GLOVE) ×2 IMPLANT
GLOVE BIO SURGEON STRL SZ8 (GLOVE) ×2 IMPLANT
GLOVE BIOGEL PI IND STRL 7.5 (GLOVE) ×2 IMPLANT
GLOVE BIOGEL PI IND STRL 8 (GLOVE) ×2 IMPLANT
GLOVE SURG ORTHO LTX SZ7.5 (GLOVE) ×4 IMPLANT
GOWN STRL REUS W/ TWL XL LVL3 (GOWN DISPOSABLE) ×2 IMPLANT
GOWN STRL REUS W/TWL 2XL LVL3 (GOWN DISPOSABLE) ×6 IMPLANT
GOWN STRL REUS W/TWL XL LVL3 (GOWN DISPOSABLE) ×1
HANDPIECE INTERPULSE COAX TIP (DISPOSABLE)
KIT TURNOVER KIT B (KITS) ×2 IMPLANT
MANIFOLD NEPTUNE II (INSTRUMENTS) ×2 IMPLANT
NDL 22X1.5 STRL (OR ONLY) (MISCELLANEOUS) IMPLANT
NEEDLE 22X1.5 STRL (OR ONLY) (MISCELLANEOUS) IMPLANT
NS IRRIG 1000ML POUR BTL (IV SOLUTION) ×2 IMPLANT
PACK TOTAL JOINT (CUSTOM PROCEDURE TRAY) ×2 IMPLANT
PACK UNIVERSAL I (CUSTOM PROCEDURE TRAY) ×2 IMPLANT
PAD ARMBOARD 7.5X6 YLW CONV (MISCELLANEOUS) ×4 IMPLANT
PILLOW ABDUCTION MEDIUM (MISCELLANEOUS) IMPLANT
SET HNDPC FAN SPRY TIP SCT (DISPOSABLE) IMPLANT
SPONGE T-LAP 18X18 ~~LOC~~+RFID (SPONGE) IMPLANT
STAPLER VISISTAT 35W (STAPLE) ×2 IMPLANT
SUT ETHILON 3 0 PS 1 (SUTURE) IMPLANT
SUT VIC AB 0 CT1 27 (SUTURE) ×1
SUT VIC AB 0 CT1 27XBRD ANBCTR (SUTURE) ×4 IMPLANT
SUT VIC AB 1 CT1 27 (SUTURE) ×1
SUT VIC AB 1 CT1 27XBRD ANBCTR (SUTURE) ×2 IMPLANT
SUT VIC AB 2-0 CT1 27 (SUTURE) ×1
SUT VIC AB 2-0 CT1 TAPERPNT 27 (SUTURE) ×2 IMPLANT
SYR CONTROL 10ML LL (SYRINGE) ×2 IMPLANT
TOWEL GREEN STERILE (TOWEL DISPOSABLE) ×4 IMPLANT
TOWEL GREEN STERILE FF (TOWEL DISPOSABLE) ×2 IMPLANT
TOWER CARTRIDGE SMART MIX (DISPOSABLE) IMPLANT
TRAY FOLEY MTR SLVR 16FR STAT (SET/KITS/TRAYS/PACK) IMPLANT
WATER STERILE IRR 1000ML POUR (IV SOLUTION) ×8 IMPLANT
YANKAUER SUCT BULB TIP NO VENT (SUCTIONS) IMPLANT

## 2021-12-15 NOTE — Anesthesia Procedure Notes (Addendum)
Procedure Name: Intubation Date/Time: 12/15/2021 7:39 AM  Performed by: Thelma Comp, CRNAPre-anesthesia Checklist: Patient identified, Emergency Drugs available, Suction available and Patient being monitored Patient Re-evaluated:Patient Re-evaluated prior to induction Oxygen Delivery Method: Circle System Utilized Preoxygenation: Pre-oxygenation with 100% oxygen Induction Type: IV induction Ventilation: Mask ventilation without difficulty Laryngoscope Size: Mac and 4 Grade View: Grade II Tube type: Oral Tube size: 7.5 mm Number of attempts: 1 Airway Equipment and Method: Stylet and Oral airway Placement Confirmation: ETT inserted through vocal cords under direct vision, positive ETCO2 and breath sounds checked- equal and bilateral Secured at: 22 cm Tube secured with: Tape Dental Injury: Teeth and Oropharynx as per pre-operative assessment  Comments: K Venrick SRNA performed

## 2021-12-15 NOTE — Transfer of Care (Signed)
Immediate Anesthesia Transfer of Care Note  Patient: Jeremy George.  Procedure(s) Performed: REMOVAL OF HETEROTOPIC OSSIFICATION LEFT HIP (Left: Hip)  Patient Location: PACU  Anesthesia Type:General  Level of Consciousness: drowsy and patient cooperative  Airway & Oxygen Therapy: Patient Spontanous Breathing  Post-op Assessment: Report given to RN and Post -op Vital signs reviewed and stable  Post vital signs: Reviewed and stable  Last Vitals:  Vitals Value Taken Time  BP 126/78 12/15/21 1034  Temp    Pulse 74 12/15/21 1036  Resp 15 12/15/21 1036  SpO2 92 % 12/15/21 1036  Vitals shown include unvalidated device data.  Last Pain:  Vitals:   12/15/21 0622  TempSrc:   PainSc: 0-No pain      Patients Stated Pain Goal: 0 (12/11/09 7356)  Complications: No notable events documented.
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
## 2021-12-15 NOTE — Interval H&P Note (Signed)
History and Physical Interval Note:  12/15/2021 7:22 AM  Jeremy George.  has presented today for surgery, with the diagnosis of Heterotopic ossification.  The various methods of treatment have been discussed with the patient and family. After consideration of risks, benefits and other options for treatment, the patient has consented to  Procedure(s) with comments: LEFT HIP HETEROTOPIC OSSIFICATION REMOVAL (Left) - 3 hrs as a surgical intervention.  The patient's history has been reviewed, patient examined, no change in status, stable for surgery.  I have reviewed the patient's chart and labs.  Questions were answered to the patient's satisfaction.     Lennette Bihari P Makenzie Weisner

## 2021-12-15 NOTE — Anesthesia Postprocedure Evaluation (Signed)
Anesthesia Post Note  Patient: Jeremy George.  Procedure(s) Performed: REMOVAL OF HETEROTOPIC OSSIFICATION LEFT HIP (Left: Hip)     Patient location during evaluation: PACU Anesthesia Type: General Level of consciousness: awake and alert Pain management: pain level controlled Vital Signs Assessment: post-procedure vital signs reviewed and stable Respiratory status: spontaneous breathing, nonlabored ventilation and respiratory function stable Cardiovascular status: blood pressure returned to baseline Postop Assessment: no apparent nausea or vomiting Anesthetic complications: no   No notable events documented.  Last Vitals:  Vitals:   12/15/21 1050 12/15/21 1105  BP: 121/70 126/83  Pulse: 67 67  Resp: 11 13  Temp:  36.8 C  SpO2: 95% 94%    Last Pain:  Vitals:   12/15/21 1105  TempSrc:   PainSc: 0-No pain                 Marthenia Rolling

## 2021-12-16 ENCOUNTER — Encounter: Payer: Self-pay | Admitting: Radiation Oncology

## 2021-12-16 DIAGNOSIS — M898X9 Other specified disorders of bone, unspecified site: Secondary | ICD-10-CM | POA: Diagnosis not present

## 2021-12-16 LAB — BASIC METABOLIC PANEL
Anion gap: 8 (ref 5–15)
BUN: 7 mg/dL (ref 6–20)
CO2: 25 mmol/L (ref 22–32)
Calcium: 8.6 mg/dL — ABNORMAL LOW (ref 8.9–10.3)
Chloride: 104 mmol/L (ref 98–111)
Creatinine, Ser: 0.91 mg/dL (ref 0.61–1.24)
GFR, Estimated: >60 mL/min (ref >60)
Glucose, Bld: 107 mg/dL — ABNORMAL HIGH (ref 70–99)
Potassium: 3.7 mmol/L (ref 3.5–5.1)
Sodium: 137 mmol/L (ref 135–145)

## 2021-12-16 LAB — CBC
HCT: 38.2 % — ABNORMAL LOW (ref 39.0–52.0)
Hemoglobin: 13.1 g/dL (ref 13.0–17.0)
MCH: 29.6 pg (ref 26.0–34.0)
MCHC: 34.3 g/dL (ref 30.0–36.0)
MCV: 86.4 fL (ref 80.0–100.0)
Platelets: 261 10*3/uL (ref 150–400)
RBC: 4.42 MIL/uL (ref 4.22–5.81)
RDW: 13.9 % (ref 11.5–15.5)
WBC: 10.4 10*3/uL (ref 4.0–10.5)
nRBC: 0 % (ref 0.0–0.2)

## 2021-12-16 MED ORDER — VITAMIN D (ERGOCALCIFEROL) 1.25 MG (50000 UNIT) PO CAPS
50000.0000 [IU] | ORAL_CAPSULE | ORAL | Status: DC
  Administered 2021-12-16: 50000 [IU] via ORAL
  Filled 2021-12-16 (×2): qty 1

## 2021-12-16 NOTE — Progress Notes (Signed)
Spoke  with [facility]; pt for scheduled tranport at 7:30am to [facility] (Dr. Lisbeth Renshaw) for a radiation treatment.

## 2021-12-16 NOTE — Progress Notes (Signed)
Spoke with Jeremy George, to let him know that we have scheduled the patient for a radiation treatment tomorrow at 0800.  I asked that he call [facility] [phone]) to have him here at the [facility] at 7:45 am.  We ask that  the patient have some pain medication prior to transport.  Gloriajean Dell. Leonie Green, BSN

## 2021-12-16 NOTE — Progress Notes (Signed)
Orthopaedic Trauma Progress Note  SUBJECTIVE: Doing well this morning.  Pain controlled.  Able to move the hip much better than preoperatively.  Is eager to work with therapies and get up and walking.  No chest pain. No SOB. No nausea/vomiting. No other complaints.   OBJECTIVE:  Vitals:   12/16/21 0606 12/16/21 0742  BP: 129/78 125/68  Pulse: 65 65  Resp: 16 18  Temp: 97.9 F (36.6 C) 98.6 F (37 C)  SpO2: 100% 99%    General: Sitting up in bed, no acute distress Respiratory: No increased work of breathing.  Left lower extremity: Dressing clean, dry, intact.  Tenderness about the posterior hip as expected.  Tolerates hip and knee flexion with some discomfort in the hip.  Ankle DF/PF intact.  IMAGING: Stable post op imaging.   LABS:  Results for orders placed or performed during the hospital encounter of 12/15/21 (from the past 24 hour(s))  VITAMIN D 25 Hydroxy (Vit-D Deficiency, Fractures)     Status: Abnormal   Collection Time: 12/15/21  6:46 PM  Result Value Ref Range   Vit D, 25-Hydroxy 4.97 (L) 30 - 100 ng/mL  CBC     Status: Abnormal   Collection Time: 12/16/21  3:56 AM  Result Value Ref Range   WBC 10.4 4.0 - 10.5 K/uL   RBC 4.42 4.22 - 5.81 MIL/uL   Hemoglobin 13.1 13.0 - 17.0 g/dL   HCT 42.5 (L) 95.6 - 38.7 %   MCV 86.4 80.0 - 100.0 fL   MCH 29.6 26.0 - 34.0 pg   MCHC 34.3 30.0 - 36.0 g/dL   RDW 56.4 33.2 - 95.1 %   Platelets 261 150 - 400 K/uL   nRBC 0.0 0.0 - 0.2 %  Basic metabolic panel     Status: Abnormal   Collection Time: 12/16/21  3:56 AM  Result Value Ref Range   Sodium 137 135 - 145 mmol/L   Potassium 3.7 3.5 - 5.1 mmol/L   Chloride 104 98 - 111 mmol/L   CO2 25 22 - 32 mmol/L   Glucose, Bld 107 (H) 70 - 99 mg/dL   BUN 7 6 - 20 mg/dL   Creatinine, Ser 8.84 0.61 - 1.24 mg/dL   Calcium 8.6 (L) 8.9 - 10.3 mg/dL   GFR, Estimated >16 >60 mL/min   Anion gap 8 5 - 15    ASSESSMENT: Jeremy George. is a 26 y.o. male, 1 Day Post-Op REMOVAL OF  HETEROTOPIC OSSIFICATION LEFT HIP  CV/Blood loss: Hemoglobin 13.1 this morning.  Relatively stable from preop.  Hemodynamically stable  PLAN: Weightbearing: WBAT LLE ROM: Unrestricted hip and knee motion Incisional and dressing care: Reinforce dressings as needed  Showering: Okay to begin getting incisions wet 12/18/2021 Orthopedic device(s): None  Pain management:  1. Tylenol 325-650 mg q 6 hours PRN 2. Robaxin 500 mg q 6 hours PRN 3. Oxycodone 5-15 mg q 4 hours PRN 4. Dilaudid 0.5-1 mg q 4 hours PRN 5. Toradol 15 mg q 6 hours PRN VTE prophylaxis: Lovenox, SCDs ID:  Ancef 2gm post op Foley/Lines:  No foley, KVO IVFs Impediments to Fracture Healing: Vitamin D level 4, started on D2 supplementation Dispo: Therapy evaluation today.  Waiting for scheduled time to be transported to [facility] for XRT.  Likely discharge home tomorrow 12/17/2021   D/C recommendations: -Oxycodone and Robaxin for pain control -Aspirin 325 mg daily x30 days for DVT prophylaxis -Continue 50,000 units weekly Vit D supplementation  Follow - up plan:  2 weeks after discharge for wound check and repeat x-rays   Contact information:  Katha Hamming MD, Rushie Nyhan PA-C. After hours and holidays please check [email or web address] for group call information for [facility]   Gwinda Passe, PA-C [phone] (office) [email or web address]
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
## 2021-12-17 ENCOUNTER — Encounter: Payer: Self-pay | Admitting: Radiation Oncology

## 2021-12-17 ENCOUNTER — Ambulatory Visit
Admit: 2021-12-17 | Discharge: 2021-12-17 | Disposition: A | Discharge status: Home / Self Care | Payer: Medicaid Other | Attending: Radiation Oncology | Admitting: Radiation Oncology

## 2021-12-17 ENCOUNTER — Encounter (HOSPITAL_COMMUNITY): Payer: Self-pay | Admitting: Student

## 2021-12-17 ENCOUNTER — Other Ambulatory Visit (HOSPITAL_COMMUNITY): Payer: Self-pay

## 2021-12-17 ENCOUNTER — Other Ambulatory Visit: Payer: Self-pay

## 2021-12-17 DIAGNOSIS — M2580 Other specified joint disorders, unspecified joint: Secondary | ICD-10-CM

## 2021-12-17 DIAGNOSIS — M898X9 Other specified disorders of bone, unspecified site: Secondary | ICD-10-CM | POA: Diagnosis not present

## 2021-12-17 DIAGNOSIS — Z510 Encounter for antineoplastic radiation therapy: Secondary | ICD-10-CM | POA: Diagnosis not present

## 2021-12-17 LAB — RAD ONC ARIA SESSION SUMMARY
Course Elapsed Days: 0
Plan Fractions Treated to Date: 1
Plan Total Fractions Prescribed: 1
Session Number: 1

## 2021-12-17 LAB — CBC
HCT: 36.9 % — ABNORMAL LOW (ref 39.0–52.0)
Hemoglobin: 12 g/dL — ABNORMAL LOW (ref 13.0–17.0)
MCH: 28.3 pg (ref 26.0–34.0)
MCHC: 32.5 g/dL (ref 30.0–36.0)
MCV: 87 fL (ref 80.0–100.0)
Platelets: 214 10*3/uL (ref 150–400)
RBC: 4.24 MIL/uL (ref 4.22–5.81)
RDW: 14 % (ref 11.5–15.5)
WBC: 6.7 10*3/uL (ref 4.0–10.5)
nRBC: 0 % (ref 0.0–0.2)

## 2021-12-17 MED ORDER — METHOCARBAMOL 500 MG PO TABS
500.0000 mg | ORAL_TABLET | Freq: Four times a day (QID) | ORAL | 0 refills | Status: DC | PRN
  Filled 2021-12-17: qty 20, 5d supply, fill #0

## 2021-12-17 MED ORDER — OXYCODONE HCL 5 MG PO TABS
5.0000 mg | ORAL_TABLET | ORAL | 0 refills | Status: DC | PRN
  Filled 2021-12-17: qty 30, 5d supply, fill #0

## 2021-12-17 MED ORDER — ASPIRIN 325 MG PO TABS
325.0000 mg | ORAL_TABLET | Freq: Every day | ORAL | 0 refills | Status: AC
  Filled 2021-12-17: qty 30, 30d supply, fill #0

## 2021-12-17 MED ORDER — VITAMIN D (ERGOCALCIFEROL) 1.25 MG (50000 UNIT) PO CAPS
50000.0000 [IU] | ORAL_CAPSULE | ORAL | 0 refills | Status: DC
  Filled 2021-12-17: qty 8, 56d supply, fill #0

## 2021-12-17 NOTE — Progress Notes (Signed)
OT Cancellation Note  Patient Details Name: Jeremy George. MRN: [id number] DOB: [birth year]   Cancelled Treatment:      Pt greeted EOB on phone, Offered OT tx reviewing tub transfer in prep for DC home, however pt on the phone and politely declines stating, "No I feel good about it. Thank you."  Lowella Dell [facility] 12/17/2021, 10:35 AM
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
## 2021-12-17 NOTE — Progress Notes (Signed)
Patient received by [facility] for transport to [facility]

## 2021-12-17 NOTE — Discharge Instructions (Addendum)
Orthopaedic Trauma Service Discharge Instructions   General Discharge Instructions  WEIGHT BEARING STATUS:weightbearing as tolerated  RANGE OF MOTION/ACTIVITY: ok for hip motion as tolerated  Wound Care:You may remove your dressing post-op day day #3 (Saturday 12/18/21). Incisions can be left open to air if there is no drainage. If incision continues to have drainage, follow wound care instructions below. Okay to shower if no drainage from incisions.  DVT/PE prophylaxis: Aspirin daily x 30 days  Diet: as you were eating previously.  Can use over the counter stool softeners and bowel preparations, such as Miralax, to help with bowel movements.  Narcotics can be constipating.  Be sure to drink plenty of fluids  PAIN MEDICATION USE AND EXPECTATIONS  You have likely been given narcotic medications to help control your pain.  After a traumatic event that results in an fracture (broken bone) with or without surgery, it is ok to use narcotic pain medications to help control one's pain.  We understand that everyone responds to pain differently and each individual patient will be evaluated on a regular basis for the continued need for narcotic medications. Ideally, narcotic medication use should last no more than 6-8 weeks (coinciding with fracture healing).   As a patient it is your responsibility as well to monitor narcotic medication use and report the amount and frequency you use these medications when you come to your office visit.   We would also advise that if you are using narcotic medications, you should take a dose prior to therapy to maximize you participation.  IF YOU ARE ON NARCOTIC MEDICATIONS IT IS NOT PERMISSIBLE TO OPERATE A MOTOR VEHICLE (MOTORCYCLE/CAR/TRUCK/MOPED) OR HEAVY MACHINERY DO NOT MIX NARCOTICS WITH OTHER CNS (CENTRAL NERVOUS SYSTEM) DEPRESSANTS SUCH AS ALCOHOL   STOP SMOKING OR USING NICOTINE PRODUCTS!!!!  As discussed nicotine severely impairs your body's ability to  heal surgical and traumatic wounds but also impairs bone healing.  Wounds and bone heal by forming microscopic blood vessels (angiogenesis) and nicotine is a vasoconstrictor (essentially, shrinks blood vessels).  Therefore, if vasoconstriction occurs to these microscopic blood vessels they essentially disappear and are unable to deliver necessary nutrients to the healing tissue.  This is one modifiable factor that you can do to dramatically increase your chances of healing your injury.    (This means no smoking, no nicotine gum, patches, etc)  DO NOT USE NONSTEROIDAL ANTI-INFLAMMATORY DRUGS (NSAID'S)  Using products such as Advil (ibuprofen), Aleve (naproxen), Motrin (ibuprofen) for additional pain control during fracture healing can delay and/or prevent the healing response.  If you would like to take over the counter (OTC) medication, Tylenol (acetaminophen) is ok.  However, some narcotic medications that are given for pain control contain acetaminophen as well. Therefore, you should not exceed more than 4000 mg of tylenol in a day if you do not have liver disease.  Also note that there are may OTC medicines, such as cold medicines and allergy medicines that my contain tylenol as well.  If you have any questions about medications and/or interactions please ask your doctor/PA or your pharmacist.      ICE AND ELEVATE INJURED/OPERATIVE EXTREMITY  Using ice and elevating the injured extremity above your heart can help with swelling and pain control.  Icing in a pulsatile fashion, such as 20 minutes on and 20 minutes off, can be followed.    Do not place ice directly on skin. Make sure there is a barrier between to skin and the ice pack.  Using frozen items such as frozen peas works well as the conform nicely to the are that needs to be iced.  USE AN ACE WRAP OR TED HOSE FOR SWELLING CONTROL  In addition to icing and elevation, Ace wraps or TED hose are used to help limit and resolve swelling.  It is  recommended to use Ace wraps or TED hose until you are informed to stop.    When using Ace Wraps start the wrapping distally (farthest away from the body) and wrap proximally (closer to the body)   Example: If you had surgery on your leg or thing and you do not have a splint on, start the ace wrap at the toes and work your way up to the thigh        If you had surgery on your upper extremity and do not have a splint on, start the ace wrap at your fingers and work your way up to the upper arm   [place]: [phone]   VISIT OUR WEBSITE FOR ADDITIONAL INFORMATION: [email or web address]    Discharge Wound Care Instructions  Do NOT apply any ointments, solutions or lotions to pin sites or surgical wounds.  These prevent needed drainage and even though solutions like hydrogen peroxide kill bacteria, they also damage cells lining the pin sites that help fight infection.  Applying lotions or ointments can keep the wounds moist and can cause them to breakdown and open up as well. This can increase the risk for infection. When in doubt call the office.   If any drainage is noted, use a foam dressing (Mepilex) - These dressing supplies should be available at local medical supply stores [place], [facility], etc) as well as Management consultant ([facility], [facility], [employer], etc)  Once the incision is completely dry and without drainage, it may be left open to air out.  Showering may begin 36-48 hours later.  Cleaning gently with soap and water.

## 2021-12-17 NOTE — Discharge Summary (Signed)
Orthopaedic Trauma Service (OTS) Discharge Summary   Patient ID: Jeremy George. MRN: [id number] DOB/AGE: [birth year] 26 y.o.  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  Was taken to the operating room by Dr. Doreatha Martin for the above procedure.  He tolerated this well without complications.  Was admitted to the orthopedic service postoperatively for pain control, therapies, and to schedule XRT for prevention of further heterotopic ossification.  Patient began working with physical and occupational therapy starting on postoperative day #1.  He was started on Lovenox for DVT prophylaxis starting on postoperative day #1.  Patient transferred to [facility] on 12/17/2021 for XRT.  He tolerated this well. On 12/17/2021, the patient was tolerating diet, working well with therapies, pain well controlled, vital signs stable, dressings clean, dry, intact and felt stable for discharge to home. Patient will follow up as below and knows to call with questions or concerns.     Consults:  Radiation oncology  Significant Diagnostic Studies:   Results for orders placed or performed during the hospital encounter of 12/15/21 (from the past 168 hour(s))  VITAMIN D 25 Hydroxy (Vit-D Deficiency, Fractures)   Collection Time: 12/15/21  6:46 PM  Result Value Ref Range   Vit D, 25-Hydroxy 4.97 (L) 30 - 100 ng/mL  CBC   Collection Time: 12/16/21  3:56 AM  Result Value Ref Range   WBC 10.4 4.0 - 10.5 K/uL   RBC 4.42 4.22 - 5.81 MIL/uL    Hemoglobin 13.1 13.0 - 17.0 g/dL   HCT 38.2 (L) 39.0 - 52.0 %   MCV 86.4 80.0 - 100.0 fL   MCH 29.6 26.0 - 34.0 pg   MCHC 34.3 30.0 - 36.0 g/dL   RDW 13.9 11.5 - 15.5 %   Platelets 261 150 - 400 K/uL   nRBC 0.0 0.0 - 0.2 %  Basic metabolic panel   Collection Time: 12/16/21  3:56 AM  Result Value Ref Range   Sodium 137 135 - 145 mmol/L   Potassium 3.7 3.5 - 5.1 mmol/L   Chloride 104 98 - 111 mmol/L   CO2 25 22 - 32 mmol/L   Glucose, Bld 107 (H) 70 - 99 mg/dL   BUN 7 6 - 20 mg/dL   Creatinine, Ser 0.91 0.61 - 1.24 mg/dL   Calcium 8.6 (L) 8.9 - 10.3 mg/dL   GFR, Estimated >60 >60 mL/min   Anion gap 8 5 - 15  CBC   Collection Time: 12/17/21  4:53 AM  Result Value Ref Range   WBC 6.7 4.0 - 10.5 K/uL   RBC 4.24 4.22 - 5.81 MIL/uL   Hemoglobin 12.0 (L) 13.0 - 17.0 g/dL   HCT 36.9 (L) 39.0 - 52.0 %   MCV 87.0 80.0 - 100.0 fL   MCH 28.3 26.0 - 34.0 pg   MCHC 32.5 30.0 - 36.0 g/dL   RDW 14.0 11.5 - 15.5 %   Platelets 214 150 - 400 K/uL   nRBC 0.0 0.0 - 0.2 %  Results for orders placed or performed in visit on 12/17/21 (from the past 168 hour(s))  Rad Jeralene Huff Session Summary   Collection Time: 12/17/21  8:51 AM  Result Value Ref Range   Course ID [id number]    Course Start Date 12/17/2021    Session Number 1    Course First Treatment Date 12/17/2021  8:48 AM    Course Last Treatment Date 12/17/2021  8:50 AM    Course Elapsed Days 0    Plan ID Left Hip    Plan Fractions Treated to Date 1    Plan Total Fractions Prescribed 1   Results for orders placed or performed during the hospital encounter of 12/13/21 (from the past 168 hour(s))  Comprehensive metabolic panel per protocol   Collection Time: 12/13/21 10:00 AM  Result Value Ref Range   Sodium 139 135 - 145 mmol/L   Potassium 4.1 3.5 - 5.1 mmol/L   Chloride 108 98 - 111 mmol/L   CO2 24 22 - 32 mmol/L   Glucose, Bld 95 70 - 99 mg/dL   BUN 9 6 - 20 mg/dL   Creatinine, Ser 4.33 0.61 - 1.24 mg/dL   Calcium 8.7 (L) 8.9 - 10.3 mg/dL    Total Protein 6.6 6.5 - 8.1 g/dL   Albumin 3.3 (L) 3.5 - 5.0 g/dL   AST 16 15 - 41 U/L   ALT 13 0 - 44 U/L   Alkaline Phosphatase 96 38 - 126 U/L   Total Bilirubin 0.3 0.3 - 1.2 mg/dL   GFR, Estimated >29 >51 mL/min   Anion gap 7 5 - 15  CBC per protocol   Collection Time: 12/13/21 10:00 AM  Result Value Ref Range   WBC 5.7 4.0 - 10.5 K/uL   RBC 5.10 4.22 - 5.81 MIL/uL   Hemoglobin 14.5 13.0 - 17.0 g/dL   HCT 88.4 16.6 - 06.3 %   MCV 90.0 80.0 - 100.0 fL   MCH 28.4 26.0 - 34.0 pg   MCHC 31.6 30.0 - 36.0 g/dL   RDW 01.6 01.0 - 93.2 %   Platelets 280 150 - 400 K/uL   nRBC 0.0 0.0 - 0.2 %     Treatments: IV hydration, antibiotics: Ancef, analgesia: acetaminophen, Dilaudid, and oxycodone, anticoagulation: LMW heparin, therapies: PT and OT, and surgery: As above  Discharge Exam:  General: Sitting up on edge of bed, no acute distress Respiratory: No increased work of breathing.  Left lower extremity: Dressing with small amount of bloody drainage.  Dressing removed.  Incision itself is clean, dry, intact with no active drainage.  New dressing applied.  Tenderness about the posterior hip as expected.  Tolerates hip and knee flexion with some discomfort in the hip but overall much improved from preoperatively.  Ankle DF/PF intact.  Foot warm and well-perfused.  Compartment soft and compressible.  Disposition: Discharge disposition: 01-Home or Self Care       Discharge Instructions     Ambulatory referral to Physical Therapy   Complete by: As directed    Call MD / Call 911   Complete by: As directed    If you experience chest pain or shortness of breath, CALL 911 and be transported to the hospital emergency room.  If you develope a fever above 101 F, pus (white drainage) or increased drainage or redness at the wound, or calf pain, call your surgeon's office.   Constipation Prevention   Complete by: As directed    Drink plenty of fluids.  Prune juice may be helpful.  You may  use a stool softener, such as Colace (over  the counter) 100 mg twice a day.  Use MiraLax (over the counter) for constipation as needed.   Diet - low sodium heart healthy   Complete by: As directed    Increase activity slowly as tolerated   Complete by: As directed    Post-operative opioid taper instructions:   Complete by: As directed    POST-OPERATIVE OPIOID TAPER INSTRUCTIONS: It is important to wean off of your opioid medication as soon as possible. If you do not need pain medication after your surgery it is ok to stop day one. Opioids include: Codeine, Hydrocodone(Norco, Vicodin), Oxycodone(Percocet, oxycontin) and hydromorphone amongst others.  Long term and even short term use of opiods can cause: Increased pain response Dependence Constipation Depression Respiratory depression And more.  Withdrawal symptoms can include Flu like symptoms Nausea, vomiting And more Techniques to manage these symptoms Hydrate well Eat regular healthy meals Stay active Use relaxation techniques(deep breathing, meditating, yoga) Do Not substitute Alcohol to help with tapering If you have been on opioids for less than two weeks and do not have pain than it is ok to stop all together.  Plan to wean off of opioids This plan should start within one week post op of your joint replacement. Maintain the same interval or time between taking each dose and first decrease the dose.  Cut the total daily intake of opioids by one tablet each day Next start to increase the time between doses. The last dose that should be eliminated is the evening dose.         Allergies as of 12/17/2021       Reactions   Tramadol Hives        Medication List     STOP taking these medications    citalopram 10 MG tablet Commonly known as: CeleXA   divalproex 500 MG DR tablet Commonly known as: Depakote   QUEtiapine 100 MG tablet Commonly known as: SEROquel       TAKE these medications    aspirin 325  MG tablet Take 1 tablet (325 mg total) by mouth daily.   methocarbamol 500 MG tablet Commonly known as: ROBAXIN Take 1 tablet (500 mg total) by mouth every 6 (six) hours as needed for muscle spasms.   oxyCODONE 5 MG immediate release tablet Commonly known as: Oxy IR/ROXICODONE Take 1 tablet (5 mg total) by mouth every 4 (four) hours as needed for severe pain.   Vitamin D (Ergocalciferol) 1.25 MG (50000 UNIT) Caps capsule Commonly known as: DRISDOL Take 1 capsule (50,000 Units total) by mouth every 7 (seven) days. Start taking on: December 23, 2021        Follow-up Information     Haddix, Gillie Manners, MD. Schedule an appointment as soon as possible for a visit in 2 week(s).   Specialty: Orthopedic Surgery Why: for wound check and repeat x-rays Contact information: [address] [place] [address] [phone]                 Discharge Instructions and Plan: Patient will be discharged to home.  We will continue weightbearing as tolerated on the left lower extremity.  Referral for outpatient physical therapy has been provided.  Will be discharged on Aspirin 325 mg daily x30 days for DVT prophylaxis. Patient has all the necessary DME for discharge. Patient will follow up with Dr. [name] in 2 weeks for repeat x-rays and suture removal.   Signed:  Thompson Caul, PA-C ?[phone]? (phone) 12/17/2021, 10:24 AM  [facility] [address] [place] [address] [phone] (F)

## 2021-12-20 ENCOUNTER — Telehealth: Payer: Self-pay

## 2021-12-20 NOTE — Progress Notes (Signed)
Inpt consult 10/26

## 2021-12-20 NOTE — Patient Outreach (Signed)
Care Coordination  12/20/2021  Jeremy George Nov 13, 1995 [id number]   Transition Care Management Unsuccessful Follow-up Telephone Call  Date of discharge and from where:  12/17/21 [facility]  Attempts:  1st Attempt  Reason for unsuccessful TCM follow-up call:  Unable to leave message   Mickel Fuchs, BSW, [place] Medicaid Team  [phone]
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
## 2021-12-22 DIAGNOSIS — Z419 Encounter for procedure for purposes other than remedying health state, unspecified: Secondary | ICD-10-CM | POA: Diagnosis not present

## 2021-12-25 ENCOUNTER — Ambulatory Visit: Payer: Medicaid Other | Attending: Student

## 2021-12-25 ENCOUNTER — Other Ambulatory Visit: Payer: Self-pay

## 2021-12-25 DIAGNOSIS — S32422A Displaced fracture of posterior wall of left acetabulum, initial encounter for closed fracture: Secondary | ICD-10-CM | POA: Insufficient documentation

## 2021-12-25 DIAGNOSIS — M6281 Muscle weakness (generalized): Secondary | ICD-10-CM | POA: Diagnosis not present

## 2021-12-25 DIAGNOSIS — R262 Difficulty in walking, not elsewhere classified: Secondary | ICD-10-CM | POA: Insufficient documentation

## 2021-12-25 DIAGNOSIS — M898X9 Other specified disorders of bone, unspecified site: Secondary | ICD-10-CM | POA: Insufficient documentation

## 2021-12-25 DIAGNOSIS — M25552 Pain in left hip: Secondary | ICD-10-CM | POA: Diagnosis not present
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
## 2021-12-28 ENCOUNTER — Encounter: Payer: Self-pay | Admitting: Physical Therapy

## 2021-12-28 ENCOUNTER — Ambulatory Visit: Payer: Medicaid Other | Admitting: Physical Therapy

## 2021-12-28 ENCOUNTER — Other Ambulatory Visit: Payer: Self-pay

## 2021-12-28 DIAGNOSIS — R262 Difficulty in walking, not elsewhere classified: Secondary | ICD-10-CM

## 2021-12-28 DIAGNOSIS — M25552 Pain in left hip: Secondary | ICD-10-CM

## 2021-12-28 DIAGNOSIS — M898X9 Other specified disorders of bone, unspecified site: Secondary | ICD-10-CM | POA: Diagnosis not present

## 2021-12-28 DIAGNOSIS — S32422A Displaced fracture of posterior wall of left acetabulum, initial encounter for closed fracture: Secondary | ICD-10-CM | POA: Diagnosis not present

## 2021-12-28 DIAGNOSIS — M6281 Muscle weakness (generalized): Secondary | ICD-10-CM

## 2021-12-28 NOTE — Patient Instructions (Signed)
Access Code: [email or web address] URL: [email or web address] Date: 12/28/2021 Prepared by: Hilda Blades  Exercises - MINI* Squat with Resistance at Thighs  - 1 x daily - 7 x weekly - 3 sets - 10 reps - Hooklying Clamshell with Resistance  - 1 x daily - 7 x weekly - 3 sets - 10 reps - 3 seconds hold - Supine Active Straight Leg Raise  - 1 x daily - 3 sets - 10 reps - 3 seconds hold - Modified Thomas Stretch  - 1 x daily - 3 reps - 1 minute hold - Bridge  - 1 x daily - 3 sets - 10 reps - Prone Hip Extension  - 1 x daily - 3 sets - 10 reps - Supine Hip Adduction Isometric with Ball  - 1 x daily - 7 x weekly - 3 sets - 10 reps - 3 seconds hold - Supine Double Knee to Chest  - 1 x daily - 7 x weekly - 3 sets - 30 seconds hold

## 2021-12-28 NOTE — Progress Notes (Signed)
  Radiation Oncology         [phone]) [phone] ________________________________  Name: Jeremy George. MRN: [id number]  Date: 12/17/2021  DOB: November 21, 1995  End of Treatment Note  Diagnosis:   Postoperative heterotopic ossification     Indication for treatment:  curative       Radiation treatment dates:   12/17/21  Site/dose:   The patient was treated to a dose of 7 Gy to the left hip. This was accomplished with AP and PA fields.  Narrative: The patient tolerated radiation treatment relatively well.   No difficulties.  Plan: The patient has completed radiation treatment. The patient will return to radiation oncology clinic on an as needed basis and follow up with Dr. Doreatha Martin. ________________________________     Carola Rhine, PAC

## 2021-12-30 ENCOUNTER — Other Ambulatory Visit: Payer: Self-pay

## 2021-12-30 ENCOUNTER — Ambulatory Visit: Payer: Medicaid Other | Admitting: Physical Therapy

## 2021-12-30 ENCOUNTER — Encounter: Payer: Self-pay | Admitting: Physical Therapy

## 2021-12-30 DIAGNOSIS — R262 Difficulty in walking, not elsewhere classified: Secondary | ICD-10-CM | POA: Diagnosis not present

## 2021-12-30 DIAGNOSIS — M6281 Muscle weakness (generalized): Secondary | ICD-10-CM

## 2021-12-30 DIAGNOSIS — M898X9 Other specified disorders of bone, unspecified site: Secondary | ICD-10-CM | POA: Diagnosis not present

## 2021-12-30 DIAGNOSIS — S32422A Displaced fracture of posterior wall of left acetabulum, initial encounter for closed fracture: Secondary | ICD-10-CM | POA: Diagnosis not present

## 2021-12-30 DIAGNOSIS — M25552 Pain in left hip: Secondary | ICD-10-CM
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
## 2022-01-03 DIAGNOSIS — M6100 Myositis ossificans traumatica, unspecified site: Secondary | ICD-10-CM | POA: Diagnosis not present

## 2022-01-03 DIAGNOSIS — S32422D Displaced fracture of posterior wall of left acetabulum, subsequent encounter for fracture with routine healing: Secondary | ICD-10-CM | POA: Diagnosis not present

## 2022-01-04 ENCOUNTER — Encounter: Payer: Self-pay | Admitting: Physical Therapy

## 2022-01-04 ENCOUNTER — Ambulatory Visit: Payer: Medicaid Other | Admitting: Physical Therapy

## 2022-01-04 ENCOUNTER — Other Ambulatory Visit: Payer: Self-pay

## 2022-01-04 DIAGNOSIS — M25552 Pain in left hip: Secondary | ICD-10-CM

## 2022-01-04 DIAGNOSIS — M6281 Muscle weakness (generalized): Secondary | ICD-10-CM | POA: Diagnosis not present

## 2022-01-04 DIAGNOSIS — M898X9 Other specified disorders of bone, unspecified site: Secondary | ICD-10-CM | POA: Diagnosis not present

## 2022-01-04 DIAGNOSIS — R262 Difficulty in walking, not elsewhere classified: Secondary | ICD-10-CM

## 2022-01-04 DIAGNOSIS — S32422A Displaced fracture of posterior wall of left acetabulum, initial encounter for closed fracture: Secondary | ICD-10-CM | POA: Diagnosis not present
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
## 2022-01-06 ENCOUNTER — Ambulatory Visit: Payer: Medicaid Other | Admitting: Physical Therapy

## 2022-01-06 ENCOUNTER — Encounter: Payer: Self-pay | Admitting: Physical Therapy

## 2022-01-06 ENCOUNTER — Other Ambulatory Visit: Payer: Self-pay

## 2022-01-06 DIAGNOSIS — R262 Difficulty in walking, not elsewhere classified: Secondary | ICD-10-CM | POA: Diagnosis not present

## 2022-01-06 DIAGNOSIS — M25552 Pain in left hip: Secondary | ICD-10-CM

## 2022-01-06 DIAGNOSIS — S32422A Displaced fracture of posterior wall of left acetabulum, initial encounter for closed fracture: Secondary | ICD-10-CM | POA: Diagnosis not present

## 2022-01-06 DIAGNOSIS — M6281 Muscle weakness (generalized): Secondary | ICD-10-CM | POA: Diagnosis not present

## 2022-01-06 DIAGNOSIS — M898X9 Other specified disorders of bone, unspecified site: Secondary | ICD-10-CM | POA: Diagnosis not present
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
## 2022-01-11 ENCOUNTER — Encounter: Payer: Self-pay | Admitting: Physical Therapy

## 2022-01-11 ENCOUNTER — Ambulatory Visit: Payer: Medicaid Other | Admitting: Physical Therapy

## 2022-01-11 ENCOUNTER — Other Ambulatory Visit: Payer: Self-pay

## 2022-01-11 DIAGNOSIS — R262 Difficulty in walking, not elsewhere classified: Secondary | ICD-10-CM | POA: Diagnosis not present

## 2022-01-11 DIAGNOSIS — M25552 Pain in left hip: Secondary | ICD-10-CM

## 2022-01-11 DIAGNOSIS — S32422A Displaced fracture of posterior wall of left acetabulum, initial encounter for closed fracture: Secondary | ICD-10-CM | POA: Diagnosis not present

## 2022-01-11 DIAGNOSIS — M6281 Muscle weakness (generalized): Secondary | ICD-10-CM

## 2022-01-11 DIAGNOSIS — M898X9 Other specified disorders of bone, unspecified site: Secondary | ICD-10-CM | POA: Diagnosis not present

## 2022-01-11 NOTE — Patient Instructions (Signed)
Access Code: [email or web address] URL: [email or web address] Date: 01/11/2022 Prepared by: Rosana Hoes  Exercises - [place] Pian on Table  - 2 x daily - 3 reps - 60 seconds hold - Hooklying Clamshell with Resistance  - 1 x daily - 3 sets - 10 reps - 3 seconds hold - Supine Active Straight Leg Raise  - 1 x daily - 3 sets - 10 reps - Bridge  - 1 x daily - 3 sets - 10 reps - Quadruped Rocking Backward  - 2 x daily - 10 reps - 5 seconds hold - Quadruped Alternating Leg Extensions  - 1 x daily - 3 sets - 10 reps - MINI* Squat with Resistance at Thighs  - 1 x daily - 3 sets - 10 reps

## 2022-01-18 ENCOUNTER — Ambulatory Visit: Payer: Medicaid Other | Admitting: Physical Therapy

## 2022-01-18 ENCOUNTER — Other Ambulatory Visit: Payer: Self-pay

## 2022-01-18 ENCOUNTER — Encounter: Payer: Self-pay | Admitting: Physical Therapy

## 2022-01-18 DIAGNOSIS — R262 Difficulty in walking, not elsewhere classified: Secondary | ICD-10-CM

## 2022-01-18 DIAGNOSIS — M25552 Pain in left hip: Secondary | ICD-10-CM | POA: Diagnosis not present

## 2022-01-18 DIAGNOSIS — M898X9 Other specified disorders of bone, unspecified site: Secondary | ICD-10-CM | POA: Diagnosis not present

## 2022-01-18 DIAGNOSIS — M6281 Muscle weakness (generalized): Secondary | ICD-10-CM

## 2022-01-18 DIAGNOSIS — S32422A Displaced fracture of posterior wall of left acetabulum, initial encounter for closed fracture: Secondary | ICD-10-CM | POA: Diagnosis not present
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
## 2022-01-21 DIAGNOSIS — Z419 Encounter for procedure for purposes other than remedying health state, unspecified: Secondary | ICD-10-CM | POA: Diagnosis not present

## 2022-01-24 ENCOUNTER — Ambulatory Visit: Payer: Medicaid Other | Admitting: Family Medicine

## 2022-01-24 ENCOUNTER — Ambulatory Visit: Payer: Medicaid Other
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
## 2022-01-27 ENCOUNTER — Other Ambulatory Visit: Payer: Self-pay

## 2022-01-27 ENCOUNTER — Encounter: Payer: Self-pay | Admitting: Physical Therapy

## 2022-01-27 ENCOUNTER — Ambulatory Visit: Payer: Medicaid Other | Attending: Student | Admitting: Physical Therapy

## 2022-01-27 DIAGNOSIS — M25552 Pain in left hip: Secondary | ICD-10-CM | POA: Diagnosis not present

## 2022-01-27 DIAGNOSIS — R262 Difficulty in walking, not elsewhere classified: Secondary | ICD-10-CM | POA: Diagnosis not present

## 2022-01-27 DIAGNOSIS — M6281 Muscle weakness (generalized): Secondary | ICD-10-CM | POA: Insufficient documentation
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
## 2022-02-01 ENCOUNTER — Ambulatory Visit: Payer: Medicaid Other | Admitting: Physical Therapy

## 2022-02-01 ENCOUNTER — Other Ambulatory Visit: Payer: Self-pay

## 2022-02-01 ENCOUNTER — Encounter: Payer: Self-pay | Admitting: Physical Therapy

## 2022-02-01 DIAGNOSIS — M25552 Pain in left hip: Secondary | ICD-10-CM | POA: Diagnosis not present

## 2022-02-01 DIAGNOSIS — M6281 Muscle weakness (generalized): Secondary | ICD-10-CM

## 2022-02-01 DIAGNOSIS — R262 Difficulty in walking, not elsewhere classified: Secondary | ICD-10-CM

## 2022-02-01 NOTE — Patient Instructions (Signed)
Access Code: [email or web address] URL: [email or web address] Date: 02/01/2022 Prepared by: Rosana Hoes  Exercises - Er[place] Pian on Table  - 2 x daily - 3 reps - 60 seconds hold - Supine Hip Internal and External Rotation  - 2 x daily - 10 reps - Hooklying Clamshell with Resistance  - 1 x daily - 3 sets - 10 reps - 3 seconds hold - Supine Active Straight Leg Raise  - 1 x daily - 3 sets - 10 reps - Bridge  - 1 x daily - 3 sets - 10 reps - Quadruped Rocking Backward  - 2 x daily - 10 reps - 5 seconds hold - Quadruped Alternating Leg Extensions  - 1 x daily - 3 sets - 10 reps - MINI* Squat with Resistance at Thighs  - 1 x daily - 3 sets - 10 reps
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
## 2022-02-03 ENCOUNTER — Encounter: Payer: Self-pay | Admitting: Physical Therapy

## 2022-02-03 ENCOUNTER — Ambulatory Visit: Payer: Medicaid Other | Admitting: Physical Therapy

## 2022-02-03 ENCOUNTER — Other Ambulatory Visit: Payer: Self-pay

## 2022-02-03 DIAGNOSIS — M6281 Muscle weakness (generalized): Secondary | ICD-10-CM

## 2022-02-03 DIAGNOSIS — M25552 Pain in left hip: Secondary | ICD-10-CM

## 2022-02-03 DIAGNOSIS — R262 Difficulty in walking, not elsewhere classified: Secondary | ICD-10-CM
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
## 2022-02-08 ENCOUNTER — Other Ambulatory Visit: Payer: Self-pay

## 2022-02-08 ENCOUNTER — Encounter: Payer: Self-pay | Admitting: Physical Therapy

## 2022-02-08 ENCOUNTER — Ambulatory Visit: Payer: Medicaid Other | Admitting: Physical Therapy

## 2022-02-08 DIAGNOSIS — M6281 Muscle weakness (generalized): Secondary | ICD-10-CM

## 2022-02-08 DIAGNOSIS — M25552 Pain in left hip: Secondary | ICD-10-CM | POA: Diagnosis not present

## 2022-02-08 DIAGNOSIS — R262 Difficulty in walking, not elsewhere classified: Secondary | ICD-10-CM | POA: Diagnosis not present
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
## 2022-02-16 ENCOUNTER — Ambulatory Visit: Payer: Medicaid Other | Admitting: Physical Therapy

## 2022-02-18 ENCOUNTER — Ambulatory Visit: Payer: Medicaid Other

## 2022-02-18 ENCOUNTER — Ambulatory Visit: Payer: Medicaid Other | Admitting: Family Medicine

## 2022-02-18 ENCOUNTER — Encounter: Payer: Self-pay | Admitting: Family Medicine

## 2022-02-18 DIAGNOSIS — Z113 Encounter for screening for infections with a predominantly sexual mode of transmission: Secondary | ICD-10-CM | POA: Diagnosis not present

## 2022-02-18 LAB — HM HIV SCREENING LAB: HM HIV Screening: NEGATIVE
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
## 2022-02-21 DIAGNOSIS — Z419 Encounter for procedure for purposes other than remedying health state, unspecified: Secondary | ICD-10-CM | POA: Diagnosis not present

## 2022-02-22 LAB — CHLAMYDIA/GC NAA, CONFIRMATION
Chlamydia trachomatis, NAA: NEGATIVE
Neisseria gonorrhoeae, NAA: NEGATIVE
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
## 2022-02-23 ENCOUNTER — Ambulatory Visit: Payer: Medicaid Other | Attending: Student | Admitting: Physical Therapy

## 2022-02-23 ENCOUNTER — Other Ambulatory Visit: Payer: Self-pay

## 2022-02-23 ENCOUNTER — Encounter: Payer: Self-pay | Admitting: Physical Therapy

## 2022-02-23 DIAGNOSIS — R262 Difficulty in walking, not elsewhere classified: Secondary | ICD-10-CM | POA: Diagnosis not present

## 2022-02-23 DIAGNOSIS — M25552 Pain in left hip: Secondary | ICD-10-CM | POA: Diagnosis not present

## 2022-02-23 DIAGNOSIS — M6281 Muscle weakness (generalized): Secondary | ICD-10-CM | POA: Diagnosis not present

## 2022-02-23 NOTE — Patient Instructions (Signed)
Access Code: [email or web address] URL: [email or web address] Date: 02/23/2022 Prepared by: Hilda Blades  Exercises - [employer] on Table  - 2 x daily - 3 reps - 60 seconds hold - Supine Hip Internal and External Rotation  - 2 x daily - 10 reps - Hooklying Clamshell with Resistance  - 1 x daily - 3 sets - 10 reps - Supine Active Straight Leg Raise  - 1 x daily - 3 sets - 10 reps - Single Leg Bridge  - 1 x daily - 3 sets - 10 reps - Sidelying Hip Abduction  - 1 x daily - 3 sets - 10 reps - Quadruped Rocking Backward  - 2 x daily - 10 reps - 5 seconds hold - Quadruped Alternating Leg Extensions  - 1 x daily - 3 sets - 10 reps - Squat  - 1 x daily - 3 sets - 10 reps

## 2022-03-02 ENCOUNTER — Encounter: Payer: Medicaid Other | Admitting: Physical Medicine & Rehabilitation

## 2022-03-16 ENCOUNTER — Encounter: Payer: Self-pay | Admitting: Physical Medicine & Rehabilitation

## 2022-03-16 ENCOUNTER — Encounter: Payer: Medicaid Other | Attending: Physical Medicine & Rehabilitation | Admitting: Physical Medicine & Rehabilitation

## 2022-03-24 DIAGNOSIS — Z419 Encounter for procedure for purposes other than remedying health state, unspecified: Secondary | ICD-10-CM | POA: Diagnosis not present

## 2022-04-05 DIAGNOSIS — S42412D Displaced simple supracondylar fracture without intercondylar fracture of left humerus, subsequent encounter for fracture with routine healing: Secondary | ICD-10-CM | POA: Diagnosis not present

## 2022-04-05 DIAGNOSIS — M6100 Myositis ossificans traumatica, unspecified site: Secondary | ICD-10-CM | POA: Diagnosis not present

## 2022-04-05 DIAGNOSIS — S32422D Displaced fracture of posterior wall of left acetabulum, subsequent encounter for fracture with routine healing: Secondary | ICD-10-CM | POA: Diagnosis not present

## 2022-04-19 DIAGNOSIS — S32422D Displaced fracture of posterior wall of left acetabulum, subsequent encounter for fracture with routine healing: Secondary | ICD-10-CM | POA: Diagnosis not present

## 2022-04-19 DIAGNOSIS — S42412D Displaced simple supracondylar fracture without intercondylar fracture of left humerus, subsequent encounter for fracture with routine healing: Secondary | ICD-10-CM | POA: Diagnosis not present

## 2022-04-22 DIAGNOSIS — Z419 Encounter for procedure for purposes other than remedying health state, unspecified: Secondary | ICD-10-CM | POA: Diagnosis not present

## 2022-05-23 DIAGNOSIS — Z419 Encounter for procedure for purposes other than remedying health state, unspecified: Secondary | ICD-10-CM | POA: Diagnosis not present

## 2022-05-25 ENCOUNTER — Ambulatory Visit: Payer: Medicaid Other | Admitting: Physical Medicine & Rehabilitation

## 2022-06-22 DIAGNOSIS — Z419 Encounter for procedure for purposes other than remedying health state, unspecified: Secondary | ICD-10-CM | POA: Diagnosis not present

## 2022-07-23 DIAGNOSIS — Z419 Encounter for procedure for purposes other than remedying health state, unspecified: Secondary | ICD-10-CM | POA: Diagnosis not present

## 2022-08-22 DIAGNOSIS — Z419 Encounter for procedure for purposes other than remedying health state, unspecified: Secondary | ICD-10-CM | POA: Diagnosis not present

## 2022-08-31 ENCOUNTER — Encounter: Payer: Self-pay | Admitting: Physical Medicine & Rehabilitation

## 2022-08-31 ENCOUNTER — Encounter: Payer: Medicaid Other | Attending: Physical Medicine & Rehabilitation | Admitting: Physical Medicine & Rehabilitation

## 2022-08-31 VITALS — BP 111/74 | HR 63 | Ht 74.0 in | Wt 178.0 lb

## 2022-08-31 DIAGNOSIS — S069XAS Unspecified intracranial injury with loss of consciousness status unknown, sequela: Secondary | ICD-10-CM | POA: Diagnosis not present

## 2022-08-31 DIAGNOSIS — M898X9 Other specified disorders of bone, unspecified site: Secondary | ICD-10-CM | POA: Diagnosis not present
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
## 2022-08-31 NOTE — Patient Instructions (Signed)
ALWAYS FEEL FREE TO CALL OUR OFFICE WITH ANY PROBLEMS OR QUESTIONS ([phone])  **PLEASE NOTE** ALL MEDICATION REFILL REQUESTS (INCLUDING CONTROLLED SUBSTANCES) NEED TO BE MADE AT LEAST 7 DAYS PRIOR TO REFILL BEING DUE. ANY REFILL REQUESTS INSIDE THAT TIME FRAME MAY RESULT IN DELAYS IN RECEIVING YOUR PRESCRIPTION.                    

## 2022-09-22 DIAGNOSIS — Z419 Encounter for procedure for purposes other than remedying health state, unspecified: Secondary | ICD-10-CM | POA: Diagnosis not present

## 2022-10-23 DIAGNOSIS — Z419 Encounter for procedure for purposes other than remedying health state, unspecified: Secondary | ICD-10-CM | POA: Diagnosis not present

## 2022-12-23 DIAGNOSIS — Z419 Encounter for procedure for purposes other than remedying health state, unspecified: Secondary | ICD-10-CM | POA: Diagnosis not present

## 2023-01-22 DIAGNOSIS — Z419 Encounter for procedure for purposes other than remedying health state, unspecified: Secondary | ICD-10-CM | POA: Diagnosis not present

## 2023-02-22 DIAGNOSIS — Z419 Encounter for procedure for purposes other than remedying health state, unspecified: Secondary | ICD-10-CM | POA: Diagnosis not present

## 2023-03-25 DIAGNOSIS — Z419 Encounter for procedure for purposes other than remedying health state, unspecified: Secondary | ICD-10-CM | POA: Diagnosis not present

## 2023-04-22 DIAGNOSIS — Z419 Encounter for procedure for purposes other than remedying health state, unspecified: Secondary | ICD-10-CM | POA: Diagnosis not present

## 2023-06-12 ENCOUNTER — Other Ambulatory Visit: Payer: Self-pay | Admitting: Student

## 2023-06-12 DIAGNOSIS — M79622 Pain in left upper arm: Secondary | ICD-10-CM

## 2023-06-12 DIAGNOSIS — S42412D Displaced simple supracondylar fracture without intercondylar fracture of left humerus, subsequent encounter for fracture with routine healing: Secondary | ICD-10-CM | POA: Diagnosis not present

## 2023-06-19 ENCOUNTER — Ambulatory Visit
Admission: RE | Admit: 2023-06-19 | Discharge: 2023-06-19 | Disposition: A | Discharge status: Home / Self Care | Source: Ambulatory Visit | Attending: Student | Admitting: Student

## 2023-06-19 DIAGNOSIS — M79622 Pain in left upper arm: Secondary | ICD-10-CM | POA: Insufficient documentation

## 2023-06-19 DIAGNOSIS — S52002K Unspecified fracture of upper end of left ulna, subsequent encounter for closed fracture with nonunion: Secondary | ICD-10-CM | POA: Diagnosis not present

## 2023-06-19 DIAGNOSIS — S42352A Displaced comminuted fracture of shaft of humerus, left arm, initial encounter for closed fracture: Secondary | ICD-10-CM | POA: Diagnosis not present

## 2023-07-03 DIAGNOSIS — Z419 Encounter for procedure for purposes other than remedying health state, unspecified: Secondary | ICD-10-CM | POA: Diagnosis not present

## 2023-08-03 DIAGNOSIS — Z419 Encounter for procedure for purposes other than remedying health state, unspecified: Secondary | ICD-10-CM | POA: Diagnosis not present

## 2023-08-14 DIAGNOSIS — S42412D Displaced simple supracondylar fracture without intercondylar fracture of left humerus, subsequent encounter for fracture with routine healing: Secondary | ICD-10-CM | POA: Diagnosis not present

## 2023-08-22 ENCOUNTER — Ambulatory Visit: Payer: Self-pay | Admitting: Student

## 2023-08-28 ENCOUNTER — Other Ambulatory Visit: Payer: Self-pay

## 2023-08-28 ENCOUNTER — Encounter (HOSPITAL_COMMUNITY): Payer: Self-pay | Admitting: Student

## 2023-08-28 NOTE — Progress Notes (Signed)
 PCP - denies Cardiologist - denies  PPM/ICD - denies   Chest x-ray - 04/26/20 EKG -  Stress Test - denies ECHO - denies Cardiac Cath - denies  CPAP - denies  DM- denies  ASA/Blood Thinner Instructions: denies   ERAS Protcol - no, NPO  COVID TEST- n/a  Anesthesia review: no  Patient verbally denies any shortness of breath, fever, cough and chest pain during phone call      Questions were answered. Patient verbalized understanding of instructions.

## 2023-08-28 NOTE — Pre-Procedure Instructions (Signed)
-------------    SDW INSTRUCTIONS given:  Your procedure is scheduled on 7/9.  Report to [facility] Main Entrance A at 06:30 A.M., and check in at the Admitting office.  Any questions or running late day of surgery: call [phone]    Remember:  Do not eat or drink after midnight the night before your surgery    Take these medicines the morning of surgery with A SIP OF WATER    May take these medicines IF NEEDED: cetirizine (ZYRTEC)    As of today, STOP taking any Aspirin  (unless otherwise instructed by your surgeon) Aleve, Naproxen, Ibuprofen , Motrin , Advil , Goody's, BC's, all herbal medications, fish oil, and all vitamins.   Do NOT Smoke (Tobacco/Vaping) 24 hours prior to your procedure  If you use a CPAP at night, you may bring all equipment for your overnight stay.     You will be asked to remove any contacts, glasses, piercing's, hearing aid's, dentures/partials prior to surgery. Please bring cases for these items if needed.     Patients discharged the day of surgery will not be allowed to drive home, and someone needs to stay with them for 24 hours.  SURGICAL WAITING ROOM VISITATION Patients may have no more than 2 support people in the waiting area - these visitors may rotate.   Pre-op nurse will coordinate an appropriate time for 1 ADULT support person, who may not rotate, to accompany patient in pre-op.  Children under the age of 49 must have an adult with them who is not the patient and must remain in the main waiting area with an adult.  If the patient needs to stay at the hospital during part of their recovery, the visitor guidelines for inpatient rooms apply.  Please refer to the [facility] website for the visitor guidelines for any additional information.   Special instructions:   [facility]- Preparing For Surgery   Please follow these instructions carefully.   Shower the NIGHT BEFORE SURGERY and the MORNING OF SURGERY with DIAL Soap.   Pat yourself dry  with a CLEAN TOWEL.  Wear CLEAN PAJAMAS to bed the night before surgery  Place CLEAN SHEETS on your bed the night of your first shower and DO NOT SLEEP WITH PETS.   Additional instructions for the day of surgery: DO NOT APPLY any lotions, deodorants, cologne, or perfumes.   Do not wear jewelry or makeup Do not wear nail polish, gel polish, artificial nails, or any other type of covering on natural nails (fingers and toes) Do not bring valuables to the hospital. [facility] is not responsible for valuables/personal belongings. Put on clean/comfortable clothes.  Please brush your teeth.  Ask your nurse before applying any prescription medications to the skin.

## 2023-09-02 DIAGNOSIS — Z419 Encounter for procedure for purposes other than remedying health state, unspecified: Secondary | ICD-10-CM | POA: Diagnosis not present

## 2023-09-18 ENCOUNTER — Other Ambulatory Visit: Payer: Self-pay

## 2023-09-18 ENCOUNTER — Encounter (HOSPITAL_COMMUNITY): Payer: Self-pay | Admitting: Student

## 2023-09-18 NOTE — Progress Notes (Signed)
 SDW CALL  Patient was given pre-op instructions over the phone. The opportunity was given for the patient to ask questions. No further questions asked. Patient verbalized understanding of instructions given.   PCP - denies Cardiologist - denies  PPM/ICD - denies   Chest x-ray - denies EKG - denies Stress Test - denies ECHO - denies Cardiac Cath - denies  Sleep Study - denies  No DM  Last dose of GLP1 agonist-  n/a GLP1 instructions:  n/a  Blood Thinner Instructions: n/a Aspirin  Instructions: n/a  ERAS Protcol - NPO   COVID TEST- no   Anesthesia review: no  Patient denies shortness of breath, fever, cough and chest pain over the phone call   All instructions explained to the patient, with a verbal understanding of the material. Patient agrees to go over the instructions while at home for a better understanding.
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
## 2023-09-19 NOTE — Anesthesia Preprocedure Evaluation (Signed)
 Anesthesia Evaluation  Patient identified by MRN, date of birth, ID band Patient awake    Reviewed: Allergy & Precautions, H&P , NPO status , Patient's Chart, lab work & pertinent test results  Airway Mallampati: II  TM Distance: >3 FB Neck ROM: Full    Dental no notable dental hx. (+)    Pulmonary Current Smoker   Pulmonary exam normal breath sounds clear to auscultation       Cardiovascular (-) hypertension(-) Past MI negative cardio ROS Normal cardiovascular exam Rhythm:Regular Rate:Normal     Neuro/Psych  PSYCHIATRIC DISORDERS  Depression    negative neurological ROS     GI/Hepatic Neg liver ROS,,,Hx of PEG tube   Endo/Other  negative endocrine ROS    Renal/GU negative Renal ROS  negative genitourinary   Musculoskeletal   Abdominal   Peds negative pediatric ROS (+)  Hematology negative hematology ROS (+)   Anesthesia Other Findings Poly trauma 03/2020  s/p ORIF Left open distal humerus 03/2020 with subsequent repair of nonunion with RIA autograft 05/15/2020  Reproductive/Obstetrics negative OB ROS                              Anesthesia Physical Anesthesia Plan  ASA: 2  Anesthesia Plan: General   Post-op Pain Management: Tylenol  PO (pre-op)*   Induction: Intravenous  PONV Risk Score and Plan: 1 and Ondansetron , Dexamethasone , Midazolam  and Treatment may vary due to age or medical condition  Airway Management Planned: Oral ETT  Additional Equipment: None  Intra-op Plan:   Post-operative Plan: Extubation in OR  Informed Consent: I have reviewed the patients History and Physical, chart, labs and discussed the procedure including the risks, benefits and alternatives for the proposed anesthesia with the patient or authorized representative who has indicated his/her understanding and acceptance.     Dental advisory given  Plan Discussed with: CRNA  Anesthesia Plan  Comments:          Anesthesia Quick Evaluation

## 2023-09-20 ENCOUNTER — Emergency Department (HOSPITAL_COMMUNITY)
Admission: EM | Admit: 2023-09-20 | Discharge: 2023-09-20 | Discharge status: Against Medical Advice | Attending: Emergency Medicine | Admitting: Emergency Medicine

## 2023-09-20 ENCOUNTER — Ambulatory Visit (HOSPITAL_COMMUNITY): Payer: Self-pay | Admitting: Anesthesiology

## 2023-09-20 ENCOUNTER — Other Ambulatory Visit: Payer: Self-pay

## 2023-09-20 ENCOUNTER — Ambulatory Visit (HOSPITAL_COMMUNITY)
Admission: RE | Admit: 2023-09-20 | Discharge: 2023-09-20 | Disposition: A | Discharge status: Home / Self Care | Attending: Student | Admitting: Student

## 2023-09-20 ENCOUNTER — Encounter (HOSPITAL_COMMUNITY): Payer: Self-pay | Admitting: *Deleted

## 2023-09-20 ENCOUNTER — Encounter (HOSPITAL_COMMUNITY)
Admission: RE | Disposition: A | Discharge status: Home / Self Care | Payer: Self-pay | Source: Home / Self Care | Attending: Student

## 2023-09-20 ENCOUNTER — Ambulatory Visit (HOSPITAL_COMMUNITY)

## 2023-09-20 ENCOUNTER — Encounter (HOSPITAL_COMMUNITY): Payer: Self-pay | Admitting: Student

## 2023-09-20 DIAGNOSIS — S42362A Displaced segmental fracture of shaft of humerus, left arm, initial encounter for closed fracture: Secondary | ICD-10-CM

## 2023-09-20 DIAGNOSIS — Y831 Surgical operation with implant of artificial internal device as the cause of abnormal reaction of the patient, or of later complication, without mention of misadventure at the time of the procedure: Secondary | ICD-10-CM | POA: Insufficient documentation

## 2023-09-20 DIAGNOSIS — F1290 Cannabis use, unspecified, uncomplicated: Secondary | ICD-10-CM | POA: Insufficient documentation

## 2023-09-20 DIAGNOSIS — M96831 Postprocedural hemorrhage and hematoma of a musculoskeletal structure following other procedure: Secondary | ICD-10-CM | POA: Diagnosis not present

## 2023-09-20 DIAGNOSIS — Z9889 Other specified postprocedural states: Secondary | ICD-10-CM

## 2023-09-20 DIAGNOSIS — T8484XA Pain due to internal orthopedic prosthetic devices, implants and grafts, initial encounter: Secondary | ICD-10-CM | POA: Insufficient documentation

## 2023-09-20 DIAGNOSIS — L7622 Postprocedural hemorrhage and hematoma of skin and subcutaneous tissue following other procedure: Secondary | ICD-10-CM | POA: Insufficient documentation

## 2023-09-20 DIAGNOSIS — Z5321 Procedure and treatment not carried out due to patient leaving prior to being seen by health care provider: Secondary | ICD-10-CM | POA: Insufficient documentation

## 2023-09-20 DIAGNOSIS — M25622 Stiffness of left elbow, not elsewhere classified: Secondary | ICD-10-CM | POA: Diagnosis not present

## 2023-09-20 DIAGNOSIS — Z472 Encounter for removal of internal fixation device: Secondary | ICD-10-CM | POA: Diagnosis not present

## 2023-09-20 DIAGNOSIS — F17210 Nicotine dependence, cigarettes, uncomplicated: Secondary | ICD-10-CM | POA: Diagnosis not present

## 2023-09-20 DIAGNOSIS — S42412B Displaced simple supracondylar fracture without intercondylar fracture of left humerus, initial encounter for open fracture: Secondary | ICD-10-CM | POA: Diagnosis not present

## 2023-09-20 DIAGNOSIS — T85848A Pain due to other internal prosthetic devices, implants and grafts, initial encounter: Secondary | ICD-10-CM | POA: Diagnosis present

## 2023-09-20 HISTORY — PX: HARDWARE REMOVAL: SHX979

## 2023-09-20 HISTORY — PX: ORIF HUMERUS FRACTURE: SHX2126

## 2023-09-20 LAB — CBC WITH DIFFERENTIAL/PLATELET
Abs Immature Granulocytes: 0.06 K/uL (ref 0.00–0.07)
Basophils Absolute: 0 K/uL (ref 0.0–0.1)
Basophils Relative: 0 %
Eosinophils Absolute: 0 K/uL (ref 0.0–0.5)
Eosinophils Relative: 0 %
HCT: 43.2 % (ref 39.0–52.0)
Hemoglobin: 14.4 g/dL (ref 13.0–17.0)
Immature Granulocytes: 1 %
Lymphocytes Relative: 13 %
Lymphs Abs: 1.4 K/uL (ref 0.7–4.0)
MCH: 28.6 pg (ref 26.0–34.0)
MCHC: 33.3 g/dL (ref 30.0–36.0)
MCV: 85.9 fL (ref 80.0–100.0)
Monocytes Absolute: 0.6 K/uL (ref 0.1–1.0)
Monocytes Relative: 6 %
Neutro Abs: 8.8 K/uL — ABNORMAL HIGH (ref 1.7–7.7)
Neutrophils Relative %: 80 %
Platelets: 318 K/uL (ref 150–400)
RBC: 5.03 MIL/uL (ref 4.22–5.81)
RDW: 13.9 % (ref 11.5–15.5)
WBC: 10.8 K/uL — ABNORMAL HIGH (ref 4.0–10.5)
nRBC: 0 % (ref 0.0–0.2)

## 2023-09-20 LAB — COMPREHENSIVE METABOLIC PANEL WITH GFR
ALT: 18 U/L (ref 0–44)
AST: 26 U/L (ref 15–41)
Albumin: 3.9 g/dL (ref 3.5–5.0)
Alkaline Phosphatase: 73 U/L (ref 38–126)
Anion gap: 11 (ref 5–15)
BUN: 9 mg/dL (ref 6–20)
CO2: 21 mmol/L — ABNORMAL LOW (ref 22–32)
Calcium: 9.2 mg/dL (ref 8.9–10.3)
Chloride: 105 mmol/L (ref 98–111)
Creatinine, Ser: 0.86 mg/dL (ref 0.61–1.24)
GFR, Estimated: >60 mL/min (ref >60)
Glucose, Bld: 177 mg/dL — ABNORMAL HIGH (ref 70–99)
Potassium: 4.5 mmol/L (ref 3.5–5.1)
Sodium: 137 mmol/L (ref 135–145)
Total Bilirubin: 0.8 mg/dL (ref 0.0–1.2)
Total Protein: 6.8 g/dL (ref 6.5–8.1)

## 2023-09-20 LAB — CBC
HCT: 46.1 % (ref 39.0–52.0)
Hemoglobin: 15.3 g/dL (ref 13.0–17.0)
MCH: 28.6 pg (ref 26.0–34.0)
MCHC: 33.2 g/dL (ref 30.0–36.0)
MCV: 86.2 fL (ref 80.0–100.0)
Platelets: 245 K/uL (ref 150–400)
RBC: 5.35 MIL/uL (ref 4.22–5.81)
RDW: 13.9 % (ref 11.5–15.5)
WBC: 4.9 K/uL (ref 4.0–10.5)
nRBC: 0 % (ref 0.0–0.2)

## 2023-09-20 SURGERY — REMOVAL, HARDWARE
Anesthesia: General | Site: Arm Upper | Laterality: Left

## 2023-09-20 MED ORDER — 0.9 % SODIUM CHLORIDE (POUR BTL) OPTIME
TOPICAL | Status: DC | PRN
  Administered 2023-09-20: 1000 mL
  Administered 2023-09-20: 500 mL

## 2023-09-20 MED ORDER — TOBRAMYCIN SULFATE 1.2 G IJ SOLR
INTRAMUSCULAR | Status: DC | PRN
  Administered 2023-09-20: 1.2 g via TOPICAL

## 2023-09-20 MED ORDER — ROCURONIUM BROMIDE 10 MG/ML (PF) SYRINGE
PREFILLED_SYRINGE | INTRAVENOUS | Status: AC
Start: 2023-09-20 — End: 2023-09-20
  Filled 2023-09-20: qty 10

## 2023-09-20 MED ORDER — METHOCARBAMOL 500 MG PO TABS: 500.0000 mg | ORAL_TABLET | Freq: Four times a day (QID) | ORAL | 0 refills | Status: AC | PRN

## 2023-09-20 MED ORDER — MIDAZOLAM HCL 2 MG/2ML IJ SOLN
INTRAMUSCULAR | Status: DC | PRN
  Administered 2023-09-20: 2 mg via INTRAVENOUS

## 2023-09-20 MED ORDER — ACETAMINOPHEN 10 MG/ML IV SOLN: 1000.0000 mg | Freq: Once | INTRAVENOUS | Status: DC | PRN

## 2023-09-20 MED ORDER — TOBRAMYCIN SULFATE 1.2 G IJ SOLR
INTRAMUSCULAR | Status: AC
  Filled 2023-09-20: qty 1.2

## 2023-09-20 MED ORDER — OXYCODONE-ACETAMINOPHEN 5-325 MG PO TABS: 1.0000 | ORAL_TABLET | ORAL | 0 refills | Status: AC | PRN

## 2023-09-20 MED ORDER — CHLORHEXIDINE GLUCONATE 0.12 % MT SOLN
15.0000 mL | Freq: Once | OROMUCOSAL | Status: AC
  Administered 2023-09-20: 15 mL via OROMUCOSAL
  Filled 2023-09-20: qty 15

## 2023-09-20 MED ORDER — DROPERIDOL 2.5 MG/ML IJ SOLN
0.6250 mg | Freq: Once | INTRAMUSCULAR | Status: AC | PRN
  Administered 2023-09-20: 0.625 mg via INTRAVENOUS

## 2023-09-20 MED ORDER — DEXAMETHASONE SODIUM PHOSPHATE 10 MG/ML IJ SOLN
INTRAMUSCULAR | Status: AC
Start: 2023-09-20 — End: 2023-09-20
  Filled 2023-09-20: qty 1

## 2023-09-20 MED ORDER — FENTANYL CITRATE (PF) 250 MCG/5ML IJ SOLN
INTRAMUSCULAR | Status: AC
  Filled 2023-09-20: qty 5

## 2023-09-20 MED ORDER — PROPOFOL 10 MG/ML IV BOLUS
INTRAVENOUS | Status: AC
  Filled 2023-09-20: qty 20

## 2023-09-20 MED ORDER — OXYCODONE HCL 5 MG PO TABS
5.0000 mg | ORAL_TABLET | Freq: Once | ORAL | Status: AC | PRN
  Administered 2023-09-20: 5 mg via ORAL

## 2023-09-20 MED ORDER — ONDANSETRON HCL 4 MG/2ML IJ SOLN
INTRAMUSCULAR | Status: DC | PRN
  Administered 2023-09-20: 4 mg via INTRAVENOUS

## 2023-09-20 MED ORDER — ORAL CARE MOUTH RINSE: 15.0000 mL | Freq: Once | OROMUCOSAL | Status: AC

## 2023-09-20 MED ORDER — HYDROGEN PEROXIDE 3 % EX SOLN
CUTANEOUS | Status: DC | PRN
Start: 2023-09-20 — End: 2023-09-20
  Administered 2023-09-20: 1

## 2023-09-20 MED ORDER — VANCOMYCIN HCL 1000 MG IV SOLR
INTRAVENOUS | Status: DC | PRN
Start: 2023-09-20 — End: 2023-09-20
  Administered 2023-09-20: 1000 mg

## 2023-09-20 MED ORDER — SUGAMMADEX SODIUM 200 MG/2ML IV SOLN
INTRAVENOUS | Status: DC | PRN
  Administered 2023-09-20: 200 mg via INTRAVENOUS

## 2023-09-20 MED ORDER — SUGAMMADEX SODIUM 200 MG/2ML IV SOLN
INTRAVENOUS | Status: AC
Start: 2023-09-20 — End: 2023-09-20
  Filled 2023-09-20: qty 2

## 2023-09-20 MED ORDER — LIDOCAINE 2% (20 MG/ML) 5 ML SYRINGE
INTRAMUSCULAR | Status: AC
  Filled 2023-09-20: qty 5

## 2023-09-20 MED ORDER — LACTATED RINGERS IV SOLN: INTRAVENOUS | Status: DC

## 2023-09-20 MED ORDER — VANCOMYCIN HCL 1000 MG IV SOLR
INTRAVENOUS | Status: AC
  Filled 2023-09-20: qty 20

## 2023-09-20 MED ORDER — FENTANYL CITRATE (PF) 250 MCG/5ML IJ SOLN
INTRAMUSCULAR | Status: DC | PRN
  Administered 2023-09-20: 50 ug via INTRAVENOUS
  Administered 2023-09-20 (×2): 100 ug via INTRAVENOUS

## 2023-09-20 MED ORDER — CEFAZOLIN SODIUM-DEXTROSE 2-4 GM/100ML-% IV SOLN
2.0000 g | INTRAVENOUS | Status: AC
  Administered 2023-09-20: 2 g via INTRAVENOUS
  Filled 2023-09-20: qty 100

## 2023-09-20 MED ORDER — LIDOCAINE HCL (CARDIAC) PF 100 MG/5ML IV SOSY
PREFILLED_SYRINGE | INTRAVENOUS | Status: DC | PRN
  Administered 2023-09-20: 100 mg via INTRAVENOUS

## 2023-09-20 MED ORDER — FENTANYL CITRATE (PF) 100 MCG/2ML IJ SOLN: 25.0000 ug | INTRAMUSCULAR | Status: DC | PRN

## 2023-09-20 MED ORDER — ROCURONIUM BROMIDE 10 MG/ML (PF) SYRINGE
PREFILLED_SYRINGE | INTRAVENOUS | Status: AC
  Filled 2023-09-20: qty 10

## 2023-09-20 MED ORDER — PROPOFOL 10 MG/ML IV BOLUS
INTRAVENOUS | Status: DC | PRN
  Administered 2023-09-20: 200 mg via INTRAVENOUS

## 2023-09-20 MED ORDER — ONDANSETRON HCL 4 MG/2ML IJ SOLN
INTRAMUSCULAR | Status: AC
Start: 2023-09-20 — End: 2023-09-20
  Filled 2023-09-20: qty 2

## 2023-09-20 MED ORDER — HYDROMORPHONE HCL 1 MG/ML IJ SOLN
INTRAMUSCULAR | Status: DC | PRN
  Administered 2023-09-20 (×3): .5 mg via INTRAVENOUS

## 2023-09-20 MED ORDER — OXYCODONE HCL 5 MG PO TABS
ORAL_TABLET | ORAL | Status: AC
  Filled 2023-09-20: qty 1

## 2023-09-20 MED ORDER — MIDAZOLAM HCL 2 MG/2ML IJ SOLN
INTRAMUSCULAR | Status: AC
  Filled 2023-09-20: qty 2

## 2023-09-20 MED ORDER — OXYCODONE HCL 5 MG/5ML PO SOLN: 5.0000 mg | Freq: Once | ORAL | Status: AC | PRN

## 2023-09-20 MED ORDER — HYDROMORPHONE HCL 1 MG/ML IJ SOLN
INTRAMUSCULAR | Status: AC
  Filled 2023-09-20: qty 0.5

## 2023-09-20 MED ORDER — STERILE WATER FOR IRRIGATION IR SOLN
Status: DC | PRN
  Administered 2023-09-20: 1000 mL

## 2023-09-20 MED ORDER — ACETAMINOPHEN 500 MG PO TABS
1000.0000 mg | ORAL_TABLET | Freq: Once | ORAL | Status: AC
  Administered 2023-09-20: 1000 mg via ORAL
  Filled 2023-09-20: qty 2

## 2023-09-20 MED ORDER — ROCURONIUM BROMIDE 10 MG/ML (PF) SYRINGE
PREFILLED_SYRINGE | INTRAVENOUS | Status: DC | PRN
  Administered 2023-09-20 (×2): 10 mg via INTRAVENOUS
  Administered 2023-09-20: 50 mg via INTRAVENOUS
  Administered 2023-09-20: 10 mg via INTRAVENOUS
  Administered 2023-09-20 (×2): 20 mg via INTRAVENOUS

## 2023-09-20 MED ORDER — DROPERIDOL 2.5 MG/ML IJ SOLN
INTRAMUSCULAR | Status: AC
  Filled 2023-09-20: qty 2

## 2023-09-20 MED ORDER — DEXAMETHASONE SODIUM PHOSPHATE 10 MG/ML IJ SOLN
INTRAMUSCULAR | Status: DC | PRN
  Administered 2023-09-20: 10 mg via INTRAVENOUS

## 2023-09-20 SURGICAL SUPPLY — 66 items
BAG COUNTER SPONGE SURGICOUNT (BAG) ×2 IMPLANT
BANDAGE ESMARK 6X9 LF (GAUZE/BANDAGES/DRESSINGS) ×2 IMPLANT
BNDG COHESIVE 4X5 TAN STRL LF (GAUZE/BANDAGES/DRESSINGS) ×2 IMPLANT
BNDG COHESIVE 6X5 TAN ST LF (GAUZE/BANDAGES/DRESSINGS) ×2 IMPLANT
BNDG ELASTIC 3INX 5YD STR LF (GAUZE/BANDAGES/DRESSINGS) IMPLANT
BNDG ELASTIC 4INX 5YD STR LF (GAUZE/BANDAGES/DRESSINGS) IMPLANT
BNDG ELASTIC 4X5.8 VLCR STR LF (GAUZE/BANDAGES/DRESSINGS) ×2 IMPLANT
BNDG ELASTIC 6INX 5YD STR LF (GAUZE/BANDAGES/DRESSINGS) ×2 IMPLANT
BNDG GAUZE DERMACEA FLUFF 4 (GAUZE/BANDAGES/DRESSINGS) ×4 IMPLANT
BRUSH SCRUB EZ PLAIN DRY (MISCELLANEOUS) ×4 IMPLANT
CHLORAPREP W/TINT 26 (MISCELLANEOUS) ×2 IMPLANT
COVER SURGICAL LIGHT HANDLE (MISCELLANEOUS) ×4 IMPLANT
CUFF TOURN SGL QUICK 18X4 (TOURNIQUET CUFF) IMPLANT
CUFF TRNQT CYL 24X4X16.5-23 (TOURNIQUET CUFF) IMPLANT
CUFF TRNQT CYL 34X4.125X (TOURNIQUET CUFF) IMPLANT
DERMABOND ADVANCED .7 DNX12 (GAUZE/BANDAGES/DRESSINGS) ×4 IMPLANT
DRAPE C-ARM 42X72 X-RAY (DRAPES) ×2 IMPLANT
DRAPE C-ARMOR (DRAPES) ×2 IMPLANT
DRAPE INCISE IOBAN 66X45 STRL (DRAPES) ×2 IMPLANT
DRAPE SURG 17X23 STRL (DRAPES) ×2 IMPLANT
DRAPE SURG ORHT 6 SPLT 77X108 (DRAPES) ×4 IMPLANT
DRAPE U-SHAPE 47X51 STRL (DRAPES) ×4 IMPLANT
DRSG ADAPTIC 3X8 NADH LF (GAUZE/BANDAGES/DRESSINGS) ×2 IMPLANT
DRSG MEPILEX POST OP 4X12 (GAUZE/BANDAGES/DRESSINGS) IMPLANT
DRSG MEPILEX POST OP 4X8 (GAUZE/BANDAGES/DRESSINGS) ×2 IMPLANT
ELECTRODE REM PT RTRN 9FT ADLT (ELECTROSURGICAL) ×2 IMPLANT
EVACUATOR 1/8 PVC DRAIN (DRAIN) IMPLANT
GAUZE PAD ABD 8X10 STRL (GAUZE/BANDAGES/DRESSINGS) ×2 IMPLANT
GAUZE SPONGE 4X4 12PLY STRL (GAUZE/BANDAGES/DRESSINGS) ×2 IMPLANT
GLOVE BIO SURGEON STRL SZ 6.5 (GLOVE) ×6 IMPLANT
GLOVE BIO SURGEON STRL SZ7.5 (GLOVE) ×8 IMPLANT
GLOVE BIOGEL PI IND STRL 6.5 (GLOVE) ×2 IMPLANT
GLOVE BIOGEL PI IND STRL 7.5 (GLOVE) ×2 IMPLANT
GOWN STRL REUS W/ TWL LRG LVL3 (GOWN DISPOSABLE) ×4 IMPLANT
KIT BASIN OR (CUSTOM PROCEDURE TRAY) ×2 IMPLANT
KIT TURNOVER KIT B (KITS) ×2 IMPLANT
MANIFOLD NEPTUNE II (INSTRUMENTS) ×2 IMPLANT
NDL 22X1.5 STRL (OR ONLY) (MISCELLANEOUS) IMPLANT
NDL HYPO 25X1 1.5 SAFETY (NEEDLE) ×2 IMPLANT
NEEDLE 22X1.5 STRL (OR ONLY) (MISCELLANEOUS) IMPLANT
NEEDLE HYPO 25X1 1.5 SAFETY (NEEDLE) ×1 IMPLANT
NS IRRIG 1000ML POUR BTL (IV SOLUTION) ×2 IMPLANT
PACK ORTHO EXTREMITY (CUSTOM PROCEDURE TRAY) ×2 IMPLANT
PAD ARMBOARD POSITIONER FOAM (MISCELLANEOUS) ×4 IMPLANT
PAD CAST 3X4 CTTN HI CHSV (CAST SUPPLIES) IMPLANT
PAD CAST 4YDX4 CTTN HI CHSV (CAST SUPPLIES) IMPLANT
PADDING CAST COTTON 6X4 STRL (CAST SUPPLIES) ×6 IMPLANT
SPONGE T-LAP 18X18 ~~LOC~~+RFID (SPONGE) ×2 IMPLANT
STAPLER SKIN PROX 35W (STAPLE) ×2 IMPLANT
STOCKINETTE IMPERVIOUS LG (DRAPES) ×2 IMPLANT
STRIP CLOSURE SKIN 1/2X4 (GAUZE/BANDAGES/DRESSINGS) IMPLANT
SUCTION TUBE FRAZIER 10FR DISP (SUCTIONS) ×2 IMPLANT
SUT ETHILON 3 0 PS 1 (SUTURE) ×4 IMPLANT
SUT MNCRL AB 3-0 PS2 18 (SUTURE) ×2 IMPLANT
SUT MON AB 2-0 CT1 36 (SUTURE) ×2 IMPLANT
SUT PDS AB 2-0 CT1 27 (SUTURE) IMPLANT
SUT VIC AB 0 CT1 27XBRD ANBCTR (SUTURE) ×4 IMPLANT
SUT VIC AB 2-0 CT1 TAPERPNT 27 (SUTURE) ×4 IMPLANT
SYR CONTROL 10ML LL (SYRINGE) ×2 IMPLANT
TOWEL GREEN STERILE (TOWEL DISPOSABLE) ×6 IMPLANT
TOWEL GREEN STERILE FF (TOWEL DISPOSABLE) ×4 IMPLANT
TRAY FOLEY MTR SLVR 16FR STAT (SET/KITS/TRAYS/PACK) IMPLANT
TUBE CONNECTING 12X1/4 (SUCTIONS) ×2 IMPLANT
UNDERPAD 30X36 HEAVY ABSORB (UNDERPADS AND DIAPERS) ×2 IMPLANT
WATER STERILE IRR 1000ML POUR (IV SOLUTION) ×4 IMPLANT
YANKAUER SUCT BULB TIP NO VENT (SUCTIONS) ×2 IMPLANT

## 2023-09-20 NOTE — Transfer of Care (Signed)
 Immediate Anesthesia Transfer of Care Note  Patient: [name].  Procedure(s) Performed: REMOVAL, HARDWARE HUMERUS, LEFT (Left: Arm Upper) OPEN REDUCTION INTERNAL FIXATION (ORIF) HUMERAL SHAFT FRACTURE, LEFT (Left: Arm Upper)  Patient Location: PACU  Anesthesia Type:General  Level of Consciousness: drowsy and patient cooperative  Airway & Oxygen Therapy: Patient Spontanous Breathing and Patient connected to nasal cannula oxygen  Post-op Assessment: Report given to RN and Post -op Vital signs reviewed and stable  Post vital signs: Reviewed and stable  Last Vitals:  Vitals Value Taken Time  BP 139/85 09/20/23 10:51  Temp    Pulse 77 09/20/23 10:54  Resp 12 09/20/23 10:54  SpO2 98 % 09/20/23 10:54  Vitals shown include unfiled device data.  Last Pain:  Vitals:   09/20/23 0607  TempSrc:   PainSc: 0-No pain      Patients Stated Pain Goal: 0 (09/20/23 0553)  Complications: No notable events documented.
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
## 2023-09-20 NOTE — ED Provider Triage Note (Signed)
 Emergency Medicine Provider Triage Evaluation Note  Dedrick D Jenel Raddle. , a 28 y.o. male  was evaluated in triage.  Pt complains of bleeding from his surgical site.  States he had hardware removed from his left arm earlier today and has noticed blood seeping through the bandage.  He denies any dizziness.  Review of Systems  Positive: As above Negative: As above  Physical Exam  BP (!) 140/120 (BP Location: Right Arm)   Pulse 70   Temp 97.9 F (36.6 C)   Resp 17   Ht 6' 2 (1.88 m)   Wt 81.6 kg   SpO2 93%   BMI 23.10 kg/m  Gen:   Awake, no distress   Resp:  Normal effort  MSK:   Moves extremities without difficulty  Other:  Blood seeping through the surgical dressing and arm  Medical Decision Making  Medically screening exam initiated at 6:44 PM.  Appropriate orders placed.  Nasean D Jenel Raddle. was informed that the remainder of the evaluation will be completed by another provider, this initial triage assessment does not replace that evaluation, and the importance of remaining in the ED until their evaluation is complete.     Veta Palma, PA-C 09/20/23 1907

## 2023-09-20 NOTE — ED Triage Notes (Signed)
 Patient arrives ambulatory by POV c/o bleeding after left arm surgery earlier today. States they took hardware out of his arm. Took pain meds about 5pm today.

## 2023-09-20 NOTE — Progress Notes (Signed)
 Orthopedic Tech Progress Note Patient Details:  Jeremy George 22-Nov-1995 [id number] Arm sling delivered to bedside in PACU Patient ID: Jeremy JONETTA Jenel Mickey., male   DOB: [birth year], 28 y.o.   MRN: [id number]  Jeremy George 09/20/2023, 11:02 AM

## 2023-09-20 NOTE — Interval H&P Note (Signed)
 History and Physical Interval Note:  09/20/2023 7:15 AM  Jeremy George.  has presented today for surgery, with the diagnosis of Left humerus infection/nonunion.  The various methods of treatment have been discussed with the patient and family. After consideration of risks, benefits and other options for treatment, the patient has consented to  Procedure(s): REMOVAL, HARDWARE HUMERUS (Left) OPEN REDUCTION INTERNAL FIXATION (ORIF) HUMERAL SHAFT FRACTURE (Left) as a surgical intervention.  The patient's history has been reviewed, patient examined, no change in status, stable for surgery.  I have reviewed the patient's chart and labs.  Questions were answered to the patient's satisfaction.     Jeremy George

## 2023-09-20 NOTE — Anesthesia Procedure Notes (Signed)
 Procedure Name: Intubation Date/Time: 09/20/2023 7:30 AM  Performed by: Cindie Donald CROME, CRNAPre-anesthesia Checklist: Patient identified, Emergency Drugs available, Suction available and Patient being monitored Patient Re-evaluated:Patient Re-evaluated prior to induction Oxygen Delivery Method: Circle System Utilized Preoxygenation: Pre-oxygenation with 100% oxygen Induction Type: IV induction Ventilation: Mask ventilation without difficulty Laryngoscope Size: Mac and 4 Grade View: Grade II Tube type: Oral Tube size: 7.5 mm Number of attempts: 1 Airway Equipment and Method: Stylet and Bite block Placement Confirmation: ETT inserted through vocal cords under direct vision, positive ETCO2 and breath sounds checked- equal and bilateral Secured at: 23 cm Tube secured with: Tape Dental Injury: Teeth and Oropharynx as per pre-operative assessment

## 2023-09-20 NOTE — ED Notes (Signed)
 Upon getting to bed, pt and mom report they just heard back from Dr. Kendal. Doc told him he does not want him to unwrap in ED. Instructions given over the phone for what he can do at home. Pt leaving at this time.

## 2023-09-20 NOTE — Progress Notes (Signed)
 Dr. Kendal and Dr. Treen made aware that the patient reports he's had a toothache on his right lower back tooth for a couple of weeks.

## 2023-09-20 NOTE — Anesthesia Postprocedure Evaluation (Signed)
 Anesthesia Post Note  Patient: Jeremy George.  Procedure(s) Performed: REMOVAL, HARDWARE HUMERUS, LEFT (Left: Arm Upper) OPEN REDUCTION INTERNAL FIXATION (ORIF) HUMERAL SHAFT FRACTURE, LEFT (Left: Arm Upper)     Patient location during evaluation: PACU Anesthesia Type: General Level of consciousness: awake and alert Pain management: pain level controlled Vital Signs Assessment: post-procedure vital signs reviewed and stable Respiratory status: spontaneous breathing, nonlabored ventilation, respiratory function stable and patient connected to nasal cannula oxygen Cardiovascular status: blood pressure returned to baseline and stable Postop Assessment: no apparent nausea or vomiting Anesthetic complications: no   No notable events documented.  Last Vitals:  Vitals:   09/20/23 1115 09/20/23 1121  BP: (!) 142/97 (!) 142/97  Pulse: 62 70  Resp: 12 12  Temp:  36.7 C  SpO2: 95% 97%    Last Pain:  Vitals:   09/20/23 1121  TempSrc:   PainSc: 4                  Thom JONELLE Peoples

## 2023-09-20 NOTE — Discharge Instructions (Addendum)
 [facility] Discharge Instructions   General Discharge Instructions  WEIGHT BEARING STATUS: Non-weightbearing left arm  RANGE OF MOTION/ACTIVITY: Wear sling for the first 2 weeks until follow-up.  Okay to remove sling for bathing and dressing  Wound Care: You may remove your surgical dressing on postoperative day #3 (Saturday, 09/23/2023). Incisions can be left open to air if there is no drainage. Once the incision is completely dry and without drainage, it may be left open to air out.  Showering may begin postoperative day #4 (Sunday, 09/24/2023).  Clean incision gently with soap and water .  DVT/PE prophylaxis: None  Diet: as you were eating previously.  Can use over the counter stool softeners and bowel preparations, such as Miralax , to help with bowel movements.  Narcotics can be constipating.  Be sure to drink plenty of fluids  PAIN MEDICATION USE AND EXPECTATIONS  You have likely been given narcotic medications to help control your pain.  After a traumatic event that results in an fracture (broken bone) with or without surgery, it is ok to use narcotic pain medications to help control one's pain.  We understand that everyone responds to pain differently and each individual patient will be evaluated on a regular basis for the continued need for narcotic medications. Ideally, narcotic medication use should last no more than 6-8 weeks (coinciding with fracture healing).   As a patient it is your responsibility as well to monitor narcotic medication use and report the amount and frequency you use these medications when you come to your office visit.   We would also advise that if you are using narcotic medications, you should take a dose prior to therapy to maximize you participation.  IF YOU ARE ON NARCOTIC MEDICATIONS IT IS NOT PERMISSIBLE TO OPERATE A MOTOR VEHICLE (MOTORCYCLE/CAR/TRUCK/MOPED) OR HEAVY MACHINERY DO NOT MIX NARCOTICS WITH OTHER CNS (CENTRAL NERVOUS SYSTEM)  DEPRESSANTS SUCH AS ALCOHOL  POST-OPERATIVE OPIOID TAPER INSTRUCTIONS: It is important to wean off of your opioid medication as soon as possible. If you do not need pain medication after your surgery it is ok to stop day one. Opioids include: Codeine, Hydrocodone (Norco, Vicodin), Oxycodone (Percocet, oxycontin ) and hydromorphone  amongst others.  Long term and even short term use of opiods can cause: Increased pain response Dependence Constipation Depression Respiratory depression And more.  Withdrawal symptoms can include Flu like symptoms Nausea, vomiting And more Techniques to manage these symptoms Hydrate well Eat regular healthy meals Stay active Use relaxation techniques(deep breathing, meditating, yoga) Do Not substitute Alcohol to help with tapering If you have been on opioids for less than two weeks and do not have pain than it is ok to stop all together.  Plan to wean off of opioids This plan should start within one week post op of your fracture surgery  Maintain the same interval or time between taking each dose and first decrease the dose.  Cut the total daily intake of opioids by one tablet each day Next start to increase the time between doses. The last dose that should be eliminated is the evening dose.    STOP SMOKING OR USING NICOTINE  PRODUCTS!!!!  As discussed nicotine  severely impairs your body's ability to heal surgical and traumatic wounds but also impairs bone healing.  Wounds and bone heal by forming microscopic blood vessels (angiogenesis) and nicotine  is a vasoconstrictor (essentially, shrinks blood vessels).  Therefore, if vasoconstriction occurs to these microscopic blood vessels they essentially disappear and are unable to deliver necessary nutrients to the healing tissue.  This is one modifiable factor that you can do to dramatically increase your chances of healing your injury.  (This means no smoking, no nicotine  gum, patches, etc)  ICE AND ELEVATE  INJURED/OPERATIVE EXTREMITY  Using ice and elevating the injured extremity above your heart can help with swelling and pain control.  Icing in a pulsatile fashion, such as 20 minutes on and 20 minutes off, can be followed.    Do not place ice directly on skin. Make sure there is a barrier between to skin and the ice pack.    Using frozen items such as frozen peas works well as the conform nicely to the are that needs to be iced.  USE AN ACE WRAP OR TED HOSE FOR SWELLING CONTROL  In addition to icing and elevation, Ace wraps or TED hose are used to help limit and resolve swelling.  It is recommended to use Ace wraps or TED hose until you are informed to stop.    When using Ace Wraps start the wrapping distally (farthest away from the body) and wrap proximally (closer to the body)   Example: If you had surgery on your leg or thing and you do not have a splint on, start the ace wrap at the toes and work your way up to the thigh        If you had surgery on your upper extremity and do not have a splint on, start the ace wrap at your fingers and work your way up to the upper arm    CALL THE OFFICE FOR MEDICATION REFILLS OR WITH ANY QUESTIONS/CONCERNS: [phone]   VISIT OUR WEBSITE FOR ADDITIONAL INFORMATION: [email or web address]    Discharge Wound Care Instructions  Do NOT apply any ointments, solutions or lotions to pin sites or surgical wounds.  These prevent needed drainage and even though solutions like hydrogen peroxide  kill bacteria, they also damage cells lining the pin sites that help fight infection.  Applying lotions or ointments can keep the wounds moist and can cause them to breakdown and open up as well. This can increase the risk for infection. When in doubt call the office.  Surgical incisions should be dressed daily.  If any drainage is noted, use one layer of adaptic or Mepitel, then gauze, Kerlix, and an ace wrap. - These dressing supplies should be available at local medical  supply stores ([facility], [facility], etc) as well as Insurance claims handler ([facility], [facility], [place], etc)  Once the incision is completely dry and without drainage, it may be left open to air out.  Showering may begin 36-48 hours later.  Cleaning gently with soap and water .  Traumatic wounds should be dressed daily as well.    One layer of adaptic, gauze, Kerlix, then ace wrap.  The adaptic can be discontinued once the draining has ceased    If you have a wet to dry dressing: wet the gauze with saline the squeeze as much saline out so the gauze is moist (not soaking wet), place moistened gauze over wound, then place a dry gauze over the moist one, followed by Kerlix wrap, then ace wrap.    Call office for the following: Temperature greater than 101F Persistent nausea and vomiting Severe uncontrolled pain Redness, tenderness, or signs of infection (pain, swelling, redness, odor or green/yellow discharge around the site) Difficulty breathing, headache or visual disturbances Hives Persistent dizziness or light-headedness Extreme fatigue Any other questions or concerns you may have after discharge  In an emergency, call 911  or go to an Emergency Department at a nearby hospital  OTHER HELPFUL INFORMATION  If you had a block, it will wear off between 8-24 hrs postop typically.  This is period when your pain may go from nearly zero to the pain you would have had postop without the block.  This is an abrupt transition but nothing dangerous is happening.  You may take an extra dose of narcotic when this happens.  You should wean off your narcotic medicines as soon as you are able.  Most patients will be off or using minimal narcotics before their first postop appointment.   We suggest you use the pain medication the first night prior to going to bed, in order to ease any pain when the anesthesia wears off. You should avoid taking pain medications on an empty stomach as it will make you  nauseous.  Do not drink alcoholic beverages or take illicit drugs when taking pain medications.  In most states it is against the law to drive while you are in a splint or sling.  And certainly against the law to drive while taking narcotics.  You may return to work/school in the next couple of days when you feel up to it.   Pain medication may make you constipated.  Below are a few solutions to try in this order: Decrease the amount of pain medication if you aren't having pain. Drink lots of decaffeinated fluids. Drink prune juice and/or each dried prunes  If the first 3 don't work start with additional solutions Take Colace - an over-the-counter stool softener Take Senokot - an over-the-counter laxative Take Miralax  - a stronger over-the-counter laxative

## 2023-09-21 ENCOUNTER — Encounter (HOSPITAL_COMMUNITY): Payer: Self-pay | Admitting: Student

## 2023-09-25 LAB — AEROBIC/ANAEROBIC CULTURE W GRAM STAIN (SURGICAL/DEEP WOUND): Gram Stain: NONE SEEN

## 2023-10-03 DIAGNOSIS — Z419 Encounter for procedure for purposes other than remedying health state, unspecified: Secondary | ICD-10-CM | POA: Diagnosis not present

## 2023-10-03 DIAGNOSIS — S42412D Displaced simple supracondylar fracture without intercondylar fracture of left humerus, subsequent encounter for fracture with routine healing: Secondary | ICD-10-CM | POA: Diagnosis not present

## 2023-11-03 DIAGNOSIS — Z419 Encounter for procedure for purposes other than remedying health state, unspecified: Secondary | ICD-10-CM | POA: Diagnosis not present

## 2023-11-13 DIAGNOSIS — S42412D Displaced simple supracondylar fracture without intercondylar fracture of left humerus, subsequent encounter for fracture with routine healing: Secondary | ICD-10-CM | POA: Diagnosis not present

## 2023-11-13 DIAGNOSIS — M1612 Unilateral primary osteoarthritis, left hip: Secondary | ICD-10-CM | POA: Diagnosis not present

## 2023-11-14 ENCOUNTER — Other Ambulatory Visit: Payer: Self-pay | Admitting: Student

## 2023-11-14 DIAGNOSIS — M1612 Unilateral primary osteoarthritis, left hip: Secondary | ICD-10-CM

## 2023-11-21 ENCOUNTER — Other Ambulatory Visit

## 2023-11-22 ENCOUNTER — Ambulatory Visit
Admission: RE | Admit: 2023-11-22 | Discharge: 2023-11-22 | Disposition: A | Discharge status: Home / Self Care | Source: Ambulatory Visit | Attending: Student | Admitting: Student

## 2023-11-22 DIAGNOSIS — M25552 Pain in left hip: Secondary | ICD-10-CM | POA: Diagnosis not present

## 2023-11-22 DIAGNOSIS — M1612 Unilateral primary osteoarthritis, left hip: Secondary | ICD-10-CM

## 2023-11-22 MED ORDER — METHYLPREDNISOLONE ACETATE 40 MG/ML INJ SUSP (RADIOLOG
80.0000 mg | Freq: Once | INTRAMUSCULAR | Status: AC
Start: 2023-11-22 — End: 2023-11-22
  Administered 2023-11-22: 80 mg via INTRA_ARTICULAR

## 2023-11-22 MED ORDER — IOPAMIDOL (ISOVUE-M 200) INJECTION 41%
1.0000 mL | Freq: Once | INTRAMUSCULAR | Status: AC
Start: 2023-11-22 — End: 2023-11-22
  Administered 2023-11-22: 1 mL via INTRA_ARTICULAR

## 2023-11-22 NOTE — Procedures (Signed)
 Radiology Procedure Note  Risks and benefits of joint injection were discussed with the patient including, but not limited to bleeding, infection, damage to adjacent structures, allergic reaction, and extraarticular injection.    All of the patient's questions were answered, patient is agreeable to proceed. Consent signed and in chart.  A timeout was performed with all members of the team prior to start of the procedure. Correct patient and correct procedure was confirmed. Allergies were reviewed.   PROCEDURE SUMMARY:  Successful fluoro guided left hip steroid injection. No immediate complications.  Patient tolerated well.   EBL = trace  Please see full dictation in imaging section of Epic for procedure details.   Camp Gopal H Kindra Bickham PA-C 11/22/2023 11:48 AM

## 2024-01-08 DIAGNOSIS — S42412D Displaced simple supracondylar fracture without intercondylar fracture of left humerus, subsequent encounter for fracture with routine healing: Secondary | ICD-10-CM | POA: Diagnosis not present

## 2024-01-08 DIAGNOSIS — M25622 Stiffness of left elbow, not elsewhere classified: Secondary | ICD-10-CM | POA: Diagnosis not present
